# Patient Record
Sex: Female | Born: 1935
Health system: Southern US, Community
[De-identification: ages and names within clinical notes are randomized; demographics above are authoritative.]

## PROBLEM LIST (undated history)

## (undated) DIAGNOSIS — B029 Zoster without complications: Secondary | ICD-10-CM

## (undated) DIAGNOSIS — I350 Nonrheumatic aortic (valve) stenosis: Secondary | ICD-10-CM

## (undated) DIAGNOSIS — N3281 Overactive bladder: Secondary | ICD-10-CM

## (undated) DIAGNOSIS — M8580 Other specified disorders of bone density and structure, unspecified site: Secondary | ICD-10-CM

## (undated) DIAGNOSIS — E785 Hyperlipidemia, unspecified: Secondary | ICD-10-CM

## (undated) DIAGNOSIS — I4891 Unspecified atrial fibrillation: Secondary | ICD-10-CM

## (undated) DIAGNOSIS — K219 Gastro-esophageal reflux disease without esophagitis: Secondary | ICD-10-CM

## (undated) DIAGNOSIS — R7301 Impaired fasting glucose: Secondary | ICD-10-CM

## (undated) DIAGNOSIS — R0989 Other specified symptoms and signs involving the circulatory and respiratory systems: Secondary | ICD-10-CM

## (undated) DIAGNOSIS — R42 Dizziness and giddiness: Secondary | ICD-10-CM

## (undated) DIAGNOSIS — I809 Phlebitis and thrombophlebitis of unspecified site: Secondary | ICD-10-CM

## (undated) DIAGNOSIS — I8390 Asymptomatic varicose veins of unspecified lower extremity: Secondary | ICD-10-CM

## (undated) DIAGNOSIS — I10 Essential (primary) hypertension: Secondary | ICD-10-CM

## (undated) DIAGNOSIS — I493 Ventricular premature depolarization: Secondary | ICD-10-CM

## (undated) HISTORY — DX: Hyperlipidemia, unspecified: E78.5

## (undated) HISTORY — DX: Phlebitis and thrombophlebitis of unspecified site: I80.9

## (undated) HISTORY — PX: HAND SURGERY: SHX662

## (undated) HISTORY — DX: Overactive bladder: N32.81

## (undated) HISTORY — DX: Gastro-esophageal reflux disease without esophagitis: K21.9

## (undated) HISTORY — DX: Unspecified atrial fibrillation: I48.91

## (undated) HISTORY — DX: Zoster without complications: B02.9

## (undated) HISTORY — DX: Asymptomatic varicose veins of unspecified lower extremity: I83.90

## (undated) HISTORY — PX: HEMORRHOID SURGERY: SHX153

## (undated) HISTORY — DX: Other specified symptoms and signs involving the circulatory and respiratory systems: R09.89

## (undated) HISTORY — PX: FOOT SURGERY: SHX648

## (undated) HISTORY — DX: Nonrheumatic aortic (valve) stenosis: I35.0

## (undated) HISTORY — DX: Dizziness and giddiness: R42

## (undated) HISTORY — DX: Essential (primary) hypertension: I10

## (undated) HISTORY — DX: Gilbert syndrome: E80.4

## (undated) HISTORY — DX: Other specified disorders of bone density and structure, unspecified site: M85.80

## (undated) HISTORY — DX: Impaired fasting glucose: R73.01

## (undated) HISTORY — DX: Ventricular premature depolarization: I49.3

---

## 1961-01-09 HISTORY — PX: ABDOMINAL HYSTERECTOMY: SHX81

## 2004-10-19 HISTORY — PX: US ECHOCARDIOGRAPHY: HXRAD669

## 2005-07-20 HISTORY — PX: US ECHOCARDIOGRAPHY: HXRAD669

## 2005-07-25 ENCOUNTER — Inpatient Hospital Stay (HOSPITAL_BASED_OUTPATIENT_CLINIC_OR_DEPARTMENT_OTHER): Admission: RE | Admit: 2005-07-25 | Discharge: 2005-07-25 | Payer: Self-pay | Admitting: Cardiology

## 2005-07-25 HISTORY — PX: CARDIAC CATHETERIZATION: SHX172

## 2005-08-01 ENCOUNTER — Inpatient Hospital Stay (HOSPITAL_COMMUNITY): Admission: RE | Admit: 2005-08-01 | Discharge: 2005-08-11 | Payer: Self-pay | Admitting: Surgery

## 2005-08-01 HISTORY — PX: AORTIC VALVE REPLACEMENT: SHX41

## 2005-08-02 ENCOUNTER — Encounter (INDEPENDENT_AMBULATORY_CARE_PROVIDER_SITE_OTHER): Payer: Self-pay | Admitting: *Deleted

## 2005-11-21 HISTORY — PX: US ECHOCARDIOGRAPHY: HXRAD669

## 2008-01-10 HISTORY — PX: VARICOSE VEIN SURGERY: SHX832

## 2008-04-14 HISTORY — PX: US ECHOCARDIOGRAPHY: HXRAD669

## 2008-09-09 DIAGNOSIS — R0989 Other specified symptoms and signs involving the circulatory and respiratory systems: Secondary | ICD-10-CM

## 2008-09-09 HISTORY — DX: Other specified symptoms and signs involving the circulatory and respiratory systems: R09.89

## 2009-10-18 ENCOUNTER — Ambulatory Visit: Payer: Self-pay | Admitting: Cardiology

## 2010-03-02 ENCOUNTER — Other Ambulatory Visit: Payer: Self-pay | Admitting: Internal Medicine

## 2010-03-02 DIAGNOSIS — Z1231 Encounter for screening mammogram for malignant neoplasm of breast: Secondary | ICD-10-CM

## 2010-05-03 ENCOUNTER — Ambulatory Visit
Admission: RE | Admit: 2010-05-03 | Discharge: 2010-05-03 | Disposition: A | Discharge status: Home / Self Care | Payer: Medicare Other | Source: Ambulatory Visit | Attending: Internal Medicine | Admitting: Internal Medicine

## 2010-05-03 DIAGNOSIS — Z1231 Encounter for screening mammogram for malignant neoplasm of breast: Secondary | ICD-10-CM

## 2010-05-27 NOTE — H&P (Signed)
NAME:  Megan Pitts, Megan Pitts               ACCOUNT NO.:  [id number]   MEDICAL RECORD NO.:  [id number]           PATIENT TYPE:   LOCATION:                                 FACILITY:   PHYSICIAN:  Peter M. Swaziland, M.D.  DATE OF BIRTH:  1927-04-05   DATE OF ADMISSION:  07/25/2005  DATE OF DISCHARGE:                                HISTORY & PHYSICAL   HISTORY OF PRESENT ILLNESS:  Ms. Fariss is a 77 year old white female.  She  was initially evaluated in October 2006 for a new murmur.  She was found at  that time to have moderately severe aortic stenosis.  She was asymptomatic  at that time and we recommended continued follow-up.  However over the past  month, she has experienced symptoms of increasing dyspnea with exertion.  She has had no orthopnea, PND or edema.  Her symptoms have been particularly  noticeable over the past 2 weeks with dyspnea on walking or doing her house  chores. She also has been experiencing some chest tightness in her mid chest  radiating to her left arm and into her neck. With this, she has had a lot of  belching and she feels that her symptoms were primarily related to acid  reflux.  We did do a follow-up echocardiogram which showed progression of  her aortic stenosis which is now severe. Her peak gradient was 85 mmHg, mean  gradient of 45 mmHg and aortic valve area 0.8 cm2.  She has mild concentric  LVH with normal systolic function and mild to moderate aortic insufficiency.  Because of her progressive symptoms and findings on echo, we have  recommended cardiac catheterization with anticipation of needing aortic  valve replacement.   PAST MEDICAL HISTORY:  1.  Aortic stenosis.  2.  Hypertension.  3.  Hypercholesterolemia.  She has had a prior hysterectomy at age 74.  She      has had prior hemorrhoid surgery.  She has no known allergies.   CURRENT MEDICATIONS:  1.  Hyzaar 100/25 mg per day.  2.  Zocor 40 mg per day.  3.  Atenolol 25 mg per day.  4.  Aspirin 81 mg  daily.  5.  Nexium 40 mg per day.   SOCIAL HISTORY:  She is retired.  She previously worked for the [employer].  She is married. She denies tobacco or alcohol use.  She  has two children in good health.   FAMILY HISTORY:  Father died at age 77 with Alzheimer's disease.  Mother  died at age 60 with pneumonia.  One brother died at age 75 with some type of  blood disorder.  Two sisters are in good health.   REVIEW OF SYSTEMS:  No history of TIA or stroke.  No bleeding disorder.  No  claudication.  She denies any edema, orthopnea or PND.  She has had no  recent bowel or bladder complaints.   PHYSICAL EXAMINATION:  GENERAL:  The patient is a pleasant white female in  no distress.  VITAL SIGNS:  Weight is 155, blood pressure is  130/80, pulse 60 and regular.  Respirations are normal.  HEENT:  Normocephalic, atraumatic.  Pupils equal, round, reactive to light  and accommodation.  Extraocular movements are full. Oropharynx is clear.  NECK:  Supple without JVD, adenopathy, or thyromegaly. Carotid upstrokes  were delayed and diminished.  LUNGS:  Clear.  CARDIAC:  Exam reveals a harsh grade 3/6 systolic murmur heard best in the  aortic area radiating to the carotids.  There is no diastolic murmur or S3.  ABDOMEN:  Soft, nontender without masses or hepatosplenomegaly. Femoral and  pedal pulses were 2+ and symmetric.  She has no edema.  NEUROLOGIC:  Exam is nonfocal.   LABORATORY DATA:  ECG shows normal sinus rhythm with minor nonspecific ST  abnormality.  Chest x-ray is normal.   IMPRESSION:  1.  Severe aortic stenosis now symptomatic.  2.  Hypertension.  3.  Hypercholesterolemia.  4.  Possible symptoms of acid reflux disease.   PLAN:  Will undergo diagnostic right and left heart catheterization,  coronary angiography with further therapy pending these results.           ______________________________  Peter M. Swaziland, M.D.     PMJ/MEDQ  D:  07/20/2005  T:   07/20/2005  Job:  [id number]   cc:   Kari Baars, M.D.  Fax: [phone]

## 2010-05-27 NOTE — Op Note (Signed)
Megan Pitts, Megan Pitts                   ACCOUNT NO.:  [id number]   MEDICAL RECORD NO.:  [id number]          PATIENT TYPE:  INP   LOCATION:  2307                         FACILITY:  [facility]   PHYSICIAN:  Evelene Croon, M.D.     DATE OF BIRTH:  [birth year]   DATE OF PROCEDURE:  08/01/2005  DATE OF DISCHARGE:                                 OPERATIVE REPORT   PREOPERATIVE DIAGNOSIS:  Critical aortic stenosis.   POSTOPERATIVE DIAGNOSIS:  Critical aortic stenosis.   OPERATIVE PROCEDURE:  Median sternotomy, extracorporeal circulation, aortic  valve replacement using a 23 mm Toronto stentless porcine aortic valve.   ATTENDING SURGEON:  Evelene Croon, M.D.   ASSISTANT:  Salvatore Decent. Dorris Fetch, M.D.   ANESTHESIA:  General endotracheal.   CLINICAL HISTORY:  The patient is a 77 year old woman with history of aortic  stenosis that has been followed with serial echocardiograms.  She now  presents with progressive symptoms of shortness of breath and chest pain  with minimal exertion and echocardiogram has shown critical aortic stenosis.  She underwent catheterization which showed mild, nonobstructive coronary  disease.  Left ventricular function was well preserved.  Her aortic valve  area has been calculated at 0.4 cm2.  After review of the studies and  examination of the patient, I felt that proceeding with urgent aortic valve  replacement was the best treatment to prevent any complications of critical  aortic stenosis including sudden death.  The patient was admitted to the  hospital from the office and plans made to do surgery today.  I discussed  the operative procedure with the patient and her husband including use of a  tissue valve given her age of 37 years.  I discussed alternatives, benefits,  and risks of surgery including bleeding, blood transfusion, infection,  stroke, myocardial infarction, heart block requiring permanent pacemaker,  organ failure, and death.  She understood and agreed to  proceed.   OPERATIVE PROCEDURE:  The patient was taken to the operating room and placed  on the table in supine position.  After induction of general endotracheal  anesthesia, a Foley catheter was placed in the bladder using sterile  technique.  Then, the chest, abdomen and both lower extremities were prepped  and draped in usual sterile manner.  Transesophageal echocardiogram was  performed by anesthesiology.  This showed critical calcific aortic stenosis.  Left ventricular function appeared well preserved.  There was no significant  mitral regurgitation.   Then, the chest was opened through a median sternotomy incision and the  pericardium opened in the midline.  Examination of the heart showed good  ventricular contractility.  The transesophageal echocardiogram showed  moderate left ventricular hypertrophy.  The ascending aorta was normal size  for her body with her body surface area of 1.8.   The patient was heparinized and when an adequate activated clotting time was  achieved, the distal ascending aorta was cannulated using a 20-French aortic  cannula for arterial in flow.  Venous outflow was achieved using a two-stage  venous cannula through the right atrial appendage.  An antegrade  cardioplegia  and vent cannula was inserted in the aortic root.  A left  ventricular vent was placed through the right superior pulmonary vein.  A  retrograde cardioplegic cannula was inserted through the right atrium and  the coronary sinus.   The patient was placed on cardiopulmonary bypass.  The aorta was  crossclamped and 500 mL of cold blood antegrade cardioplegia was  administered in the aortic root with quick arrest the heart.  Systemic  hypothermia to 20 degrees centigrade and topical hypothermic with iced  saline was used.  A temperature probe was placed in the septum and an  insulating pad in the pericardium.  Additional doses of retrograde  cardioplegia were given at about 20 minute  intervals throughout the  procedure to maintain myocardial temperature around 10 degrees centigrade.   The aorta was then opened just above the sinotubular junction.  Examination  of the native valve showed that there were three leaflets with marked  calcification and immobility.  The coronary ostia were identified and were  not involved in this calcification.  The coronary ostia were in their normal  location and were lying far away from the aortic annulus.  The native valve  was excised.  Care was taken to remove all particulate debris.  The annulus  was decalcified with rongeurs.  The left ventricle was irrigated with iced  saline solution to remove any debris which had not been seen.  Then, the  annulus was sized.  A 21 mm pericardial sizer would not go into the annulus.  Given the patient's very functional active lifestyle, I felt that she would  be better served with a stentless valve than a 19 mm pericardial valve.  Her  annulus and sinotubular junction was then sized with the Toronto stentless  sizers.  A 23 mm valve was chosen.  This had model number SPA-101-23, serial  number [id number].  Then, a series of 4-0 Ethibond on sutures were placed  around the aortic annulus in a single plane.  The sutures were then placed  through the cuff of the stentless valve.  The valve was lowered in place and  sutures tied sequentially.  Then, the distal suture line was begun.  The  commissural posts were placed in their normal anatomic position.  They were  anchored to the aortic wall using 4-0 Prolene horizontal mattress suture  with pledgets outside the aorta.  Then, the 4-0 Prolene sutures were used to  run a continuous distal suture line attaching the stentless valve to the  aortic wall.  Care was taken around the coronary ostia so that no  obstruction would occur.  At the conclusion of this, the distal suture line  appeared watertight.  The valve leaflets were functioning normally.   The subvalvular area looked normal.  The right and left coronary ostia were not  obstructed.   The patient had been rewarmed to 37 degrees centigrade.  The aorta was  closed in two layers using continuous 4-0 Prolene suture.  The left side of  the heart was then de-aired and the head placed in a Trendelenburg position.  The crossclamp was removed with a time of 104 minutes.  There was  spontaneous return of sinus rhythm.  Two temporary right ventricular and  right atrial pacing wires were placed and brought through the skin.  When  the patient had been rewarmed to 37 degrees centigrade, she was weaned from  cardiopulmonary bypass on no inotropic agents.  Total bypass time was 129  minutes.  Cardiac function appeared good with a cardiac output of 4 liters  per minute.  Transesophageal echocardiogram showed normal functioning aortic  valve prosthesis with no aortic insufficiency or perivalvular leak.  There  was no mitral regurgitation.  Left ventricular function appeared well-  preserved.   Protamine was given and the venous and aortic cannulae were removed without  difficulty.  The retrograde cardioplegia and vent cannula were removed  without difficulty.  Hemostasis was achieved.  Two chest tubes were placed  with two in the posterior pericardium and one in the anterior mediastinum.  The sternum was then closed with #6 stainless steel wires.  The fascia was  closed with continuous #1 Vicryl suture.  The subcu tissue was closed with  continuous 2-0 Vicryl and the skin with a 3-0 Vicryl subcuticular closure.  The sponge, needle and instrument  counts were correct according to the scrub nurse.  Dry sterile dressings  were applied over the incisions and around chest tubes which were hooked to  Pleur-Evac suction.  The patient remained hemodynamically stable and was  transferred to the SICU in guarded but stable condition.      Evelene Croon, M.D.  Electronically Signed     BB/MEDQ   D:  08/02/2005  T:  08/02/2005  Job:  [id number]   cc:   Peter M. Swaziland, M.D.

## 2010-05-27 NOTE — Discharge Summary (Signed)
NAME:  Megan Pitts, Megan Pitts                   ACCOUNT NO.:  [id number]   MEDICAL RECORD NO.:  [id number]          PATIENT TYPE:  INP   LOCATION:  2014                         FACILITY:  [facility]   PHYSICIAN:  Jerold Coombe, P.A.DATE OF BIRTH:  20-Jan-1927   DATE OF ADMISSION:  08/01/2005  DATE OF DISCHARGE:                                 DISCHARGE SUMMARY   ADMISSION DIAGNOSES:  Critical aortic stenosis.   DISCHARGE/SECONDARY DIAGNOSES:  1.  Critical aortic stenosis status post aortic valve replacement.  2.  Postoperative atrial fibrillation with rapid ventricular response.  3.  Postoperative elevated liver function tests, believe secondary to      amiodarone which has since been discontinued.  4.  Bilateral upper extremity phlebitis believed secondary to amiodarone,      treated with Keflex.  5.  Postoperative nausea, resolved.  6.  Postoperative hypokalemia, improved.  7.  Hypertension.  8.  Hypercholesteremia.  9.  Hysterectomy.  10. Prior hemorrhoid surgery.  11. History of toe and foot surgery and history of hand surgery.  12. Gastroesophageal reflux disease.   ALLERGIES:  NO KNOWN DRUG ALLERGIES.   PROCEDURES:  1.  August 01, 2005 median sternotomy for aortic valve replacement using a 23      mm Toronto stentless porcine aortic valve by Dr. Melrose Nakayama.  2.  August 02, 2005 intraoperative transesophageal echocardiogram, see      dictated report for details.   BRIEF HISTORY:  Megan Pitts is a 77 year old female with history of aortic  stenosis which has been followed by echocardiograms.  Megan Pitts was seen by Dr.  Melrose Nakayama on August 01, 2005 with progressive symptoms of shortness of  breath and chest pain with normal exertion and echocardiogram confirmed  critical aortic stenosis.  Megan Pitts underwent catheterization on July 25, 2005  which showed mild nonobstructive coronary artery disease with well preserved  left ventricular function.  Her aortic valve area was calculated at 0.4 cm  squared.  Again, after we saw the patient in the office on August 01, 2005 Dr.  Lavinia Sharps felt that he should proceed with urgent aortic valve replacement to  prevent any complications of critical aortic stenosis including sudden  death.  After discussing risks, benefits and alternatives, Megan Pitts agreed to  proceed with admission on August 01, 2005 with plans for surgery on August 02, 2005.  Of note, Dr. Lavinia Sharps did discuss the use of tissue valves given her  age of 49.   HOSPITAL COURSE:  Megan Pitts was admitted to [facility] on August 01, 2005 with critical aortic stenosis.  Megan Pitts was taken to the operating room on  August 02, 2005 for aortic valve replacement.  Postoperatively, Megan Pitts was  transferred to the surgical intensive care unit and remained there through  postoperative day #5.  During her time in the unit, Megan Pitts required atrial  pacing due to sinus bradycardia.  This improved with time.  Megan Pitts was  extubated by the morning of postoperative day #1 neurologically intact.  Megan Pitts  did have mild fluid volume excess with weight of approximately  10 pounds  that did require short term diuretic therapy.  Chest tube was low  postoperative and was discontinued on postoperative day #1.  Megan Pitts did have  some postoperative hyperglycemia with sugars between 140 and 190 and did  require NovoLog insulin sliding scale.  Megan Pitts also had some postoperative  nausea that was persistent over several days and did require treatment with  Reglan, antiemetics and laxatives.  This too ultimately improved.  On  postoperative day #2 Megan Pitts did develop atrial fibrillation with a rapid  ventricular response with heart rate in the 180s.  Megan Pitts was started on beta  blocker therapy as well as IV amiodarone.  Megan Pitts remained on amiodarone for a  few days with initial response but then developed recurrent atrial  fibrillation at least one other time.  It was felt secondary to the  amiodarone Megan Pitts developed an increase in her liver function tests  with AST up  to 206, ALT of 240, and total bilirubin up to 2.1.  Subsequently her  amiodarone was discontinued.  At that time Megan Pitts was back in normal sinus  rhythm but was in a day or so developed recurrent atrial fibrillation and  was treated with Cardizem.  Upon this dictation Megan Pitts is currently back in  normal sinus rhythm.  Of note, Megan Pitts also had leukocytosis with white count up  to 25,000.  Chest x-ray revealed no evidence of infiltrate with mild left  basal atelectasis.  However, it was noted that Megan Pitts had developed bilateral  upper extremity phlebitis which was also felt secondary to IV amiodarone  which had presumably infiltrated.  This was treated with Ancef initially,  then transitioned to oral Keflex.  Upon dictation, her white count is now  decreased over the several days and is currently 15,000.  Her upper  extremities are also showing decreasing erythema.  Megan Pitts was transferred out  of surgical intensive care unit.  The patient was remained stable.  Megan Pitts was  making good progress overall but Megan Pitts should remain hospitalized for a few  additional days to further monitor her heart rate and upper extremity  phlebitis.  At the time her nausea had resolved and Megan Pitts had been tolerating  regular diet.  Her sugars overall were improving primarily now ranging 110  to 160.  Her pain was controlled on oral medications.  Megan Pitts was ambulating  with cardiac rehab with the use of a rolling walker.  Megan Pitts has arrhythmias  for 24 hours.  Megan Pitts has had no further arrhythmias on Cardizem and beta  blocker therapy.  Vitals were stable, primarily with her blood pressure  ranging from 111/66 all the way up to 153/68 with elevations intermittent.  Further adjustments to her medications will be considered if Megan Pitts has  persistent hypertension.  But Megan Pitts has started on her home medications.  Megan Pitts  is to be watched for the next 24 hours and it is felt that Megan Pitts will be ready  for discharge on postoperative day #9.  Her  most recent labs showed decrease in white blood count of 2,011,  hematocrit 33, platelets 415.  Sodium 138, potassium 3.4 which was up a  little, chloride 104, CO2 27, blood glucose 120, BUN 9, creatinine 0.9,  calcium 8.6.  Total bilirubin 2.1, direct bilirubin 2.4, indirect bilirubin  1.7, alkaline phosphatase 108, AST and ALT decreased at 80 and 175  respectively.  Total protein 4.7 and albumin 2.0.  Amylase 28.  On August 05, 2005 showing amylase of 63.  Of note her preoperative liver function tests  showed a bilirubin of 1.2, alkaline phosphatase 72, AST 23.  Prior to  discharge labs were reviewed.   Her incisions were healing well without signs of infection.  Her heart had a  regular rate and rhythm.  Lung sounds were clear.  Extremities showed no  lower extremity edema and upper extremity showed decreasing erythema around  her area of phlebitis.   SECONDARY DIAGNOSES:  Postoperative hyperglycemia, mild.   DISCHARGE MEDICATIONS:  1.  Enteric coated aspirin 325 mg p.o. daily.  2.  Lopressor 25 mg p.o. q. 8 hours.  3.  Crestor 20 mg p.o. daily.  4.  Keflex 500 mg p.o. t.i.d. x1 more week.  5.  Cardizem CD 240 mg p.o. daily.  6.  Hyzaar 100/25 mg p.o. daily.  7.  Nexium 40 mg p.o. daily.  8.  Ultram 50 mg 1-2 p.o. q. 4-6 hours p.r.n. pain.   DISCHARGE INSTRUCTIONS:  Megan Pitts is instructed to avoid driving, heavy lifting  more than 10 pounds.  Megan Pitts is encouraged to continue walking.  Megan Pitts is to  followup as needed.  Megan Pitts may shower and clean her incisions with soap and  water.  Megan Pitts is to return if Megan Pitts develops fever greater than 101 or redness  or drainage around her incision sites or worsening or increasing erythema  from her upper extremity phlebitis site.   DISCHARGE FOLLOWUP:  Megan Pitts is to followup with Dr. Peter Swaziland in 2 weeks  with a chest x-ray and should call to schedule an appointment.  Megan Pitts should  see her primary care physician in 3 weeks and bring her most recent chest x-   ray with her.  The office will contact her regarding specific appointment  date and time.  Megan Pitts may call sooner if needed.      Jerold Coombe, P.A.     AWZ/MEDQ  D:  08/10/2005  T:  08/10/2005  Job:  [id number]   cc:   Peter M. Swaziland, M.D.  Kari Baars, M.D.

## 2010-05-27 NOTE — Cardiovascular Report (Signed)
NAME:  Megan Pitts, Megan Pitts                   ACCOUNT NO.:  [id number]   MEDICAL RECORD NO.:  [id number]          PATIENT TYPE:  OIB   LOCATION:  1963                         FACILITY:  [facility]   PHYSICIAN:  Peter M. Swaziland, M.D.  DATE OF BIRTH:  25-May-1927   DATE OF PROCEDURE:  07/25/2005  DATE OF DISCHARGE:                              CARDIAC CATHETERIZATION   INDICATIONS FOR PROCEDURE:  A 77 year old white female with history of  aortic stenosis presents with increased symptoms of dyspnea on exertion and  atypical chest pain. Prior echocardiogram showed severe aortic stenosis.  The valve area estimated 0.8 cm2.   PROCEDURES:  Right and left heart catheterization, coronary and left  ventricular angiography.   ACCESS:  Is via the right femoral artery and the left femoral vein.  We were  unable to access the right femoral vein.   EQUIPMENT:  A 5-French 4 cm left Judkins catheter, 5-French 4 cm right  Judkins catheter, 5-French pigtail catheter, 5-French arterial sheath, 6-  French venous sheath, and a balloon-tip Swan-Ganz catheter.   CONTRAST:  160 mL of Omnipaque.   HEMODYNAMIC DATA:  Right atrial pressure 6/3 with a mean of 2 mmHg. Right  ventricle pressures 25 with EDP of 5 mmHg.  Pulmonary pressure is 25/5 with  a mean of 13 mmHg.  Pulmonary capillary wedge pressures 9/7 with a mean of 6  mmHg.  Aortic pressure is 153/61 with a mean of 91 mmHg. Left ventricle  pressure was 188 with EDP of 16 mmHg. Aortic valve gradient is 39 mmHg peak  with a mean gradient 31 mmHg.  Calculated valve area is 0.43 cm2.  There is  no significant mitral valve gradient.  Cardiac output by Fick 2.1 liters per  minute with an index 1.21. By thermodilution cardiac outputs 2.3 with an  index of 1.33.   ANGIOGRAPHIC DATA:  Left ventricular angiography performed in RAO view  demonstrates normal left ventricular size and contractility with normal  systolic function.  Ejection fraction estimated 60%.  The aortic  valve is  heavily calcified with reduced opening. There is no significant mitral valve  prolapse.  Aortic root is minimally dilated.   CORONARY ANGIOGRAPHY:  Left coronary artery arises and distributes normally.  Left main coronary is normal.   There is moderate calcification proximal LAD with diffuse irregularities up  to 20-30%.  The mid to distal LAD is without significant disease.   The left circumflex coronary has only mild narrowing in the ostium  approximately 10%.   The right coronary arises and distributes normally.  It has scattered  irregularities throughout the proximal and mid vessel to 20%.   FINAL INTERPRETATION:  1.  Critical aortic stenosis.  2.  Mild nonobstructive atherosclerotic coronary disease.  3.  Normal right heart pressures.   PLAN:  I would recommend aortic valve replacement.           ______________________________  Peter M. Swaziland, M.D.     PMJ/MEDQ  D:  07/25/2005  T:  07/25/2005  Job:  [id number]   cc:   Kari Baars,  M.D.  Fax: [phone]   Cassell Clement, M.D.  Fax: [phone]

## 2010-05-27 NOTE — Op Note (Signed)
Megan Pitts, Megan Pitts                   ACCOUNT NO.:  [id number]   MEDICAL RECORD NO.:  [id number]          PATIENT TYPE:  INP   LOCATION:  2307                         FACILITY:  [facility]   PHYSICIAN:  Kaylyn Layer. Michelle Piper, M.D.   DATE OF BIRTH:  10/13/1927   DATE OF PROCEDURE:  08/02/2005  DATE OF DISCHARGE:                                 OPERATIVE REPORT   PROCEDURE PERFORMED:  Transesophageal echocardiographic probe insertion  monitoring during the aortic valve replacement.   DESCRIPTION OF PROCEDURE:  I was consulted by Dr. Evelene Croon for placement  of transesophageal echo probe for Mr. Sebring who is a patient with critical  aortic stenosis and congestive heart failure, presents for aortic valve  replacement.   After uneventful induction of anesthesia and endotracheal intubation, the  transesophageal echo probe was easily into the stomach.  Initial short-axis  view of the left ventricle showed left ventricular hypertrophy with no wall  motion abnormality.   Turning to the mitral valve, the mitral valve structures looked normal in  all views.  When I placed the color-flow Doppler across the valve, there was  no sign of mitral regurgitation.  The left ventricular outflow tract looked  normal, but trace of aortic insufficiency could be seen.   Turning to the aortic valve, the valve was heavily calcified.  It was  trileaflet with only the left cusp free due to the asymmetric view of the  valve, as well as rotation from LVH.  Telemetry was not accurate.  On  placing color-flow Doppler across the valve, in the lateral view, a trace to  1+ aortic insufficiency could be seen and super valve of the aortic valve  shows turbulence.  Measurement of the valve annulus was 2.37 cm.  Atrial  septum was noted.  Left atrium was normal.  Right side of the heart was  normal.  There was some tricuspid regurgitation commencing with the presence  with pulmonary artery catheter.   The patient was placed on  coronary artery bypass by Dr. Laneta Simmers and underwent  aortic valve replacement with a #23 [employer] valve and prior to  discontinuing coronary artery bypass, evaluation of the heart showed minimal  intracardiac air.   The patient was then discontinued from coronary artery bypass with minimal  inotrope and at this time evaluation showed left ventricular hypertrophy to  be present, abnormal wall motion abnormalities.  The mitral valve was  unchanged, structurally normal, and there was no evidence of mitral  regurgitation.  Left atrium was normal.  Right side of the heart was none.  Intra-atrial septum normal.  The prosthetic aortic valve could be seen in  the aortic valve root and when placing color-flow Doppler across the valve  in the longitudinal view, there was a small amount of supravalvular  turbulence, otherwise the valve seemed to operate normally.   At the end of the procedure, the transesophageal echocardiogram was removed  from the patient and she taken to intensive care in stable condition.           ______________________________  Kaylyn Layer  Michelle Piper, M.D.     KDO/MEDQ  D:  08/03/2005  T:  08/04/2005  Job:  [id number]

## 2010-06-30 ENCOUNTER — Other Ambulatory Visit: Payer: Self-pay | Admitting: Dermatology

## 2010-07-15 ENCOUNTER — Other Ambulatory Visit: Payer: Self-pay | Admitting: *Deleted

## 2010-07-15 DIAGNOSIS — I10 Essential (primary) hypertension: Secondary | ICD-10-CM

## 2010-07-15 MED ORDER — ATENOLOL 25 MG PO TABS: 25.0000 mg | ORAL_TABLET | Freq: Every day | ORAL | Status: DC

## 2010-07-15 NOTE — Telephone Encounter (Signed)
Refilled atenolol

## 2010-07-28 ENCOUNTER — Encounter (INDEPENDENT_AMBULATORY_CARE_PROVIDER_SITE_OTHER): Payer: Medicare Other

## 2010-07-28 DIAGNOSIS — M79609 Pain in unspecified limb: Secondary | ICD-10-CM

## 2010-08-02 NOTE — Procedures (Unsigned)
DUPLEX DEEP VENOUS EXAM - LOWER EXTREMITY  INDICATION:  Right lower extremity pain.  HISTORY:  Edema:  No Trauma/Surgery:  No Pain:  Yes PE:  No Previous DVT:  No Anticoagulants:  No Other:  DUPLEX EXAM:               CFV   SFV   PopV  PTV    GSV               R  L  R  L  R  L  R   L  R   L Thrombosis    o  o  o  o     o         n/v Spontaneous   +  +  +     +     + Phasic        +  +  +     +     + Augmentation  +  +  +     +     + Compressible  +  +  +     +     + Competent     +  +  +     +  Legend:  + - yes  o - no  p - partial  D - decreased   IMPRESSION: 1. No evidence of deep vein thrombosis identified involving the right     lower extremity. 2. The right GSV is not visualized.  Patient states she has had vein     treatments at an outside facility. 3. Preliminary results were called and verbally given to Willis Modena,     FNP at [facility].        _____________________________ V. Charlena Cross, MD  SH/MEDQ  D:  07/28/2010  T:  07/28/2010  Job:  [id number]

## 2010-10-20 ENCOUNTER — Encounter: Payer: Self-pay | Admitting: Cardiology

## 2010-10-26 ENCOUNTER — Ambulatory Visit (INDEPENDENT_AMBULATORY_CARE_PROVIDER_SITE_OTHER): Payer: Medicare Other | Admitting: Cardiology

## 2010-10-26 ENCOUNTER — Encounter: Payer: Self-pay | Admitting: Cardiology

## 2010-10-26 VITALS — BP 118/61 | HR 63 | Ht 63.0 in | Wt 160.8 lb

## 2010-10-26 DIAGNOSIS — I493 Ventricular premature depolarization: Secondary | ICD-10-CM | POA: Insufficient documentation

## 2010-10-26 DIAGNOSIS — I10 Essential (primary) hypertension: Secondary | ICD-10-CM

## 2010-10-26 DIAGNOSIS — I4949 Other premature depolarization: Secondary | ICD-10-CM

## 2010-10-26 DIAGNOSIS — E7800 Pure hypercholesterolemia, unspecified: Secondary | ICD-10-CM

## 2010-10-26 DIAGNOSIS — I350 Nonrheumatic aortic (valve) stenosis: Secondary | ICD-10-CM

## 2010-10-26 DIAGNOSIS — I359 Nonrheumatic aortic valve disorder, unspecified: Secondary | ICD-10-CM

## 2010-10-26 DIAGNOSIS — Z952 Presence of prosthetic heart valve: Secondary | ICD-10-CM | POA: Insufficient documentation

## 2010-10-26 DIAGNOSIS — E785 Hyperlipidemia, unspecified: Secondary | ICD-10-CM | POA: Insufficient documentation

## 2010-10-26 NOTE — Assessment & Plan Note (Signed)
Blood pressure is in excellent control. We will continue her current therapy.

## 2010-10-26 NOTE — Patient Instructions (Signed)
Take an antihistamine for your sinuses. (Claritin, Allegra, benadryl, chloricidin)  Avoid pseudophedrine.  I will see you again in 1 year.

## 2010-10-26 NOTE — Assessment & Plan Note (Signed)
Her bowel function is normal by exam. She is asymptomatic. Her last echocardiogram 2 years ago showed good valve function. We will continue her current therapy and she will need SBE prophylaxis.

## 2010-10-26 NOTE — Progress Notes (Signed)
Megan Pitts Date of Birth: [birth year] Medical Record #[id number]  History of Present Illness: Megan Pitts is seen for yearly followup today. She has a history of severe aortic stenosis and is status post aortic valve replacement with a #23 mm [place] stentless porcine valve in July of 2007. She has done her well over the past year. She denies any significant shortness of breath, chest pain, or palpitations. She remains active. She is working part-time. She is still very much grieving over the loss of her husband this year.  Current Outpatient Prescriptions on File Prior to Visit  Medication Sig Dispense Refill  . amLODipine (NORVASC) 5 MG tablet Take 5 mg by mouth daily. 1/2 daily      . aspirin 81 MG tablet Take 81 mg by mouth daily.        Marland Kitchen atenolol (TENORMIN) 25 MG tablet Take 1 tablet (25 mg total) by mouth daily.  30 tablet  5  . losartan-hydrochlorothiazide (HYZAAR) 100-25 MG per tablet Take 1 tablet by mouth daily.        . rosuvastatin (CRESTOR) 40 MG tablet Take 40 mg by mouth daily.          No Known Allergies  Past Medical History  Diagnosis Date  . Aortic stenosis   . PVC's (premature ventricular contractions)   . Hypertension   . Hyperlipidemia   . Phlebitis     UPPER EXTREMITY    Past Surgical History  Procedure Date  . Cardiac catheterization 07/25/2005    EF 60% THE AORTIC VALVE IS HEAVILY CALCIFIED WITH REDUCED OPENING. NO MVP  . Aortic valve replacement 08/01/2005    WITH A #23MM TORONTO STENTLESS PORCINE AORTIC VALVE  . Hemorrhoid surgery   . US echocardiography 04/14/2008    EF 55-60%. NORMAL  . US echocardiography 11/21/2005    EF 55-60%  . US echocardiography 07/20/2005    EF 55-60%  . US echocardiography 10/19/2004    EF 55-60%  . Abdominal hysterectomy   . Hemorrhoid surgery     History  Smoking status  . Never Smoker   Smokeless tobacco  . Not on file    History  Alcohol Use No    Family History  Problem Relation Age of Onset  . Pneumonia  Mother   . Alzheimer's disease Father   . Leukemia Brother     Review of Systems: As noted in history of present illness.  All other systems were reviewed and are negative.  Physical Exam: BP 118/61  Pulse 63  Ht 5\' 3"  (1.6 m)  Wt 160 lb 12.8 oz (72.938 kg)  BMI 28.48 kg/m2 The patient is alert and oriented x 3.  The mood and affect are normal.  The skin is warm and dry.  Color is normal.  The HEENT exam reveals that the sclera are nonicteric.  The mucous membranes are moist.  The carotids are 2+ without bruits.  There is no thyromegaly.  There is no JVD.  The lungs are clear.  The chest wall is non tender.  The heart exam reveals a regular rate with a normal S1 and S2.  There are no  gallops or rubs.  There is a grade 1/6 systolic ejection murmur in the right upper sternal border. The PMI is not displaced.   Abdominal exam reveals good bowel sounds.  There is no guarding or rebound.  There is no hepatosplenomegaly or tenderness.  There are no masses.  Exam of the legs reveal no clubbing, cyanosis,  or edema.  The legs are without rashes.  The distal pulses are intact.  Cranial nerves II - XII are intact.  Motor and sensory functions are intact.  The gait is normal.  LABORATORY DATA: ECG today demonstrates normal sinus rhythm with a normal ECG.  Assessment / Plan:

## 2011-01-13 ENCOUNTER — Other Ambulatory Visit: Payer: Self-pay | Admitting: Cardiology

## 2011-03-01 DIAGNOSIS — I10 Essential (primary) hypertension: Secondary | ICD-10-CM | POA: Diagnosis not present

## 2011-03-01 DIAGNOSIS — R7301 Impaired fasting glucose: Secondary | ICD-10-CM | POA: Diagnosis not present

## 2011-03-01 DIAGNOSIS — M949 Disorder of cartilage, unspecified: Secondary | ICD-10-CM | POA: Diagnosis not present

## 2011-03-01 DIAGNOSIS — R82998 Other abnormal findings in urine: Secondary | ICD-10-CM | POA: Diagnosis not present

## 2011-03-01 DIAGNOSIS — M899 Disorder of bone, unspecified: Secondary | ICD-10-CM | POA: Diagnosis not present

## 2011-03-01 DIAGNOSIS — E785 Hyperlipidemia, unspecified: Secondary | ICD-10-CM | POA: Diagnosis not present

## 2011-03-08 ENCOUNTER — Encounter: Payer: Self-pay | Admitting: Internal Medicine

## 2011-03-08 DIAGNOSIS — R7301 Impaired fasting glucose: Secondary | ICD-10-CM | POA: Diagnosis not present

## 2011-03-08 DIAGNOSIS — I10 Essential (primary) hypertension: Secondary | ICD-10-CM | POA: Diagnosis not present

## 2011-03-08 DIAGNOSIS — E785 Hyperlipidemia, unspecified: Secondary | ICD-10-CM | POA: Diagnosis not present

## 2011-03-08 DIAGNOSIS — Z0000 Encounter for general adult medical examination without abnormal findings: Secondary | ICD-10-CM | POA: Diagnosis not present

## 2011-03-13 ENCOUNTER — Ambulatory Visit: Payer: Medicare Other | Admitting: Internal Medicine

## 2011-03-14 ENCOUNTER — Other Ambulatory Visit: Payer: Self-pay | Admitting: Cardiology

## 2011-03-31 ENCOUNTER — Ambulatory Visit (INDEPENDENT_AMBULATORY_CARE_PROVIDER_SITE_OTHER): Payer: Medicare Other | Admitting: Internal Medicine

## 2011-03-31 ENCOUNTER — Encounter: Payer: Self-pay | Admitting: Internal Medicine

## 2011-03-31 VITALS — BP 112/52 | HR 64 | Ht 63.0 in | Wt 162.0 lb

## 2011-03-31 DIAGNOSIS — Z1211 Encounter for screening for malignant neoplasm of colon: Secondary | ICD-10-CM | POA: Diagnosis not present

## 2011-03-31 MED ORDER — PEG-KCL-NACL-NASULF-NA ASC-C 100 G PO SOLR: 1.0000 | Freq: Once | ORAL | Status: DC

## 2011-03-31 NOTE — Progress Notes (Signed)
Subjective:    Patient ID: Megan Pitts, female    DOB: 17-Jun-1927, 78 y.o.   MRN: [id number]  HPI The patient is a delightful 78 year old woman here to discuss whether she should have a screening colonoscopy. The last one she had was in [facility], and was okay. We don't have those records. She's never had any colon polyps, she think she had one other colonoscopy.  She has no active GI complaints.  She is very functional, she drives, she worked part time at a [employer], and she has no exertional dyspnea orthopnea or other significant functional limitations as far as I can tell. No Known Allergies Outpatient Prescriptions Prior to Visit  Medication Sig Dispense Refill  . amLODipine (NORVASC) 5 MG tablet Take 5 mg by mouth daily. 1/2 daily      . aspirin 81 MG tablet Take 81 mg by mouth daily.        Marland Kitchen atenolol (TENORMIN) 25 MG tablet TAKE 1 TABLET ONCE DAILY.  30 tablet  12  . benzoyl peroxide-erythromycin (BENZAMYCIN) gel Apply topically at bedtime.      Marland Kitchen losartan-hydrochlorothiazide (HYZAAR) 100-25 MG per tablet Take 1 tablet by mouth daily.        Marland Kitchen omeprazole (PRILOSEC) 20 MG capsule Take 20 mg by mouth as needed.      . rosuvastatin (CRESTOR) 40 MG tablet Take 40 mg by mouth daily.        . Sulfacetamide-Sulfur-Sunscreen (ROSAC) 10-5 % CREA Apply topically. Apply to face twice a day as needed      . zoster vaccine live, PF, (ZOSTAVAX) 40981 UNT/0.65ML injection Inject 0.65 mLs into the skin once.       Past Medical History  Diagnosis Date  . Aortic stenosis     s/p AVR 07/2005(porcine)  . PVC's (premature ventricular contractions)   . Hypertension   . Hyperlipidemia   . Phlebitis     UPPER EXTREMITY  . A-fib   . GERD (gastroesophageal reflux disease)   . Gilbert's syndrome   . Shingles   . IFG (impaired fasting glucose)   . OAB (overactive bladder)   . Osteopenia   . Varicose veins     s/p treatment   . Carotid bruit 09/2008    <50% B  .  Vertigo    Past Surgical History  Procedure Date  . Cardiac catheterization 07/25/2005    EF 60% THE AORTIC VALVE IS HEAVILY CALCIFIED WITH REDUCED OPENING. NO MVP  . Aortic valve replacement 08/01/2005    WITH A #23MM TORONTO STENTLESS PORCINE AORTIC VALVE  . US echocardiography 04/14/2008    EF 55-60%. NORMAL  . US echocardiography 11/21/2005    EF 55-60%  . US echocardiography 07/20/2005    EF 55-60%  . US echocardiography 10/19/2004    EF 55-60%  . Abdominal hysterectomy 1963    partial  . Hemorrhoid surgery 1980's  . Foot surgery   . Hand surgery   . Varicose vein surgery 2010    laser treatment          Review of Systems As above. All other review of systems negative.    Objective:   Physical Exam General:  NAD Eyes:   anicteric Lungs:  clear Heart:  S1S2 no rubs,  or gallops 2/6 SEM best at RUSB Abdomen:  soft and nontender, BS+ Ext:   no edema    Data Reviewed: Labs from primary care and February. Her CBC was normal.  Assessment & Plan:   1. Special screening for malignant neoplasms, colon    She is 78 but very functional. It sounds like it's been 10 years since her last colonoscopy. I explained the risks benefits and indications of screening colonoscopy. Given how functional she is, it is reasonable for her to pursue a colonoscopy. I told her that I leaned that way. She decided to follow that recommendation.

## 2011-03-31 NOTE — Patient Instructions (Signed)
You have been scheduled for a colonoscopy. Please follow written instructions given to you at your visit today.  Please pick up your prep kit at the pharmacy within the next 1-3 days.  

## 2011-04-20 DIAGNOSIS — G5760 Lesion of plantar nerve, unspecified lower limb: Secondary | ICD-10-CM | POA: Diagnosis not present

## 2011-04-20 DIAGNOSIS — M2040 Other hammer toe(s) (acquired), unspecified foot: Secondary | ICD-10-CM | POA: Diagnosis not present

## 2011-05-04 DIAGNOSIS — G5760 Lesion of plantar nerve, unspecified lower limb: Secondary | ICD-10-CM | POA: Diagnosis not present

## 2011-05-17 ENCOUNTER — Other Ambulatory Visit: Payer: Self-pay | Admitting: Orthopedic Surgery

## 2011-05-17 DIAGNOSIS — M79641 Pain in right hand: Secondary | ICD-10-CM

## 2011-05-17 DIAGNOSIS — S55809A Unspecified injury of other blood vessels at forearm level, unspecified arm, initial encounter: Secondary | ICD-10-CM | POA: Diagnosis not present

## 2011-05-17 DIAGNOSIS — M6740 Ganglion, unspecified site: Secondary | ICD-10-CM | POA: Diagnosis not present

## 2011-05-18 DIAGNOSIS — G5760 Lesion of plantar nerve, unspecified lower limb: Secondary | ICD-10-CM | POA: Diagnosis not present

## 2011-05-19 ENCOUNTER — Other Ambulatory Visit: Payer: Medicare Other | Admitting: Internal Medicine

## 2011-05-24 DIAGNOSIS — H40059 Ocular hypertension, unspecified eye: Secondary | ICD-10-CM | POA: Diagnosis not present

## 2011-05-25 ENCOUNTER — Ambulatory Visit
Admission: RE | Admit: 2011-05-25 | Discharge: 2011-05-25 | Disposition: A | Discharge status: Home / Self Care | Payer: Medicare Other | Source: Ambulatory Visit | Attending: Orthopedic Surgery | Admitting: Orthopedic Surgery

## 2011-05-25 DIAGNOSIS — M79641 Pain in right hand: Secondary | ICD-10-CM

## 2011-05-25 DIAGNOSIS — M7989 Other specified soft tissue disorders: Secondary | ICD-10-CM | POA: Diagnosis not present

## 2011-05-25 DIAGNOSIS — M19049 Primary osteoarthritis, unspecified hand: Secondary | ICD-10-CM | POA: Diagnosis not present

## 2011-05-31 DIAGNOSIS — M6740 Ganglion, unspecified site: Secondary | ICD-10-CM | POA: Diagnosis not present

## 2011-05-31 DIAGNOSIS — S55809A Unspecified injury of other blood vessels at forearm level, unspecified arm, initial encounter: Secondary | ICD-10-CM | POA: Diagnosis not present

## 2011-06-01 DIAGNOSIS — G5760 Lesion of plantar nerve, unspecified lower limb: Secondary | ICD-10-CM | POA: Diagnosis not present

## 2011-06-14 DIAGNOSIS — G5760 Lesion of plantar nerve, unspecified lower limb: Secondary | ICD-10-CM | POA: Diagnosis not present

## 2011-06-21 DIAGNOSIS — G5760 Lesion of plantar nerve, unspecified lower limb: Secondary | ICD-10-CM | POA: Diagnosis not present

## 2011-06-21 DIAGNOSIS — E7800 Pure hypercholesterolemia, unspecified: Secondary | ICD-10-CM | POA: Diagnosis not present

## 2011-06-29 DIAGNOSIS — L719 Rosacea, unspecified: Secondary | ICD-10-CM | POA: Diagnosis not present

## 2011-06-29 DIAGNOSIS — D239 Other benign neoplasm of skin, unspecified: Secondary | ICD-10-CM | POA: Diagnosis not present

## 2011-06-29 DIAGNOSIS — L821 Other seborrheic keratosis: Secondary | ICD-10-CM | POA: Diagnosis not present

## 2011-06-29 DIAGNOSIS — Z85828 Personal history of other malignant neoplasm of skin: Secondary | ICD-10-CM | POA: Diagnosis not present

## 2011-07-10 DIAGNOSIS — M6740 Ganglion, unspecified site: Secondary | ICD-10-CM | POA: Diagnosis not present

## 2011-07-10 DIAGNOSIS — S55809A Unspecified injury of other blood vessels at forearm level, unspecified arm, initial encounter: Secondary | ICD-10-CM | POA: Diagnosis not present

## 2011-07-10 DIAGNOSIS — M19049 Primary osteoarthritis, unspecified hand: Secondary | ICD-10-CM | POA: Diagnosis not present

## 2011-07-18 DIAGNOSIS — M6740 Ganglion, unspecified site: Secondary | ICD-10-CM | POA: Diagnosis not present

## 2011-07-18 DIAGNOSIS — S55809A Unspecified injury of other blood vessels at forearm level, unspecified arm, initial encounter: Secondary | ICD-10-CM | POA: Diagnosis not present

## 2011-07-18 DIAGNOSIS — M19049 Primary osteoarthritis, unspecified hand: Secondary | ICD-10-CM | POA: Diagnosis not present

## 2011-07-25 DIAGNOSIS — S55809A Unspecified injury of other blood vessels at forearm level, unspecified arm, initial encounter: Secondary | ICD-10-CM | POA: Diagnosis not present

## 2011-07-25 DIAGNOSIS — M6740 Ganglion, unspecified site: Secondary | ICD-10-CM | POA: Diagnosis not present

## 2011-07-25 DIAGNOSIS — M19049 Primary osteoarthritis, unspecified hand: Secondary | ICD-10-CM | POA: Diagnosis not present

## 2011-10-10 DIAGNOSIS — R229 Localized swelling, mass and lump, unspecified: Secondary | ICD-10-CM | POA: Diagnosis not present

## 2011-10-10 DIAGNOSIS — IMO0002 Reserved for concepts with insufficient information to code with codable children: Secondary | ICD-10-CM | POA: Diagnosis not present

## 2011-10-11 DIAGNOSIS — B351 Tinea unguium: Secondary | ICD-10-CM | POA: Diagnosis not present

## 2011-10-11 DIAGNOSIS — Z23 Encounter for immunization: Secondary | ICD-10-CM | POA: Diagnosis not present

## 2011-10-11 DIAGNOSIS — E785 Hyperlipidemia, unspecified: Secondary | ICD-10-CM | POA: Diagnosis not present

## 2011-10-11 DIAGNOSIS — I10 Essential (primary) hypertension: Secondary | ICD-10-CM | POA: Diagnosis not present

## 2011-10-26 ENCOUNTER — Ambulatory Visit: Payer: Medicare Other | Admitting: Cardiology

## 2011-11-09 DIAGNOSIS — H40059 Ocular hypertension, unspecified eye: Secondary | ICD-10-CM | POA: Diagnosis not present

## 2011-11-14 ENCOUNTER — Encounter: Payer: Self-pay | Admitting: Cardiology

## 2011-11-14 ENCOUNTER — Ambulatory Visit (INDEPENDENT_AMBULATORY_CARE_PROVIDER_SITE_OTHER): Payer: Medicare Other | Admitting: Cardiology

## 2011-11-14 VITALS — BP 142/76 | HR 51 | Ht 62.0 in | Wt 161.4 lb

## 2011-11-14 DIAGNOSIS — I10 Essential (primary) hypertension: Secondary | ICD-10-CM

## 2011-11-14 DIAGNOSIS — I359 Nonrheumatic aortic valve disorder, unspecified: Secondary | ICD-10-CM

## 2011-11-14 DIAGNOSIS — I4949 Other premature depolarization: Secondary | ICD-10-CM | POA: Diagnosis not present

## 2011-11-14 DIAGNOSIS — Z954 Presence of other heart-valve replacement: Secondary | ICD-10-CM

## 2011-11-14 DIAGNOSIS — I493 Ventricular premature depolarization: Secondary | ICD-10-CM

## 2011-11-14 DIAGNOSIS — E785 Hyperlipidemia, unspecified: Secondary | ICD-10-CM

## 2011-11-14 DIAGNOSIS — Z952 Presence of prosthetic heart valve: Secondary | ICD-10-CM

## 2011-11-14 NOTE — Patient Instructions (Signed)
Continue your current therapy  I will see you again in 6 months.   

## 2011-11-15 NOTE — Progress Notes (Signed)
[name] Date of Birth: 1927-02-02 Medical Record #[id number]  History of Present Illness: Megan Pitts is seen for yearly followup today. She has a history of severe aortic stenosis and is status post aortic valve replacement with a #23 mm [place] stentless porcine valve in July of 2007. She reports she has done very well over the past year. She was started on a medication for fungal infection of her nails. Her amlodipine was discontinued but then her blood pressure shot up to 170. She stop taking the antifungal and resumed her amlodipine improvement in her blood pressure control. She denies any chest pain or shortness of breath. She's had no palpitations.  Current Outpatient Prescriptions on File Prior to Visit  Medication Sig Dispense Refill  . amLODipine (NORVASC) 5 MG tablet Take 5 mg by mouth daily. 1/2 daily      . aspirin 81 MG tablet Take 81 mg by mouth daily.        Marland Kitchen atenolol (TENORMIN) 25 MG tablet TAKE 1 TABLET ONCE DAILY.  30 tablet  12  . losartan-hydrochlorothiazide (HYZAAR) 100-25 MG per tablet Take 1 tablet by mouth daily.        Marland Kitchen omeprazole (PRILOSEC) 20 MG capsule Take 20 mg by mouth as needed.      . rosuvastatin (CRESTOR) 40 MG tablet Take 40 mg by mouth daily.          No Known Allergies  Past Medical History  Diagnosis Date  . Aortic stenosis     s/p AVR 07/2005(porcine)  . PVC's (premature ventricular contractions)   . Hypertension   . Hyperlipidemia   . Phlebitis     UPPER EXTREMITY  . A-fib   . GERD (gastroesophageal reflux disease)   . Gilbert's syndrome   . Shingles   . IFG (impaired fasting glucose)   . OAB (overactive bladder)   . Osteopenia   . Varicose veins     s/p treatment   . Carotid bruit 09/2008    <50% B  . Vertigo     Past Surgical History  Procedure Date  . Cardiac catheterization 07/25/2005    EF 60% THE AORTIC VALVE IS HEAVILY CALCIFIED WITH REDUCED OPENING. NO MVP  . Aortic valve replacement 08/01/2005    WITH A #23MM TORONTO  STENTLESS PORCINE AORTIC VALVE  . US echocardiography 04/14/2008    EF 55-60%. NORMAL  . US echocardiography 11/21/2005    EF 55-60%  . US echocardiography 07/20/2005    EF 55-60%  . US echocardiography 10/19/2004    EF 55-60%  . Abdominal hysterectomy 1963    partial  . Hemorrhoid surgery 1980's  . Foot surgery   . Hand surgery   . Varicose vein surgery 2010    laser treatment    History  Smoking status  . Never Smoker   Smokeless tobacco  . Never Used    History  Alcohol Use No    Family History  Problem Relation Age of Onset  . Pneumonia Mother 98  . Alzheimer's disease Father   . Leukemia Brother   . Alcohol abuse Brother     Review of Systems: As noted in history of present illness.  All other systems were reviewed and are negative.  Physical Exam: BP 142/76  Pulse 51  Ht 5\' 2"  (1.575 m)  Wt 73.211 kg (161 lb 6.4 oz)  BMI 29.52 kg/m2 The patient is alert and oriented x 3.   The HEENT exam is normal.  The carotids are  2+ with radiated murmur bilaterally.  There is no thyromegaly.  There is no JVD.  The lungs are clear.  The chest wall is non tender.  The heart exam reveals a regular rate with a normal S1 and S2.  There are no  gallops or rubs.  There is a grade 1/6 systolic ejection murmur in the right upper sternal border. The PMI is not displaced.   Abdominal exam reveals good bowel sounds.    There are no masses.  Exam of the legs reveal no clubbing, cyanosis, or edema.  The legs are without rashes.  The distal pulses are intact.  Cranial nerves II - XII are intact.  Motor and sensory functions are intact.  The gait is normal.  LABORATORY DATA: ECG today demonstrates normal sinus rhythm with a normal ECG. Rate is 51 beats per minute.  Assessment / Plan: 1. Status post aortic valve replacement with a tissue valve for severe aortic stenosis. Clinically doing very well. I will followup again in one year.  2. Hypertension well controlled on current medications.

## 2011-12-11 DIAGNOSIS — I10 Essential (primary) hypertension: Secondary | ICD-10-CM | POA: Diagnosis not present

## 2011-12-11 DIAGNOSIS — R197 Diarrhea, unspecified: Secondary | ICD-10-CM | POA: Diagnosis not present

## 2011-12-11 DIAGNOSIS — R109 Unspecified abdominal pain: Secondary | ICD-10-CM | POA: Diagnosis not present

## 2012-01-29 ENCOUNTER — Other Ambulatory Visit: Payer: Self-pay | Admitting: Cardiology

## 2012-02-12 DIAGNOSIS — J069 Acute upper respiratory infection, unspecified: Secondary | ICD-10-CM | POA: Diagnosis not present

## 2012-02-12 DIAGNOSIS — R05 Cough: Secondary | ICD-10-CM | POA: Diagnosis not present

## 2012-03-07 DIAGNOSIS — M7750 Other enthesopathy of unspecified foot: Secondary | ICD-10-CM | POA: Diagnosis not present

## 2012-03-08 DIAGNOSIS — M899 Disorder of bone, unspecified: Secondary | ICD-10-CM | POA: Diagnosis not present

## 2012-03-08 DIAGNOSIS — I10 Essential (primary) hypertension: Secondary | ICD-10-CM | POA: Diagnosis not present

## 2012-03-08 DIAGNOSIS — R7301 Impaired fasting glucose: Secondary | ICD-10-CM | POA: Diagnosis not present

## 2012-03-08 DIAGNOSIS — E785 Hyperlipidemia, unspecified: Secondary | ICD-10-CM | POA: Diagnosis not present

## 2012-03-14 DIAGNOSIS — M7750 Other enthesopathy of unspecified foot: Secondary | ICD-10-CM | POA: Diagnosis not present

## 2012-03-21 DIAGNOSIS — Z1331 Encounter for screening for depression: Secondary | ICD-10-CM | POA: Diagnosis not present

## 2012-03-21 DIAGNOSIS — R7301 Impaired fasting glucose: Secondary | ICD-10-CM | POA: Diagnosis not present

## 2012-03-21 DIAGNOSIS — E785 Hyperlipidemia, unspecified: Secondary | ICD-10-CM | POA: Diagnosis not present

## 2012-03-21 DIAGNOSIS — R0989 Other specified symptoms and signs involving the circulatory and respiratory systems: Secondary | ICD-10-CM | POA: Diagnosis not present

## 2012-03-21 DIAGNOSIS — I10 Essential (primary) hypertension: Secondary | ICD-10-CM | POA: Diagnosis not present

## 2012-03-21 DIAGNOSIS — Z0000 Encounter for general adult medical examination without abnormal findings: Secondary | ICD-10-CM | POA: Diagnosis not present

## 2012-03-21 DIAGNOSIS — M899 Disorder of bone, unspecified: Secondary | ICD-10-CM | POA: Diagnosis not present

## 2012-03-21 DIAGNOSIS — M949 Disorder of cartilage, unspecified: Secondary | ICD-10-CM | POA: Diagnosis not present

## 2012-04-19 DIAGNOSIS — I10 Essential (primary) hypertension: Secondary | ICD-10-CM | POA: Diagnosis not present

## 2012-04-19 DIAGNOSIS — Z6829 Body mass index (BMI) 29.0-29.9, adult: Secondary | ICD-10-CM | POA: Diagnosis not present

## 2012-05-07 DIAGNOSIS — H40019 Open angle with borderline findings, low risk, unspecified eye: Secondary | ICD-10-CM | POA: Diagnosis not present

## 2012-05-21 DIAGNOSIS — I10 Essential (primary) hypertension: Secondary | ICD-10-CM | POA: Diagnosis not present

## 2012-05-27 DIAGNOSIS — M7750 Other enthesopathy of unspecified foot: Secondary | ICD-10-CM | POA: Diagnosis not present

## 2012-06-21 ENCOUNTER — Ambulatory Visit: Payer: Medicare Other | Admitting: Cardiology

## 2012-06-28 ENCOUNTER — Ambulatory Visit (INDEPENDENT_AMBULATORY_CARE_PROVIDER_SITE_OTHER): Payer: Medicare Other | Admitting: Nurse Practitioner

## 2012-06-28 ENCOUNTER — Encounter: Payer: Self-pay | Admitting: Nurse Practitioner

## 2012-06-28 VITALS — BP 130/68 | HR 58 | Ht 63.0 in | Wt 162.0 lb

## 2012-06-28 DIAGNOSIS — Z954 Presence of other heart-valve replacement: Secondary | ICD-10-CM

## 2012-06-28 DIAGNOSIS — I10 Essential (primary) hypertension: Secondary | ICD-10-CM | POA: Diagnosis not present

## 2012-06-28 DIAGNOSIS — Z952 Presence of prosthetic heart valve: Secondary | ICD-10-CM

## 2012-06-28 NOTE — Patient Instructions (Addendum)
Try to be more active!  Stay on your current medicines  See Dr. [place] in November  Call the [facility] office at [phone] if you have any questions, problems or concerns.

## 2012-06-28 NOTE — Progress Notes (Signed)
Megan Pitts Date of Birth: 14-Jun-1927 Medical Record #[id number]  History of Present Illness: [name] is seen back today for a 6 month check. Has a history of severe AS and had AVR with a #64mm Toronto stentless porcine valve in July of 2007. Other issues include HTN, PVCs, HLD, atrial fib, GERD, Gilbert's syndrome, and carotid disease.   Last seen in November of 2013 and was doing well.  Comes back today. She is here alone. Doing very well. Has her labs with Dr. Clelia Croft. Now on a triple combo BP med. Her readings from home look good. No chest pain. Not short of breath. Not dizzy or lightheaded. Working 2 to 3 days per week at [employer]. Not exercising and continues to struggle with her weight. Tolerating her medicines and overall doing ok.   Current Outpatient Prescriptions  Medication Sig Dispense Refill  . amLODipine (NORVASC) 5 MG tablet Take 5 mg by mouth daily. 1/2 daily      . Amlodipine-Valsartan-HCTZ 10-320-25 MG TABS Take by mouth daily.      Marland Kitchen aspirin 81 MG tablet Take 81 mg by mouth daily.        Marland Kitchen atenolol (TENORMIN) 25 MG tablet TAKE 1 TABLET ONCE DAILY.  30 tablet  5  . omeprazole (PRILOSEC) 20 MG capsule Take 20 mg by mouth as needed.      . rosuvastatin (CRESTOR) 40 MG tablet Take 40 mg by mouth daily.         No current facility-administered medications for this visit.    No Known Allergies  Past Medical History  Diagnosis Date  . Aortic stenosis     s/p AVR 07/2005(porcine)  . PVC's (premature ventricular contractions)   . Hypertension   . Hyperlipidemia   . Phlebitis     UPPER EXTREMITY  . A-fib   . GERD (gastroesophageal reflux disease)   . Gilbert's syndrome   . Shingles   . IFG (impaired fasting glucose)   . OAB (overactive bladder)   . Osteopenia   . Varicose veins     s/p treatment   . Carotid bruit 09/2008    <50% B  . Vertigo     Past Surgical History  Procedure Laterality Date  . Cardiac catheterization  07/25/2005    EF 60% THE AORTIC  VALVE IS HEAVILY CALCIFIED WITH REDUCED OPENING. NO MVP  . Aortic valve replacement  08/01/2005    WITH A #23MM TORONTO STENTLESS PORCINE AORTIC VALVE  . US echocardiography  04/14/2008    EF 55-60%. NORMAL  . US echocardiography  11/21/2005    EF 55-60%  . US echocardiography  07/20/2005    EF 55-60%  . US echocardiography  10/19/2004    EF 55-60%  . Abdominal hysterectomy  1963    partial  . Hemorrhoid surgery  1980's  . Foot surgery    . Hand surgery    . Varicose vein surgery  2010    laser treatment    History  Smoking status  . Never Smoker   Smokeless tobacco  . Never Used    History  Alcohol Use No    Family History  Problem Relation Age of Onset  . Pneumonia Mother 64  . Alzheimer's disease Father   . Leukemia Brother   . Alcohol abuse Brother     Review of Systems: The review of systems is per the HPI.  All other systems were reviewed and are negative.  Physical Exam: BP 130/68  Pulse  58  Ht 5\' 3"  (1.6 m)  Wt 162 lb (73.483 kg)  BMI 28.7 kg/m2 Patient is very pleasant and in no acute distress. Looks much younger than her stated age. Skin is warm and dry. Color is normal.  HEENT is unremarkable. Normocephalic/atraumatic. PERRL. Sclera are nonicteric. Neck is supple. No masses. No JVD. Lungs are clear. Cardiac exam shows a regular rate and rhythm. Soft outflow murmur noted. Abdomen is soft. Extremities are without edema. Gait and ROM are intact. No gross neurologic deficits noted.  LABORATORY DATA: No results found for this basename: WBC,  HGB,  HCT,  PLT,  GLUCOSE,  CHOL,  TRIG,  HDL,  LDLDIRECT,  LDLCALC,  ALT,  AST,  NA,  K,  CL,  CREATININE,  BUN,  CO2,  TSH,  PSA,  INR,  GLUF,  HGBA1C,  MICROALBUR     Assessment / Plan: 1. S/P AVR - doing very well.  2. HTN - blood pressure looks good. No change in her current therapy.   Overall she is doing well. See her back in 6 months.   Patient is agreeable to this plan and will call if any problems develop  in the interim.   Megan Macadamia, RN, ANP-C [facility] [address] [place], [place]  [address]

## 2012-07-25 DIAGNOSIS — M7750 Other enthesopathy of unspecified foot: Secondary | ICD-10-CM | POA: Diagnosis not present

## 2012-08-07 DIAGNOSIS — M7750 Other enthesopathy of unspecified foot: Secondary | ICD-10-CM | POA: Diagnosis not present

## 2012-08-20 ENCOUNTER — Other Ambulatory Visit: Payer: Self-pay | Admitting: Cardiology

## 2012-08-23 DIAGNOSIS — M109 Gout, unspecified: Secondary | ICD-10-CM | POA: Diagnosis not present

## 2012-08-23 DIAGNOSIS — M159 Polyosteoarthritis, unspecified: Secondary | ICD-10-CM | POA: Diagnosis not present

## 2012-08-28 DIAGNOSIS — M159 Polyosteoarthritis, unspecified: Secondary | ICD-10-CM | POA: Diagnosis not present

## 2012-08-29 DIAGNOSIS — M159 Polyosteoarthritis, unspecified: Secondary | ICD-10-CM | POA: Diagnosis not present

## 2012-08-30 DIAGNOSIS — L821 Other seborrheic keratosis: Secondary | ICD-10-CM | POA: Diagnosis not present

## 2012-08-30 DIAGNOSIS — D239 Other benign neoplasm of skin, unspecified: Secondary | ICD-10-CM | POA: Diagnosis not present

## 2012-08-30 DIAGNOSIS — Z85828 Personal history of other malignant neoplasm of skin: Secondary | ICD-10-CM | POA: Diagnosis not present

## 2012-08-30 DIAGNOSIS — L723 Sebaceous cyst: Secondary | ICD-10-CM | POA: Diagnosis not present

## 2012-08-30 DIAGNOSIS — L819 Disorder of pigmentation, unspecified: Secondary | ICD-10-CM | POA: Diagnosis not present

## 2012-09-04 DIAGNOSIS — G589 Mononeuropathy, unspecified: Secondary | ICD-10-CM | POA: Diagnosis not present

## 2012-09-04 DIAGNOSIS — M7750 Other enthesopathy of unspecified foot: Secondary | ICD-10-CM | POA: Diagnosis not present

## 2012-09-11 DIAGNOSIS — M159 Polyosteoarthritis, unspecified: Secondary | ICD-10-CM | POA: Diagnosis not present

## 2012-09-30 ENCOUNTER — Other Ambulatory Visit: Payer: Self-pay | Admitting: Podiatry

## 2012-09-30 DIAGNOSIS — M7750 Other enthesopathy of unspecified foot: Secondary | ICD-10-CM

## 2012-10-03 ENCOUNTER — Ambulatory Visit
Admission: RE | Admit: 2012-10-03 | Discharge: 2012-10-03 | Disposition: A | Discharge status: Home / Self Care | Payer: Medicare Other | Source: Ambulatory Visit | Attending: Podiatry | Admitting: Podiatry

## 2012-10-03 DIAGNOSIS — M7750 Other enthesopathy of unspecified foot: Secondary | ICD-10-CM

## 2012-10-03 DIAGNOSIS — M19079 Primary osteoarthritis, unspecified ankle and foot: Secondary | ICD-10-CM | POA: Diagnosis not present

## 2012-10-07 DIAGNOSIS — M7750 Other enthesopathy of unspecified foot: Secondary | ICD-10-CM | POA: Diagnosis not present

## 2012-10-10 DIAGNOSIS — M25579 Pain in unspecified ankle and joints of unspecified foot: Secondary | ICD-10-CM | POA: Diagnosis not present

## 2012-10-10 DIAGNOSIS — M25673 Stiffness of unspecified ankle, not elsewhere classified: Secondary | ICD-10-CM | POA: Diagnosis not present

## 2012-10-10 DIAGNOSIS — M6281 Muscle weakness (generalized): Secondary | ICD-10-CM | POA: Diagnosis not present

## 2012-10-11 DIAGNOSIS — R05 Cough: Secondary | ICD-10-CM | POA: Diagnosis not present

## 2012-10-11 DIAGNOSIS — I10 Essential (primary) hypertension: Secondary | ICD-10-CM | POA: Diagnosis not present

## 2012-10-11 DIAGNOSIS — R7301 Impaired fasting glucose: Secondary | ICD-10-CM | POA: Diagnosis not present

## 2012-10-11 DIAGNOSIS — M79609 Pain in unspecified limb: Secondary | ICD-10-CM | POA: Diagnosis not present

## 2012-10-11 DIAGNOSIS — J209 Acute bronchitis, unspecified: Secondary | ICD-10-CM | POA: Diagnosis not present

## 2012-10-11 DIAGNOSIS — Z23 Encounter for immunization: Secondary | ICD-10-CM | POA: Diagnosis not present

## 2012-10-15 DIAGNOSIS — M25579 Pain in unspecified ankle and joints of unspecified foot: Secondary | ICD-10-CM | POA: Diagnosis not present

## 2012-10-15 DIAGNOSIS — M25673 Stiffness of unspecified ankle, not elsewhere classified: Secondary | ICD-10-CM | POA: Diagnosis not present

## 2012-10-15 DIAGNOSIS — M6281 Muscle weakness (generalized): Secondary | ICD-10-CM | POA: Diagnosis not present

## 2012-10-17 DIAGNOSIS — M25673 Stiffness of unspecified ankle, not elsewhere classified: Secondary | ICD-10-CM | POA: Diagnosis not present

## 2012-10-17 DIAGNOSIS — M25579 Pain in unspecified ankle and joints of unspecified foot: Secondary | ICD-10-CM | POA: Diagnosis not present

## 2012-10-17 DIAGNOSIS — M6281 Muscle weakness (generalized): Secondary | ICD-10-CM | POA: Diagnosis not present

## 2012-10-18 DIAGNOSIS — M25579 Pain in unspecified ankle and joints of unspecified foot: Secondary | ICD-10-CM | POA: Diagnosis not present

## 2012-10-18 DIAGNOSIS — M25673 Stiffness of unspecified ankle, not elsewhere classified: Secondary | ICD-10-CM | POA: Diagnosis not present

## 2012-10-18 DIAGNOSIS — M6281 Muscle weakness (generalized): Secondary | ICD-10-CM | POA: Diagnosis not present

## 2012-10-22 DIAGNOSIS — M25579 Pain in unspecified ankle and joints of unspecified foot: Secondary | ICD-10-CM | POA: Diagnosis not present

## 2012-10-22 DIAGNOSIS — M6281 Muscle weakness (generalized): Secondary | ICD-10-CM | POA: Diagnosis not present

## 2012-10-22 DIAGNOSIS — M25673 Stiffness of unspecified ankle, not elsewhere classified: Secondary | ICD-10-CM | POA: Diagnosis not present

## 2012-10-24 DIAGNOSIS — M25673 Stiffness of unspecified ankle, not elsewhere classified: Secondary | ICD-10-CM | POA: Diagnosis not present

## 2012-10-24 DIAGNOSIS — M25579 Pain in unspecified ankle and joints of unspecified foot: Secondary | ICD-10-CM | POA: Diagnosis not present

## 2012-10-24 DIAGNOSIS — M6281 Muscle weakness (generalized): Secondary | ICD-10-CM | POA: Diagnosis not present

## 2012-10-25 DIAGNOSIS — M25579 Pain in unspecified ankle and joints of unspecified foot: Secondary | ICD-10-CM | POA: Diagnosis not present

## 2012-10-25 DIAGNOSIS — M6281 Muscle weakness (generalized): Secondary | ICD-10-CM | POA: Diagnosis not present

## 2012-10-25 DIAGNOSIS — M25673 Stiffness of unspecified ankle, not elsewhere classified: Secondary | ICD-10-CM | POA: Diagnosis not present

## 2012-10-28 DIAGNOSIS — M6281 Muscle weakness (generalized): Secondary | ICD-10-CM | POA: Diagnosis not present

## 2012-10-28 DIAGNOSIS — M25673 Stiffness of unspecified ankle, not elsewhere classified: Secondary | ICD-10-CM | POA: Diagnosis not present

## 2012-10-28 DIAGNOSIS — M25579 Pain in unspecified ankle and joints of unspecified foot: Secondary | ICD-10-CM | POA: Diagnosis not present

## 2012-10-31 DIAGNOSIS — M6281 Muscle weakness (generalized): Secondary | ICD-10-CM | POA: Diagnosis not present

## 2012-10-31 DIAGNOSIS — M25673 Stiffness of unspecified ankle, not elsewhere classified: Secondary | ICD-10-CM | POA: Diagnosis not present

## 2012-10-31 DIAGNOSIS — M25579 Pain in unspecified ankle and joints of unspecified foot: Secondary | ICD-10-CM | POA: Diagnosis not present

## 2012-11-01 DIAGNOSIS — M25579 Pain in unspecified ankle and joints of unspecified foot: Secondary | ICD-10-CM | POA: Diagnosis not present

## 2012-11-01 DIAGNOSIS — M6281 Muscle weakness (generalized): Secondary | ICD-10-CM | POA: Diagnosis not present

## 2012-11-01 DIAGNOSIS — M25673 Stiffness of unspecified ankle, not elsewhere classified: Secondary | ICD-10-CM | POA: Diagnosis not present

## 2012-11-01 DIAGNOSIS — H40019 Open angle with borderline findings, low risk, unspecified eye: Secondary | ICD-10-CM | POA: Diagnosis not present

## 2012-11-05 DIAGNOSIS — M25673 Stiffness of unspecified ankle, not elsewhere classified: Secondary | ICD-10-CM | POA: Diagnosis not present

## 2012-11-05 DIAGNOSIS — M6281 Muscle weakness (generalized): Secondary | ICD-10-CM | POA: Diagnosis not present

## 2012-11-05 DIAGNOSIS — M25579 Pain in unspecified ankle and joints of unspecified foot: Secondary | ICD-10-CM | POA: Diagnosis not present

## 2012-11-07 DIAGNOSIS — M25673 Stiffness of unspecified ankle, not elsewhere classified: Secondary | ICD-10-CM | POA: Diagnosis not present

## 2012-11-07 DIAGNOSIS — M6281 Muscle weakness (generalized): Secondary | ICD-10-CM | POA: Diagnosis not present

## 2012-11-07 DIAGNOSIS — M25579 Pain in unspecified ankle and joints of unspecified foot: Secondary | ICD-10-CM | POA: Diagnosis not present

## 2012-11-12 DIAGNOSIS — M6281 Muscle weakness (generalized): Secondary | ICD-10-CM | POA: Diagnosis not present

## 2012-11-12 DIAGNOSIS — M25579 Pain in unspecified ankle and joints of unspecified foot: Secondary | ICD-10-CM | POA: Diagnosis not present

## 2012-11-12 DIAGNOSIS — M25673 Stiffness of unspecified ankle, not elsewhere classified: Secondary | ICD-10-CM | POA: Diagnosis not present

## 2012-11-14 DIAGNOSIS — M25673 Stiffness of unspecified ankle, not elsewhere classified: Secondary | ICD-10-CM | POA: Diagnosis not present

## 2012-11-14 DIAGNOSIS — M6281 Muscle weakness (generalized): Secondary | ICD-10-CM | POA: Diagnosis not present

## 2012-11-14 DIAGNOSIS — M25579 Pain in unspecified ankle and joints of unspecified foot: Secondary | ICD-10-CM | POA: Diagnosis not present

## 2012-11-19 DIAGNOSIS — M25673 Stiffness of unspecified ankle, not elsewhere classified: Secondary | ICD-10-CM | POA: Diagnosis not present

## 2012-11-19 DIAGNOSIS — M6281 Muscle weakness (generalized): Secondary | ICD-10-CM | POA: Diagnosis not present

## 2012-11-19 DIAGNOSIS — M25579 Pain in unspecified ankle and joints of unspecified foot: Secondary | ICD-10-CM | POA: Diagnosis not present

## 2012-11-22 DIAGNOSIS — M25579 Pain in unspecified ankle and joints of unspecified foot: Secondary | ICD-10-CM | POA: Diagnosis not present

## 2012-11-22 DIAGNOSIS — M25673 Stiffness of unspecified ankle, not elsewhere classified: Secondary | ICD-10-CM | POA: Diagnosis not present

## 2012-11-22 DIAGNOSIS — M6281 Muscle weakness (generalized): Secondary | ICD-10-CM | POA: Diagnosis not present

## 2012-11-26 DIAGNOSIS — M25673 Stiffness of unspecified ankle, not elsewhere classified: Secondary | ICD-10-CM | POA: Diagnosis not present

## 2012-11-26 DIAGNOSIS — M6281 Muscle weakness (generalized): Secondary | ICD-10-CM | POA: Diagnosis not present

## 2012-11-26 DIAGNOSIS — M25549 Pain in joints of unspecified hand: Secondary | ICD-10-CM | POA: Diagnosis not present

## 2012-11-28 ENCOUNTER — Ambulatory Visit (INDEPENDENT_AMBULATORY_CARE_PROVIDER_SITE_OTHER): Payer: Medicare Other | Admitting: Cardiology

## 2012-11-28 ENCOUNTER — Encounter: Payer: Self-pay | Admitting: Cardiology

## 2012-11-28 ENCOUNTER — Encounter (INDEPENDENT_AMBULATORY_CARE_PROVIDER_SITE_OTHER): Payer: Self-pay

## 2012-11-28 VITALS — BP 150/74 | HR 58 | Ht 63.0 in | Wt 166.0 lb

## 2012-11-28 DIAGNOSIS — E785 Hyperlipidemia, unspecified: Secondary | ICD-10-CM | POA: Diagnosis not present

## 2012-11-28 DIAGNOSIS — Z952 Presence of prosthetic heart valve: Secondary | ICD-10-CM

## 2012-11-28 DIAGNOSIS — I10 Essential (primary) hypertension: Secondary | ICD-10-CM

## 2012-11-28 DIAGNOSIS — Z954 Presence of other heart-valve replacement: Secondary | ICD-10-CM

## 2012-11-28 NOTE — Progress Notes (Signed)
Megan Pitts Date of Birth: 1927/06/07 Medical Record #[id number]  History of Present Illness: Megan Pitts is seen back today for a follow up visit. She has a history of severe AS and had AVR with a #52mm [place] stentless porcine valve in July of 2007. Other issues include HTN, PVCs, HLD, post op atrial fib, GERD, Gilbert's syndrome, and carotid disease.  On phone today she is doing very well. She denies any chest pain or shortness of breath. She has no palpitations. Her only complaint has been of tendinitis in her foot. She does note that her blood pressure goes up and down. She is concerned about her weight gain.   Current Outpatient Prescriptions  Medication Sig Dispense Refill  . amLODipine (NORVASC) 5 MG tablet Take 5 mg by mouth daily. 1/2 daily      . Amlodipine-Valsartan-HCTZ 10-320-25 MG TABS Take by mouth daily.      Marland Kitchen aspirin 81 MG tablet Take 81 mg by mouth daily.        Marland Kitchen atenolol (TENORMIN) 25 MG tablet TAKE 1 TABLET ONCE DAILY.  30 tablet  4  . NON FORMULARY Vitamin d injection once a week      . omeprazole (PRILOSEC) 20 MG capsule Take 20 mg by mouth as needed.      . rosuvastatin (CRESTOR) 40 MG tablet Take 40 mg by mouth daily.         No current facility-administered medications for this visit.    No Known Allergies  Past Medical History  Diagnosis Date  . Aortic stenosis     s/p AVR 07/2005(porcine)  . PVC's (premature ventricular contractions)   . Hypertension   . Hyperlipidemia   . Phlebitis     UPPER EXTREMITY  . A-fib   . GERD (gastroesophageal reflux disease)   . Gilbert's syndrome   . Shingles   . IFG (impaired fasting glucose)   . OAB (overactive bladder)   . Osteopenia   . Varicose veins     s/p treatment   . Carotid bruit 09/2008    <50% B  . Vertigo     Past Surgical History  Procedure Laterality Date  . Cardiac catheterization  07/25/2005    EF 60% THE AORTIC VALVE IS HEAVILY CALCIFIED WITH REDUCED OPENING. NO MVP  . Aortic valve  replacement  08/01/2005    WITH A #23MM TORONTO STENTLESS PORCINE AORTIC VALVE  . US echocardiography  04/14/2008    EF 55-60%. NORMAL  . US echocardiography  11/21/2005    EF 55-60%  . US echocardiography  07/20/2005    EF 55-60%  . US echocardiography  10/19/2004    EF 55-60%  . Abdominal hysterectomy  1963    partial  . Hemorrhoid surgery  1980's  . Foot surgery    . Hand surgery    . Varicose vein surgery  2010    laser treatment    History  Smoking status  . Never Smoker   Smokeless tobacco  . Never Used    History  Alcohol Use No    Family History  Problem Relation Age of Onset  . Pneumonia Mother 68  . Alzheimer's disease Father   . Leukemia Brother   . Alcohol abuse Brother     Review of Systems: The review of systems is per the HPI.  All other systems were reviewed and are negative.  Physical Exam: BP 150/74  Pulse 58  Ht 5\' 3"  (1.6 m)  Wt 166 lb (75.297  kg)  BMI 29.41 kg/m2 Patient is very pleasant and in no acute distress. Looks much younger than her stated age. Skin is warm and dry. Color is normal.  HEENT is unremarkable. Normocephalic/atraumatic. PERRL. Sclera are nonicteric. Neck is supple. No masses. No JVD. Lungs are clear. Cardiac exam shows a regular rate and rhythm. Soft outflow murmur noted. Abdomen is soft. Extremities are without edema. Gait and ROM are intact. No gross neurologic deficits noted.  LABORATORY DATA: ECG today demonstrates sinus bradycardia with a rate of 58 beats per minute. Occasional PACs. Left atrial enlargement.   Assessment / Plan: 1. S/P AVR with tissue prosthesis in 2007- doing very well. We will update an echocardiogram at this time.  2. HTN - mildly elevated. No change in her current therapy.   3. Hyperlipidemia-on Crestor. I requested her most recent lab results.  Overall she is doing well. See her back in 6 months.

## 2012-11-28 NOTE — Patient Instructions (Signed)
We will schedule you for an echocardiogram  I will see you in 6 months

## 2012-11-29 DIAGNOSIS — M25579 Pain in unspecified ankle and joints of unspecified foot: Secondary | ICD-10-CM | POA: Diagnosis not present

## 2012-11-29 DIAGNOSIS — M6281 Muscle weakness (generalized): Secondary | ICD-10-CM | POA: Diagnosis not present

## 2012-11-29 DIAGNOSIS — M25673 Stiffness of unspecified ankle, not elsewhere classified: Secondary | ICD-10-CM | POA: Diagnosis not present

## 2012-12-13 ENCOUNTER — Encounter: Payer: Self-pay | Admitting: Cardiology

## 2012-12-13 ENCOUNTER — Ambulatory Visit (HOSPITAL_COMMUNITY): Payer: Medicare Other | Attending: Cardiology | Admitting: Radiology

## 2012-12-13 DIAGNOSIS — I359 Nonrheumatic aortic valve disorder, unspecified: Secondary | ICD-10-CM | POA: Insufficient documentation

## 2012-12-13 DIAGNOSIS — R0989 Other specified symptoms and signs involving the circulatory and respiratory systems: Secondary | ICD-10-CM | POA: Diagnosis not present

## 2012-12-13 DIAGNOSIS — I10 Essential (primary) hypertension: Secondary | ICD-10-CM | POA: Diagnosis not present

## 2012-12-13 DIAGNOSIS — E785 Hyperlipidemia, unspecified: Secondary | ICD-10-CM | POA: Diagnosis not present

## 2012-12-13 DIAGNOSIS — I4891 Unspecified atrial fibrillation: Secondary | ICD-10-CM | POA: Insufficient documentation

## 2012-12-13 DIAGNOSIS — I079 Rheumatic tricuspid valve disease, unspecified: Secondary | ICD-10-CM | POA: Insufficient documentation

## 2012-12-13 DIAGNOSIS — I059 Rheumatic mitral valve disease, unspecified: Secondary | ICD-10-CM | POA: Insufficient documentation

## 2012-12-13 DIAGNOSIS — I4949 Other premature depolarization: Secondary | ICD-10-CM | POA: Insufficient documentation

## 2012-12-13 DIAGNOSIS — Z952 Presence of prosthetic heart valve: Secondary | ICD-10-CM

## 2012-12-13 NOTE — Progress Notes (Signed)
Echocardiogram performed.  

## 2013-01-31 ENCOUNTER — Other Ambulatory Visit: Payer: Self-pay | Admitting: Cardiology

## 2013-02-03 DIAGNOSIS — R509 Fever, unspecified: Secondary | ICD-10-CM | POA: Diagnosis not present

## 2013-02-03 DIAGNOSIS — J209 Acute bronchitis, unspecified: Secondary | ICD-10-CM | POA: Diagnosis not present

## 2013-02-03 DIAGNOSIS — R059 Cough, unspecified: Secondary | ICD-10-CM | POA: Diagnosis not present

## 2013-02-03 DIAGNOSIS — R05 Cough: Secondary | ICD-10-CM | POA: Diagnosis not present

## 2013-02-03 DIAGNOSIS — Z6829 Body mass index (BMI) 29.0-29.9, adult: Secondary | ICD-10-CM | POA: Diagnosis not present

## 2013-02-05 DIAGNOSIS — Z6829 Body mass index (BMI) 29.0-29.9, adult: Secondary | ICD-10-CM | POA: Diagnosis not present

## 2013-02-05 DIAGNOSIS — R05 Cough: Secondary | ICD-10-CM | POA: Diagnosis not present

## 2013-02-05 DIAGNOSIS — J209 Acute bronchitis, unspecified: Secondary | ICD-10-CM | POA: Diagnosis not present

## 2013-02-05 DIAGNOSIS — R059 Cough, unspecified: Secondary | ICD-10-CM | POA: Diagnosis not present

## 2013-03-18 DIAGNOSIS — R7301 Impaired fasting glucose: Secondary | ICD-10-CM | POA: Diagnosis not present

## 2013-03-18 DIAGNOSIS — I10 Essential (primary) hypertension: Secondary | ICD-10-CM | POA: Diagnosis not present

## 2013-03-18 DIAGNOSIS — M949 Disorder of cartilage, unspecified: Secondary | ICD-10-CM | POA: Diagnosis not present

## 2013-03-18 DIAGNOSIS — E785 Hyperlipidemia, unspecified: Secondary | ICD-10-CM | POA: Diagnosis not present

## 2013-03-18 DIAGNOSIS — M899 Disorder of bone, unspecified: Secondary | ICD-10-CM | POA: Diagnosis not present

## 2013-03-25 DIAGNOSIS — G609 Hereditary and idiopathic neuropathy, unspecified: Secondary | ICD-10-CM | POA: Diagnosis not present

## 2013-03-25 DIAGNOSIS — G3184 Mild cognitive impairment, so stated: Secondary | ICD-10-CM | POA: Diagnosis not present

## 2013-03-25 DIAGNOSIS — E785 Hyperlipidemia, unspecified: Secondary | ICD-10-CM | POA: Diagnosis not present

## 2013-03-25 DIAGNOSIS — E039 Hypothyroidism, unspecified: Secondary | ICD-10-CM | POA: Diagnosis not present

## 2013-03-25 DIAGNOSIS — M949 Disorder of cartilage, unspecified: Secondary | ICD-10-CM | POA: Diagnosis not present

## 2013-03-25 DIAGNOSIS — I10 Essential (primary) hypertension: Secondary | ICD-10-CM | POA: Diagnosis not present

## 2013-03-25 DIAGNOSIS — M899 Disorder of bone, unspecified: Secondary | ICD-10-CM | POA: Diagnosis not present

## 2013-03-25 DIAGNOSIS — Z0000 Encounter for general adult medical examination without abnormal findings: Secondary | ICD-10-CM | POA: Diagnosis not present

## 2013-03-25 DIAGNOSIS — R7301 Impaired fasting glucose: Secondary | ICD-10-CM | POA: Diagnosis not present

## 2013-03-27 DIAGNOSIS — Z1212 Encounter for screening for malignant neoplasm of rectum: Secondary | ICD-10-CM | POA: Diagnosis not present

## 2013-04-03 DIAGNOSIS — H40019 Open angle with borderline findings, low risk, unspecified eye: Secondary | ICD-10-CM | POA: Diagnosis not present

## 2013-05-29 ENCOUNTER — Encounter: Payer: Self-pay | Admitting: Cardiology

## 2013-05-29 ENCOUNTER — Ambulatory Visit (INDEPENDENT_AMBULATORY_CARE_PROVIDER_SITE_OTHER): Payer: Medicare Other | Admitting: Cardiology

## 2013-05-29 VITALS — BP 134/60 | HR 68 | Ht 63.0 in | Wt 163.1 lb

## 2013-05-29 DIAGNOSIS — E785 Hyperlipidemia, unspecified: Secondary | ICD-10-CM

## 2013-05-29 DIAGNOSIS — Z954 Presence of other heart-valve replacement: Secondary | ICD-10-CM | POA: Diagnosis not present

## 2013-05-29 DIAGNOSIS — I10 Essential (primary) hypertension: Secondary | ICD-10-CM

## 2013-05-29 DIAGNOSIS — Z952 Presence of prosthetic heart valve: Secondary | ICD-10-CM

## 2013-05-29 NOTE — Patient Instructions (Signed)
Continue your current therapy  I will see you in 6 months  I will request your lab work from Dr. Brigitte Pulse

## 2013-05-29 NOTE — Progress Notes (Signed)
Megan Pitts Date of Birth: September 10, 1927 Medical Record #[id number]  History of Present Illness: Megan Pitts is seen back today for a follow up visit. She has a history of severe AS and had AVR with a #79mm [place] stentless porcine valve in July of 2007. Echo in 12/14 showed normal valve function. Other issues include HTN, PVCs, HLD, post op atrial fib, GERD, Gilbert's syndrome, and carotid disease.  On follow up today she is doing very well. She denies any chest pain or shortness of breath. She has no palpitations. She states that she was started on thyroid medication but this messed her up. It caused HAs or tightness in her head and she also started having knee pain. She stopped the medication and is going to discuss this with Dr. Brigitte Pulse.   Current Outpatient Prescriptions  Medication Sig Dispense Refill  . amLODipine (NORVASC) 5 MG tablet Take 5 mg by mouth daily. 1/2 daily      . Amlodipine-Valsartan-HCTZ 10-320-25 MG TABS Take by mouth daily.      Marland Kitchen aspirin 81 MG tablet Take 81 mg by mouth daily.        Marland Kitchen atenolol (TENORMIN) 25 MG tablet TAKE 1 TABLET ONCE DAILY.  30 tablet  3  . NON FORMULARY Vitamin d injection once a week      . omeprazole (PRILOSEC) 20 MG capsule Take 20 mg by mouth as needed.      . rosuvastatin (CRESTOR) 40 MG tablet Take 20 mg by mouth daily.        No current facility-administered medications for this visit.    Allergies  Allergen Reactions  . Synthroid [Levothyroxine Sodium]     Past Medical History  Diagnosis Date  . Aortic stenosis     s/p AVR 07/2005(porcine)  . PVC's (premature ventricular contractions)   . Hypertension   . Hyperlipidemia   . Phlebitis     UPPER EXTREMITY  . A-fib   . GERD (gastroesophageal reflux disease)   . Gilbert's syndrome   . Shingles   . IFG (impaired fasting glucose)   . OAB (overactive bladder)   . Osteopenia   . Varicose veins     s/p treatment   . Carotid bruit 09/2008    <50% B  . Vertigo     Past Surgical  History  Procedure Laterality Date  . Cardiac catheterization  07/25/2005    EF 60% THE AORTIC VALVE IS HEAVILY CALCIFIED WITH REDUCED OPENING. NO MVP  . Aortic valve replacement  08/01/2005    WITH A #23MM [place] STENTLESS PORCINE AORTIC VALVE  . US echocardiography  04/14/2008    EF 55-60%. NORMAL  . US echocardiography  11/21/2005    EF 55-60%  . US echocardiography  07/20/2005    EF 55-60%  . US echocardiography  10/19/2004    EF 55-60%  . Abdominal hysterectomy  1963    partial  . Hemorrhoid surgery  1980's  . Foot surgery    . Hand surgery    . Varicose vein surgery  2010    laser treatment    History  Smoking status  . Never Smoker   Smokeless tobacco  . Never Used    History  Alcohol Use No    Family History  Problem Relation Age of Onset  . Pneumonia Mother 53  . Alzheimer's disease Father   . Leukemia Brother   . Alcohol abuse Brother     Review of Systems: The review of systems is per  the HPI.  All other systems were reviewed and are negative.  Physical Exam: BP 134/60  Pulse 68  Ht 5\' 3"  (1.6 m)  Wt 163 lb 1.9 oz (73.991 kg)  BMI 28.90 kg/m2  SpO2 99% Patient is very pleasant and in no acute distress. Looks  younger than her stated age. Skin is warm and dry. Color is normal.  HEENT is unremarkable. Normocephalic/atraumatic. PERRL. Sclera are nonicteric. Neck is supple. No masses. No JVD. Lungs are clear. Cardiac exam shows a regular rate and rhythm. 2/6 systolic outflow murmur noted. Abdomen is soft. Extremities are without edema. Gait and ROM are intact. No gross neurologic deficits noted.  LABORATORY DATA: Echo: 12/13/12:Study Conclusions  - Left ventricle: The cavity size was normal. Wall thickness was normal. Systolic function was normal. The estimated ejection fraction was in the range of 60% to 65%. Wall motion was normal; there were no regional wall motion abnormalities. Features are consistent with a pseudonormal left ventricular filling  pattern, with concomitant abnormal relaxation and increased filling pressure (grade 2 diastolic dysfunction). Doppler parameters are consistent with elevated ventricular end-diastolic filling pressure. - Aortic valve: A bioprosthesis was present. Trivial regurgitation. No significant perivalvular regurgitation. - Mitral valve: Calcified annulus. Mildly thickened leaflets . Mild regurgitation. - Left atrium: The atrium was mildly dilated. - Atrial septum: No defect or patent foramen ovale was identified.      Assessment / Plan: 1. S/P AVR with tissue prosthesis in 2007- doing very well.   2. HTN - controlled. Continue current therapy  3. Hyperlipidemia-on Crestor. I requested her most recent lab results.  Overall she is doing well. See her back in 6 months.

## 2013-06-03 ENCOUNTER — Other Ambulatory Visit: Payer: Self-pay | Admitting: Cardiology

## 2013-06-17 DIAGNOSIS — H18419 Arcus senilis, unspecified eye: Secondary | ICD-10-CM | POA: Diagnosis not present

## 2013-06-17 DIAGNOSIS — I10 Essential (primary) hypertension: Secondary | ICD-10-CM | POA: Diagnosis not present

## 2013-06-17 DIAGNOSIS — H02839 Dermatochalasis of unspecified eye, unspecified eyelid: Secondary | ICD-10-CM | POA: Diagnosis not present

## 2013-06-17 DIAGNOSIS — H2510 Age-related nuclear cataract, unspecified eye: Secondary | ICD-10-CM | POA: Diagnosis not present

## 2013-06-21 ENCOUNTER — Encounter (HOSPITAL_COMMUNITY): Payer: Self-pay | Admitting: Emergency Medicine

## 2013-06-21 ENCOUNTER — Emergency Department (HOSPITAL_COMMUNITY)
Admission: EM | Admit: 2013-06-21 | Discharge: 2013-06-21 | Disposition: A | Discharge status: Home / Self Care | Payer: Medicare Other | Attending: Emergency Medicine | Admitting: Emergency Medicine

## 2013-06-21 ENCOUNTER — Emergency Department (HOSPITAL_COMMUNITY): Payer: Medicare Other

## 2013-06-21 DIAGNOSIS — I4891 Unspecified atrial fibrillation: Secondary | ICD-10-CM | POA: Insufficient documentation

## 2013-06-21 DIAGNOSIS — R7301 Impaired fasting glucose: Secondary | ICD-10-CM | POA: Insufficient documentation

## 2013-06-21 DIAGNOSIS — I8390 Asymptomatic varicose veins of unspecified lower extremity: Secondary | ICD-10-CM | POA: Diagnosis not present

## 2013-06-21 DIAGNOSIS — M899 Disorder of bone, unspecified: Secondary | ICD-10-CM | POA: Diagnosis not present

## 2013-06-21 DIAGNOSIS — E785 Hyperlipidemia, unspecified: Secondary | ICD-10-CM | POA: Diagnosis not present

## 2013-06-21 DIAGNOSIS — N318 Other neuromuscular dysfunction of bladder: Secondary | ICD-10-CM | POA: Insufficient documentation

## 2013-06-21 DIAGNOSIS — R42 Dizziness and giddiness: Secondary | ICD-10-CM | POA: Diagnosis not present

## 2013-06-21 DIAGNOSIS — I4949 Other premature depolarization: Secondary | ICD-10-CM | POA: Insufficient documentation

## 2013-06-21 DIAGNOSIS — Z888 Allergy status to other drugs, medicaments and biological substances status: Secondary | ICD-10-CM | POA: Insufficient documentation

## 2013-06-21 DIAGNOSIS — Z7982 Long term (current) use of aspirin: Secondary | ICD-10-CM | POA: Diagnosis not present

## 2013-06-21 DIAGNOSIS — R0989 Other specified symptoms and signs involving the circulatory and respiratory systems: Secondary | ICD-10-CM | POA: Diagnosis not present

## 2013-06-21 DIAGNOSIS — I359 Nonrheumatic aortic valve disorder, unspecified: Secondary | ICD-10-CM | POA: Insufficient documentation

## 2013-06-21 DIAGNOSIS — M7981 Nontraumatic hematoma of soft tissue: Secondary | ICD-10-CM | POA: Diagnosis not present

## 2013-06-21 DIAGNOSIS — S8010XA Contusion of unspecified lower leg, initial encounter: Secondary | ICD-10-CM

## 2013-06-21 DIAGNOSIS — Z8672 Personal history of thrombophlebitis: Secondary | ICD-10-CM | POA: Diagnosis not present

## 2013-06-21 DIAGNOSIS — S8000XA Contusion of unspecified knee, initial encounter: Secondary | ICD-10-CM | POA: Diagnosis not present

## 2013-06-21 DIAGNOSIS — M7989 Other specified soft tissue disorders: Secondary | ICD-10-CM | POA: Diagnosis not present

## 2013-06-21 DIAGNOSIS — B029 Zoster without complications: Secondary | ICD-10-CM | POA: Insufficient documentation

## 2013-06-21 DIAGNOSIS — Y929 Unspecified place or not applicable: Secondary | ICD-10-CM | POA: Insufficient documentation

## 2013-06-21 DIAGNOSIS — M949 Disorder of cartilage, unspecified: Secondary | ICD-10-CM

## 2013-06-21 DIAGNOSIS — X58XXXA Exposure to other specified factors, initial encounter: Secondary | ICD-10-CM | POA: Insufficient documentation

## 2013-06-21 DIAGNOSIS — K219 Gastro-esophageal reflux disease without esophagitis: Secondary | ICD-10-CM | POA: Diagnosis not present

## 2013-06-21 DIAGNOSIS — Y939 Activity, unspecified: Secondary | ICD-10-CM | POA: Insufficient documentation

## 2013-06-21 DIAGNOSIS — I10 Essential (primary) hypertension: Secondary | ICD-10-CM | POA: Diagnosis not present

## 2013-06-21 DIAGNOSIS — M25869 Other specified joint disorders, unspecified knee: Secondary | ICD-10-CM | POA: Diagnosis not present

## 2013-06-21 DIAGNOSIS — Z79899 Other long term (current) drug therapy: Secondary | ICD-10-CM | POA: Insufficient documentation

## 2013-06-21 DIAGNOSIS — M79609 Pain in unspecified limb: Secondary | ICD-10-CM | POA: Diagnosis not present

## 2013-06-21 LAB — I-STAT CHEM 8, ED
BUN: 16 mg/dL (ref 6–23)
Calcium, Ion: 1.17 mmol/L (ref 1.13–1.30)
Chloride: 98 mEq/L (ref 96–112)
Creatinine, Ser: 0.9 mg/dL (ref 0.50–1.10)
Glucose, Bld: 102 mg/dL — ABNORMAL HIGH (ref 70–99)
HCT: 45 % (ref 36.0–46.0)
HEMOGLOBIN: 15.3 g/dL — AB (ref 12.0–15.0)
Potassium: 3.3 mEq/L — ABNORMAL LOW (ref 3.7–5.3)
Sodium: 138 mEq/L (ref 137–147)
TCO2: 25 mmol/L (ref 0–100)

## 2013-06-21 LAB — CBC
HCT: 40.2 % (ref 36.0–46.0)
Hemoglobin: 13.9 g/dL (ref 12.0–15.0)
MCH: 30 pg (ref 26.0–34.0)
MCHC: 34.6 g/dL (ref 30.0–36.0)
MCV: 86.6 fL (ref 78.0–100.0)
PLATELETS: 286 10*3/uL (ref 150–400)
RBC: 4.64 MIL/uL (ref 3.87–5.11)
RDW: 13 % (ref 11.5–15.5)
WBC: 8.7 10*3/uL (ref 4.0–10.5)

## 2013-06-21 NOTE — ED Notes (Signed)
Per pt sts that she was in the shower this am and noticed bruising to RLE. Denies pain or any other issues with that leg. sts that she was sent here from her doctor to R/O blood clot.

## 2013-06-21 NOTE — ED Notes (Signed)
Remains out of department at this time

## 2013-06-21 NOTE — ED Provider Notes (Signed)
CSN: [id number]     Arrival date & time 06/21/13  1108 History   First MD Initiated Contact with Patient 06/21/13 1125     Chief Complaint  Patient presents with  . DVT     (Consider location/radiation/quality/duration/timing/severity/associated sxs/prior Treatment) HPI Comments: Patient is an 80 year old female past medical history significant for aortic stenosis, hypertension, hyperlipidemia, A. Fib, Gilbert's syndrome presenting to the emergency department from coronary care physician for a rule out for blood clot. Patient states that she was in her shower this morning when she noticed bruising to the lateral portion of her right knee. Patient denies any falls or injuries. Endorses only mild pain to the area. No history of blood clots. Patient does not take any Coumadin, Xarelto. Denies any hemoptysis, recent long travel, recent surgeries or immobilizations.    Past Medical History  Diagnosis Date  . Aortic stenosis     s/p AVR 07/2005(porcine)  . PVC's (premature ventricular contractions)   . Hypertension   . Hyperlipidemia   . Phlebitis     UPPER EXTREMITY  . A-fib   . GERD (gastroesophageal reflux disease)   . Gilbert's syndrome   . Shingles   . IFG (impaired fasting glucose)   . OAB (overactive bladder)   . Osteopenia   . Varicose veins     s/p treatment   . Carotid bruit 09/2008    <50% B  . Vertigo    Past Surgical History  Procedure Laterality Date  . Cardiac catheterization  07/25/2005    EF 60% THE AORTIC VALVE IS HEAVILY CALCIFIED WITH REDUCED OPENING. NO MVP  . Aortic valve replacement  08/01/2005    WITH A #23MM [place] STENTLESS PORCINE AORTIC VALVE  . US echocardiography  04/14/2008    EF 55-60%. NORMAL  . US echocardiography  11/21/2005    EF 55-60%  . US echocardiography  07/20/2005    EF 55-60%  . US echocardiography  10/19/2004    EF 55-60%  . Abdominal hysterectomy  1963    partial  . Hemorrhoid surgery  1980's  . Foot surgery    . Hand surgery      . Varicose vein surgery  2010    laser treatment   Family History  Problem Relation Age of Onset  . Pneumonia Mother 63  . Alzheimer's disease Father   . Leukemia Brother   . Alcohol abuse Brother    History  Substance Use Topics  . Smoking status: Never Smoker   . Smokeless tobacco: Never Used  . Alcohol Use: No   OB History   Grav Para Term Preterm Abortions TAB SAB Ect Mult Living                 Review of Systems  Constitutional: Negative for fever and chills.  Respiratory: Negative for shortness of breath.   Cardiovascular: Negative for chest pain and leg swelling.  Musculoskeletal: Positive for myalgias.  Skin: Positive for color change (bruising).  All other systems reviewed and are negative.     Allergies  Synthroid  Home Medications   Prior to Admission medications   Medication Sig Start Date End Date Taking? Authorizing Provider  amLODipine (NORVASC) 5 MG tablet Take 5 mg by mouth daily. 1/2 daily    Historical Provider, MD  Amlodipine-Valsartan-HCTZ [id number] MG TABS Take by mouth daily.    Historical Provider, MD  aspirin 81 MG tablet Take 81 mg by mouth daily.      Historical Provider, MD  atenolol (TENORMIN)  25 MG tablet TAKE 1 TABLET ONCE DAILY.    Peter M Martinique, MD  NON FORMULARY Vitamin d injection once a week    Historical Provider, MD  omeprazole (PRILOSEC) 20 MG capsule Take 20 mg by mouth as needed.    Historical Provider, MD  rosuvastatin (CRESTOR) 40 MG tablet Take 20 mg by mouth daily.     Historical Provider, MD   BP 160/39  Pulse 45  Temp(Src) 98.3 F (36.8 C) (Oral)  Resp 17  Ht 5\' 3"  (1.6 m)  Wt 164 lb (74.39 kg)  BMI 29.06 kg/m2  SpO2 98% Physical Exam  Nursing note and vitals reviewed. Constitutional: She is oriented to person, place, and time. She appears well-developed and well-nourished. No distress.  HENT:  Head: Normocephalic and atraumatic.  Right Ear: External ear normal.  Left Ear: External ear normal.  Nose: Nose  normal.  Mouth/Throat: Oropharynx is clear and moist.  Eyes: Conjunctivae are normal.  Neck: Normal range of motion. Neck supple.  Cardiovascular: Normal rate, regular rhythm, normal heart sounds and intact distal pulses.   Pulmonary/Chest: Effort normal and breath sounds normal. No respiratory distress.  Abdominal: Soft. There is no tenderness.  Musculoskeletal: Normal range of motion.       Right knee: She exhibits ecchymosis. She exhibits normal range of motion, no swelling, no effusion, no deformity, no laceration, no erythema and normal alignment. Tenderness found.       Left knee: Normal.       Right ankle: Normal.       Left ankle: Normal.       Right upper leg: She exhibits tenderness (mild) and swelling (mild). She exhibits no bony tenderness, no edema, no deformity and no laceration.       Left upper leg: Normal.       Right lower leg: Normal.       Left lower leg: Normal.       Legs: Negative Homan's sign.   Neurological: She is alert and oriented to person, place, and time.  Skin: Skin is warm and dry. She is not diaphoretic.  Psychiatric: She has a normal mood and affect.    ED Course  Procedures (including critical care time) Medications - No data to display  Labs Review Labs Reviewed  I-STAT CHEM 8, ED - Abnormal; Notable for the following:    Potassium 3.3 (*)    Glucose, Bld 102 (*)    Hemoglobin 15.3 (*)    All other components within normal limits  CBC    Imaging Review Dg Knee Complete 4 Views Right  06/21/2013   CLINICAL DATA:  DVT.  EXAM: RIGHT KNEE - COMPLETE 4+ VIEW  COMPARISON:  None.  FINDINGS: There is no evidence of fracture, dislocation, or joint effusion. There is no evidence of arthropathy or other focal bone abnormality.  IMPRESSION: Negative.   Electronically Signed   By: Jorje Guild M.D.   On: 06/21/2013 13:31     EKG Interpretation None      Filed: 06/21/2013 12:43 PM Note Time: 06/21/2013 12:42 PM Status: Signed   Editor: Iantha Fallen, RVS (Cardiovascular Sonographer)      VASCULAR LAB  PRELIMINARY PRELIMINARY PRELIMINARY PRELIMINARY  Right lower extremity venous Dopplers completed.  Preliminary report: There is no DVT or SVT noted in the bilateral lower extremities.  KANADY, CANDACE, RVT  06/21/2013, 12:43 PM       MDM   Final diagnoses:  Superficial bruising of lower leg  Filed Vitals:   06/21/13 1345  BP: 160/39  Pulse: 45  Temp:   Resp:    Patient does not feel she needs any pain medications at this time.  Afebrile, NAD, non-toxic appearing, AAOx4. Neurovascularly intact. Normal sensation. ROM intact w/o pain. Mild swelling to the right upper thigh. Mild bruise noted to right lateral portion of knee. No erythema or warmth to suggest infection. Negative Homans sign. X-rays unremarkable. Right lower extremity venous Doppler completed without abnormality. No evidence of DVT or SVT in either lower extremity. Advised PCP followup. Return precautions discussed. Patient is agreeable to plan. Patient stable for discharge. Patient d/w with Dr. Reather Converse, agrees with plan.       Harlow Mares, PA-C 06/21/13 1426

## 2013-06-21 NOTE — Discharge Instructions (Signed)
Please follow up with your primary care physician in 1-2 days. If you do not have one please call the [facility] number listed above. Please read all discharge instructions and return precautions.   Contusion A contusion is a deep bruise. Contusions are the result of an injury that caused bleeding under the skin. The contusion may turn blue, purple, or yellow. Minor injuries will give you a painless contusion, but more severe contusions may stay painful and swollen for a few weeks.  CAUSES  A contusion is usually caused by a blow, trauma, or direct force to an area of the body. SYMPTOMS   Swelling and redness of the injured area.  Bruising of the injured area.  Tenderness and soreness of the injured area.  Pain. DIAGNOSIS  The diagnosis can be made by taking a history and physical exam. An X-ray, CT scan, or MRI may be needed to determine if there were any associated injuries, such as fractures. TREATMENT  Specific treatment will depend on what area of the body was injured. In general, the best treatment for a contusion is resting, icing, elevating, and applying cold compresses to the injured area. Over-the-counter medicines may also be recommended for pain control. Ask your caregiver what the best treatment is for your contusion. HOME CARE INSTRUCTIONS   Put ice on the injured area.  Put ice in a plastic bag.  Place a towel between your skin and the bag.  Leave the ice on for 15-20 minutes, 03-04 times a day.  Only take over-the-counter or prescription medicines for pain, discomfort, or fever as directed by your caregiver. Your caregiver may recommend avoiding anti-inflammatory medicines (aspirin, ibuprofen, and naproxen) for 48 hours because these medicines may increase bruising.  Rest the injured area.  If possible, elevate the injured area to reduce swelling. SEEK IMMEDIATE MEDICAL CARE IF:   You have increased bruising or swelling.  You have pain that  is getting worse.  Your swelling or pain is not relieved with medicines. MAKE SURE YOU:   Understand these instructions.  Will watch your condition.  Will get help right away if you are not doing well or get worse. Document Released: 10/05/2004 Document Revised: 03/20/2011 Document Reviewed: 10/31/2010 [facility] Patient Information 2014 [place], [place].

## 2013-06-21 NOTE — ED Provider Notes (Signed)
Medical screening examination/treatment/procedure(s) were conducted as a shared visit with non-physician practitioner(s) or resident and myself. I personally evaluated the patient during the encounter and agree with the findings and plan unless otherwise indicated.  I have personally reviewed any xrays and/ or EKG's with the provider and I agree with interpretation.  Patient sent here from her Dr. Grandville Silos blood clot. Patient has noticed recent swelling and mild tenderness posterior right leg knee with mild bruising and no injury recall. Patient is on aspirin.Patient denies blood clot history, active cancer, recent major trauma or surgery, unilateral leg swelling/ pain, recent long travel, hemoptysis or oral contraceptives. On exam patient well-appearing, mild bradycardia, no respiratory difficulty, mild tenderness posterior right knee with mild ecchymosis lateral aspect, full range of motion knee neurovascularly intact right lower extremity. Plan for x-ray and ultrasound rule out blood clot.  Both unremarkable. Right leg pain/ swelling   Megan Clonts, MD 06/21/13 2044

## 2013-06-21 NOTE — ED Notes (Signed)
Patient transported to X-ray 

## 2013-06-21 NOTE — Progress Notes (Signed)
VASCULAR LAB PRELIMINARY  PRELIMINARY  PRELIMINARY  PRELIMINARY  Right lower extremity venous Dopplers completed.    Preliminary report:  There is no DVT or SVT noted in the bilateral lower extremities.    Jujhar Everett, RVT 06/21/2013, 12:43 PM

## 2013-06-24 DIAGNOSIS — G609 Hereditary and idiopathic neuropathy, unspecified: Secondary | ICD-10-CM | POA: Diagnosis not present

## 2013-06-24 DIAGNOSIS — I10 Essential (primary) hypertension: Secondary | ICD-10-CM | POA: Diagnosis not present

## 2013-06-24 DIAGNOSIS — R7301 Impaired fasting glucose: Secondary | ICD-10-CM | POA: Diagnosis not present

## 2013-06-24 DIAGNOSIS — Z23 Encounter for immunization: Secondary | ICD-10-CM | POA: Diagnosis not present

## 2013-06-24 DIAGNOSIS — M949 Disorder of cartilage, unspecified: Secondary | ICD-10-CM | POA: Diagnosis not present

## 2013-06-24 DIAGNOSIS — R233 Spontaneous ecchymoses: Secondary | ICD-10-CM | POA: Diagnosis not present

## 2013-06-24 DIAGNOSIS — E039 Hypothyroidism, unspecified: Secondary | ICD-10-CM | POA: Diagnosis not present

## 2013-06-24 DIAGNOSIS — M899 Disorder of bone, unspecified: Secondary | ICD-10-CM | POA: Diagnosis not present

## 2013-06-24 DIAGNOSIS — M7040 Prepatellar bursitis, unspecified knee: Secondary | ICD-10-CM | POA: Diagnosis not present

## 2013-06-24 DIAGNOSIS — E785 Hyperlipidemia, unspecified: Secondary | ICD-10-CM | POA: Diagnosis not present

## 2013-07-04 DIAGNOSIS — M25559 Pain in unspecified hip: Secondary | ICD-10-CM | POA: Diagnosis not present

## 2013-07-04 DIAGNOSIS — M4120 Other idiopathic scoliosis, site unspecified: Secondary | ICD-10-CM | POA: Diagnosis not present

## 2013-07-04 DIAGNOSIS — M545 Low back pain, unspecified: Secondary | ICD-10-CM | POA: Diagnosis not present

## 2013-07-04 DIAGNOSIS — Z6829 Body mass index (BMI) 29.0-29.9, adult: Secondary | ICD-10-CM | POA: Diagnosis not present

## 2013-07-04 DIAGNOSIS — M5137 Other intervertebral disc degeneration, lumbosacral region: Secondary | ICD-10-CM | POA: Diagnosis not present

## 2013-07-08 DIAGNOSIS — M899 Disorder of bone, unspecified: Secondary | ICD-10-CM | POA: Diagnosis not present

## 2013-07-08 DIAGNOSIS — M949 Disorder of cartilage, unspecified: Secondary | ICD-10-CM | POA: Diagnosis not present

## 2013-08-11 DIAGNOSIS — H2510 Age-related nuclear cataract, unspecified eye: Secondary | ICD-10-CM | POA: Diagnosis not present

## 2013-08-11 DIAGNOSIS — IMO0002 Reserved for concepts with insufficient information to code with codable children: Secondary | ICD-10-CM | POA: Diagnosis not present

## 2013-08-11 DIAGNOSIS — H269 Unspecified cataract: Secondary | ICD-10-CM | POA: Diagnosis not present

## 2013-09-01 DIAGNOSIS — H269 Unspecified cataract: Secondary | ICD-10-CM | POA: Diagnosis not present

## 2013-09-01 DIAGNOSIS — H2510 Age-related nuclear cataract, unspecified eye: Secondary | ICD-10-CM | POA: Diagnosis not present

## 2013-09-01 DIAGNOSIS — IMO0002 Reserved for concepts with insufficient information to code with codable children: Secondary | ICD-10-CM | POA: Diagnosis not present

## 2013-09-16 DIAGNOSIS — L918 Other hypertrophic disorders of the skin: Secondary | ICD-10-CM | POA: Diagnosis not present

## 2013-09-16 DIAGNOSIS — L819 Disorder of pigmentation, unspecified: Secondary | ICD-10-CM | POA: Diagnosis not present

## 2013-09-16 DIAGNOSIS — D239 Other benign neoplasm of skin, unspecified: Secondary | ICD-10-CM | POA: Diagnosis not present

## 2013-09-16 DIAGNOSIS — L608 Other nail disorders: Secondary | ICD-10-CM | POA: Diagnosis not present

## 2013-09-16 DIAGNOSIS — L570 Actinic keratosis: Secondary | ICD-10-CM | POA: Diagnosis not present

## 2013-09-16 DIAGNOSIS — L821 Other seborrheic keratosis: Secondary | ICD-10-CM | POA: Diagnosis not present

## 2013-09-16 DIAGNOSIS — L908 Other atrophic disorders of skin: Secondary | ICD-10-CM | POA: Diagnosis not present

## 2013-09-18 ENCOUNTER — Ambulatory Visit (INDEPENDENT_AMBULATORY_CARE_PROVIDER_SITE_OTHER): Payer: Medicare Other | Admitting: Podiatry

## 2013-09-18 ENCOUNTER — Ambulatory Visit (INDEPENDENT_AMBULATORY_CARE_PROVIDER_SITE_OTHER): Payer: Medicare Other

## 2013-09-18 ENCOUNTER — Encounter: Payer: Self-pay | Admitting: Podiatry

## 2013-09-18 VITALS — BP 151/65 | HR 67 | Resp 15 | Ht 63.5 in | Wt 165.0 lb

## 2013-09-18 DIAGNOSIS — S99929A Unspecified injury of unspecified foot, initial encounter: Secondary | ICD-10-CM

## 2013-09-18 DIAGNOSIS — S99921A Unspecified injury of right foot, initial encounter: Secondary | ICD-10-CM

## 2013-09-18 DIAGNOSIS — S99919A Unspecified injury of unspecified ankle, initial encounter: Secondary | ICD-10-CM

## 2013-09-18 DIAGNOSIS — S8990XA Unspecified injury of unspecified lower leg, initial encounter: Secondary | ICD-10-CM

## 2013-09-18 DIAGNOSIS — L03039 Cellulitis of unspecified toe: Secondary | ICD-10-CM

## 2013-09-18 NOTE — Progress Notes (Signed)
Subjective:     Patient ID: Megan Pitts, female   DOB: May 15, 1927, 80 y.o.   MRN: [id number]  HPI patient states that she traumatized her right big toe and has developed a significant blister on the big toe that becomes painful and she is very worried could become a blood clot   Review of Systems     Objective:   Physical Exam Neurovascular status intact with muscle strength adequate and range of motion subtalar midtarsal joint mildly diminished. Patient's found to have a traumatized right hallux with a large abscess which is formed at the interphalangeal joint with blood formation of the localized nature. There is some bruising of the hallux second and third toes but it is all localized    Assessment:     Abscess of the right hallux interphalangeal joint with pain    Plan:     H&P and x-ray reviewed. Today I did a proximal nerve block 60 mg Xylocaine Marcaine mixture and I went ahead and using 22-gauge needle I removed the abscess applied sterile dressing with Silvadene instructed on soaks and reappoint her recheck

## 2013-09-18 NOTE — Progress Notes (Signed)
   Subjective:    Patient ID: Megan Pitts, female    DOB: 1927/07/01, 80 y.o.   MRN: [id number]  HPI Comments: Pt states she stumbled yesterday on the 1st step and hit her right forefoot.  Pt presents with swelling and bruising to the right 1, 2nd toes significantly and bruising across the 3 - 4th metatarsals, and a blood blister to the right 1st toe.  Pt states treated with rest and ice only.  Foot Injury       Review of Systems  Eyes:       Cataract surgery - 08/11/2013 and 09/01/2013   All other systems reviewed and are negative.      Objective:   Physical Exam        Assessment & Plan:

## 2013-09-30 DIAGNOSIS — I10 Essential (primary) hypertension: Secondary | ICD-10-CM | POA: Diagnosis not present

## 2013-09-30 DIAGNOSIS — Z6830 Body mass index (BMI) 30.0-30.9, adult: Secondary | ICD-10-CM | POA: Diagnosis not present

## 2013-09-30 DIAGNOSIS — M899 Disorder of bone, unspecified: Secondary | ICD-10-CM | POA: Diagnosis not present

## 2013-09-30 DIAGNOSIS — N318 Other neuromuscular dysfunction of bladder: Secondary | ICD-10-CM | POA: Diagnosis not present

## 2013-09-30 DIAGNOSIS — Z23 Encounter for immunization: Secondary | ICD-10-CM | POA: Diagnosis not present

## 2013-09-30 DIAGNOSIS — Z1331 Encounter for screening for depression: Secondary | ICD-10-CM | POA: Diagnosis not present

## 2013-09-30 DIAGNOSIS — M949 Disorder of cartilage, unspecified: Secondary | ICD-10-CM | POA: Diagnosis not present

## 2013-09-30 DIAGNOSIS — R7301 Impaired fasting glucose: Secondary | ICD-10-CM | POA: Diagnosis not present

## 2013-09-30 DIAGNOSIS — E785 Hyperlipidemia, unspecified: Secondary | ICD-10-CM | POA: Diagnosis not present

## 2013-10-08 DIAGNOSIS — D2330 Other benign neoplasm of skin of unspecified part of face: Secondary | ICD-10-CM | POA: Diagnosis not present

## 2013-10-08 DIAGNOSIS — D485 Neoplasm of uncertain behavior of skin: Secondary | ICD-10-CM | POA: Diagnosis not present

## 2013-10-30 DIAGNOSIS — M8580 Other specified disorders of bone density and structure, unspecified site: Secondary | ICD-10-CM | POA: Diagnosis not present

## 2013-10-30 DIAGNOSIS — Z6830 Body mass index (BMI) 30.0-30.9, adult: Secondary | ICD-10-CM | POA: Diagnosis not present

## 2013-11-21 DIAGNOSIS — M25562 Pain in left knee: Secondary | ICD-10-CM | POA: Diagnosis not present

## 2013-11-25 DIAGNOSIS — M6281 Muscle weakness (generalized): Secondary | ICD-10-CM | POA: Diagnosis not present

## 2013-11-25 DIAGNOSIS — M25562 Pain in left knee: Secondary | ICD-10-CM | POA: Diagnosis not present

## 2013-11-25 DIAGNOSIS — M222X2 Patellofemoral disorders, left knee: Secondary | ICD-10-CM | POA: Diagnosis not present

## 2013-11-26 DIAGNOSIS — M6281 Muscle weakness (generalized): Secondary | ICD-10-CM | POA: Diagnosis not present

## 2013-11-26 DIAGNOSIS — M222X2 Patellofemoral disorders, left knee: Secondary | ICD-10-CM | POA: Diagnosis not present

## 2013-11-26 DIAGNOSIS — M25562 Pain in left knee: Secondary | ICD-10-CM | POA: Diagnosis not present

## 2013-12-01 DIAGNOSIS — M6281 Muscle weakness (generalized): Secondary | ICD-10-CM | POA: Diagnosis not present

## 2013-12-01 DIAGNOSIS — M222X2 Patellofemoral disorders, left knee: Secondary | ICD-10-CM | POA: Diagnosis not present

## 2013-12-01 DIAGNOSIS — M25562 Pain in left knee: Secondary | ICD-10-CM | POA: Diagnosis not present

## 2013-12-03 DIAGNOSIS — M6281 Muscle weakness (generalized): Secondary | ICD-10-CM | POA: Diagnosis not present

## 2013-12-03 DIAGNOSIS — M25562 Pain in left knee: Secondary | ICD-10-CM | POA: Diagnosis not present

## 2013-12-03 DIAGNOSIS — M222X2 Patellofemoral disorders, left knee: Secondary | ICD-10-CM | POA: Diagnosis not present

## 2013-12-08 ENCOUNTER — Other Ambulatory Visit: Payer: Self-pay | Admitting: Cardiology

## 2013-12-09 DIAGNOSIS — M25562 Pain in left knee: Secondary | ICD-10-CM | POA: Diagnosis not present

## 2013-12-09 DIAGNOSIS — M6281 Muscle weakness (generalized): Secondary | ICD-10-CM | POA: Diagnosis not present

## 2013-12-09 DIAGNOSIS — M222X2 Patellofemoral disorders, left knee: Secondary | ICD-10-CM | POA: Diagnosis not present

## 2013-12-11 DIAGNOSIS — M6281 Muscle weakness (generalized): Secondary | ICD-10-CM | POA: Diagnosis not present

## 2013-12-11 DIAGNOSIS — M222X2 Patellofemoral disorders, left knee: Secondary | ICD-10-CM | POA: Diagnosis not present

## 2013-12-11 DIAGNOSIS — M25562 Pain in left knee: Secondary | ICD-10-CM | POA: Diagnosis not present

## 2013-12-19 DIAGNOSIS — M25562 Pain in left knee: Secondary | ICD-10-CM | POA: Diagnosis not present

## 2014-01-07 ENCOUNTER — Ambulatory Visit (INDEPENDENT_AMBULATORY_CARE_PROVIDER_SITE_OTHER): Payer: Medicare Other | Admitting: Cardiology

## 2014-01-07 ENCOUNTER — Encounter: Payer: Self-pay | Admitting: Cardiology

## 2014-01-07 VITALS — BP 138/60 | HR 64 | Ht 63.0 in | Wt 168.6 lb

## 2014-01-07 DIAGNOSIS — E785 Hyperlipidemia, unspecified: Secondary | ICD-10-CM | POA: Diagnosis not present

## 2014-01-07 DIAGNOSIS — Z954 Presence of other heart-valve replacement: Secondary | ICD-10-CM

## 2014-01-07 DIAGNOSIS — Z952 Presence of prosthetic heart valve: Secondary | ICD-10-CM

## 2014-01-07 DIAGNOSIS — I10 Essential (primary) hypertension: Secondary | ICD-10-CM | POA: Diagnosis not present

## 2014-01-07 NOTE — Progress Notes (Signed)
Megan Pitts Date of Birth: 03-10-1927 Medical Record #[id number]  History of Present Illness: Megan Pitts is seen back today for a follow up AVR. She has a history of severe AS and had AVR with a #47mm [place] stentless porcine valve in July of 2007. Echo in 12/14 showed normal valve function. Other issues include HTN, PVCs, HLD, post op atrial fib, GERD, Gilbert's syndrome, and carotid disease.  On follow up today she is doing very well. She denies any chest pain or shortness of breath. She has no palpitations. She still works at [employer].   Current Outpatient Prescriptions  Medication Sig Dispense Refill  . amLODipine (NORVASC) 5 MG tablet Take 5 mg by mouth daily. 1/2 daily    . aspirin 81 MG tablet Take 81 mg by mouth daily.      Marland Kitchen atenolol (TENORMIN) 25 MG tablet TAKE 1 TABLET ONCE DAILY. 30 tablet 1  . MYRBETRIQ 25 MG TB24 tablet Take 25 mg by mouth daily.   5  . NON FORMULARY Vitamin d injection once a week    . rosuvastatin (CRESTOR) 40 MG tablet Take 20 mg by mouth daily.     . valsartan-hydrochlorothiazide (DIOVAN-HCT) 320-25 MG per tablet Take 1 tablet by mouth daily.     No current facility-administered medications for this visit.    Allergies  Allergen Reactions  . Synthroid [Levothyroxine Sodium]     Past Medical History  Diagnosis Date  . Aortic stenosis     s/p AVR 07/2005(porcine)  . PVC's (premature ventricular contractions)   . Hypertension   . Hyperlipidemia   . Phlebitis     UPPER EXTREMITY  . A-fib   . GERD (gastroesophageal reflux disease)   . Gilbert's syndrome   . Shingles   . IFG (impaired fasting glucose)   . OAB (overactive bladder)   . Osteopenia   . Varicose veins     s/p treatment   . Carotid bruit 09/2008    <50% B  . Vertigo     Past Surgical History  Procedure Laterality Date  . Cardiac catheterization  07/25/2005    EF 60% THE AORTIC VALVE IS HEAVILY CALCIFIED WITH REDUCED OPENING. NO MVP  . Aortic valve replacement  08/01/2005     WITH A #23MM [place] STENTLESS PORCINE AORTIC VALVE  . US echocardiography  04/14/2008    EF 55-60%. NORMAL  . US echocardiography  11/21/2005    EF 55-60%  . US echocardiography  07/20/2005    EF 55-60%  . US echocardiography  10/19/2004    EF 55-60%  . Abdominal hysterectomy  1963    partial  . Hemorrhoid surgery  1980's  . Foot surgery    . Hand surgery    . Varicose vein surgery  2010    laser treatment    History  Smoking status  . Never Smoker   Smokeless tobacco  . Never Used    History  Alcohol Use No    Family History  Problem Relation Age of Onset  . Pneumonia Mother 63  . Alzheimer's disease Father   . Leukemia Brother   . Alcohol abuse Brother     Review of Systems: The review of systems is per the HPI.  All other systems were reviewed and are negative.  Physical Exam: BP 138/60 mmHg  Pulse 64  Ht 5\' 3"  (1.6 m)  Wt 168 lb 9.6 oz (76.476 kg)  BMI 29.87 kg/m2 Patient is very pleasant and in no acute distress.  Looks  younger than her stated age. Skin is warm and dry. Color is normal.  HEENT is unremarkable. Normocephalic/atraumatic. PERRL. Sclera are nonicteric. Neck is supple. No masses. No JVD. Lungs are clear. Cardiac exam shows a regular rate and rhythm. 2/6 systolic outflow murmur noted. Abdomen is soft. Extremities are without edema. Gait and ROM are intact. No gross neurologic deficits noted.  LABORATORY DATA: Ecg today. NSR with rate 64 bpm. Poor R wave progression. I have personally reviewed and interpreted this study.  Assessment / Plan: 1. S/P AVR with tissue prosthesis in 2007- doing very well.   2. HTN - controlled. Continue current therapy  3. Hyperlipidemia-on Crestor.   Overall she is doing well. See her back in one year.

## 2014-01-07 NOTE — Patient Instructions (Signed)
Continue your current therapy  I will see you in one year   

## 2014-01-16 DIAGNOSIS — M25562 Pain in left knee: Secondary | ICD-10-CM | POA: Diagnosis not present

## 2014-01-16 DIAGNOSIS — M2392 Unspecified internal derangement of left knee: Secondary | ICD-10-CM | POA: Diagnosis not present

## 2014-01-22 DIAGNOSIS — H40013 Open angle with borderline findings, low risk, bilateral: Secondary | ICD-10-CM | POA: Diagnosis not present

## 2014-01-24 DIAGNOSIS — M25562 Pain in left knee: Secondary | ICD-10-CM | POA: Diagnosis not present

## 2014-01-29 DIAGNOSIS — S83282A Other tear of lateral meniscus, current injury, left knee, initial encounter: Secondary | ICD-10-CM | POA: Diagnosis not present

## 2014-01-29 DIAGNOSIS — M25562 Pain in left knee: Secondary | ICD-10-CM | POA: Diagnosis not present

## 2014-02-10 ENCOUNTER — Other Ambulatory Visit: Payer: Self-pay | Admitting: Cardiology

## 2014-03-03 DIAGNOSIS — M25562 Pain in left knee: Secondary | ICD-10-CM | POA: Diagnosis not present

## 2014-03-03 DIAGNOSIS — M2392 Unspecified internal derangement of left knee: Secondary | ICD-10-CM | POA: Diagnosis not present

## 2014-03-03 DIAGNOSIS — S83282A Other tear of lateral meniscus, current injury, left knee, initial encounter: Secondary | ICD-10-CM | POA: Diagnosis not present

## 2014-03-17 ENCOUNTER — Other Ambulatory Visit: Payer: Self-pay | Admitting: Dermatology

## 2014-03-17 DIAGNOSIS — D2272 Melanocytic nevi of left lower limb, including hip: Secondary | ICD-10-CM | POA: Diagnosis not present

## 2014-03-17 DIAGNOSIS — D2271 Melanocytic nevi of right lower limb, including hip: Secondary | ICD-10-CM | POA: Diagnosis not present

## 2014-03-17 DIAGNOSIS — L905 Scar conditions and fibrosis of skin: Secondary | ICD-10-CM | POA: Diagnosis not present

## 2014-03-17 DIAGNOSIS — L821 Other seborrheic keratosis: Secondary | ICD-10-CM | POA: Diagnosis not present

## 2014-03-17 DIAGNOSIS — D485 Neoplasm of uncertain behavior of skin: Secondary | ICD-10-CM | POA: Diagnosis not present

## 2014-03-31 DIAGNOSIS — M2392 Unspecified internal derangement of left knee: Secondary | ICD-10-CM | POA: Diagnosis not present

## 2014-03-31 DIAGNOSIS — S83282D Other tear of lateral meniscus, current injury, left knee, subsequent encounter: Secondary | ICD-10-CM | POA: Diagnosis not present

## 2014-03-31 DIAGNOSIS — M25562 Pain in left knee: Secondary | ICD-10-CM | POA: Diagnosis not present

## 2014-04-16 ENCOUNTER — Other Ambulatory Visit: Payer: Self-pay | Admitting: Cardiology

## 2014-04-16 DIAGNOSIS — E785 Hyperlipidemia, unspecified: Secondary | ICD-10-CM | POA: Diagnosis not present

## 2014-04-16 DIAGNOSIS — Z0000 Encounter for general adult medical examination without abnormal findings: Secondary | ICD-10-CM | POA: Diagnosis not present

## 2014-04-16 DIAGNOSIS — M859 Disorder of bone density and structure, unspecified: Secondary | ICD-10-CM | POA: Diagnosis not present

## 2014-04-16 DIAGNOSIS — R7301 Impaired fasting glucose: Secondary | ICD-10-CM | POA: Diagnosis not present

## 2014-04-16 DIAGNOSIS — E039 Hypothyroidism, unspecified: Secondary | ICD-10-CM | POA: Diagnosis not present

## 2014-04-16 DIAGNOSIS — I10 Essential (primary) hypertension: Secondary | ICD-10-CM | POA: Diagnosis not present

## 2014-04-23 ENCOUNTER — Other Ambulatory Visit: Payer: Self-pay

## 2014-04-23 ENCOUNTER — Other Ambulatory Visit: Payer: Self-pay | Admitting: Internal Medicine

## 2014-04-23 DIAGNOSIS — R7301 Impaired fasting glucose: Secondary | ICD-10-CM | POA: Diagnosis not present

## 2014-04-23 DIAGNOSIS — Z1231 Encounter for screening mammogram for malignant neoplasm of breast: Secondary | ICD-10-CM

## 2014-04-23 DIAGNOSIS — Z1389 Encounter for screening for other disorder: Secondary | ICD-10-CM | POA: Diagnosis not present

## 2014-04-23 DIAGNOSIS — E785 Hyperlipidemia, unspecified: Secondary | ICD-10-CM | POA: Diagnosis not present

## 2014-04-23 DIAGNOSIS — M859 Disorder of bone density and structure, unspecified: Secondary | ICD-10-CM | POA: Diagnosis not present

## 2014-04-23 DIAGNOSIS — Z6829 Body mass index (BMI) 29.0-29.9, adult: Secondary | ICD-10-CM | POA: Diagnosis not present

## 2014-04-23 DIAGNOSIS — Z0000 Encounter for general adult medical examination without abnormal findings: Secondary | ICD-10-CM | POA: Diagnosis not present

## 2014-04-23 DIAGNOSIS — E039 Hypothyroidism, unspecified: Secondary | ICD-10-CM | POA: Diagnosis not present

## 2014-04-23 DIAGNOSIS — G3184 Mild cognitive impairment, so stated: Secondary | ICD-10-CM | POA: Diagnosis not present

## 2014-04-23 DIAGNOSIS — I10 Essential (primary) hypertension: Secondary | ICD-10-CM | POA: Diagnosis not present

## 2014-04-23 DIAGNOSIS — G629 Polyneuropathy, unspecified: Secondary | ICD-10-CM | POA: Diagnosis not present

## 2014-04-23 DIAGNOSIS — N3281 Overactive bladder: Secondary | ICD-10-CM | POA: Diagnosis not present

## 2014-04-27 ENCOUNTER — Encounter: Payer: Self-pay | Admitting: Cardiology

## 2014-05-05 ENCOUNTER — Ambulatory Visit
Admission: RE | Admit: 2014-05-05 | Discharge: 2014-05-05 | Disposition: A | Discharge status: Home / Self Care | Payer: Medicare Other | Source: Ambulatory Visit

## 2014-05-05 DIAGNOSIS — Z1212 Encounter for screening for malignant neoplasm of rectum: Secondary | ICD-10-CM | POA: Diagnosis not present

## 2014-05-05 DIAGNOSIS — Z1231 Encounter for screening mammogram for malignant neoplasm of breast: Secondary | ICD-10-CM | POA: Diagnosis not present

## 2014-06-02 DIAGNOSIS — M25562 Pain in left knee: Secondary | ICD-10-CM | POA: Diagnosis not present

## 2014-06-02 DIAGNOSIS — S83282D Other tear of lateral meniscus, current injury, left knee, subsequent encounter: Secondary | ICD-10-CM | POA: Diagnosis not present

## 2014-06-23 DIAGNOSIS — S83282D Other tear of lateral meniscus, current injury, left knee, subsequent encounter: Secondary | ICD-10-CM | POA: Diagnosis not present

## 2014-06-23 DIAGNOSIS — M2392 Unspecified internal derangement of left knee: Secondary | ICD-10-CM | POA: Diagnosis not present

## 2014-06-23 DIAGNOSIS — M25562 Pain in left knee: Secondary | ICD-10-CM | POA: Diagnosis not present

## 2014-06-23 DIAGNOSIS — M6281 Muscle weakness (generalized): Secondary | ICD-10-CM | POA: Diagnosis not present

## 2014-06-29 DIAGNOSIS — M2392 Unspecified internal derangement of left knee: Secondary | ICD-10-CM | POA: Diagnosis not present

## 2014-06-29 DIAGNOSIS — M25562 Pain in left knee: Secondary | ICD-10-CM | POA: Diagnosis not present

## 2014-06-29 DIAGNOSIS — S83282D Other tear of lateral meniscus, current injury, left knee, subsequent encounter: Secondary | ICD-10-CM | POA: Diagnosis not present

## 2014-06-29 DIAGNOSIS — M6281 Muscle weakness (generalized): Secondary | ICD-10-CM | POA: Diagnosis not present

## 2014-07-09 DIAGNOSIS — M6281 Muscle weakness (generalized): Secondary | ICD-10-CM | POA: Diagnosis not present

## 2014-07-09 DIAGNOSIS — M2392 Unspecified internal derangement of left knee: Secondary | ICD-10-CM | POA: Diagnosis not present

## 2014-07-09 DIAGNOSIS — S83282D Other tear of lateral meniscus, current injury, left knee, subsequent encounter: Secondary | ICD-10-CM | POA: Diagnosis not present

## 2014-07-09 DIAGNOSIS — M25562 Pain in left knee: Secondary | ICD-10-CM | POA: Diagnosis not present

## 2014-07-14 DIAGNOSIS — M25562 Pain in left knee: Secondary | ICD-10-CM | POA: Diagnosis not present

## 2014-07-14 DIAGNOSIS — S83282D Other tear of lateral meniscus, current injury, left knee, subsequent encounter: Secondary | ICD-10-CM | POA: Diagnosis not present

## 2014-07-16 DIAGNOSIS — H3531 Nonexudative age-related macular degeneration: Secondary | ICD-10-CM | POA: Diagnosis not present

## 2014-07-21 DIAGNOSIS — M6281 Muscle weakness (generalized): Secondary | ICD-10-CM | POA: Diagnosis not present

## 2014-07-21 DIAGNOSIS — M2392 Unspecified internal derangement of left knee: Secondary | ICD-10-CM | POA: Diagnosis not present

## 2014-07-21 DIAGNOSIS — S83282D Other tear of lateral meniscus, current injury, left knee, subsequent encounter: Secondary | ICD-10-CM | POA: Diagnosis not present

## 2014-07-21 DIAGNOSIS — M25562 Pain in left knee: Secondary | ICD-10-CM | POA: Diagnosis not present

## 2014-07-27 DIAGNOSIS — R2689 Other abnormalities of gait and mobility: Secondary | ICD-10-CM | POA: Diagnosis not present

## 2014-07-27 DIAGNOSIS — G629 Polyneuropathy, unspecified: Secondary | ICD-10-CM | POA: Diagnosis not present

## 2014-07-27 DIAGNOSIS — N3941 Urge incontinence: Secondary | ICD-10-CM | POA: Diagnosis not present

## 2014-07-27 DIAGNOSIS — H8110 Benign paroxysmal vertigo, unspecified ear: Secondary | ICD-10-CM | POA: Diagnosis not present

## 2014-07-27 DIAGNOSIS — I10 Essential (primary) hypertension: Secondary | ICD-10-CM | POA: Diagnosis not present

## 2014-07-27 DIAGNOSIS — N3281 Overactive bladder: Secondary | ICD-10-CM | POA: Diagnosis not present

## 2014-07-27 DIAGNOSIS — Z6830 Body mass index (BMI) 30.0-30.9, adult: Secondary | ICD-10-CM | POA: Diagnosis not present

## 2014-07-27 DIAGNOSIS — R5383 Other fatigue: Secondary | ICD-10-CM | POA: Diagnosis not present

## 2014-07-27 DIAGNOSIS — E039 Hypothyroidism, unspecified: Secondary | ICD-10-CM | POA: Diagnosis not present

## 2014-08-11 DIAGNOSIS — S83282D Other tear of lateral meniscus, current injury, left knee, subsequent encounter: Secondary | ICD-10-CM | POA: Diagnosis not present

## 2014-08-11 DIAGNOSIS — M25562 Pain in left knee: Secondary | ICD-10-CM | POA: Diagnosis not present

## 2014-08-11 DIAGNOSIS — M2392 Unspecified internal derangement of left knee: Secondary | ICD-10-CM | POA: Diagnosis not present

## 2014-08-18 DIAGNOSIS — R5383 Other fatigue: Secondary | ICD-10-CM | POA: Diagnosis not present

## 2014-09-08 DIAGNOSIS — S83282D Other tear of lateral meniscus, current injury, left knee, subsequent encounter: Secondary | ICD-10-CM | POA: Diagnosis not present

## 2014-09-08 DIAGNOSIS — M25562 Pain in left knee: Secondary | ICD-10-CM | POA: Diagnosis not present

## 2014-10-10 ENCOUNTER — Other Ambulatory Visit: Payer: Self-pay | Admitting: Cardiology

## 2014-10-22 DIAGNOSIS — E785 Hyperlipidemia, unspecified: Secondary | ICD-10-CM | POA: Diagnosis not present

## 2014-10-22 DIAGNOSIS — G3184 Mild cognitive impairment, so stated: Secondary | ICD-10-CM | POA: Diagnosis not present

## 2014-10-22 DIAGNOSIS — I10 Essential (primary) hypertension: Secondary | ICD-10-CM | POA: Diagnosis not present

## 2014-10-22 DIAGNOSIS — Z23 Encounter for immunization: Secondary | ICD-10-CM | POA: Diagnosis not present

## 2014-10-22 DIAGNOSIS — Z6830 Body mass index (BMI) 30.0-30.9, adult: Secondary | ICD-10-CM | POA: Diagnosis not present

## 2014-10-22 DIAGNOSIS — R7301 Impaired fasting glucose: Secondary | ICD-10-CM | POA: Diagnosis not present

## 2014-10-22 DIAGNOSIS — G629 Polyneuropathy, unspecified: Secondary | ICD-10-CM | POA: Diagnosis not present

## 2014-10-27 DIAGNOSIS — D2262 Melanocytic nevi of left upper limb, including shoulder: Secondary | ICD-10-CM | POA: Diagnosis not present

## 2014-10-27 DIAGNOSIS — D225 Melanocytic nevi of trunk: Secondary | ICD-10-CM | POA: Diagnosis not present

## 2014-10-27 DIAGNOSIS — L821 Other seborrheic keratosis: Secondary | ICD-10-CM | POA: Diagnosis not present

## 2014-10-27 DIAGNOSIS — L570 Actinic keratosis: Secondary | ICD-10-CM | POA: Diagnosis not present

## 2014-10-27 DIAGNOSIS — D2271 Melanocytic nevi of right lower limb, including hip: Secondary | ICD-10-CM | POA: Diagnosis not present

## 2014-10-27 DIAGNOSIS — L218 Other seborrheic dermatitis: Secondary | ICD-10-CM | POA: Diagnosis not present

## 2014-10-27 DIAGNOSIS — D2272 Melanocytic nevi of left lower limb, including hip: Secondary | ICD-10-CM | POA: Diagnosis not present

## 2015-01-15 ENCOUNTER — Ambulatory Visit (INDEPENDENT_AMBULATORY_CARE_PROVIDER_SITE_OTHER): Payer: Medicare Other | Admitting: Cardiology

## 2015-01-15 ENCOUNTER — Encounter: Payer: Self-pay | Admitting: Cardiology

## 2015-01-15 VITALS — BP 130/56 | HR 64 | Ht 63.0 in | Wt 169.0 lb

## 2015-01-15 DIAGNOSIS — Z952 Presence of prosthetic heart valve: Secondary | ICD-10-CM

## 2015-01-15 DIAGNOSIS — Z954 Presence of other heart-valve replacement: Secondary | ICD-10-CM | POA: Diagnosis not present

## 2015-01-15 DIAGNOSIS — E785 Hyperlipidemia, unspecified: Secondary | ICD-10-CM | POA: Diagnosis not present

## 2015-01-15 DIAGNOSIS — I10 Essential (primary) hypertension: Secondary | ICD-10-CM | POA: Diagnosis not present

## 2015-01-15 NOTE — Progress Notes (Signed)
Megan Pitts Date of Birth: 1927/02/24 Medical Record [id number]  History of Present Illness: Megan Pitts is seen back today for a follow up AVR. She has a history of severe AS and had AVR with a #35mm [place] stentless porcine valve in July of 2007. Echo in 12/14 showed normal valve function. Other issues include HTN, PVCs, HLD, post op atrial fib, GERD, Gilbert's syndrome, and carotid disease.  On follow up today she is doing very well. She denies any chest pain or shortness of breath.  She still works at [employer] part time. Now on Aricept for memory issues. Notes rare sensation of heart racing at night only lasting for one minute.   Current Outpatient Prescriptions  Medication Sig Dispense Refill  . amLODipine (NORVASC) 5 MG tablet Take 5 mg by mouth daily. 1/2 daily    . aspirin 81 MG tablet Take 81 mg by mouth daily.      Marland Kitchen atenolol (TENORMIN) 25 MG tablet TAKE 1 TABLET ONCE DAILY. 30 tablet 4  . donepezil (ARICEPT) 10 MG tablet Take 10 mg by mouth at bedtime.   10  . Multiple Vitamin (MULTIVITAMIN) tablet Take 1 tablet by mouth daily.    Marland Kitchen MYRBETRIQ 25 MG TB24 tablet Take 25 mg by mouth daily.   5  . NON FORMULARY Vitamin d injection once a week    . rosuvastatin (CRESTOR) 40 MG tablet Take 20 mg by mouth daily.     . valsartan-hydrochlorothiazide (DIOVAN-HCT) 320-25 MG per tablet Take 1 tablet by mouth daily.     No current facility-administered medications for this visit.    Allergies  Allergen Reactions  . Synthroid [Levothyroxine Sodium]     Pt stated not sure what happens when she takes this    Past Medical History  Diagnosis Date  . Aortic stenosis     s/p AVR 07/2005(porcine)  . PVC's (premature ventricular contractions)   . Hypertension   . Hyperlipidemia   . Phlebitis     UPPER EXTREMITY  . A-fib ([place])   . GERD (gastroesophageal reflux disease)   . Gilbert's syndrome   . Shingles   . IFG (impaired fasting glucose)   . OAB (overactive bladder)   .  Osteopenia   . Varicose veins     s/p treatment   . Carotid bruit 09/2008    <50% B  . Vertigo     Past Surgical History  Procedure Laterality Date  . Cardiac catheterization  07/25/2005    EF 60% THE AORTIC VALVE IS HEAVILY CALCIFIED WITH REDUCED OPENING. NO MVP  . Aortic valve replacement  08/01/2005    WITH A #23MM [place] STENTLESS PORCINE AORTIC VALVE  . US echocardiography  04/14/2008    EF 55-60%. NORMAL  . US echocardiography  11/21/2005    EF 55-60%  . US echocardiography  07/20/2005    EF 55-60%  . US echocardiography  10/19/2004    EF 55-60%  . Abdominal hysterectomy  1963    partial  . Hemorrhoid surgery  1980's  . Foot surgery    . Hand surgery    . Varicose vein surgery  2010    laser treatment    History  Smoking status  . Never Smoker   Smokeless tobacco  . Never Used    History  Alcohol Use No    Family History  Problem Relation Age of Onset  . Pneumonia Mother 8  . Alzheimer's disease Father   . Leukemia Brother   .  Alcohol abuse Brother     Review of Systems: The review of systems is per the HPI.  All other systems were reviewed and are negative.  Physical Exam: BP 130/56 mmHg  Pulse 64  Ht 5\' 3"  (1.6 m)  Wt 76.658 kg (169 lb)  BMI 29.94 kg/m2 Patient is very pleasant and in no acute distress. Looks  younger than her stated age. Skin is warm and dry. Color is normal.  HEENT is unremarkable. Normocephalic/atraumatic. PERRL. Sclera are nonicteric. Neck is supple. No masses. No JVD. Lungs are clear. Cardiac exam shows a regular rate and rhythm. 2/6 systolic outflow murmur noted. Abdomen is soft. Extremities are without edema. Gait and ROM are intact. No gross neurologic deficits noted.  LABORATORY DATA: Ecg today. NSR with rate 64 bpm. LAE. Otherwise normal. I have personally reviewed and interpreted this study.  Assessment / Plan: 1. S/P AVR with tissue prosthesis in 2007- doing very well. Echo 12/14 was satisfactory.  2. HTN -  controlled. Continue current therapy  3. Hyperlipidemia-on Crestor.   Overall she is doing well. See her back in one year.

## 2015-01-15 NOTE — Patient Instructions (Signed)
Continue your current therapy  I will see you in one year   

## 2015-03-16 DIAGNOSIS — R05 Cough: Secondary | ICD-10-CM | POA: Diagnosis not present

## 2015-03-16 DIAGNOSIS — Z6830 Body mass index (BMI) 30.0-30.9, adult: Secondary | ICD-10-CM | POA: Diagnosis not present

## 2015-03-23 DIAGNOSIS — H20011 Primary iridocyclitis, right eye: Secondary | ICD-10-CM | POA: Diagnosis not present

## 2015-04-06 DIAGNOSIS — H40053 Ocular hypertension, bilateral: Secondary | ICD-10-CM | POA: Diagnosis not present

## 2015-04-06 DIAGNOSIS — H40013 Open angle with borderline findings, low risk, bilateral: Secondary | ICD-10-CM | POA: Diagnosis not present

## 2015-04-30 DIAGNOSIS — R7301 Impaired fasting glucose: Secondary | ICD-10-CM | POA: Diagnosis not present

## 2015-04-30 DIAGNOSIS — R8299 Other abnormal findings in urine: Secondary | ICD-10-CM | POA: Diagnosis not present

## 2015-04-30 DIAGNOSIS — E038 Other specified hypothyroidism: Secondary | ICD-10-CM | POA: Diagnosis not present

## 2015-04-30 DIAGNOSIS — M859 Disorder of bone density and structure, unspecified: Secondary | ICD-10-CM | POA: Diagnosis not present

## 2015-04-30 DIAGNOSIS — I10 Essential (primary) hypertension: Secondary | ICD-10-CM | POA: Diagnosis not present

## 2015-04-30 DIAGNOSIS — N390 Urinary tract infection, site not specified: Secondary | ICD-10-CM | POA: Diagnosis not present

## 2015-04-30 DIAGNOSIS — E784 Other hyperlipidemia: Secondary | ICD-10-CM | POA: Diagnosis not present

## 2015-05-05 ENCOUNTER — Emergency Department (HOSPITAL_COMMUNITY): Payer: Medicare Other

## 2015-05-05 ENCOUNTER — Inpatient Hospital Stay (HOSPITAL_COMMUNITY)
Admission: EM | Admit: 2015-05-05 | Discharge: 2015-05-06 | DRG: 603 | Disposition: A | Discharge status: Home / Self Care | Payer: Medicare Other | Attending: Family Medicine | Admitting: Family Medicine

## 2015-05-05 ENCOUNTER — Encounter (HOSPITAL_COMMUNITY)
Admission: EM | Disposition: A | Discharge status: Home / Self Care | Payer: Self-pay | Source: Home / Self Care | Attending: Family Medicine

## 2015-05-05 ENCOUNTER — Encounter (HOSPITAL_COMMUNITY): Payer: Self-pay | Admitting: Family Medicine

## 2015-05-05 DIAGNOSIS — Z953 Presence of xenogenic heart valve: Secondary | ICD-10-CM

## 2015-05-05 DIAGNOSIS — R001 Bradycardia, unspecified: Secondary | ICD-10-CM | POA: Diagnosis present

## 2015-05-05 DIAGNOSIS — I10 Essential (primary) hypertension: Secondary | ICD-10-CM | POA: Diagnosis not present

## 2015-05-05 DIAGNOSIS — L03113 Cellulitis of right upper limb: Principal | ICD-10-CM | POA: Diagnosis present

## 2015-05-05 DIAGNOSIS — Z7982 Long term (current) use of aspirin: Secondary | ICD-10-CM | POA: Diagnosis not present

## 2015-05-05 DIAGNOSIS — E785 Hyperlipidemia, unspecified: Secondary | ICD-10-CM | POA: Diagnosis present

## 2015-05-05 DIAGNOSIS — L0390 Cellulitis, unspecified: Secondary | ICD-10-CM | POA: Diagnosis present

## 2015-05-05 DIAGNOSIS — M7989 Other specified soft tissue disorders: Secondary | ICD-10-CM | POA: Diagnosis not present

## 2015-05-05 DIAGNOSIS — Z888 Allergy status to other drugs, medicaments and biological substances status: Secondary | ICD-10-CM

## 2015-05-05 DIAGNOSIS — Z6829 Body mass index (BMI) 29.0-29.9, adult: Secondary | ICD-10-CM | POA: Diagnosis not present

## 2015-05-05 DIAGNOSIS — M25531 Pain in right wrist: Secondary | ICD-10-CM | POA: Diagnosis not present

## 2015-05-05 LAB — CBC WITH DIFFERENTIAL/PLATELET
BASOS ABS: 0 10*3/uL (ref 0.0–0.1)
Basophils Relative: 0 %
EOS ABS: 0 10*3/uL (ref 0.0–0.7)
Eosinophils Relative: 0 %
HCT: 42 % (ref 36.0–46.0)
Hemoglobin: 14.8 g/dL (ref 12.0–15.0)
LYMPHS ABS: 2.3 10*3/uL (ref 0.7–4.0)
Lymphocytes Relative: 15 %
MCH: 30.2 pg (ref 26.0–34.0)
MCHC: 35.2 g/dL (ref 30.0–36.0)
MCV: 85.7 fL (ref 78.0–100.0)
MONOS PCT: 11 %
Monocytes Absolute: 1.7 10*3/uL — ABNORMAL HIGH (ref 0.1–1.0)
Neutro Abs: 11.2 10*3/uL — ABNORMAL HIGH (ref 1.7–7.7)
Neutrophils Relative %: 74 %
PLATELETS: 315 10*3/uL (ref 150–400)
RBC: 4.9 MIL/uL (ref 3.87–5.11)
RDW: 13.2 % (ref 11.5–15.5)
WBC: 15.2 10*3/uL — ABNORMAL HIGH (ref 4.0–10.5)

## 2015-05-05 LAB — BASIC METABOLIC PANEL
Anion gap: 11 (ref 5–15)
BUN: 29 mg/dL — AB (ref 6–20)
CO2: 24 mmol/L (ref 22–32)
CREATININE: 1.1 mg/dL — AB (ref 0.44–1.00)
Calcium: 10.1 mg/dL (ref 8.9–10.3)
Chloride: 101 mmol/L (ref 101–111)
GFR calc Af Amer: 52 mL/min — ABNORMAL LOW (ref >60)
GFR, EST NON AFRICAN AMERICAN: 44 mL/min — AB (ref >60)
Glucose, Bld: 113 mg/dL — ABNORMAL HIGH (ref 65–99)
Potassium: 3.7 mmol/L (ref 3.5–5.1)
SODIUM: 136 mmol/L (ref 135–145)

## 2015-05-05 LAB — C-REACTIVE PROTEIN: CRP: 3.6 mg/dL — AB (ref <1.0)

## 2015-05-05 LAB — SEDIMENTATION RATE: SED RATE: 38 mm/h — AB (ref 0–22)

## 2015-05-05 SURGERY — IRRIGATION AND DEBRIDEMENT EXTREMITY
Anesthesia: General | Laterality: Right

## 2015-05-05 MED ORDER — SODIUM CHLORIDE 0.9% FLUSH: 3.0000 mL | INTRAVENOUS | Status: DC | PRN

## 2015-05-05 MED ORDER — HEPARIN SODIUM (PORCINE) 5000 UNIT/ML IJ SOLN
5000.0000 [IU] | Freq: Three times a day (TID) | INTRAMUSCULAR | Status: DC
  Administered 2015-05-05 – 2015-05-06 (×2): 5000 [IU] via SUBCUTANEOUS
  Filled 2015-05-05 (×2): qty 1

## 2015-05-05 MED ORDER — MORPHINE SULFATE (PF) 4 MG/ML IV SOLN
4.0000 mg | Freq: Once | INTRAVENOUS | Status: AC
  Administered 2015-05-05: 4 mg via INTRAVENOUS
  Filled 2015-05-05: qty 1

## 2015-05-05 MED ORDER — VANCOMYCIN HCL IN DEXTROSE 1-5 GM/200ML-% IV SOLN
1000.0000 mg | Freq: Once | INTRAVENOUS | Status: AC
  Administered 2015-05-05: 1000 mg via INTRAVENOUS
  Filled 2015-05-05: qty 200

## 2015-05-05 MED ORDER — VANCOMYCIN HCL 10 G IV SOLR
1250.0000 mg | INTRAVENOUS | Status: DC
  Administered 2015-05-06: 1250 mg via INTRAVENOUS
  Filled 2015-05-05: qty 1250

## 2015-05-05 MED ORDER — SODIUM CHLORIDE 0.9% FLUSH: 3.0000 mL | Freq: Two times a day (BID) | INTRAVENOUS | Status: DC

## 2015-05-05 MED ORDER — IBUPROFEN 400 MG PO TABS: 400.0000 mg | ORAL_TABLET | Freq: Once | ORAL | Status: DC | PRN

## 2015-05-05 MED ORDER — ACETAMINOPHEN 325 MG PO TABS
325.0000 mg | ORAL_TABLET | Freq: Once | ORAL | Status: AC
  Administered 2015-05-05: 325 mg via ORAL

## 2015-05-05 MED ORDER — VANCOMYCIN HCL IN DEXTROSE 1-5 GM/200ML-% IV SOLN: 1000.0000 mg | Freq: Once | INTRAVENOUS | Status: DC

## 2015-05-05 MED ORDER — ASPIRIN EC 81 MG PO TBEC
81.0000 mg | DELAYED_RELEASE_TABLET | Freq: Every day | ORAL | Status: DC
  Administered 2015-05-06: 81 mg via ORAL
  Filled 2015-05-05: qty 1

## 2015-05-05 MED ORDER — DONEPEZIL HCL 10 MG PO TABS
10.0000 mg | ORAL_TABLET | Freq: Every day | ORAL | Status: DC
  Administered 2015-05-05: 10 mg via ORAL
  Filled 2015-05-05: qty 1

## 2015-05-05 MED ORDER — ROSUVASTATIN CALCIUM 20 MG PO TABS
20.0000 mg | ORAL_TABLET | Freq: Every day | ORAL | Status: DC
  Administered 2015-05-06: 20 mg via ORAL
  Filled 2015-05-05: qty 1

## 2015-05-05 MED ORDER — ADULT MULTIVITAMIN W/MINERALS CH: 1.0000 | ORAL_TABLET | Freq: Every day | ORAL | Status: DC

## 2015-05-05 MED ORDER — SODIUM CHLORIDE 0.9 % IV SOLN: 250.0000 mL | INTRAVENOUS | Status: DC | PRN

## 2015-05-05 MED ORDER — OXYCODONE HCL 5 MG PO TABS: 5.0000 mg | ORAL_TABLET | ORAL | Status: DC | PRN

## 2015-05-05 MED ORDER — CEFAZOLIN SODIUM 1-5 GM-% IV SOLN
1.0000 g | Freq: Three times a day (TID) | INTRAVENOUS | Status: DC
  Administered 2015-05-05 – 2015-05-06 (×2): 1 g via INTRAVENOUS
  Filled 2015-05-05 (×4): qty 50

## 2015-05-05 MED ORDER — ACETAMINOPHEN 325 MG PO TABS
ORAL_TABLET | ORAL | Status: AC
  Filled 2015-05-05: qty 1

## 2015-05-05 MED ORDER — ACETAMINOPHEN 500 MG PO TABS: 500.0000 mg | ORAL_TABLET | Freq: Four times a day (QID) | ORAL | Status: DC | PRN

## 2015-05-05 MED ORDER — ATENOLOL 50 MG PO TABS
25.0000 mg | ORAL_TABLET | Freq: Every day | ORAL | Status: DC
  Administered 2015-05-06: 25 mg via ORAL
  Filled 2015-05-05: qty 1

## 2015-05-05 NOTE — H&P (Signed)
TRH H&P   Patient Demographics:    Megan Pitts, is a 82 y.o. female  MRN: [id number]   DOB - May 10, 1927  Admit Date - 05/05/2015  Outpatient Primary MD for the patient is Marton Redwood, MD  Referring MD/NP/PA: Tanna Furry  Patient coming from: Home  Chief Complaint  Patient presents with  . Wrist Pain      HPI:    Megan Pitts  is a 82 y.o. female, With history of hypertension who presented to the hospital with complaints of pain at her right hand and wrist. The patient stated that she had lab draw at her PCPs office and subsequently a few days later developed current discomfort. He denies any trauma to her wrist. She states that she has redness and discomfort which is currently getting worse. Nothing she is aware of makes it better. Moving her right wrist makes it worse.  Patient was evaluated by hand surgeon given patient's discomfort and erythema was mainly located in the volar aspect of her right hand. Surgery was found as such medical admission was recommended for antibiotic recommendations.    Review of systems:     +Fever-chills, No Headache, No changes with Vision or hearing, No problems swallowing food or Liquids, No Chest pain, Cough or Shortness of Breath, No Abdominal pain, No Nausea or Vommitting, Bowel movements are regular, No Blood in stool or Urine, No dysuria, +new skin rashes or - bruises, + new joints pains, aches,  No new weakness, tingling, numbness in any extremity, No recent weight gain or loss, No polyuria, polydypsia or polyphagia, No significant Mental Stressors.  A full 10 point Review of Systems was done, except as stated above, all other Review of Systems were negative.   With Past History of the following :    Past Medical History  Diagnosis Date  . Aortic stenosis     s/p AVR 07/2005(porcine)  . PVC's (premature ventricular  contractions)   . Hypertension   . Hyperlipidemia   . Phlebitis     UPPER EXTREMITY  . A-fib ([place])   . GERD (gastroesophageal reflux disease)   . Gilbert's syndrome   . Shingles   . IFG (impaired fasting glucose)   . OAB (overactive bladder)   . Osteopenia   . Varicose veins     s/p treatment   . Carotid bruit 09/2008    <50% B  . Vertigo       Past Surgical History  Procedure Laterality Date  . Cardiac catheterization  07/25/2005    EF 60% THE AORTIC VALVE IS HEAVILY CALCIFIED WITH REDUCED OPENING. NO MVP  . Aortic valve replacement  08/01/2005    WITH A #23MM TORONTO STENTLESS PORCINE AORTIC VALVE  . US echocardiography  04/14/2008    EF 55-60%. NORMAL  . US echocardiography  11/21/2005    EF 55-60%  . US echocardiography  07/20/2005  EF 55-60%  . US echocardiography  10/19/2004    EF 55-60%  . Abdominal hysterectomy  1963    partial  . Hemorrhoid surgery  1980's  . Foot surgery    . Hand surgery    . Varicose vein surgery  2010    laser treatment      Social History:     Social History  Substance Use Topics  . Smoking status: Never Smoker   . Smokeless tobacco: Never Used  . Alcohol Use: No     Lives -  At home   Family History :     Family History  Problem Relation Age of Onset  . Pneumonia Mother 31  . Alzheimer's disease Father   . Leukemia Brother   . Alcohol abuse Brother       Home Medications:   Prior to Admission medications   Medication Sig Start Date End Date Taking? Authorizing Provider  acetaminophen (TYLENOL) 500 MG tablet Take 500 mg by mouth every 6 (six) hours as needed for mild pain.   Yes Historical Provider, MD  amLODipine (NORVASC) 5 MG tablet Take 5 mg by mouth daily. 1/2 daily   Yes Historical Provider, MD  aspirin 81 MG tablet Take 81 mg by mouth daily.     Yes Historical Provider, MD  atenolol (TENORMIN) 25 MG tablet TAKE 1 TABLET ONCE DAILY. 10/12/14  Yes Lanelle Bal, PA-C  donepezil (ARICEPT) 10 MG tablet Take  10 mg by mouth at bedtime.  01/11/15  Yes Historical Provider, MD  Multiple Vitamin (MULTIVITAMIN) tablet Take 1 tablet by mouth daily.   Yes Historical Provider, MD  rosuvastatin (CRESTOR) 40 MG tablet Take 20 mg by mouth daily.    Yes Historical Provider, MD  valsartan-hydrochlorothiazide (DIOVAN-HCT) 320-25 MG per tablet Take 1 tablet by mouth daily.   Yes Historical Provider, MD  Vitamin D, Ergocalciferol, (DRISDOL) 50000 units CAPS capsule Take 50,000 Units by mouth every 7 (seven) days.   Yes Historical Provider, MD     Allergies:     Allergies  Allergen Reactions  . Synthroid [Levothyroxine Sodium]     Pt stated not sure what happens when she takes this     Physical Exam:   Vitals  Blood pressure 147/91, pulse 70, temperature 98.3 F (36.8 C), temperature source Oral, resp. rate 18, SpO2 99 %.   1. General Pt awake and alert, lying in bed in NAD,  2. Normal affect and insight, Not Suicidal or Homicidal, Awake Alert  3. No F.N deficits, ALL C.Nerves Intact, answers questions appropriately, moves extremities equally  4. Ears and Eyes appear Normal, Conjunctivae clear, PERRLA. Moist Oral Mucosa.  5. Supple Neck, No JVD, No cervical lymphadenopathy appriciated, No Carotid Bruits.  6. Symmetrical Chest wall movement, Good air movement bilaterally, CTAB.  7. RRR, No Gallops, Rubs   8. Positive Bowel Sounds, Abdomen Soft, No tenderness, No organomegaly appriciated,No rebound -guarding or rigidity.  9.  No Cyanosis, Normal Skin Turgor, there is erythema right hand and wrist. Worse volar aspect of right hand  10. Good muscle tone,  joints appear normal , no effusions, range of motion limited at right hand secondary to discomfort  11. No Palpable Lymph Nodes in Neck or Axillae    Data Review:    CBC  Recent Labs Lab 05/05/15 1632  WBC 15.2*  HGB 14.8  HCT 42.0  PLT 315  MCV 85.7  MCH 30.2  MCHC 35.2  RDW 13.2  LYMPHSABS 2.3  MONOABS  1.7*  EOSABS 0.0    BASOSABS 0.0   ------------------------------------------------------------------------------------------------------------------  Chemistries  No results for input(s): NA, K, CL, CO2, GLUCOSE, BUN, CREATININE, CALCIUM, MG, AST, ALT, ALKPHOS, BILITOT in the last 168 hours.  Invalid input(s): GFRCGP ------------------------------------------------------------------------------------------------------------------ CrCl cannot be calculated (Unknown ideal weight.). ------------------------------------------------------------------------------------------------------------------ No results for input(s): TSH, T4TOTAL, T3FREE, THYROIDAB in the last 72 hours.  Invalid input(s): FREET3  Coagulation profile No results for input(s): INR, PROTIME in the last 168 hours. ------------------------------------------------------------------------------------------------------------------- No results for input(s): DDIMER in the last 72 hours. -------------------------------------------------------------------------------------------------------------------  Cardiac Enzymes No results for input(s): CKMB, TROPONINI, MYOGLOBIN in the last 168 hours.  Invalid input(s): CK ------------------------------------------------------------------------------------------------------------------ No results found for: BNP   ---------------------------------------------------------------------------------------------------------------  Urinalysis No results found for: COLORURINE, APPEARANCEUR, LABSPEC, PHURINE, GLUCOSEU, HGBUR, BILIRUBINUR, KETONESUR, PROTEINUR, UROBILINOGEN, NITRITE, LEUKOCYTESUR  ----------------------------------------------------------------------------------------------------------------   Imaging Results:    Dg Wrist Complete Left  05/05/2015  CLINICAL DATA:  Pain and swelling in the right wrist for 3 days. EXAM: LEFT WRIST - COMPLETE 3+ VIEW COMPARISON:  Wrist MRI 05/25/2011 FINDINGS:  Previous resection of the trapezium with degenerative changes at the base of the first metacarpal bone. Chondrocalcinosis distal to the ulna. Irregularity and small bone fragments near the triquetrum and pisiform bone. There is sclerosis and small areas of lucency involving the pisiform which could represent small erosions. Wrist is located. There is soft tissue swelling in the wrist, particularly along the palmar aspect. Negative for an acute fracture. IMPRESSION: Chronic changes throughout the carpal bones and prior resection of the trapezium. Suspect underlying inflammatory changes along the ulnar aspect of the wrist joint and involving the triquetrum and pisiform. Soft tissue swelling. No acute fracture. Electronically Signed   By: Markus Daft M.D.   On: 05/05/2015 13:52    My personal review of EKG: Normal sinus rhythm with no ST elevations or depressions on last EKG on file which was in 01/15/2015   Assessment & Plan:    Active Problems:   Cellulitis -Blood cultures ordered. Hand surgeon has evaluated and found surgery. Plan will be to continue IV antibiotics. - Will reassess WBC count next a.m. - Observe while on antibiotics.  HTN - Continue home medication regimen beta blocker - Placed on low-salt diet  DVT Prophylaxis Heparin  AM Labs Ordered, also please review Full Orders  Family Communication: d/c patient   Code Status full  Likely DC to  home  Condition GUARDED    Consults called: ED to hand surgeon   Admission status: obs    Time spent in minutes : > 40 minutes   Velvet Bathe M.D on 05/05/2015 at 6:22 PM  Between 7am to 7pm - Pager - [phone]. After 7pm go to [email or web address] - password [email or web address]  [facility] - Office  [phone]

## 2015-05-05 NOTE — ED Provider Notes (Signed)
Pt seen and evaluated.  History of lab draw from dorsum of right hand 5 days ago.  Approximately 36 hours ago, developed redness and pain.  Uncertain if redness dorsally, or volar first.  Now with errythema and sts to ulnar/dorsum, and ulnar volar right wrist.  Volar erythema striates upward to mid volar forearm. Marked pain with rom of right wrist.  No h/o monoarthropathy/gout.  No injury.  Had new lab done today at PCP with WBC 19K.  H/o Ao valve (porcine), on ASA 81mg  daily.   Agree with admit, abx, hand surgical consult for ?arthrocentesis.  Tanna Furry, MD 05/05/15 [phone]

## 2015-05-05 NOTE — ED Provider Notes (Signed)
CSN: [id number]     Arrival date & time 05/05/15  1204 History   First MD Initiated Contact with Patient 05/05/15 1537     Chief Complaint  Patient presents with  . Wrist Pain     (Consider location/radiation/quality/duration/timing/severity/associated sxs/prior Treatment) HPI 82 y.o. RHD female With a history of atrial fibrillation, status post AVR presents to the ED stating that on 4/21 she had normal blood work drawn at her PCP's office from the dorsum of her right hand. She reportedly did well over the weekend and starting yesterday afternoon she noted the progressive onset of worsening redness over the palmar aspect of her right wrist with accompanying swelling and extensive worsening pain with range of motion. He states that her symptoms progressively worsened overnight keeping her up. She denies any history of trauma injury to the area. She notes a remote history of tendon surgery to her right thumb many years prior. No history of gout or monoarthropathy. No history of prior pain in her wrist. She is not taking anything for pain. She presented initially to her PCPs office today for further evaluation of her wrist. There she was found to have significant cellulitis over the volar aspect of her right wrist with erythema streaking up her right forearm. She denied any associated fever, chills, nausea, vomiting, diarrhea. He has had extensive difficulty with gripping things and using her hand normally. Her evaluation and her PCPs office concern was raised for the possibility of underlying abscess versus septic joint and she was transferred to the ED for further evaluation. Labs were drawn at PCPs office and were significant for leukocytosis of 19,000.   Past Medical History  Diagnosis Date  . Aortic stenosis     s/p AVR 07/2005(porcine)  . PVC's (premature ventricular contractions)   . Hypertension   . Hyperlipidemia   . Phlebitis     UPPER EXTREMITY  . A-fib ([place])   . GERD (gastroesophageal  reflux disease)   . Gilbert's syndrome   . Shingles   . IFG (impaired fasting glucose)   . OAB (overactive bladder)   . Osteopenia   . Varicose veins     s/p treatment   . Carotid bruit 09/2008    <50% B  . Vertigo    Past Surgical History  Procedure Laterality Date  . Cardiac catheterization  07/25/2005    EF 60% THE AORTIC VALVE IS HEAVILY CALCIFIED WITH REDUCED OPENING. NO MVP  . Aortic valve replacement  08/01/2005    WITH A #23MM [place] STENTLESS PORCINE AORTIC VALVE  . US echocardiography  04/14/2008    EF 55-60%. NORMAL  . US echocardiography  11/21/2005    EF 55-60%  . US echocardiography  07/20/2005    EF 55-60%  . US echocardiography  10/19/2004    EF 55-60%  . Abdominal hysterectomy  1963    partial  . Hemorrhoid surgery  1980's  . Foot surgery    . Hand surgery    . Varicose vein surgery  2010    laser treatment   Family History  Problem Relation Age of Onset  . Pneumonia Mother 3  . Alzheimer's disease Father   . Leukemia Brother   . Alcohol abuse Brother    Social History  Substance Use Topics  . Smoking status: Never Smoker   . Smokeless tobacco: Never Used  . Alcohol Use: No   OB History    No data available     Review of Systems  Constitutional: Positive for  activity change. Negative for fever, chills and appetite change.  HENT: Negative for congestion, rhinorrhea and sinus pressure.   Respiratory: Negative for cough and shortness of breath.   Cardiovascular: Negative for chest pain.  Gastrointestinal: Negative for nausea, vomiting, abdominal pain and diarrhea.  Musculoskeletal: Positive for joint swelling (right wrist) and arthralgias (right wrist). Negative for myalgias, back pain, gait problem and neck pain.  Skin: Positive for rash. Negative for wound.  Neurological: Positive for weakness (decreased grip strength in right hand). Negative for dizziness, syncope, numbness and headaches.  All other systems reviewed and are  negative.     Allergies  Synthroid  Home Medications   Prior to Admission medications   Medication Sig Start Date End Date Taking? Authorizing Provider  acetaminophen (TYLENOL) 500 MG tablet Take 500 mg by mouth every 6 (six) hours as needed for mild pain.   Yes Historical Provider, MD  amLODipine (NORVASC) 5 MG tablet Take 5 mg by mouth daily. 1/2 daily   Yes Historical Provider, MD  aspirin 81 MG tablet Take 81 mg by mouth daily.     Yes Historical Provider, MD  atenolol (TENORMIN) 25 MG tablet TAKE 1 TABLET ONCE DAILY. 10/12/14  Yes Lanelle Bal, PA-C  donepezil (ARICEPT) 10 MG tablet Take 10 mg by mouth at bedtime.  01/11/15  Yes Historical Provider, MD  Multiple Vitamin (MULTIVITAMIN) tablet Take 1 tablet by mouth daily.   Yes Historical Provider, MD  rosuvastatin (CRESTOR) 40 MG tablet Take 20 mg by mouth daily.    Yes Historical Provider, MD  valsartan-hydrochlorothiazide (DIOVAN-HCT) 320-25 MG per tablet Take 1 tablet by mouth daily.   Yes Historical Provider, MD  Vitamin D, Ergocalciferol, (DRISDOL) 50000 units CAPS capsule Take 50,000 Units by mouth every 7 (seven) days.   Yes Historical Provider, MD   BP 133/39 mmHg  Pulse 58  Temp(Src) 98.3 F (36.8 C) (Oral)  Resp 16  Ht 5\' 2"  (1.575 m)  Wt 76.6 kg  BMI 30.88 kg/m2  SpO2 97% Physical Exam  Constitutional: She is oriented to person, place, and time. She appears well-developed and well-nourished. No distress.  HENT:  Head: Normocephalic and atraumatic.  Nose: Nose normal.  Mouth/Throat: Oropharynx is clear and moist.  Eyes: Conjunctivae and EOM are normal. Pupils are equal, round, and reactive to light.  Neck: Neck supple.  Cardiovascular: Normal rate, regular rhythm, normal heart sounds and intact distal pulses.   Pulmonary/Chest: Effort normal and breath sounds normal.  Abdominal: Soft. She exhibits no distension. There is no tenderness.  Musculoskeletal: She exhibits edema and tenderness.       Right wrist:  She exhibits decreased range of motion, tenderness and swelling. She exhibits no crepitus, no deformity and no laceration.       Arms: Significant decreased ROM with tenderness to palpation in the flexor and extensor muscles of the right forearm. Decreased grip strength due to significant pain.  Neurological: She is alert and oriented to person, place, and time. No cranial nerve deficit or sensory deficit. Coordination normal. GCS eye subscore is 4. GCS verbal subscore is 5. GCS motor subscore is 6.  Skin: Skin is warm and dry. She is not diaphoretic.  Nursing note and vitals reviewed.   ED Course  Procedures (including critical care time) Labs Review Labs Reviewed  CBC WITH DIFFERENTIAL/PLATELET - Abnormal; Notable for the following:    WBC 15.2 (*)    Neutro Abs 11.2 (*)    Monocytes Absolute 1.7 (*)  All other components within normal limits  BASIC METABOLIC PANEL - Abnormal; Notable for the following:    Glucose, Bld 113 (*)    BUN 29 (*)    Creatinine, Ser 1.10 (*)    GFR calc non Af Amer 44 (*)    GFR calc Af Amer 52 (*)    All other components within normal limits  SEDIMENTATION RATE - Abnormal; Notable for the following:    Sed Rate 38 (*)    All other components within normal limits  C-REACTIVE PROTEIN - Abnormal; Notable for the following:    CRP 3.6 (*)    All other components within normal limits  CULTURE, BLOOD (ROUTINE X 2)  CULTURE, BLOOD (ROUTINE X 2)  CBC  BASIC METABOLIC PANEL    Imaging Review Dg Wrist Complete Left  05/05/2015  CLINICAL DATA:  Pain and swelling in the right wrist for 3 days. EXAM: LEFT WRIST - COMPLETE 3+ VIEW COMPARISON:  Wrist MRI 05/25/2011 FINDINGS: Previous resection of the trapezium with degenerative changes at the base of the first metacarpal bone. Chondrocalcinosis distal to the ulna. Irregularity and small bone fragments near the triquetrum and pisiform bone. There is sclerosis and small areas of lucency involving the pisiform  which could represent small erosions. Wrist is located. There is soft tissue swelling in the wrist, particularly along the palmar aspect. Negative for an acute fracture. IMPRESSION: Chronic changes throughout the carpal bones and prior resection of the trapezium. Suspect underlying inflammatory changes along the ulnar aspect of the wrist joint and involving the triquetrum and pisiform. Soft tissue swelling. No acute fracture. Electronically Signed   By: Markus Daft M.D.   On: 05/05/2015 13:52   I have personally reviewed and evaluated these images and lab results as part of my medical decision-making.   EKG Interpretation None      MDM  82 y.o. female presents to the ED for evaluation of right wrist cellulitis, swelling and significant pain with decreased range of motion from her PCPs office. On exam patient is afebrile with vital signs stable sitting in bed in no acute distress. Physical exam of right upper extremity, as above, concerning for cellulitis versus septic wrist joint versus abscess. X-ray shows evidence of inflammatory changes along the ulnar aspect of the wrist joint with corresponding soft tissue swelling but no acute fracture or evidence of obvious osteomyelitis. Her case was discussed with Dr. Bertis Ruddy PA who stated that he will advise Dr. Burney Gauze who will see the patient at the bedside. After seeing the patient at the bedside and he stated that no surgical care was necessary at this time and felt that her symptoms are likely due to cellulitis. Recommended IV antibiotics and admission for further care and assessment. Antibiotics were started blood cultures were drawn. The patient was then admitted to the hospitalist service. This plan was discussed with the patient at the bedside and she stated both understanding and agreement.   Final diagnoses:  Cellulitis of right upper extremity       Zenovia Jarred, DO 05/06/15 0112  Tanna Furry, MD 05/15/15 [phone]

## 2015-05-05 NOTE — Progress Notes (Signed)
Pharmacy Antibiotic Note  Megan Pitts is a 82 y.o. female admitted on 05/05/2015 with cellulitis.  Pharmacy has been consulted for cefazolin and vancomycin dosing. Pt presents with wrist pain after having blood draw a few days ago.  Pt received vancomycin 1g IV once in the ED.  Plan: Vancomycin 1250mg  IV every 24 hours.  Goal trough 10-15 mcg/mL. Cefazolin 1g IV q8h  Monitor culture data, renal function and clinical course VT at SS prn     Temp (24hrs), Avg:98.4 F (36.9 C), Min:98.3 F (36.8 C), Max:98.5 F (36.9 C)   Recent Labs Lab 05/05/15 1632  WBC 15.2*  CREATININE 1.10*    CrCl cannot be calculated (Unknown ideal weight.).    Allergies  Allergen Reactions  . Synthroid [Levothyroxine Sodium]     Pt stated not sure what happens when she takes this    Antimicrobials this admission: Cefaz 4/26 >>  Vanc 4/26 >>   Dose adjustments this admission: n/a  Microbiology results: 4/26 BCx: sent  UCx:    Sputum:    MRSA PCR:    Andrey Cota. Diona Foley, PharmD, [place] Clinical Pharmacist Pager [phone] 05/05/2015 8:36 PM

## 2015-05-05 NOTE — ED Notes (Signed)
Dr. Burney Gauze, MD, hand surgeon, at bedside.

## 2015-05-05 NOTE — Consult Note (Signed)
Reason for Consult:right wrist volar swelling Referring Physician: Peggye Pitts is an 82 y.o. female.  HPI: with no h/o of trauma or penetrating injury except dorsal blood draw a few days ago with acute right wrist volar pain and swelling  Past Medical History  Diagnosis Date  . Aortic stenosis     s/p AVR 07/2005(porcine)  . PVC's (premature ventricular contractions)   . Hypertension   . Hyperlipidemia   . Phlebitis     UPPER EXTREMITY  . A-fib ([place])   . GERD (gastroesophageal reflux disease)   . Gilbert's syndrome   . Shingles   . IFG (impaired fasting glucose)   . OAB (overactive bladder)   . Osteopenia   . Varicose veins     s/p treatment   . Carotid bruit 09/2008    <50% B  . Vertigo     Past Surgical History  Procedure Laterality Date  . Cardiac catheterization  07/25/2005    EF 60% THE AORTIC VALVE IS HEAVILY CALCIFIED WITH REDUCED OPENING. NO MVP  . Aortic valve replacement  08/01/2005    WITH A #23MM [place] STENTLESS PORCINE AORTIC VALVE  . US echocardiography  04/14/2008    EF 55-60%. NORMAL  . US echocardiography  11/21/2005    EF 55-60%  . US echocardiography  07/20/2005    EF 55-60%  . US echocardiography  10/19/2004    EF 55-60%  . Abdominal hysterectomy  1963    partial  . Hemorrhoid surgery  1980's  . Foot surgery    . Hand surgery    . Varicose vein surgery  2010    laser treatment    Family History  Problem Relation Age of Onset  . Pneumonia Mother 68  . Alzheimer's disease Father   . Leukemia Brother   . Alcohol abuse Brother     Social History:  reports that she has never smoked. She has never used smokeless tobacco. She reports that she does not drink alcohol or use illicit drugs.  Allergies:  Allergies  Allergen Reactions  . Synthroid [Levothyroxine Sodium]     Pt stated not sure what happens when she takes this    Medications: Prior to Admission:  (Not in a hospital admission)  Results for orders placed or performed during  the hospital encounter of 05/05/15 (from the past 48 hour(s))  CBC with Differential     Status: Abnormal (Preliminary result)   Collection Time: 05/05/15  4:32 PM  Result Value Ref Range   WBC 15.2 (H) 4.0 - 10.5 K/uL   RBC 4.90 3.87 - 5.11 MIL/uL   Hemoglobin 14.8 12.0 - 15.0 g/dL   HCT 42.0 36.0 - 46.0 %   MCV 85.7 78.0 - 100.0 fL   MCH 30.2 26.0 - 34.0 pg   MCHC 35.2 30.0 - 36.0 g/dL   RDW 13.2 11.5 - 15.5 %   Platelets 315 150 - 400 K/uL   Neutrophils Relative % PENDING %   Neutro Abs PENDING 1.7 - 7.7 K/uL   Band Neutrophils PENDING %   Lymphocytes Relative PENDING %   Lymphs Abs PENDING 0.7 - 4.0 K/uL   Monocytes Relative PENDING %   Monocytes Absolute PENDING 0.1 - 1.0 K/uL   Eosinophils Relative PENDING %   Eosinophils Absolute PENDING 0.0 - 0.7 K/uL   Basophils Relative PENDING %   Basophils Absolute PENDING 0.0 - 0.1 K/uL   WBC Morphology PENDING    RBC Morphology PENDING    Smear  Review PENDING    nRBC PENDING 0 /100 WBC   Metamyelocytes Relative PENDING %   Myelocytes PENDING %   Promyelocytes Absolute PENDING %   Blasts PENDING %    Dg Wrist Complete Left  05/05/2015  CLINICAL DATA:  Pain and swelling in the right wrist for 3 days. EXAM: LEFT WRIST - COMPLETE 3+ VIEW COMPARISON:  Wrist MRI 05/25/2011 FINDINGS: Previous resection of the trapezium with degenerative changes at the base of the first metacarpal bone. Chondrocalcinosis distal to the ulna. Irregularity and small bone fragments near the triquetrum and pisiform bone. There is sclerosis and small areas of lucency involving the pisiform which could represent small erosions. Wrist is located. There is soft tissue swelling in the wrist, particularly along the palmar aspect. Negative for an acute fracture. IMPRESSION: Chronic changes throughout the carpal bones and prior resection of the trapezium. Suspect underlying inflammatory changes along the ulnar aspect of the wrist joint and involving the triquetrum and  pisiform. Soft tissue swelling. No acute fracture. Electronically Signed   By: Markus Daft M.D.   On: 05/05/2015 13:52    Review of Systems  All other systems reviewed and are negative.  Blood pressure 147/55, pulse 60, temperature 98.3 F (36.8 C), temperature source Oral, resp. rate 18, SpO2 100 %. Physical Exam  Constitutional: She is oriented to person, place, and time. She appears well-developed and well-nourished.  HENT:  Head: Normocephalic and atraumatic.  Neck: Normal range of motion.  Cardiovascular: Normal rate.   Respiratory: Effort normal.  Musculoskeletal:       Right wrist: She exhibits tenderness and swelling.  Right wrist volar swelling and mild erythema just proximal to distal wrist crease  Neurological: She is alert and oriented to person, place, and time.  Skin: Skin is warm. There is erythema.  Psychiatric: She has a normal mood and affect. Her behavior is normal. Judgment and thought content normal.    Assessment/Plan: No sign of c/s or abscess at this time  Appears to be cellulitic vs inflammatory (gout)  Would admit for IV abx and elevation  Splint for comfort  Repeat WBC in am and also obtain uric acid   Nsaids if tolerated  Will follow with you  Jerek Meulemans A 05/05/2015, 5:16 PM

## 2015-05-05 NOTE — ED Notes (Signed)
Dr. Ronnald Ramp, DO at bedside.

## 2015-05-05 NOTE — ED Notes (Signed)
Pt here for left wrist pain and swelling. Streaking radiating into FA. Sent here by her PCP for IV abx.

## 2015-05-06 DIAGNOSIS — I10 Essential (primary) hypertension: Secondary | ICD-10-CM | POA: Diagnosis not present

## 2015-05-06 DIAGNOSIS — E785 Hyperlipidemia, unspecified: Secondary | ICD-10-CM | POA: Diagnosis not present

## 2015-05-06 DIAGNOSIS — Z953 Presence of xenogenic heart valve: Secondary | ICD-10-CM | POA: Diagnosis not present

## 2015-05-06 DIAGNOSIS — L03113 Cellulitis of right upper limb: Secondary | ICD-10-CM | POA: Diagnosis not present

## 2015-05-06 DIAGNOSIS — Z7982 Long term (current) use of aspirin: Secondary | ICD-10-CM | POA: Diagnosis not present

## 2015-05-06 DIAGNOSIS — R001 Bradycardia, unspecified: Secondary | ICD-10-CM | POA: Diagnosis not present

## 2015-05-06 LAB — CBC
HEMATOCRIT: 39.5 % (ref 36.0–46.0)
Hemoglobin: 13.3 g/dL (ref 12.0–15.0)
MCH: 29.4 pg (ref 26.0–34.0)
MCHC: 33.7 g/dL (ref 30.0–36.0)
MCV: 87.2 fL (ref 78.0–100.0)
Platelets: 283 10*3/uL (ref 150–400)
RBC: 4.53 MIL/uL (ref 3.87–5.11)
RDW: 13.4 % (ref 11.5–15.5)
WBC: 9.9 10*3/uL (ref 4.0–10.5)

## 2015-05-06 LAB — BASIC METABOLIC PANEL
Anion gap: 12 (ref 5–15)
BUN: 23 mg/dL — ABNORMAL HIGH (ref 6–20)
CALCIUM: 9.1 mg/dL (ref 8.9–10.3)
CO2: 22 mmol/L (ref 22–32)
CREATININE: 1.07 mg/dL — AB (ref 0.44–1.00)
Chloride: 102 mmol/L (ref 101–111)
GFR calc Af Amer: 53 mL/min — ABNORMAL LOW (ref >60)
GFR calc non Af Amer: 46 mL/min — ABNORMAL LOW (ref >60)
GLUCOSE: 99 mg/dL (ref 65–99)
Potassium: 3.4 mmol/L — ABNORMAL LOW (ref 3.5–5.1)
Sodium: 136 mmol/L (ref 135–145)

## 2015-05-06 MED ORDER — CEPHALEXIN 500 MG PO CAPS: 500.0000 mg | ORAL_CAPSULE | Freq: Two times a day (BID) | ORAL | Status: DC

## 2015-05-06 MED ORDER — DOXYCYCLINE HYCLATE 100 MG PO CAPS: 100.0000 mg | ORAL_CAPSULE | Freq: Two times a day (BID) | ORAL | Status: DC

## 2015-05-06 NOTE — Progress Notes (Signed)
Orthopedic Tech Progress Note Patient Details:  Megan Pitts 1927/07/03 [id number]  Ortho Devices Type of Ortho Device: Velcro wrist splint Ortho Device/Splint Interventions: Application   [name] 05/06/2015, 2:15 PM

## 2015-05-06 NOTE — Discharge Summary (Signed)
Physician Discharge Summary  Megan Pitts [id number] DOB: April 03, 1927 DOA: 05/05/2015  PCP: Marton Redwood, MD  Admit date: 05/05/2015 Discharge date: 05/06/2015  Time spent: > 35 minutes  Recommendations for Outpatient Follow-up:  Monitor potassium levels Ensure patient follows up with orthopedic surgeon  Discharge Diagnoses:  Active Problems:   Cellulitis   Discharge Condition: stable  Diet recommendation: Heart healthy  Filed Weights   05/05/15 2200  Weight: 76.6 kg (168 lb 14 oz)    History of present illness:  82 year old with history of hypertension who presented with right wrist erythema/cellulitis which improved on IV antibiotics.  Hospital Course:  Cellulitis - On the right volar hands and wrists. On IV antibiotics. Plan is for patient to follow-up with orthopedic hand surgeon. Will discharge on oral antibiotics  Essential hypertension -Due to bradycardia we'll hold beta blocker on discharge  Procedures:  None  Consultations:  Orthopaedic surgeon  Discharge Exam: Filed Vitals:   05/06/15 0626 05/06/15 0855  BP: 138/47 126/46  Pulse: 58 60  Temp: 98.2 F (36.8 C)   Resp: 16     General: Pt in nad, alert and awake Cardiovascular: rrr, no rubs Respiratory: no increased wob, no wheezes Musculoskeletal: pain with movement of right wrist (less than yesterday). Decreased errythema over palm of right hand  Discharge Instructions   Discharge Instructions    Call MD for:  redness, tenderness, or signs of infection (pain, swelling, redness, odor or green/yellow discharge around incision site)    Complete by:  As directed      Call MD for:  temperature >100.4    Complete by:  As directed      Diet - low sodium heart healthy    Complete by:  As directed      Discharge instructions    Complete by:  As directed   Follow up with the orthopaedic surgeon within the next 1 week.     Increase activity slowly    Complete by:  As directed            Current Discharge Medication List    START taking these medications   Details  cephALEXin (KEFLEX) 500 MG capsule Take 1 capsule (500 mg total) by mouth 2 (two) times daily. Qty: 12 capsule, Refills: 0    doxycycline (VIBRAMYCIN) 100 MG capsule Take 1 capsule (100 mg total) by mouth 2 (two) times daily. Qty: 12 capsule, Refills: 0      CONTINUE these medications which have NOT CHANGED   Details  acetaminophen (TYLENOL) 500 MG tablet Take 500 mg by mouth every 6 (six) hours as needed for mild pain.    aspirin 81 MG tablet Take 81 mg by mouth daily.      donepezil (ARICEPT) 10 MG tablet Take 10 mg by mouth at bedtime.  Refills: 10    Multiple Vitamin (MULTIVITAMIN) tablet Take 1 tablet by mouth daily.    rosuvastatin (CRESTOR) 40 MG tablet Take 20 mg by mouth daily.     valsartan-hydrochlorothiazide (DIOVAN-HCT) 320-25 MG per tablet Take 1 tablet by mouth daily.    Vitamin D, Ergocalciferol, (DRISDOL) 50000 units CAPS capsule Take 50,000 Units by mouth every 7 (seven) days.      STOP taking these medications     amLODipine (NORVASC) 5 MG tablet      atenolol (TENORMIN) 25 MG tablet        Allergies  Allergen Reactions  . Synthroid [Levothyroxine Sodium]     Pt stated not sure what happens  when she takes this      The results of significant diagnostics from this hospitalization (including imaging, microbiology, ancillary and laboratory) are listed below for reference.    Significant Diagnostic Studies: Dg Wrist Complete Left  2015-05-29  CLINICAL DATA:  Pain and swelling in the right wrist for 3 days. EXAM: LEFT WRIST - COMPLETE 3+ VIEW COMPARISON:  Wrist MRI 05/25/2011 FINDINGS: Previous resection of the trapezium with degenerative changes at the base of the first metacarpal bone. Chondrocalcinosis distal to the ulna. Irregularity and small bone fragments near the triquetrum and pisiform bone. There is sclerosis and small areas of lucency involving the pisiform  which could represent small erosions. Wrist is located. There is soft tissue swelling in the wrist, particularly along the palmar aspect. Negative for an acute fracture. IMPRESSION: Chronic changes throughout the carpal bones and prior resection of the trapezium. Suspect underlying inflammatory changes along the ulnar aspect of the wrist joint and involving the triquetrum and pisiform. Soft tissue swelling. No acute fracture. Electronically Signed   By: Markus Daft M.D.   On: 05/29/2015 13:52    Microbiology: Recent Results (from the past 240 hour(s))  Culture, blood (Routine X 2) w Reflex to ID Panel     Status: None (Preliminary result)   Collection Time: 29-May-2015  6:01 PM  Result Value Ref Range Status   Specimen Description BLOOD RIGHT ARM  Final   Special Requests BOTTLES DRAWN AEROBIC AND ANAEROBIC 5CC  Final   Culture NO GROWTH < 24 HOURS  Final   Report Status PENDING  Incomplete  Culture, blood (Routine X 2) w Reflex to ID Panel     Status: None (Preliminary result)   Collection Time: 2015/05/29  8:35 PM  Result Value Ref Range Status   Specimen Description BLOOD RIGHT WRIST  Final   Special Requests BOTTLES DRAWN AEROBIC AND ANAEROBIC 5CC   Final   Culture NO GROWTH < 24 HOURS  Final   Report Status PENDING  Incomplete     Labs: Basic Metabolic Panel:  Recent Labs Lab May 29, 2015 1632 05/06/15 0630  NA 136 136  K 3.7 3.4*  CL 101 102  CO2 24 22  GLUCOSE 113* 99  BUN 29* 23*  CREATININE 1.10* 1.07*  CALCIUM 10.1 9.1   Liver Function Tests: No results for input(s): AST, ALT, ALKPHOS, BILITOT, PROT, ALBUMIN in the last 168 hours. No results for input(s): LIPASE, AMYLASE in the last 168 hours. No results for input(s): AMMONIA in the last 168 hours. CBC:  Recent Labs Lab May 29, 2015 1632 05/06/15 0630  WBC 15.2* 9.9  NEUTROABS 11.2*  --   HGB 14.8 13.3  HCT 42.0 39.5  MCV 85.7 87.2  PLT 315 283   Cardiac Enzymes: No results for input(s): CKTOTAL, CKMB, CKMBINDEX,  TROPONINI in the last 168 hours. BNP: BNP (last 3 results) No results for input(s): BNP in the last 8760 hours.  ProBNP (last 3 results) No results for input(s): PROBNP in the last 8760 hours.  CBG: No results for input(s): GLUCAP in the last 168 hours.   Signed:  Velvet Bathe MD.  [facility] 05/06/2015, 1:57 PM

## 2015-05-06 NOTE — Progress Notes (Signed)
Patient asking for RX to be sent to [place]

## 2015-05-06 NOTE — Progress Notes (Signed)
Patient much improved clinically with normal WBC   Would d/c on po abx and wrist splint with follow up in my office next week

## 2015-05-06 NOTE — Care Management Important Message (Deleted)
Important Message  Patient Details  Name: Megan Pitts MRN: [id number] Date of Birth: 08-14-1927   Medicare Important Message Given:  Yes    Sharin Mons, RN 05/06/2015, 11:50 AM

## 2015-05-06 NOTE — Care Management Obs Status (Signed)
MEDICARE OBSERVATION STATUS NOTIFICATION   Patient Details  Name: Megan Pitts MRN: [id number] Date of Birth: 1927-04-28   Medicare Observation Status Notification Given:  Yes    Sharin Mons, RN 05/06/2015, 11:51 AM

## 2015-05-06 NOTE — Care Management Obs Status (Signed)
MEDICARE OBSERVATION STATUS NOTIFICATION   Patient Details  Name: Megan Pitts MRN: [id number] Date of Birth: 05-24-1927   Medicare Observation Status Notification Given:  Yes  Done by Whitman Hero RN CM  Carles Collet, RN 05/06/2015, 2:52 PM

## 2015-05-06 NOTE — Progress Notes (Signed)
Megan Pitts to be D/C'd Home per MD order.  Discussed with the patient and all questions fully answered.  VSS  IV catheter discontinued intact. Site without signs and symptoms of complications. Dressing and pressure applied.  An After Visit Summary was printed and given to the patient. Patient received prescription.  D/c education completed with patient/family including follow up instructions, medication list, d/c activities limitations if indicated, with other d/c instructions as indicated by MD - patient able to verbalize understanding, all questions fully answered.   Patient instructed to return to ED, call 911, or call MD for any changes in condition.   Patient to be escorted via [place], and D/C home via private auto.  L'ESPERANCE, [name] C 05/06/2015 2:17 PM

## 2015-05-07 DIAGNOSIS — R7301 Impaired fasting glucose: Secondary | ICD-10-CM | POA: Diagnosis not present

## 2015-05-07 DIAGNOSIS — E784 Other hyperlipidemia: Secondary | ICD-10-CM | POA: Diagnosis not present

## 2015-05-07 DIAGNOSIS — L03113 Cellulitis of right upper limb: Secondary | ICD-10-CM | POA: Diagnosis not present

## 2015-05-07 DIAGNOSIS — Z1389 Encounter for screening for other disorder: Secondary | ICD-10-CM | POA: Diagnosis not present

## 2015-05-07 DIAGNOSIS — Z952 Presence of prosthetic heart valve: Secondary | ICD-10-CM | POA: Diagnosis not present

## 2015-05-07 DIAGNOSIS — Z6829 Body mass index (BMI) 29.0-29.9, adult: Secondary | ICD-10-CM | POA: Diagnosis not present

## 2015-05-07 DIAGNOSIS — E038 Other specified hypothyroidism: Secondary | ICD-10-CM | POA: Diagnosis not present

## 2015-05-07 DIAGNOSIS — G3184 Mild cognitive impairment, so stated: Secondary | ICD-10-CM | POA: Diagnosis not present

## 2015-05-07 DIAGNOSIS — Z0000 Encounter for general adult medical examination without abnormal findings: Secondary | ICD-10-CM | POA: Diagnosis not present

## 2015-05-07 DIAGNOSIS — M859 Disorder of bone density and structure, unspecified: Secondary | ICD-10-CM | POA: Diagnosis not present

## 2015-05-07 DIAGNOSIS — G6289 Other specified polyneuropathies: Secondary | ICD-10-CM | POA: Diagnosis not present

## 2015-05-07 DIAGNOSIS — I10 Essential (primary) hypertension: Secondary | ICD-10-CM | POA: Diagnosis not present

## 2015-05-10 LAB — CULTURE, BLOOD (ROUTINE X 2)
CULTURE: NO GROWTH
Culture: NO GROWTH

## 2015-05-11 DIAGNOSIS — L03113 Cellulitis of right upper limb: Secondary | ICD-10-CM | POA: Diagnosis not present

## 2015-05-14 ENCOUNTER — Other Ambulatory Visit: Payer: Self-pay

## 2015-05-14 DIAGNOSIS — Z1231 Encounter for screening mammogram for malignant neoplasm of breast: Secondary | ICD-10-CM

## 2015-05-20 ENCOUNTER — Ambulatory Visit
Admission: RE | Admit: 2015-05-20 | Discharge: 2015-05-20 | Disposition: A | Discharge status: Home / Self Care | Payer: Medicare Other | Source: Ambulatory Visit

## 2015-05-20 DIAGNOSIS — Z1231 Encounter for screening mammogram for malignant neoplasm of breast: Secondary | ICD-10-CM

## 2015-05-25 DIAGNOSIS — G8929 Other chronic pain: Secondary | ICD-10-CM | POA: Diagnosis not present

## 2015-05-25 DIAGNOSIS — M25531 Pain in right wrist: Secondary | ICD-10-CM | POA: Diagnosis not present

## 2015-05-25 DIAGNOSIS — L03113 Cellulitis of right upper limb: Secondary | ICD-10-CM | POA: Diagnosis not present

## 2015-06-24 DIAGNOSIS — M25531 Pain in right wrist: Secondary | ICD-10-CM | POA: Diagnosis not present

## 2015-07-15 DIAGNOSIS — M859 Disorder of bone density and structure, unspecified: Secondary | ICD-10-CM | POA: Diagnosis not present

## 2015-08-03 DIAGNOSIS — H40013 Open angle with borderline findings, low risk, bilateral: Secondary | ICD-10-CM | POA: Diagnosis not present

## 2015-08-03 DIAGNOSIS — H40053 Ocular hypertension, bilateral: Secondary | ICD-10-CM | POA: Diagnosis not present

## 2015-08-12 DIAGNOSIS — H6123 Impacted cerumen, bilateral: Secondary | ICD-10-CM | POA: Diagnosis not present

## 2015-10-12 DIAGNOSIS — Z23 Encounter for immunization: Secondary | ICD-10-CM | POA: Diagnosis not present

## 2015-12-31 DIAGNOSIS — L821 Other seborrheic keratosis: Secondary | ICD-10-CM | POA: Diagnosis not present

## 2015-12-31 DIAGNOSIS — D225 Melanocytic nevi of trunk: Secondary | ICD-10-CM | POA: Diagnosis not present

## 2015-12-31 DIAGNOSIS — D2271 Melanocytic nevi of right lower limb, including hip: Secondary | ICD-10-CM | POA: Diagnosis not present

## 2015-12-31 DIAGNOSIS — D2272 Melanocytic nevi of left lower limb, including hip: Secondary | ICD-10-CM | POA: Diagnosis not present

## 2015-12-31 DIAGNOSIS — D692 Other nonthrombocytopenic purpura: Secondary | ICD-10-CM | POA: Diagnosis not present

## 2016-01-13 NOTE — Progress Notes (Signed)
Megan Pitts Date of Birth: [birth year] Medical Record [id number]  History of Present Illness: [name] is seen back today for a follow up AVR. She has a history of severe AS and had AVR with a #70mm [place] stentless porcine valve in July of 2007. Echo in 12/14 showed normal valve function. Other issues include HTN, PVCs, HLD, post op atrial fib, GERD, Gilbert's syndrome, and carotid disease. She was admitted to the hospital in April 2017 with cellulitis treated with antibiotics. She was noted to be bradycardic and beta blocker was discontinued.   On follow up today she is doing very well. She denies any chest pain or shortness of breath.  She still works at [employer] part time. Still has some memory loss. Her medication list still has atenolol on it but she doesn't know if she is taking it.     Current Outpatient Prescriptions  Medication Sig Dispense Refill  . acetaminophen (TYLENOL) 500 MG tablet Take 500 mg by mouth every 6 (six) hours as needed for mild pain.    Marland Kitchen aspirin 81 MG tablet Take 81 mg by mouth daily.      Marland Kitchen donepezil (ARICEPT) 10 MG tablet Take 10 mg by mouth at bedtime.   10  . Multiple Vitamin (MULTIVITAMIN) tablet Take 1 tablet by mouth daily.    . rosuvastatin (CRESTOR) 40 MG tablet Take 20 mg by mouth daily.     . valsartan-hydrochlorothiazide (DIOVAN-HCT) 320-25 MG per tablet Take 1 tablet by mouth daily.    . Vitamin D, Ergocalciferol, (DRISDOL) 50000 units CAPS capsule Take 50,000 Units by mouth every 7 (seven) days.     No current facility-administered medications for this visit.     Allergies  Allergen Reactions  . Synthroid [Levothyroxine Sodium]     Pt stated not sure what happens when she takes this    Past Medical History:  Diagnosis Date  . A-fib ([place])   . Aortic stenosis    s/p AVR 07/2005(porcine)  . Carotid bruit 09/2008   <50% B  . GERD (gastroesophageal reflux disease)   . Gilbert's syndrome   . Hyperlipidemia   . Hypertension   . IFG  (impaired fasting glucose)   . OAB (overactive bladder)   . Osteopenia   . Phlebitis    UPPER EXTREMITY  . PVC's (premature ventricular contractions)   . Shingles   . Varicose veins    s/p treatment   . Vertigo     Past Surgical History:  Procedure Laterality Date  . ABDOMINAL HYSTERECTOMY  1963   partial  . AORTIC VALVE REPLACEMENT  08/01/2005   WITH A #23MM [place] STENTLESS PORCINE AORTIC VALVE  . CARDIAC CATHETERIZATION  07/25/2005   EF 60% THE AORTIC VALVE IS HEAVILY CALCIFIED WITH REDUCED OPENING. NO MVP  . FOOT SURGERY    . HAND SURGERY    . HEMORRHOID SURGERY  1980's  . US ECHOCARDIOGRAPHY  04/14/2008   EF 55-60%. NORMAL  . US ECHOCARDIOGRAPHY  11/21/2005   EF 55-60%  . US ECHOCARDIOGRAPHY  07/20/2005   EF 55-60%  . US ECHOCARDIOGRAPHY  10/19/2004   EF 55-60%  . VARICOSE VEIN SURGERY  2010   laser treatment    History  Smoking Status  . Never Smoker  Smokeless Tobacco  . Never Used    History  Alcohol Use No    Family History  Problem Relation Age of Onset  . Pneumonia Mother 48  . Alzheimer's disease Father   . Leukemia Brother   .  Alcohol abuse Brother     Review of Systems: The review of systems is per the HPI.  All other systems were reviewed and are negative.  Physical Exam: BP (!) 130/58 (BP Location: Left Arm, Patient Position: Sitting, Cuff Size: Normal)   Pulse (!) 51   Ht 5\' 2"  (1.575 m)   Wt 154 lb (69.9 kg)   BMI 28.17 kg/m  Patient is very pleasant and in no acute distress. Looks  younger than her stated age. Skin is warm and dry. Color is normal.  HEENT is unremarkable. Normocephalic/atraumatic. PERRL. Sclera are nonicteric. Neck is supple. No masses. No JVD. Lungs are clear. Cardiac exam shows a regular rate and rhythm. 2/6 systolic outflow murmur noted. Abdomen is soft. Extremities are without edema. Gait and ROM are intact. No gross neurologic deficits noted.  LABORATORY DATA: Lab Results  Component Value Date   WBC 9.9  05/06/2015   HGB 13.3 05/06/2015   HCT 39.5 05/06/2015   PLT 283 05/06/2015   GLUCOSE 99 05/06/2015   NA 136 05/06/2015   K 3.4 (L) 05/06/2015   CL 102 05/06/2015   CREATININE 1.07 (H) 05/06/2015   BUN 23 (H) 05/06/2015   CO2 22 05/06/2015    Ecg today. NSR with rate 51 bpm. LAE. Otherwise normal. I have personally reviewed and interpreted this study.  Assessment / Plan: 1. S/P AVR with tissue prosthesis in 2007- will update Echo at this time.  2. HTN - controlled. Continue current therapy. She should not be taking atenolol and I have asked that she check her bottles and take this off her list.   3. Hyperlipidemia-on Crestor. Labs followed by Dr. Brigitte Pulse.  Overall she is doing well. See her back in one year.

## 2016-01-14 DIAGNOSIS — H40053 Ocular hypertension, bilateral: Secondary | ICD-10-CM | POA: Diagnosis not present

## 2016-01-14 DIAGNOSIS — H5340 Unspecified visual field defects: Secondary | ICD-10-CM | POA: Diagnosis not present

## 2016-01-14 DIAGNOSIS — H40013 Open angle with borderline findings, low risk, bilateral: Secondary | ICD-10-CM | POA: Diagnosis not present

## 2016-01-18 ENCOUNTER — Encounter: Payer: Self-pay | Admitting: Cardiology

## 2016-01-18 ENCOUNTER — Ambulatory Visit (INDEPENDENT_AMBULATORY_CARE_PROVIDER_SITE_OTHER): Payer: Medicare Other | Admitting: Cardiology

## 2016-01-18 VITALS — BP 130/58 | HR 51 | Ht 62.0 in | Wt 154.0 lb

## 2016-01-18 DIAGNOSIS — I10 Essential (primary) hypertension: Secondary | ICD-10-CM

## 2016-01-18 DIAGNOSIS — Z952 Presence of prosthetic heart valve: Secondary | ICD-10-CM

## 2016-01-18 DIAGNOSIS — E7800 Pure hypercholesterolemia, unspecified: Secondary | ICD-10-CM | POA: Diagnosis not present

## 2016-01-18 NOTE — Patient Instructions (Signed)
Make sure you are not taking atenolol  Continue your other therapy  We will schedule you for an Echocardiogram  I will see you in one year.

## 2016-02-03 ENCOUNTER — Ambulatory Visit (HOSPITAL_COMMUNITY): Payer: Medicare Other | Attending: Internal Medicine

## 2016-02-03 DIAGNOSIS — I10 Essential (primary) hypertension: Secondary | ICD-10-CM

## 2016-02-03 DIAGNOSIS — Z952 Presence of prosthetic heart valve: Secondary | ICD-10-CM | POA: Insufficient documentation

## 2016-02-03 DIAGNOSIS — I119 Hypertensive heart disease without heart failure: Secondary | ICD-10-CM | POA: Diagnosis not present

## 2016-02-03 DIAGNOSIS — I4891 Unspecified atrial fibrillation: Secondary | ICD-10-CM | POA: Diagnosis not present

## 2016-02-03 DIAGNOSIS — E7800 Pure hypercholesterolemia, unspecified: Secondary | ICD-10-CM | POA: Diagnosis not present

## 2016-02-03 DIAGNOSIS — I493 Ventricular premature depolarization: Secondary | ICD-10-CM | POA: Insufficient documentation

## 2016-04-27 ENCOUNTER — Encounter: Payer: Self-pay | Admitting: Podiatry

## 2016-04-27 ENCOUNTER — Ambulatory Visit (INDEPENDENT_AMBULATORY_CARE_PROVIDER_SITE_OTHER): Payer: Medicare Other | Admitting: Podiatry

## 2016-04-27 VITALS — BP 177/86 | HR 66 | Resp 16

## 2016-04-27 DIAGNOSIS — M779 Enthesopathy, unspecified: Secondary | ICD-10-CM | POA: Diagnosis not present

## 2016-04-27 DIAGNOSIS — L84 Corns and callosities: Secondary | ICD-10-CM | POA: Diagnosis not present

## 2016-04-27 DIAGNOSIS — M79673 Pain in unspecified foot: Secondary | ICD-10-CM

## 2016-04-27 MED ORDER — TRIAMCINOLONE ACETONIDE 10 MG/ML IJ SUSP
10.0000 mg | Freq: Once | INTRAMUSCULAR | Status: AC
  Administered 2016-04-27: 10 mg

## 2016-04-27 NOTE — Progress Notes (Signed)
Subjective:     Patient ID: Megan Pitts, female   DOB: [birth year], 83 y.o.   MRN: [id number]  HPI patient states she has a lot of discomfort underneath her big toe joint of her left foot with fluid buildup and lesion formation and states that she has tried to trim it herself without relief   Review of Systems     Objective:   Physical Exam  Neurovascular status intact muscle strength adequate with patient found to have inflammatory changes of the first metatarsal head left medial side with fluid buildup under the plantar capsule and lesion formation    Assessment:     Inflammatory capsulitis plantar first MPJ left with keratotic lesion formation    Plan:     H&P condition reviewed and careful injection administered 3 mg Dexon some Kenalog and deep debridement of lesion with no iatrogenic bleeding noted

## 2016-05-04 DIAGNOSIS — I10 Essential (primary) hypertension: Secondary | ICD-10-CM | POA: Diagnosis not present

## 2016-05-04 DIAGNOSIS — E784 Other hyperlipidemia: Secondary | ICD-10-CM | POA: Diagnosis not present

## 2016-05-04 DIAGNOSIS — E038 Other specified hypothyroidism: Secondary | ICD-10-CM | POA: Diagnosis not present

## 2016-05-11 DIAGNOSIS — G6289 Other specified polyneuropathies: Secondary | ICD-10-CM | POA: Diagnosis not present

## 2016-05-11 DIAGNOSIS — E038 Other specified hypothyroidism: Secondary | ICD-10-CM | POA: Diagnosis not present

## 2016-05-11 DIAGNOSIS — Z952 Presence of prosthetic heart valve: Secondary | ICD-10-CM | POA: Diagnosis not present

## 2016-05-11 DIAGNOSIS — R7301 Impaired fasting glucose: Secondary | ICD-10-CM | POA: Diagnosis not present

## 2016-05-11 DIAGNOSIS — Z0000 Encounter for general adult medical examination without abnormal findings: Secondary | ICD-10-CM | POA: Diagnosis not present

## 2016-05-11 DIAGNOSIS — E784 Other hyperlipidemia: Secondary | ICD-10-CM | POA: Diagnosis not present

## 2016-05-11 DIAGNOSIS — I10 Essential (primary) hypertension: Secondary | ICD-10-CM | POA: Diagnosis not present

## 2016-05-11 DIAGNOSIS — Z1389 Encounter for screening for other disorder: Secondary | ICD-10-CM | POA: Diagnosis not present

## 2016-05-11 DIAGNOSIS — G3184 Mild cognitive impairment, so stated: Secondary | ICD-10-CM | POA: Diagnosis not present

## 2016-05-11 DIAGNOSIS — Z6826 Body mass index (BMI) 26.0-26.9, adult: Secondary | ICD-10-CM | POA: Diagnosis not present

## 2016-05-11 DIAGNOSIS — N3281 Overactive bladder: Secondary | ICD-10-CM | POA: Diagnosis not present

## 2016-05-11 DIAGNOSIS — N3946 Mixed incontinence: Secondary | ICD-10-CM | POA: Diagnosis not present

## 2016-06-08 DIAGNOSIS — Z6827 Body mass index (BMI) 27.0-27.9, adult: Secondary | ICD-10-CM | POA: Diagnosis not present

## 2016-06-08 DIAGNOSIS — G588 Other specified mononeuropathies: Secondary | ICD-10-CM | POA: Diagnosis not present

## 2016-07-14 DIAGNOSIS — R351 Nocturia: Secondary | ICD-10-CM | POA: Diagnosis not present

## 2016-07-14 DIAGNOSIS — N3941 Urge incontinence: Secondary | ICD-10-CM | POA: Diagnosis not present

## 2016-07-14 DIAGNOSIS — R350 Frequency of micturition: Secondary | ICD-10-CM | POA: Diagnosis not present

## 2016-07-18 DIAGNOSIS — H40053 Ocular hypertension, bilateral: Secondary | ICD-10-CM | POA: Diagnosis not present

## 2016-07-18 DIAGNOSIS — H40013 Open angle with borderline findings, low risk, bilateral: Secondary | ICD-10-CM | POA: Diagnosis not present

## 2016-10-10 DIAGNOSIS — Z23 Encounter for immunization: Secondary | ICD-10-CM | POA: Diagnosis not present

## 2016-10-26 DIAGNOSIS — R351 Nocturia: Secondary | ICD-10-CM | POA: Diagnosis not present

## 2016-10-26 DIAGNOSIS — N3941 Urge incontinence: Secondary | ICD-10-CM | POA: Diagnosis not present

## 2016-12-07 DIAGNOSIS — R350 Frequency of micturition: Secondary | ICD-10-CM | POA: Diagnosis not present

## 2016-12-07 DIAGNOSIS — N3941 Urge incontinence: Secondary | ICD-10-CM | POA: Diagnosis not present

## 2016-12-14 DIAGNOSIS — R350 Frequency of micturition: Secondary | ICD-10-CM | POA: Diagnosis not present

## 2016-12-22 DIAGNOSIS — R350 Frequency of micturition: Secondary | ICD-10-CM | POA: Diagnosis not present

## 2016-12-28 DIAGNOSIS — N3941 Urge incontinence: Secondary | ICD-10-CM | POA: Diagnosis not present

## 2017-01-04 DIAGNOSIS — R350 Frequency of micturition: Secondary | ICD-10-CM | POA: Diagnosis not present

## 2017-01-04 DIAGNOSIS — N3941 Urge incontinence: Secondary | ICD-10-CM | POA: Diagnosis not present

## 2017-01-11 DIAGNOSIS — N3941 Urge incontinence: Secondary | ICD-10-CM | POA: Diagnosis not present

## 2017-01-11 DIAGNOSIS — R350 Frequency of micturition: Secondary | ICD-10-CM | POA: Diagnosis not present

## 2017-01-18 DIAGNOSIS — R350 Frequency of micturition: Secondary | ICD-10-CM | POA: Diagnosis not present

## 2017-01-18 DIAGNOSIS — N3941 Urge incontinence: Secondary | ICD-10-CM | POA: Diagnosis not present

## 2017-01-23 DIAGNOSIS — H02831 Dermatochalasis of right upper eyelid: Secondary | ICD-10-CM | POA: Diagnosis not present

## 2017-01-23 DIAGNOSIS — H11153 Pinguecula, bilateral: Secondary | ICD-10-CM | POA: Diagnosis not present

## 2017-01-23 DIAGNOSIS — H40013 Open angle with borderline findings, low risk, bilateral: Secondary | ICD-10-CM | POA: Diagnosis not present

## 2017-01-23 DIAGNOSIS — H5340 Unspecified visual field defects: Secondary | ICD-10-CM | POA: Diagnosis not present

## 2017-01-25 DIAGNOSIS — R350 Frequency of micturition: Secondary | ICD-10-CM | POA: Diagnosis not present

## 2017-02-01 DIAGNOSIS — N3941 Urge incontinence: Secondary | ICD-10-CM | POA: Diagnosis not present

## 2017-02-01 DIAGNOSIS — R350 Frequency of micturition: Secondary | ICD-10-CM | POA: Diagnosis not present

## 2017-02-08 DIAGNOSIS — R350 Frequency of micturition: Secondary | ICD-10-CM | POA: Diagnosis not present

## 2017-02-08 DIAGNOSIS — N3941 Urge incontinence: Secondary | ICD-10-CM | POA: Diagnosis not present

## 2017-02-15 DIAGNOSIS — N3941 Urge incontinence: Secondary | ICD-10-CM | POA: Diagnosis not present

## 2017-02-15 DIAGNOSIS — R350 Frequency of micturition: Secondary | ICD-10-CM | POA: Diagnosis not present

## 2017-02-22 DIAGNOSIS — N3941 Urge incontinence: Secondary | ICD-10-CM | POA: Diagnosis not present

## 2017-02-27 DIAGNOSIS — D2271 Melanocytic nevi of right lower limb, including hip: Secondary | ICD-10-CM | POA: Diagnosis not present

## 2017-02-27 DIAGNOSIS — D692 Other nonthrombocytopenic purpura: Secondary | ICD-10-CM | POA: Diagnosis not present

## 2017-02-27 DIAGNOSIS — L570 Actinic keratosis: Secondary | ICD-10-CM | POA: Diagnosis not present

## 2017-02-27 DIAGNOSIS — L820 Inflamed seborrheic keratosis: Secondary | ICD-10-CM | POA: Diagnosis not present

## 2017-02-27 DIAGNOSIS — L723 Sebaceous cyst: Secondary | ICD-10-CM | POA: Diagnosis not present

## 2017-02-27 DIAGNOSIS — L821 Other seborrheic keratosis: Secondary | ICD-10-CM | POA: Diagnosis not present

## 2017-02-27 DIAGNOSIS — D2272 Melanocytic nevi of left lower limb, including hip: Secondary | ICD-10-CM | POA: Diagnosis not present

## 2017-02-27 DIAGNOSIS — D225 Melanocytic nevi of trunk: Secondary | ICD-10-CM | POA: Diagnosis not present

## 2017-02-27 DIAGNOSIS — L918 Other hypertrophic disorders of the skin: Secondary | ICD-10-CM | POA: Diagnosis not present

## 2017-03-15 DIAGNOSIS — N3941 Urge incontinence: Secondary | ICD-10-CM | POA: Diagnosis not present

## 2017-03-15 DIAGNOSIS — R350 Frequency of micturition: Secondary | ICD-10-CM | POA: Diagnosis not present

## 2017-03-15 NOTE — Progress Notes (Signed)
Megan Pitts Date of Birth: [birth year] Medical Record #[id number]  History of Present Illness: Ms. Girardot is seen back today for a follow up Aortic valve disease. She has a history of severe AS and had AVR with a #6mm [place] stentless porcine valve in July of 2007. Echo in 1/18 showed good valve function with only trivial AI. Other issues include HTN, PVCs, HLD, post op atrial fib, GERD, Gilbert's syndrome, and carotid disease. She has a history of bradycardia when taking beta blockers.   On follow up today she is doing very well. She has no chest pain, dyspnea, dizziness, palpitations, or edema.   She still works at [employer] part time. Still has some memory loss.    Current Outpatient Medications  Medication Sig Dispense Refill  . acetaminophen (TYLENOL) 500 MG tablet Take 500 mg by mouth every 6 (six) hours as needed for mild pain.    Marland Kitchen amLODipine (NORVASC) 10 MG tablet   4  . aspirin 81 MG tablet Take 81 mg by mouth daily.      Marland Kitchen donepezil (ARICEPT) 10 MG tablet Take 10 mg by mouth at bedtime.   10  . Multiple Vitamin (MULTIVITAMIN) tablet Take 1 tablet by mouth daily.    Marland Kitchen oxybutynin (DITROPAN-XL) 10 MG 24 hr tablet Take 10 mg by mouth at bedtime.   2  . rosuvastatin (CRESTOR) 40 MG tablet Take 20 mg by mouth daily.     . valsartan-hydrochlorothiazide (DIOVAN-HCT) 320-25 MG per tablet Take 1 tablet by mouth daily.    . Vitamin D, Ergocalciferol, (DRISDOL) 50000 units CAPS capsule Take 50,000 Units by mouth every 7 (seven) days.     No current facility-administered medications for this visit.     Allergies  Allergen Reactions  . Synthroid [Levothyroxine Sodium]     Pt stated not sure what happens when she takes this    Past Medical History:  Diagnosis Date  . A-fib ([place])   . Aortic stenosis    s/p AVR 07/2005(porcine)  . Carotid bruit 09/2008   <50% B  . GERD (gastroesophageal reflux disease)   . Gilbert's syndrome   . Hyperlipidemia   . Hypertension   . IFG (impaired  fasting glucose)   . OAB (overactive bladder)   . Osteopenia   . Phlebitis    UPPER EXTREMITY  . PVC's (premature ventricular contractions)   . Shingles   . Varicose veins    s/p treatment   . Vertigo     Past Surgical History:  Procedure Laterality Date  . ABDOMINAL HYSTERECTOMY  1963   partial  . AORTIC VALVE REPLACEMENT  08/01/2005   WITH A #23MM [place] STENTLESS PORCINE AORTIC VALVE  . CARDIAC CATHETERIZATION  07/25/2005   EF 60% THE AORTIC VALVE IS HEAVILY CALCIFIED WITH REDUCED OPENING. NO MVP  . FOOT SURGERY    . HAND SURGERY    . HEMORRHOID SURGERY  1980's  . US ECHOCARDIOGRAPHY  04/14/2008   EF 55-60%. NORMAL  . US ECHOCARDIOGRAPHY  11/21/2005   EF 55-60%  . US ECHOCARDIOGRAPHY  07/20/2005   EF 55-60%  . US ECHOCARDIOGRAPHY  10/19/2004   EF 55-60%  . VARICOSE VEIN SURGERY  2010   laser treatment    Social History   Tobacco Use  Smoking Status Never Smoker  Smokeless Tobacco Never Used    Social History   Substance and Sexual Activity  Alcohol Use No    Family History  Problem Relation Age of Onset  . Pneumonia  Mother 23  . Alzheimer's disease Father   . Leukemia Brother   . Alcohol abuse Brother     Review of Systems: The review of systems is per the HPI.  All other systems were reviewed and are negative.  Physical Exam: BP 122/62   Pulse 66   Ht 5' 2.5" (1.588 m)   Wt 154 lb 9.6 oz (70.1 kg)   BMI 27.83 kg/m  GENERAL:  Well appearing WF appears younger than stated age.  HEENT:  PERRL, EOMI, sclera are clear. Oropharynx is clear. NECK:  No jugular venous distention, carotid upstroke brisk and symmetric, no bruits, no thyromegaly or adenopathy LUNGS:  Clear to auscultation bilaterally CHEST:  Unremarkable HEART:  RRR,  PMI not displaced or sustained,S1 and S2 within normal limits, no S3, no S4: no clicks, no rubs, There is a gr 2/6 systolic murmur RUSB radiating to carotids. There is also blowing gr 2/6 diastolic murmur RUSB radiating to LSB.   ABD:  Soft, nontender. BS +, no masses or bruits. No hepatomegaly, no splenomegaly EXT:  2 + pulses throughout, no edema, no cyanosis no clubbing SKIN:  Warm and dry.  No rashes NEURO:  Alert and oriented x 3. Cranial nerves II through XII intact. PSYCH:  Cognitively intact    LABORATORY DATA: Lab Results  Component Value Date   WBC 9.9 05/06/2015   HGB 13.3 05/06/2015   HCT 39.5 05/06/2015   PLT 283 05/06/2015   GLUCOSE 99 05/06/2015   NA 136 05/06/2015   K 3.4 (L) 05/06/2015   CL 102 05/06/2015   CREATININE 1.07 (H) 05/06/2015   BUN 23 (H) 05/06/2015   CO2 22 05/06/2015   Labs dated 05/11/16: cholesterol 181, triglycerides 84, HDL 68, LDL 96. Dated 05/04/16: normal chemistries, Hgb, and TSH.  Ecg today. NSR with rate 66 bpm. Occ. PAC. Otherwise normal. I have personally reviewed and interpreted this study.   Echo 02/03/16: Study Conclusions  - Left ventricle: The cavity size was normal. Wall thickness was   increased in a pattern of mild LVH. Doppler parameters are   consistent with abnormal left ventricular relaxation (grade 1   diastolic dysfunction). - Aortic valve: AV opens well Peak and mean gradients through the   valve are 23 and 13 mm Hg respectively. There was trivial   regurgitation. - Left atrium: The atrium was mildly dilated.  Assessment / Plan: 1. S/P AVR with tissue prosthesis in 2007- Stable Echo in 2018. On Exam she has a new diastolic murmur c/w aortic insufficiency. Will repeat Echo study to evaluate.   2. HTN - controlled. Continue current therapy. Avoid beta blocker.   3. Hyperlipidemia-on Crestor. Labs followed by Dr. Brigitte Pulse.

## 2017-03-16 ENCOUNTER — Ambulatory Visit (INDEPENDENT_AMBULATORY_CARE_PROVIDER_SITE_OTHER): Payer: Medicare Other | Admitting: Cardiology

## 2017-03-16 ENCOUNTER — Encounter: Payer: Self-pay | Admitting: Cardiology

## 2017-03-16 VITALS — BP 122/62 | HR 66 | Ht 62.5 in | Wt 154.6 lb

## 2017-03-16 DIAGNOSIS — E7800 Pure hypercholesterolemia, unspecified: Secondary | ICD-10-CM | POA: Diagnosis not present

## 2017-03-16 DIAGNOSIS — Z952 Presence of prosthetic heart valve: Secondary | ICD-10-CM | POA: Diagnosis not present

## 2017-03-16 DIAGNOSIS — I10 Essential (primary) hypertension: Secondary | ICD-10-CM

## 2017-03-16 NOTE — Patient Instructions (Signed)
We will schedule you for an Echocardiogram  Continue your current therapy

## 2017-03-27 ENCOUNTER — Other Ambulatory Visit: Payer: Self-pay

## 2017-03-27 ENCOUNTER — Ambulatory Visit (HOSPITAL_COMMUNITY): Payer: Medicare Other | Attending: Cardiology

## 2017-03-27 DIAGNOSIS — E7800 Pure hypercholesterolemia, unspecified: Secondary | ICD-10-CM | POA: Diagnosis not present

## 2017-03-27 DIAGNOSIS — I10 Essential (primary) hypertension: Secondary | ICD-10-CM | POA: Diagnosis not present

## 2017-03-27 DIAGNOSIS — I082 Rheumatic disorders of both aortic and tricuspid valves: Secondary | ICD-10-CM | POA: Insufficient documentation

## 2017-03-27 DIAGNOSIS — I420 Dilated cardiomyopathy: Secondary | ICD-10-CM | POA: Insufficient documentation

## 2017-03-27 DIAGNOSIS — Z952 Presence of prosthetic heart valve: Secondary | ICD-10-CM

## 2017-03-27 DIAGNOSIS — I350 Nonrheumatic aortic (valve) stenosis: Secondary | ICD-10-CM

## 2017-03-27 DIAGNOSIS — I5030 Unspecified diastolic (congestive) heart failure: Secondary | ICD-10-CM | POA: Insufficient documentation

## 2017-04-05 DIAGNOSIS — N3941 Urge incontinence: Secondary | ICD-10-CM | POA: Diagnosis not present

## 2017-05-01 DIAGNOSIS — R350 Frequency of micturition: Secondary | ICD-10-CM | POA: Diagnosis not present

## 2017-05-01 DIAGNOSIS — N3941 Urge incontinence: Secondary | ICD-10-CM | POA: Diagnosis not present

## 2017-05-17 DIAGNOSIS — M859 Disorder of bone density and structure, unspecified: Secondary | ICD-10-CM | POA: Diagnosis not present

## 2017-05-17 DIAGNOSIS — E7849 Other hyperlipidemia: Secondary | ICD-10-CM | POA: Diagnosis not present

## 2017-05-17 DIAGNOSIS — R82998 Other abnormal findings in urine: Secondary | ICD-10-CM | POA: Diagnosis not present

## 2017-05-17 DIAGNOSIS — R7301 Impaired fasting glucose: Secondary | ICD-10-CM | POA: Diagnosis not present

## 2017-05-17 DIAGNOSIS — I10 Essential (primary) hypertension: Secondary | ICD-10-CM | POA: Diagnosis not present

## 2017-05-17 DIAGNOSIS — E038 Other specified hypothyroidism: Secondary | ICD-10-CM | POA: Diagnosis not present

## 2017-05-24 DIAGNOSIS — R350 Frequency of micturition: Secondary | ICD-10-CM | POA: Diagnosis not present

## 2017-05-24 DIAGNOSIS — M859 Disorder of bone density and structure, unspecified: Secondary | ICD-10-CM | POA: Diagnosis not present

## 2017-05-24 DIAGNOSIS — G3184 Mild cognitive impairment, so stated: Secondary | ICD-10-CM | POA: Diagnosis not present

## 2017-05-24 DIAGNOSIS — I10 Essential (primary) hypertension: Secondary | ICD-10-CM | POA: Diagnosis not present

## 2017-05-24 DIAGNOSIS — Z0000 Encounter for general adult medical examination without abnormal findings: Secondary | ICD-10-CM | POA: Diagnosis not present

## 2017-05-24 DIAGNOSIS — Z6827 Body mass index (BMI) 27.0-27.9, adult: Secondary | ICD-10-CM | POA: Diagnosis not present

## 2017-05-24 DIAGNOSIS — E038 Other specified hypothyroidism: Secondary | ICD-10-CM | POA: Diagnosis not present

## 2017-05-24 DIAGNOSIS — N3281 Overactive bladder: Secondary | ICD-10-CM | POA: Diagnosis not present

## 2017-05-24 DIAGNOSIS — Z1389 Encounter for screening for other disorder: Secondary | ICD-10-CM | POA: Diagnosis not present

## 2017-05-24 DIAGNOSIS — N3941 Urge incontinence: Secondary | ICD-10-CM | POA: Diagnosis not present

## 2017-05-24 DIAGNOSIS — E7849 Other hyperlipidemia: Secondary | ICD-10-CM | POA: Diagnosis not present

## 2017-05-24 DIAGNOSIS — Z952 Presence of prosthetic heart valve: Secondary | ICD-10-CM | POA: Diagnosis not present

## 2017-05-24 DIAGNOSIS — N3946 Mixed incontinence: Secondary | ICD-10-CM | POA: Diagnosis not present

## 2017-05-24 DIAGNOSIS — R7301 Impaired fasting glucose: Secondary | ICD-10-CM | POA: Diagnosis not present

## 2017-06-12 DIAGNOSIS — Z1212 Encounter for screening for malignant neoplasm of rectum: Secondary | ICD-10-CM | POA: Diagnosis not present

## 2017-06-14 DIAGNOSIS — N3941 Urge incontinence: Secondary | ICD-10-CM | POA: Diagnosis not present

## 2017-06-14 DIAGNOSIS — R350 Frequency of micturition: Secondary | ICD-10-CM | POA: Diagnosis not present

## 2017-07-11 DIAGNOSIS — N3941 Urge incontinence: Secondary | ICD-10-CM | POA: Diagnosis not present

## 2017-07-11 DIAGNOSIS — R350 Frequency of micturition: Secondary | ICD-10-CM | POA: Diagnosis not present

## 2017-07-23 DIAGNOSIS — L723 Sebaceous cyst: Secondary | ICD-10-CM | POA: Diagnosis not present

## 2017-07-23 DIAGNOSIS — L570 Actinic keratosis: Secondary | ICD-10-CM | POA: Diagnosis not present

## 2017-07-23 DIAGNOSIS — D485 Neoplasm of uncertain behavior of skin: Secondary | ICD-10-CM | POA: Diagnosis not present

## 2017-07-23 DIAGNOSIS — L821 Other seborrheic keratosis: Secondary | ICD-10-CM | POA: Diagnosis not present

## 2017-07-23 DIAGNOSIS — L905 Scar conditions and fibrosis of skin: Secondary | ICD-10-CM | POA: Diagnosis not present

## 2017-07-23 DIAGNOSIS — D692 Other nonthrombocytopenic purpura: Secondary | ICD-10-CM | POA: Diagnosis not present

## 2017-08-02 DIAGNOSIS — R350 Frequency of micturition: Secondary | ICD-10-CM | POA: Diagnosis not present

## 2017-08-02 DIAGNOSIS — N3941 Urge incontinence: Secondary | ICD-10-CM | POA: Diagnosis not present

## 2017-09-24 ENCOUNTER — Emergency Department (HOSPITAL_COMMUNITY): Payer: Medicare Other

## 2017-09-24 ENCOUNTER — Encounter (HOSPITAL_COMMUNITY): Payer: Self-pay

## 2017-09-24 ENCOUNTER — Observation Stay (HOSPITAL_COMMUNITY): Payer: Medicare Other

## 2017-09-24 ENCOUNTER — Inpatient Hospital Stay (HOSPITAL_COMMUNITY)
Admission: EM | Admit: 2017-09-24 | Discharge: 2017-09-29 | DRG: 563 | Disposition: A | Discharge status: Skilled Nursing Facility | Payer: Medicare Other | Attending: Family Medicine | Admitting: Family Medicine

## 2017-09-24 ENCOUNTER — Other Ambulatory Visit: Payer: Self-pay

## 2017-09-24 DIAGNOSIS — N289 Disorder of kidney and ureter, unspecified: Secondary | ICD-10-CM | POA: Diagnosis present

## 2017-09-24 DIAGNOSIS — I4891 Unspecified atrial fibrillation: Secondary | ICD-10-CM | POA: Diagnosis not present

## 2017-09-24 DIAGNOSIS — M25511 Pain in right shoulder: Secondary | ICD-10-CM | POA: Diagnosis not present

## 2017-09-24 DIAGNOSIS — S199XXA Unspecified injury of neck, initial encounter: Secondary | ICD-10-CM | POA: Diagnosis not present

## 2017-09-24 DIAGNOSIS — E785 Hyperlipidemia, unspecified: Secondary | ICD-10-CM | POA: Diagnosis not present

## 2017-09-24 DIAGNOSIS — E860 Dehydration: Secondary | ICD-10-CM | POA: Diagnosis present

## 2017-09-24 DIAGNOSIS — S42251A Displaced fracture of greater tuberosity of right humerus, initial encounter for closed fracture: Principal | ICD-10-CM | POA: Diagnosis present

## 2017-09-24 DIAGNOSIS — Z7982 Long term (current) use of aspirin: Secondary | ICD-10-CM | POA: Diagnosis not present

## 2017-09-24 DIAGNOSIS — Z953 Presence of xenogenic heart valve: Secondary | ICD-10-CM

## 2017-09-24 DIAGNOSIS — S42201A Unspecified fracture of upper end of right humerus, initial encounter for closed fracture: Secondary | ICD-10-CM | POA: Diagnosis not present

## 2017-09-24 DIAGNOSIS — S43084A Other dislocation of right shoulder joint, initial encounter: Secondary | ICD-10-CM | POA: Diagnosis not present

## 2017-09-24 DIAGNOSIS — S42214A Unspecified nondisplaced fracture of surgical neck of right humerus, initial encounter for closed fracture: Secondary | ICD-10-CM | POA: Diagnosis not present

## 2017-09-24 DIAGNOSIS — K219 Gastro-esophageal reflux disease without esophagitis: Secondary | ICD-10-CM | POA: Diagnosis present

## 2017-09-24 DIAGNOSIS — S43004A Unspecified dislocation of right shoulder joint, initial encounter: Secondary | ICD-10-CM | POA: Diagnosis not present

## 2017-09-24 DIAGNOSIS — D72829 Elevated white blood cell count, unspecified: Secondary | ICD-10-CM | POA: Diagnosis present

## 2017-09-24 DIAGNOSIS — Z90710 Acquired absence of both cervix and uterus: Secondary | ICD-10-CM

## 2017-09-24 DIAGNOSIS — I5032 Chronic diastolic (congestive) heart failure: Secondary | ICD-10-CM | POA: Diagnosis not present

## 2017-09-24 DIAGNOSIS — R55 Syncope and collapse: Secondary | ICD-10-CM | POA: Diagnosis not present

## 2017-09-24 DIAGNOSIS — Z8673 Personal history of transient ischemic attack (TIA), and cerebral infarction without residual deficits: Secondary | ICD-10-CM | POA: Diagnosis not present

## 2017-09-24 DIAGNOSIS — I959 Hypotension, unspecified: Secondary | ICD-10-CM | POA: Diagnosis not present

## 2017-09-24 DIAGNOSIS — R202 Paresthesia of skin: Secondary | ICD-10-CM | POA: Diagnosis not present

## 2017-09-24 DIAGNOSIS — N3281 Overactive bladder: Secondary | ICD-10-CM | POA: Diagnosis present

## 2017-09-24 DIAGNOSIS — I10 Essential (primary) hypertension: Secondary | ICD-10-CM | POA: Diagnosis not present

## 2017-09-24 DIAGNOSIS — R5381 Other malaise: Secondary | ICD-10-CM | POA: Diagnosis present

## 2017-09-24 DIAGNOSIS — S40011A Contusion of right shoulder, initial encounter: Secondary | ICD-10-CM | POA: Diagnosis not present

## 2017-09-24 DIAGNOSIS — S43014A Anterior dislocation of right humerus, initial encounter: Secondary | ICD-10-CM | POA: Diagnosis not present

## 2017-09-24 DIAGNOSIS — I110 Hypertensive heart disease with heart failure: Secondary | ICD-10-CM | POA: Diagnosis present

## 2017-09-24 DIAGNOSIS — S4291XA Fracture of right shoulder girdle, part unspecified, initial encounter for closed fracture: Secondary | ICD-10-CM | POA: Diagnosis present

## 2017-09-24 DIAGNOSIS — Z79899 Other long term (current) drug therapy: Secondary | ICD-10-CM

## 2017-09-24 DIAGNOSIS — R52 Pain, unspecified: Secondary | ICD-10-CM | POA: Diagnosis not present

## 2017-09-24 DIAGNOSIS — Z888 Allergy status to other drugs, medicaments and biological substances status: Secondary | ICD-10-CM | POA: Diagnosis not present

## 2017-09-24 DIAGNOSIS — Z8672 Personal history of thrombophlebitis: Secondary | ICD-10-CM

## 2017-09-24 DIAGNOSIS — M79603 Pain in arm, unspecified: Secondary | ICD-10-CM | POA: Diagnosis not present

## 2017-09-24 DIAGNOSIS — I5033 Acute on chronic diastolic (congestive) heart failure: Secondary | ICD-10-CM | POA: Diagnosis present

## 2017-09-24 DIAGNOSIS — S0990XA Unspecified injury of head, initial encounter: Secondary | ICD-10-CM | POA: Diagnosis not present

## 2017-09-24 DIAGNOSIS — Z8619 Personal history of other infectious and parasitic diseases: Secondary | ICD-10-CM

## 2017-09-24 DIAGNOSIS — F0390 Unspecified dementia without behavioral disturbance: Secondary | ICD-10-CM | POA: Diagnosis not present

## 2017-09-24 DIAGNOSIS — Z952 Presence of prosthetic heart valve: Secondary | ICD-10-CM

## 2017-09-24 DIAGNOSIS — W19XXXA Unspecified fall, initial encounter: Secondary | ICD-10-CM | POA: Diagnosis not present

## 2017-09-24 LAB — CBC WITH DIFFERENTIAL/PLATELET
BASOS PCT: 0 %
Basophils Absolute: 0 10*3/uL (ref 0.0–0.1)
EOS PCT: 0 %
Eosinophils Absolute: 0 10*3/uL (ref 0.0–0.7)
HCT: 40.4 % (ref 36.0–46.0)
Hemoglobin: 13.9 g/dL (ref 12.0–15.0)
LYMPHS PCT: 5 %
Lymphs Abs: 1.1 10*3/uL (ref 0.7–4.0)
MCH: 30.3 pg (ref 26.0–34.0)
MCHC: 34.4 g/dL (ref 30.0–36.0)
MCV: 88.2 fL (ref 78.0–100.0)
MONO ABS: 0.7 10*3/uL (ref 0.1–1.0)
MONOS PCT: 3 %
Neutro Abs: 18.8 10*3/uL — ABNORMAL HIGH (ref 1.7–7.7)
Neutrophils Relative %: 92 %
Platelets: 403 10*3/uL — ABNORMAL HIGH (ref 150–400)
RBC: 4.58 MIL/uL (ref 3.87–5.11)
RDW: 12.9 % (ref 11.5–15.5)
WBC: 20.6 10*3/uL — ABNORMAL HIGH (ref 4.0–10.5)

## 2017-09-24 LAB — COMPREHENSIVE METABOLIC PANEL
ALBUMIN: 4.6 g/dL (ref 3.5–5.0)
ALK PHOS: 54 U/L (ref 38–126)
ALT: 18 U/L (ref 0–44)
AST: 27 U/L (ref 15–41)
Anion gap: 12 (ref 5–15)
BILIRUBIN TOTAL: 1.5 mg/dL — AB (ref 0.3–1.2)
BUN: 20 mg/dL (ref 8–23)
CALCIUM: 10 mg/dL (ref 8.9–10.3)
CO2: 25 mmol/L (ref 22–32)
Chloride: 103 mmol/L (ref 98–111)
Creatinine, Ser: 0.89 mg/dL (ref 0.44–1.00)
GFR calc Af Amer: >60 mL/min (ref >60)
GFR calc non Af Amer: 57 mL/min — ABNORMAL LOW (ref >60)
GLUCOSE: 128 mg/dL — AB (ref 70–99)
Potassium: 3.6 mmol/L (ref 3.5–5.1)
Sodium: 140 mmol/L (ref 135–145)
TOTAL PROTEIN: 7.9 g/dL (ref 6.5–8.1)

## 2017-09-24 LAB — PROTIME-INR
INR: 0.98
Prothrombin Time: 12.9 seconds (ref 11.4–15.2)

## 2017-09-24 LAB — I-STAT TROPONIN, ED: Troponin i, poc: 0.01 ng/mL (ref 0.00–0.08)

## 2017-09-24 MED ORDER — MORPHINE SULFATE (PF) 4 MG/ML IV SOLN
4.0000 mg | Freq: Once | INTRAVENOUS | Status: AC
  Administered 2017-09-24: 4 mg via INTRAVENOUS
  Filled 2017-09-24: qty 1

## 2017-09-24 MED ORDER — PROPOFOL 10 MG/ML IV BOLUS
INTRAVENOUS | Status: AC | PRN
  Administered 2017-09-24: 35.05 mg via INTRAVENOUS

## 2017-09-24 MED ORDER — MORPHINE SULFATE (PF) 2 MG/ML IV SOLN
2.0000 mg | Freq: Once | INTRAVENOUS | Status: AC
  Administered 2017-09-24: 2 mg via INTRAVENOUS
  Filled 2017-09-24: qty 1

## 2017-09-24 MED ORDER — ENOXAPARIN SODIUM 30 MG/0.3ML ~~LOC~~ SOLN
30.0000 mg | SUBCUTANEOUS | Status: DC
  Administered 2017-09-24 – 2017-09-28 (×5): 30 mg via SUBCUTANEOUS
  Filled 2017-09-24 (×5): qty 0.3

## 2017-09-24 MED ORDER — HYDROCHLOROTHIAZIDE 25 MG PO TABS
25.0000 mg | ORAL_TABLET | Freq: Every day | ORAL | Status: DC
  Administered 2017-09-25: 25 mg via ORAL
  Filled 2017-09-24 (×2): qty 1

## 2017-09-24 MED ORDER — ONDANSETRON HCL 4 MG/2ML IJ SOLN
INTRAMUSCULAR | Status: AC
  Filled 2017-09-24: qty 2

## 2017-09-24 MED ORDER — PROPOFOL 10 MG/ML IV BOLUS
0.5000 mg/kg | Freq: Once | INTRAVENOUS | Status: DC
  Filled 2017-09-24: qty 20

## 2017-09-24 MED ORDER — DONEPEZIL HCL 5 MG PO TABS
10.0000 mg | ORAL_TABLET | Freq: Every day | ORAL | Status: DC
  Administered 2017-09-25 – 2017-09-28 (×4): 10 mg via ORAL
  Filled 2017-09-24 (×5): qty 2

## 2017-09-24 MED ORDER — PROPOFOL 10 MG/ML IV BOLUS: 0.5000 mg/kg | Freq: Once | INTRAVENOUS | Status: DC

## 2017-09-24 MED ORDER — PROPOFOL 10 MG/ML IV BOLUS
INTRAVENOUS | Status: AC | PRN
  Administered 2017-09-24: 17.525 mg via INTRAVENOUS

## 2017-09-24 MED ORDER — SODIUM CHLORIDE 0.9 % IV SOLN
INTRAVENOUS | Status: AC | PRN
  Administered 2017-09-24: 1000 mL via INTRAVENOUS

## 2017-09-24 MED ORDER — OXYBUTYNIN CHLORIDE ER 10 MG PO TB24
10.0000 mg | ORAL_TABLET | Freq: Every day | ORAL | Status: DC
  Administered 2017-09-25 – 2017-09-28 (×4): 10 mg via ORAL
  Filled 2017-09-24 (×5): qty 1

## 2017-09-24 MED ORDER — IRBESARTAN 300 MG PO TABS
300.0000 mg | ORAL_TABLET | Freq: Every day | ORAL | Status: DC
  Administered 2017-09-25 – 2017-09-29 (×5): 300 mg via ORAL
  Filled 2017-09-24 (×5): qty 1

## 2017-09-24 MED ORDER — MORPHINE SULFATE (PF) 2 MG/ML IV SOLN
2.0000 mg | INTRAVENOUS | Status: DC | PRN
  Administered 2017-09-26 (×2): 2 mg via INTRAVENOUS
  Filled 2017-09-24 (×2): qty 1

## 2017-09-24 MED ORDER — ASPIRIN EC 81 MG PO TBEC
81.0000 mg | DELAYED_RELEASE_TABLET | Freq: Every day | ORAL | Status: DC
  Administered 2017-09-25 – 2017-09-29 (×5): 81 mg via ORAL
  Filled 2017-09-24 (×5): qty 1

## 2017-09-24 MED ORDER — ROSUVASTATIN CALCIUM 20 MG PO TABS
20.0000 mg | ORAL_TABLET | Freq: Every day | ORAL | Status: DC
  Administered 2017-09-25 – 2017-09-29 (×4): 20 mg via ORAL
  Filled 2017-09-24 (×2): qty 1
  Filled 2017-09-24 (×2): qty 2
  Filled 2017-09-24: qty 1
  Filled 2017-09-24 (×2): qty 2
  Filled 2017-09-24 (×2): qty 1
  Filled 2017-09-24: qty 2
  Filled 2017-09-24: qty 1

## 2017-09-24 MED ORDER — PROPOFOL 10 MG/ML IV BOLUS
INTRAVENOUS | Status: AC | PRN
  Administered 2017-09-24 (×2): 17.525 mg via INTRAVENOUS

## 2017-09-24 MED ORDER — AMLODIPINE BESYLATE 10 MG PO TABS
10.0000 mg | ORAL_TABLET | Freq: Every day | ORAL | Status: DC
  Administered 2017-09-25 – 2017-09-29 (×5): 10 mg via ORAL
  Filled 2017-09-24 (×5): qty 1
  Filled 2017-09-24: qty 2

## 2017-09-24 MED ORDER — ONDANSETRON HCL 4 MG/2ML IJ SOLN: 4.0000 mg | Freq: Once | INTRAMUSCULAR | Status: AC

## 2017-09-24 NOTE — ED Notes (Signed)
Attempted to call report to [facility], 5N. Placed on hold. Will attempt to call again

## 2017-09-24 NOTE — ED Provider Notes (Signed)
[place] DEPT Provider Note   CSN: [id number] Arrival date & time: 09/24/17  1100     History   Chief Complaint Chief Complaint  Patient presents with  . Extremity Pain    HPI Megan Pitts is a 84 y.o. female.  84 year old female with prior medical history as document below presents for evaluation of right shoulder pain.  Patient does not report any specific injury.  She complains of right shoulder pain since this morning.  Patient does not provide significant details as to how her pain started or when it started.  Patient apparently was acting her normal self yesterday and was able to ambulate around with family.  She was able to lift objects without difficulty.  Family noted significant discomfort to the right shoulder this morning and they brought her to the ED for evaluation.  The history is provided by the patient and medical records.  Extremity Pain  This is a new problem. The current episode started 6 to 12 hours ago. The problem occurs constantly. The problem has not changed since onset.Pertinent negatives include no chest pain, no abdominal pain, no headaches and no shortness of breath. Nothing aggravates the symptoms. Nothing relieves the symptoms. She has tried nothing for the symptoms.    Past Medical History:  Diagnosis Date  . A-fib ([place])   . Aortic stenosis    s/p AVR 07/2005(porcine)  . Carotid bruit 09/2008   <50% B  . GERD (gastroesophageal reflux disease)   . Gilbert's syndrome   . Hyperlipidemia   . Hypertension   . IFG (impaired fasting glucose)   . OAB (overactive bladder)   . Osteopenia   . Phlebitis    UPPER EXTREMITY  . PVC's (premature ventricular contractions)   . Shingles   . Varicose veins    s/p treatment   . Vertigo     Patient Active Problem List   Diagnosis Date Noted  . Cellulitis 05/05/2015  . S/P AVR 10/26/2010  . Aortic stenosis   . Hypertension   . PVC's (premature ventricular contractions)    . Hyperlipidemia     Past Surgical History:  Procedure Laterality Date  . ABDOMINAL HYSTERECTOMY  1963   partial  . AORTIC VALVE REPLACEMENT  08/01/2005   WITH A #23MM [place] STENTLESS PORCINE AORTIC VALVE  . CARDIAC CATHETERIZATION  07/25/2005   EF 60% THE AORTIC VALVE IS HEAVILY CALCIFIED WITH REDUCED OPENING. NO MVP  . FOOT SURGERY    . HAND SURGERY    . HEMORRHOID SURGERY  1980's  . US ECHOCARDIOGRAPHY  04/14/2008   EF 55-60%. NORMAL  . US ECHOCARDIOGRAPHY  11/21/2005   EF 55-60%  . US ECHOCARDIOGRAPHY  07/20/2005   EF 55-60%  . US ECHOCARDIOGRAPHY  10/19/2004   EF 55-60%  . VARICOSE VEIN SURGERY  2010   laser treatment     OB History   None      Home Medications    Prior to Admission medications   Medication Sig Start Date End Date Taking? Authorizing Provider  acetaminophen (TYLENOL) 500 MG tablet Take 500 mg by mouth every 6 (six) hours as needed for mild pain.   Yes [provider]  amLODipine (NORVASC) 10 MG tablet Take 10 mg by mouth daily.  04/20/16  Yes [provider]  aspirin 81 MG tablet Take 81 mg by mouth daily.     Yes [provider]  donepezil (ARICEPT) 10 MG tablet Take 10 mg by mouth at bedtime.  01/11/15  Yes [provider]  hydrochlorothiazide (HYDRODIURIL) 25 MG tablet Take 25 mg by mouth daily.   Yes [provider]  Multiple Vitamin (MULTIVITAMIN) tablet Take 1 tablet by mouth daily.   Yes [provider]  olmesartan (BENICAR) 40 MG tablet Take 40 mg by mouth daily.   Yes [provider]  oxybutynin (DITROPAN-XL) 10 MG 24 hr tablet Take 10 mg by mouth at bedtime.  02/07/17  Yes [provider]  rosuvastatin (CRESTOR) 40 MG tablet Take 20 mg by mouth daily.    Yes [provider]  Vitamin D, Ergocalciferol, (DRISDOL) 50000 units CAPS capsule Take 50,000 Units by mouth every 7 (seven) days. On Sundays   Yes [provider]    Family History Family History    Problem Relation Age of Onset  . Pneumonia Mother 34  . Alzheimer's disease Father   . Leukemia Brother   . Alcohol abuse Brother     Social History Social History   Tobacco Use  . Smoking status: Never Smoker  . Smokeless tobacco: Never Used  Substance Use Topics  . Alcohol use: No  . Drug use: No     Allergies   Synthroid [levothyroxine sodium]   Review of Systems Review of Systems  Respiratory: Negative for shortness of breath.   Cardiovascular: Negative for chest pain.  Gastrointestinal: Negative for abdominal pain.  Neurological: Negative for headaches.  All other systems reviewed and are negative.    Physical Exam Updated Vital Signs BP (!) 148/51   Pulse 80   Temp 98.7 F (37.1 C) (Oral)   Resp 18   Wt 70.1 kg   SpO2 98%   BMI 27.82 kg/m   Physical Exam  Constitutional: She is oriented to person, place, and time. She appears well-developed and well-nourished. No distress.  HENT:  Head: Normocephalic and atraumatic.  Mouth/Throat: Oropharynx is clear and moist.  Eyes: Pupils are equal, round, and reactive to light. Conjunctivae and EOM are normal.  Neck: Normal range of motion. Neck supple.  Cardiovascular: Normal rate, regular rhythm and normal heart sounds.  Pulmonary/Chest: Effort normal and breath sounds normal. No respiratory distress.  Abdominal: Soft. She exhibits no distension. There is no tenderness.  Musculoskeletal: Normal range of motion. She exhibits no edema or deformity.  Significant anterior ecchymosis to the right shoulder.  Right shoulder appears to have a likely dislocation.  The right upper extremity is NVI.   Neurological: She is alert and oriented to person, place, and time.  Skin: Skin is warm and dry.  Psychiatric: She has a normal mood and affect.  Nursing note and vitals reviewed.    ED Treatments / Results  Labs (all labs ordered are listed, but only abnormal results are displayed) Labs Reviewed  COMPREHENSIVE  METABOLIC PANEL - Abnormal; Notable for the following components:      Result Value   Glucose, Bld 128 (*)    Total Bilirubin 1.5 (*)    GFR calc non Af Amer 57 (*)    All other components within normal limits  CBC WITH DIFFERENTIAL/PLATELET - Abnormal; Notable for the following components:   WBC 20.6 (*)    Platelets 403 (*)    Neutro Abs 18.8 (*)    All other components within normal limits  PROTIME-INR  URINALYSIS, ROUTINE W REFLEX MICROSCOPIC  I-STAT TROPONIN, ED    EKG EKG Interpretation  Date/Time:  Monday September 24 2017 14:44:49 EDT Ventricular Rate:  76 PR Interval:  QRS Duration: 104 QT Interval:  404 QTC Calculation: 455 R Axis:   -9 Text Interpretation:  Sinus rhythm Probable left atrial enlargement Confirmed by Dene Gentry [phone]) on 09/24/2017 3:26:19 PM   Radiology Dg Chest 1 View  Result Date: 09/24/2017 CLINICAL DATA:  Severe right arm pain. EXAM: CHEST  1 VIEW COMPARISON:  08/04/2005 FINDINGS: There is a fracture dislocation at the right shoulder. There is a displaced fragment of the humeral head. Humeral head is anteriorly and inferiorly displaced. Heart size and pulmonary vascularity are normal. Lungs are clear. Aortic atherosclerosis. IMPRESSION: Fracture dislocation at the right glenohumeral joint as described. Aortic Atherosclerosis (ICD10-I70.0). Electronically Signed   By: Lorriane Shire M.D.   On: 09/24/2017 13:44   Dg Shoulder 1 View Right  Result Date: 09/24/2017 CLINICAL DATA:  Fracture dislocation at the right shoulder. Attempted reduction. EXAM: RIGHT SHOULDER - 1 VIEW 4:01 p.m. COMPARISON:  Radiographs dated 09/24/2017 at 1:20 p.m. FINDINGS: There has been no change in the anterior dislocation and fracture as described on the prior study. IMPRESSION: Unsuccessful attempted reduction of proximal right humeral anterior dislocation. Displaced fracture of the posterolateral aspect of the right humeral head. Electronically Signed   By: Lorriane Shire M.D.   On: 09/24/2017 16:25   Dg Shoulder Right  Result Date: 09/24/2017 CLINICAL DATA:  Sudden onset of right arm pain this morning. EXAM: RIGHT SHOULDER - 2+ VIEW COMPARISON:  None. FINDINGS: There is anterior inferior dislocation of the right glenohumeral joint. There is a comminuted fracture of the proximal right humerus with a displaced fracture involving the greater tuberosity. There is a possible nondisplaced fracture traversing the humeral neck as well, less conclusively demonstrated. No definite scapular fracture. Previous median sternotomy noted. IMPRESSION: Anteriorly dislocated right humeral head with displaced avulsion fracture of the greater tuberosity. Extension of the fracture across the right humeral neck is difficult to exclude. Electronically Signed   By: Richardean Sale M.D.   On: 09/24/2017 13:34   Ct Head Wo Contrast  Result Date: 09/24/2017 CLINICAL DATA:  Recent fall EXAM: CT HEAD WITHOUT CONTRAST CT CERVICAL SPINE WITHOUT CONTRAST TECHNIQUE: Multidetector CT imaging of the head and cervical spine was performed following the standard protocol without intravenous contrast. Multiplanar CT image reconstructions of the cervical spine were also generated. COMPARISON:  None. FINDINGS: CT HEAD FINDINGS Brain: Mild atrophic changes are identified. No findings to suggest acute hemorrhage, acute infarction or space-occupying mass lesion are noted. Vascular: No hyperdense vessel or unexpected calcification. Skull: Normal. Negative for fracture or focal lesion. Sinuses/Orbits: No acute finding. Other: None. CT CERVICAL SPINE FINDINGS Alignment: Within normal limits. Skull base and vertebrae: 7 cervical segments are well visualized. Vertebral body height is well maintained. Disc space narrowing is noted at C5-6 and C6-7. Osteophytic changes are noted. Mild facet hypertrophic changes are noted. No acute fracture or acute facet abnormality is noted. Soft tissues and spinal canal: Surrounding  soft tissues demonstrate a 16 mm hypodensity within the left lobe of the thyroid. No soft tissue mass lesion is seen. No other focal abnormality is noted. Upper chest: Within normal limits. Other: No acute abnormality noted. IMPRESSION: CT of the head: Mild atrophic changes without acute abnormality. CT of the cervical spine: Mild degenerative changes without acute abnormality. Electronically Signed   By: Inez Catalina M.D.   On: 09/24/2017 14:18   Ct Cervical Spine Wo Contrast  Result Date: 09/24/2017 CLINICAL DATA:  Recent fall EXAM: CT HEAD WITHOUT CONTRAST CT CERVICAL SPINE WITHOUT CONTRAST TECHNIQUE:  Multidetector CT imaging of the head and cervical spine was performed following the standard protocol without intravenous contrast. Multiplanar CT image reconstructions of the cervical spine were also generated. COMPARISON:  None. FINDINGS: CT HEAD FINDINGS Brain: Mild atrophic changes are identified. No findings to suggest acute hemorrhage, acute infarction or space-occupying mass lesion are noted. Vascular: No hyperdense vessel or unexpected calcification. Skull: Normal. Negative for fracture or focal lesion. Sinuses/Orbits: No acute finding. Other: None. CT CERVICAL SPINE FINDINGS Alignment: Within normal limits. Skull base and vertebrae: 7 cervical segments are well visualized. Vertebral body height is well maintained. Disc space narrowing is noted at C5-6 and C6-7. Osteophytic changes are noted. Mild facet hypertrophic changes are noted. No acute fracture or acute facet abnormality is noted. Soft tissues and spinal canal: Surrounding soft tissues demonstrate a 16 mm hypodensity within the left lobe of the thyroid. No soft tissue mass lesion is seen. No other focal abnormality is noted. Upper chest: Within normal limits. Other: No acute abnormality noted. IMPRESSION: CT of the head: Mild atrophic changes without acute abnormality. CT of the cervical spine: Mild degenerative changes without acute abnormality.  Electronically Signed   By: Inez Catalina M.D.   On: 09/24/2017 14:18   Dg Humerus Right  Result Date: 09/24/2017 CLINICAL DATA:  Right shoulder pain. EXAM: RIGHT HUMERUS - 2+ VIEW COMPARISON:  None. FINDINGS: There is a fracture dislocation at the glenohumeral joint. The humeral head is anteriorly dislocated. There is a displaced fragment of the posterior aspect of the humeral head. The remainder of the humerus is intact. IMPRESSION: Fracture dislocation at the glenohumeral joint as described. Electronically Signed   By: Lorriane Shire M.D.   On: 09/24/2017 13:45    Procedures .Sedation Date/Time: 09/24/2017 6:00 PM Performed by: Valarie Merino, MD Authorized by: Valarie Merino, MD   Consent:    Consent obtained:  Verbal   Consent given by:  Patient   Risks discussed:  Allergic reaction, dysrhythmia, inadequate sedation, nausea, prolonged hypoxia resulting in organ damage, prolonged sedation necessitating reversal, respiratory compromise necessitating ventilatory assistance and intubation and vomiting   Alternatives discussed:  Analgesia without sedation, anxiolysis and regional anesthesia Universal protocol:    Procedure explained and questions answered to patient or proxy's satisfaction: yes     Relevant documents present and verified: yes     Test results available and properly labeled: yes     Imaging studies available: yes     Required blood products, implants, devices, and special equipment available: yes     Site/side marked: yes     Immediately prior to procedure a time out was called: yes     Patient identity confirmation method:  Verbally with patient, anonymous protocol, patient vented/unresponsive, arm band, hospital-assigned identification number and provided demographic data Indications:    Procedure performed:  Dislocation reduction   Procedure necessitating sedation performed by:  Physician performing sedation   Intended level of sedation:  Deep Pre-sedation  assessment:    Time since last food or drink:  12   ASA classification: class 1 - normal, healthy patient     Neck mobility: normal     Mouth opening:  3 or more finger widths   Thyromental distance:  4 finger widths   Mallampati score:  I - soft palate, uvula, fauces, pillars visible   Pre-sedation assessments completed and reviewed: airway patency, cardiovascular function, hydration status, mental status, nausea/vomiting, pain level, respiratory function and temperature   Immediate pre-procedure details:    Reassessment:  Patient reassessed immediately prior to procedure     Reviewed: vital signs, relevant labs/tests and NPO status     Verified: bag valve mask available, emergency equipment available, intubation equipment available, IV patency confirmed, oxygen available and suction available   Procedure details (see MAR for exact dosages):    Preoxygenation:  Nasal cannula   Sedation:  Propofol   Intra-procedure monitoring:  Blood pressure monitoring, cardiac monitor, continuous pulse oximetry, frequent LOC assessments, frequent vital sign checks and continuous capnometry   Intra-procedure events: none     Total Provider sedation time (minutes):  45 Post-procedure details:    Attendance: Constant attendance by certified staff until patient recovered     Recovery: Patient returned to pre-procedure baseline     Post-sedation assessments completed and reviewed: airway patency, cardiovascular function, hydration status, mental status, nausea/vomiting, pain level, respiratory function and temperature     Patient is stable for discharge or admission: yes     Patient tolerance:  Tolerated well, no immediate complications Comments:     Patient's shoulder required multiple reduction attempts - the joint is unstable and easily dislocates.  [facility] Passey Injury Treatment Date/Time: 09/24/2017 6:06 PM Performed by: Valarie Merino, MD Authorized by: Valarie Merino, MD   Consent:    Consent obtained:   Verbal   Consent given by:  Patient   Risks discussed:  Fracture, irreducible dislocation, recurrent dislocation, nerve damage, restricted joint movement, stiffness and vascular damage   Alternatives discussed:  No treatment and alternative treatmentInjury location: shoulder Location details: right shoulder Injury type: dislocation Dislocation type: anterior Hill-Sachs deformity: no Chronicity: new Pre-procedure neurovascular assessment: neurovascularly intact Pre-procedure distal perfusion: normal Pre-procedure neurological function: normal Pre-procedure range of motion: reduced  Anesthesia: Local anesthesia used: no  Patient sedated: Yes. Refer to sedation procedure documentation for details of sedation. Manipulation performed: yes Reduction method: external rotation Reduction successful: yes Immobilization: sling Post-procedure neurovascular assessment: post-procedure neurovascularly intact Post-procedure distal perfusion: normal Post-procedure neurological function: normal Post-procedure range of motion: unchanged Patient tolerance: Patient tolerated the procedure well with no immediate complications    (including critical care time)  Medications Ordered in ED Medications  propofol (DIPRIVAN) 10 mg/mL bolus/IV push 35.1 mg (35.1 mg Intravenous See Procedure Record 09/24/17 1650)  morphine 4 MG/ML injection 4 mg (has no administration in time range)  morphine 2 MG/ML injection 2 mg (2 mg Intravenous Given 09/24/17 1311)  morphine 4 MG/ML injection 4 mg (4 mg Intravenous Given 09/24/17 1429)  0.9 %  sodium chloride infusion (1,000 mLs Intravenous New Bag/Given 09/24/17 1645)  propofol (DIPRIVAN) 10 mg/mL bolus/IV push (17.525 mg Intravenous Given 09/24/17 1645)     Initial Impression / Assessment and Plan / ED Course  I have reviewed the triage vital signs and the nursing notes.  Pertinent labs & imaging results that were available during my care of the patient were  reviewed by me and considered in my medical decision making (see chart for details).     MDM  Screen complete  Patient with unwitnessed and unclear fall resulting in a right shoulder dislocation and fracture.  Patient will require admission for medical work-up for possible syncope.  Dr. Lyla Glassing - Ortho - is aware of the patient's shoulder injury.  Patient's shoulder is unstable and despite multiple reduction attempts it may become dislocated again.  Per Ortho's request, patient is to be transferred to [facility] for evaluation by Ortho tomorrow.   Hospitalist service is aware of case and need for transfer to [facility].  Final Clinical Impressions(s) / ED Diagnoses   Final diagnoses:  Anterior dislocation of right shoulder, initial encounter  Syncope, unspecified syncope type  Closed fracture of proximal end of right humerus, unspecified fracture morphology, initial encounter    ED Discharge Orders    None       Valarie Merino, MD 09/24/17 1807

## 2017-09-24 NOTE — ED Notes (Signed)
ED Provider at bedside. 

## 2017-09-24 NOTE — H&P (Addendum)
History and Physical    Megan Pitts OFB:[id number] DOB: May 01, 1927 DOA: 09/24/2017  PCP: Marton Redwood, MD   Patient coming from: Home    Chief Complaint: Pain on the right shoulder joint  HPI: Megan Pitts is a 84 y.o. female with past medical history significant for hypertension, hyperlipidemia, diastolic CHF, aortic stenosis status post aortic valve replacement who presents from home with complaints of pain on her right shoulder.  Patient was also noted to have bruises on her right shoulder.  Patient denies any history of fall or trauma.  She was doing her usual stuff until yesterday.  She lives by herself.  She ambulates with the help of cane but is usually able to do her normal activities without any difficulty.  This morning,  she started having pain on her right shoulder and arm.  She was brought to the emergency department by her family.  Imagings showed she has fracture dislocation of the right glenohumeral joint.  Interestingly, patient did not report  any fall or trauma. Patient seen and examined the bedside in the emergency department.  She complains of pain on her right glenohumeral joint.  She denies any chest pain, palpitations, fever, chills shortness of breath, abdominal pain, dysuria, nausea, vomiting, diarrhea or headache.  ED Course: We were asked to admit the patient.  Orthopedics consulted by the ED physician.  Numerous attempt to reduce the fracture dislocation  in the emergency department were unsuccessful.  Review of Systems: As per HPI otherwise 10 point review of systems negative.    Past Medical History:  Diagnosis Date  . A-fib ([place])   . Aortic stenosis    s/p AVR 07/2005(porcine)  . Carotid bruit 09/2008   <50% B  . GERD (gastroesophageal reflux disease)   . Gilbert's syndrome   . Hyperlipidemia   . Hypertension   . IFG (impaired fasting glucose)   . OAB (overactive bladder)   . Osteopenia   . Phlebitis    UPPER EXTREMITY  . PVC's (premature ventricular  contractions)   . Shingles   . Varicose veins    s/p treatment   . Vertigo     Past Surgical History:  Procedure Laterality Date  . ABDOMINAL HYSTERECTOMY  1963   partial  . AORTIC VALVE REPLACEMENT  08/01/2005   WITH A #23MM [place] STENTLESS PORCINE AORTIC VALVE  . CARDIAC CATHETERIZATION  07/25/2005   EF 60% THE AORTIC VALVE IS HEAVILY CALCIFIED WITH REDUCED OPENING. NO MVP  . FOOT SURGERY    . HAND SURGERY    . HEMORRHOID SURGERY  1980's  . US ECHOCARDIOGRAPHY  04/14/2008   EF 55-60%. NORMAL  . US ECHOCARDIOGRAPHY  11/21/2005   EF 55-60%  . US ECHOCARDIOGRAPHY  07/20/2005   EF 55-60%  . US ECHOCARDIOGRAPHY  10/19/2004   EF 55-60%  . VARICOSE VEIN SURGERY  2010   laser treatment     reports that she has never smoked. She has never used smokeless tobacco. She reports that she does not drink alcohol or use drugs.  Allergies  Allergen Reactions  . Synthroid [Levothyroxine Sodium]     Pt stated not sure what happens when she takes this    Family History  Problem Relation Age of Onset  . Pneumonia Mother 42  . Alzheimer's disease Father   . Leukemia Brother   . Alcohol abuse Brother      Prior to Admission medications   Medication Sig Start Date End Date Taking? Authorizing Provider  acetaminophen (  TYLENOL) 500 MG tablet Take 500 mg by mouth every 6 (six) hours as needed for mild pain.   Yes [provider]  amLODipine (NORVASC) 10 MG tablet Take 10 mg by mouth daily.  04/20/16  Yes [provider]  aspirin 81 MG tablet Take 81 mg by mouth daily.     Yes [provider]  donepezil (ARICEPT) 10 MG tablet Take 10 mg by mouth at bedtime.  01/11/15  Yes [provider]  hydrochlorothiazide (HYDRODIURIL) 25 MG tablet Take 25 mg by mouth daily.   Yes [provider]  Multiple Vitamin (MULTIVITAMIN) tablet Take 1 tablet by mouth daily.   Yes [provider]  olmesartan (BENICAR) 40 MG tablet Take 40 mg by mouth daily.   Yes  [provider]  oxybutynin (DITROPAN-XL) 10 MG 24 hr tablet Take 10 mg by mouth at bedtime.  02/07/17  Yes [provider]  rosuvastatin (CRESTOR) 40 MG tablet Take 20 mg by mouth daily.    Yes [provider]  Vitamin D, Ergocalciferol, (DRISDOL) 50000 units CAPS capsule Take 50,000 Units by mouth every 7 (seven) days. On Sundays   Yes [provider]    Physical Exam: Vitals:   09/24/17 1644 09/24/17 1645 09/24/17 1646 09/24/17 1648  BP: (!) 180/60 (!) 161/72 (!) 161/72 (!) 148/51  Pulse: 74 77 77 80  Resp: (!) 22 (!) 25 16 18   Temp:      TempSrc:      SpO2: 99% 97% 97% 98%  Weight:        Constitutional: In mild distress secondary to pain on the fracture area Vitals:   09/24/17 1644 09/24/17 1645 09/24/17 1646 09/24/17 1648  BP: (!) 180/60 (!) 161/72 (!) 161/72 (!) 148/51  Pulse: 74 77 77 80  Resp: (!) 22 (!) 25 16 18   Temp:      TempSrc:      SpO2: 99% 97% 97% 98%  Weight:       Eyes: PERRL, lids and conjunctivae normal ENMT: Mucous membranes are moist. Posterior pharynx clear of any exudate or lesions.Normal dentition.  Neck: normal, supple, no masses, no thyromegaly Respiratory: clear to auscultation bilaterally, no wheezing, no crackles. Normal respiratory effort. No accessory muscle use.  Cardiovascular: Regular rate and rhythm, Murmur heard ,No  rubs / gallops. No extremity edema. 2+ pedal pulses. No carotid bruits.  Abdomen: no tenderness, no masses palpated. No hepatosplenomegaly. Bowel sounds positive.  Musculoskeletal: no clubbing / cyanosis. . Good ROM, no contractures. Normal muscle tone. Bruises on the right arm, tenderness and mild swelling on the right glenohumeral joint. Skin: Bruise on the right arm, lesions, ulcers. No induration Neurologic: CN 2-12 grossly intact. Sensation intact, DTR normal. Strength 5/5 in all 4.  Psychiatric: Normal judgment and insight. Alert and oriented x 3. Normal mood.   Foley  Catheter:None  Labs on Admission: I have personally reviewed following labs and imaging studies  CBC: Recent Labs  Lab 09/24/17 1257  WBC 20.6*  NEUTROABS 18.8*  HGB 13.9  HCT 40.4  MCV 88.2  PLT 960*   Basic Metabolic Panel: Recent Labs  Lab 09/24/17 1257  NA 140  K 3.6  CL 103  CO2 25  GLUCOSE 128*  BUN 20  CREATININE 0.89  CALCIUM 10.0   GFR: CrCl cannot be calculated (Unknown ideal weight.). Liver Function Tests: Recent Labs  Lab 09/24/17 1257  AST 27  ALT 18  ALKPHOS 54  BILITOT 1.5*  PROT 7.9  ALBUMIN  4.6   No results for input(s): LIPASE, AMYLASE in the last 168 hours. No results for input(s): AMMONIA in the last 168 hours. Coagulation Profile: Recent Labs  Lab 09/24/17 1257  INR 0.98   Cardiac Enzymes: No results for input(s): CKTOTAL, CKMB, CKMBINDEX, TROPONINI in the last 168 hours. BNP (last 3 results) No results for input(s): PROBNP in the last 8760 hours. HbA1C: No results for input(s): HGBA1C in the last 72 hours. CBG: No results for input(s): GLUCAP in the last 168 hours. Lipid Profile: No results for input(s): CHOL, HDL, LDLCALC, TRIG, CHOLHDL, LDLDIRECT in the last 72 hours. Thyroid Function Tests: No results for input(s): TSH, T4TOTAL, FREET4, T3FREE, THYROIDAB in the last 72 hours. Anemia Panel: No results for input(s): VITAMINB12, FOLATE, FERRITIN, TIBC, IRON, RETICCTPCT in the last 72 hours. Urine analysis: No results found for: COLORURINE, APPEARANCEUR, LABSPEC, PHURINE, GLUCOSEU, HGBUR, BILIRUBINUR, KETONESUR, PROTEINUR, UROBILINOGEN, NITRITE, LEUKOCYTESUR  Radiological Exams on Admission: Dg Chest 1 View  Result Date: 09/24/2017 CLINICAL DATA:  Severe right arm pain. EXAM: CHEST  1 VIEW COMPARISON:  08/04/2005 FINDINGS: There is a fracture dislocation at the right shoulder. There is a displaced fragment of the humeral head. Humeral head is anteriorly and inferiorly displaced. Heart size and pulmonary vascularity are normal.  Lungs are clear. Aortic atherosclerosis. IMPRESSION: Fracture dislocation at the right glenohumeral joint as described. Aortic Atherosclerosis (ICD10-I70.0). Electronically Signed   By: Lorriane Shire M.D.   On: 09/24/2017 13:44   Dg Shoulder 1 View Right  Result Date: 09/24/2017 CLINICAL DATA:  Fracture dislocation at the right shoulder. Attempted reduction. EXAM: RIGHT SHOULDER - 1 VIEW 4:01 p.m. COMPARISON:  Radiographs dated 09/24/2017 at 1:20 p.m. FINDINGS: There has been no change in the anterior dislocation and fracture as described on the prior study. IMPRESSION: Unsuccessful attempted reduction of proximal right humeral anterior dislocation. Displaced fracture of the posterolateral aspect of the right humeral head. Electronically Signed   By: Lorriane Shire M.D.   On: 09/24/2017 16:25   Dg Shoulder Right  Result Date: 09/24/2017 CLINICAL DATA:  Sudden onset of right arm pain this morning. EXAM: RIGHT SHOULDER - 2+ VIEW COMPARISON:  None. FINDINGS: There is anterior inferior dislocation of the right glenohumeral joint. There is a comminuted fracture of the proximal right humerus with a displaced fracture involving the greater tuberosity. There is a possible nondisplaced fracture traversing the humeral neck as well, less conclusively demonstrated. No definite scapular fracture. Previous median sternotomy noted. IMPRESSION: Anteriorly dislocated right humeral head with displaced avulsion fracture of the greater tuberosity. Extension of the fracture across the right humeral neck is difficult to exclude. Electronically Signed   By: Richardean Sale M.D.   On: 09/24/2017 13:34   Ct Head Wo Contrast  Result Date: 09/24/2017 CLINICAL DATA:  Recent fall EXAM: CT HEAD WITHOUT CONTRAST CT CERVICAL SPINE WITHOUT CONTRAST TECHNIQUE: Multidetector CT imaging of the head and cervical spine was performed following the standard protocol without intravenous contrast. Multiplanar CT image reconstructions of the  cervical spine were also generated. COMPARISON:  None. FINDINGS: CT HEAD FINDINGS Brain: Mild atrophic changes are identified. No findings to suggest acute hemorrhage, acute infarction or space-occupying mass lesion are noted. Vascular: No hyperdense vessel or unexpected calcification. Skull: Normal. Negative for fracture or focal lesion. Sinuses/Orbits: No acute finding. Other: None. CT CERVICAL SPINE FINDINGS Alignment: Within normal limits. Skull base and vertebrae: 7 cervical segments are well visualized. Vertebral body height is well maintained. Disc space narrowing is noted at C5-6 and  C6-7. Osteophytic changes are noted. Mild facet hypertrophic changes are noted. No acute fracture or acute facet abnormality is noted. Soft tissues and spinal canal: Surrounding soft tissues demonstrate a 16 mm hypodensity within the left lobe of the thyroid. No soft tissue mass lesion is seen. No other focal abnormality is noted. Upper chest: Within normal limits. Other: No acute abnormality noted. IMPRESSION: CT of the head: Mild atrophic changes without acute abnormality. CT of the cervical spine: Mild degenerative changes without acute abnormality. Electronically Signed   By: Inez Catalina M.D.   On: 09/24/2017 14:18   Ct Cervical Spine Wo Contrast  Result Date: 09/24/2017 CLINICAL DATA:  Recent fall EXAM: CT HEAD WITHOUT CONTRAST CT CERVICAL SPINE WITHOUT CONTRAST TECHNIQUE: Multidetector CT imaging of the head and cervical spine was performed following the standard protocol without intravenous contrast. Multiplanar CT image reconstructions of the cervical spine were also generated. COMPARISON:  None. FINDINGS: CT HEAD FINDINGS Brain: Mild atrophic changes are identified. No findings to suggest acute hemorrhage, acute infarction or space-occupying mass lesion are noted. Vascular: No hyperdense vessel or unexpected calcification. Skull: Normal. Negative for fracture or focal lesion. Sinuses/Orbits: No acute finding.  Other: None. CT CERVICAL SPINE FINDINGS Alignment: Within normal limits. Skull base and vertebrae: 7 cervical segments are well visualized. Vertebral body height is well maintained. Disc space narrowing is noted at C5-6 and C6-7. Osteophytic changes are noted. Mild facet hypertrophic changes are noted. No acute fracture or acute facet abnormality is noted. Soft tissues and spinal canal: Surrounding soft tissues demonstrate a 16 mm hypodensity within the left lobe of the thyroid. No soft tissue mass lesion is seen. No other focal abnormality is noted. Upper chest: Within normal limits. Other: No acute abnormality noted. IMPRESSION: CT of the head: Mild atrophic changes without acute abnormality. CT of the cervical spine: Mild degenerative changes without acute abnormality. Electronically Signed   By: Inez Catalina M.D.   On: 09/24/2017 14:18   Dg Humerus Right  Result Date: 09/24/2017 CLINICAL DATA:  Right shoulder pain. EXAM: RIGHT HUMERUS - 2+ VIEW COMPARISON:  None. FINDINGS: There is a fracture dislocation at the glenohumeral joint. The humeral head is anteriorly dislocated. There is a displaced fragment of the posterior aspect of the humeral head. The remainder of the humerus is intact. IMPRESSION: Fracture dislocation at the glenohumeral joint as described. Electronically Signed   By: Lorriane Shire M.D.   On: 09/24/2017 13:45     Assessment/Plan Principal Problem:   Fracture dislocation of right shoulder joint Active Problems:   Hypertension   Hyperlipidemia   S/P AVR   Shoulder fracture, right   Chronic diastolic CHF (congestive heart failure) (HCC)   Leucocytosis  Fracture dislocation of the right shoulder joint: X-ray showed fracture dislocation of the glenohumeral joint.  Orthopedics has been consulted.  Continue pain medications.    PT/OT will be requested for consult. Numerous attempts to reduce the fracture dislocation were unsuccessful.  Orthopedics requested patient to be  transferred to [facility]. Patient denies any history of syncope, fall or trauma.  Will check orthostatics after her pain is well controlled.  Most likely this fracture is associated with osteoporosis in this elderly lady.  Hypertension: Noted to be hypertensive in the emergency department.  She has not taken her home medications today.  We will resume her home medications.  We will continue to monitor her blood pressure.  Hyperlipidemia: Continue Zocor  History of aortic stenosis: Status post aortic valve replacement with porcine valve about  10 years ago.  On aspirin.  She has systolic murmur.  Chronic diastolic CHF: Not on diuretics.  Currently her CHF is well compensated.  Echocardiogram on 3/19 showed ejection fraction of 65 to 70%, no wall motion abnormality, grade 2 diastolic dysfunction.  Leukocytosis: Most likely reactive.  Will check CBC tomorrow morning.Will not start on antibiotics.Will order a UA.  Severity of Illness: The appropriate patient status for this patient is OBSERVATION.    DVT prophylaxis: Lovenox Code Status: Full Family Communication: Family members present at the bedside Consults called: Orthopedics     Shelly Coss MD [facility] Pager [phone]  If 7PM-7AM, please contact night-coverage [email or web address] Password [facility]  09/24/2017, 5:04 PM

## 2017-09-24 NOTE — ED Notes (Signed)
Multiple family members have approached this RN stating that the patient is in severe pain and no MD has been to see the patient. Advised family as well as patient that there is a wait time associated with the emergency department and a provider will be in as soon as possible.

## 2017-09-24 NOTE — ED Notes (Addendum)
Attempted to call report to [facility] on 5N. Nurse unavailable to take report at this time.

## 2017-09-24 NOTE — ED Notes (Signed)
[facility] called to get report from RN, Rn at bedside at that time. Stated the same to [facility] and they stated they would wait until arriving to facility to obtain report.

## 2017-09-24 NOTE — Consult Note (Signed)
ORTHOPAEDIC CONSULTATION  REQUESTING PHYSICIAN: Shelly Coss, MD  PCP:  Marton Redwood, MD  Chief Complaint: Right shoulder pain.  HPI: Megan Pitts is a 84 y.o. female who complains of right shoulder pain that she noticed this morning.  She does not remember any falls or injuries.  Apparently, her shoulder was normal yesterday.  She does not have any other pain or acute issues. The hospitalist has been consulted for admission.  Past Medical History:  Diagnosis Date  . A-fib ([place])   . Aortic stenosis    s/p AVR 07/2005(porcine)  . Carotid bruit 09/2008   <50% B  . GERD (gastroesophageal reflux disease)   . Gilbert's syndrome   . Hyperlipidemia   . Hypertension   . IFG (impaired fasting glucose)   . OAB (overactive bladder)   . Osteopenia   . Phlebitis    UPPER EXTREMITY  . PVC's (premature ventricular contractions)   . Shingles   . Varicose veins    s/p treatment   . Vertigo    Past Surgical History:  Procedure Laterality Date  . ABDOMINAL HYSTERECTOMY  1963   partial  . AORTIC VALVE REPLACEMENT  08/01/2005   WITH A #23MM [place] STENTLESS PORCINE AORTIC VALVE  . CARDIAC CATHETERIZATION  07/25/2005   EF 60% THE AORTIC VALVE IS HEAVILY CALCIFIED WITH REDUCED OPENING. NO MVP  . FOOT SURGERY    . HAND SURGERY    . HEMORRHOID SURGERY  1980's  . US ECHOCARDIOGRAPHY  04/14/2008   EF 55-60%. NORMAL  . US ECHOCARDIOGRAPHY  11/21/2005   EF 55-60%  . US ECHOCARDIOGRAPHY  07/20/2005   EF 55-60%  . US ECHOCARDIOGRAPHY  10/19/2004   EF 55-60%  . VARICOSE VEIN SURGERY  2010   laser treatment   Social History   Socioeconomic History  . Marital status: Widowed    Spouse name: Not on file  . Number of children: 2  . Years of education: Not on file  . Highest education level: Not on file  Occupational History  . Occupation: retired  Scientific laboratory technician  . Financial resource strain: Not on file  . Food insecurity:    Worry: Not on file    Inability: Not on file  .  Transportation needs:    Medical: Not on file    Non-medical: Not on file  Tobacco Use  . Smoking status: Never Smoker  . Smokeless tobacco: Never Used  Substance and Sexual Activity  . Alcohol use: No  . Drug use: No  . Sexual activity: Not on file  Lifestyle  . Physical activity:    Days per week: Not on file    Minutes per session: Not on file  . Stress: Not on file  Relationships  . Social connections:    Talks on phone: Not on file    Gets together: Not on file    Attends religious service: Not on file    Active member of club or organization: Not on file    Attends meetings of clubs or organizations: Not on file    Relationship status: Not on file  Other Topics Concern  . Not on file  Social History Narrative  . Not on file   Family History  Problem Relation Age of Onset  . Pneumonia Mother 31  . Alzheimer's disease Father   . Leukemia Brother   . Alcohol abuse Brother    Allergies  Allergen Reactions  . Synthroid [Levothyroxine Sodium]     Pt stated  not sure what happens when she takes this   Prior to Admission medications   Medication Sig Start Date End Date Taking? Authorizing Provider  acetaminophen (TYLENOL) 500 MG tablet Take 500 mg by mouth every 6 (six) hours as needed for mild pain.   Yes [provider]  amLODipine (NORVASC) 10 MG tablet Take 10 mg by mouth daily.  04/20/16  Yes [provider]  aspirin 81 MG tablet Take 81 mg by mouth daily.     Yes [provider]  donepezil (ARICEPT) 10 MG tablet Take 10 mg by mouth at bedtime.  01/11/15  Yes [provider]  hydrochlorothiazide (HYDRODIURIL) 25 MG tablet Take 25 mg by mouth daily.   Yes [provider]  Multiple Vitamin (MULTIVITAMIN) tablet Take 1 tablet by mouth daily.   Yes [provider]  olmesartan (BENICAR) 40 MG tablet Take 40 mg by mouth daily.   Yes [provider]  oxybutynin (DITROPAN-XL) 10 MG 24 hr tablet Take 10 mg by mouth  at bedtime.  02/07/17  Yes [provider]  rosuvastatin (CRESTOR) 40 MG tablet Take 20 mg by mouth daily.    Yes [provider]  Vitamin D, Ergocalciferol, (DRISDOL) 50000 units CAPS capsule Take 50,000 Units by mouth every 7 (seven) days. On Sundays   Yes [provider]   Dg Chest 1 View  Result Date: 09/24/2017 CLINICAL DATA:  Severe right arm pain. EXAM: CHEST  1 VIEW COMPARISON:  08/04/2005 FINDINGS: There is a fracture dislocation at the right shoulder. There is a displaced fragment of the humeral head. Humeral head is anteriorly and inferiorly displaced. Heart size and pulmonary vascularity are normal. Lungs are clear. Aortic atherosclerosis. IMPRESSION: Fracture dislocation at the right glenohumeral joint as described. Aortic Atherosclerosis (ICD10-I70.0). Electronically Signed   By: Lorriane Shire M.D.   On: 09/24/2017 13:44   Dg Shoulder 1 View Right  Result Date: 09/24/2017 CLINICAL DATA:  Second reduction attempt of the right shoulder. EXAM: RIGHT SHOULDER - 1 VIEW COMPARISON:  Right shoulder radiograph 09/24/2017 at 4:01. FINDINGS: The right humerus remains dislocated anteriorly and inferiorly. A fracture fragment from the right humerus projects lateral to the glenoid. Lung volumes are low. IMPRESSION: 1. Persistent anterior inferior right shoulder dislocation. 2. Stable fracture. Electronically Signed   By: San Morelle M.D.   On: 09/24/2017 17:12   Dg Shoulder 1 View Right  Result Date: 09/24/2017 CLINICAL DATA:  Fracture dislocation at the right shoulder. Attempted reduction. EXAM: RIGHT SHOULDER - 1 VIEW 4:01 p.m. COMPARISON:  Radiographs dated 09/24/2017 at 1:20 p.m. FINDINGS: There has been no change in the anterior dislocation and fracture as described on the prior study. IMPRESSION: Unsuccessful attempted reduction of proximal right humeral anterior dislocation. Displaced fracture of the posterolateral aspect of the right humeral head.  Electronically Signed   By: Lorriane Shire M.D.   On: 09/24/2017 16:25   Dg Shoulder Right  Result Date: 09/24/2017 CLINICAL DATA:  Sudden onset of right arm pain this morning. EXAM: RIGHT SHOULDER - 2+ VIEW COMPARISON:  None. FINDINGS: There is anterior inferior dislocation of the right glenohumeral joint. There is a comminuted fracture of the proximal right humerus with a displaced fracture involving the greater tuberosity. There is a possible nondisplaced fracture traversing the humeral neck as well, less conclusively demonstrated. No definite scapular fracture. Previous median sternotomy noted. IMPRESSION: Anteriorly dislocated right humeral head with displaced avulsion fracture of the greater tuberosity. Extension of the fracture across the right humeral  neck is difficult to exclude. Electronically Signed   By: Richardean Sale M.D.   On: 09/24/2017 13:34   Ct Head Wo Contrast  Result Date: 09/24/2017 CLINICAL DATA:  Recent fall EXAM: CT HEAD WITHOUT CONTRAST CT CERVICAL SPINE WITHOUT CONTRAST TECHNIQUE: Multidetector CT imaging of the head and cervical spine was performed following the standard protocol without intravenous contrast. Multiplanar CT image reconstructions of the cervical spine were also generated. COMPARISON:  None. FINDINGS: CT HEAD FINDINGS Brain: Mild atrophic changes are identified. No findings to suggest acute hemorrhage, acute infarction or space-occupying mass lesion are noted. Vascular: No hyperdense vessel or unexpected calcification. Skull: Normal. Negative for fracture or focal lesion. Sinuses/Orbits: No acute finding. Other: None. CT CERVICAL SPINE FINDINGS Alignment: Within normal limits. Skull base and vertebrae: 7 cervical segments are well visualized. Vertebral body height is well maintained. Disc space narrowing is noted at C5-6 and C6-7. Osteophytic changes are noted. Mild facet hypertrophic changes are noted. No acute fracture or acute facet abnormality is noted. Soft  tissues and spinal canal: Surrounding soft tissues demonstrate a 16 mm hypodensity within the left lobe of the thyroid. No soft tissue mass lesion is seen. No other focal abnormality is noted. Upper chest: Within normal limits. Other: No acute abnormality noted. IMPRESSION: CT of the head: Mild atrophic changes without acute abnormality. CT of the cervical spine: Mild degenerative changes without acute abnormality. Electronically Signed   By: Inez Catalina M.D.   On: 09/24/2017 14:18   Ct Cervical Spine Wo Contrast  Result Date: 09/24/2017 CLINICAL DATA:  Recent fall EXAM: CT HEAD WITHOUT CONTRAST CT CERVICAL SPINE WITHOUT CONTRAST TECHNIQUE: Multidetector CT imaging of the head and cervical spine was performed following the standard protocol without intravenous contrast. Multiplanar CT image reconstructions of the cervical spine were also generated. COMPARISON:  None. FINDINGS: CT HEAD FINDINGS Brain: Mild atrophic changes are identified. No findings to suggest acute hemorrhage, acute infarction or space-occupying mass lesion are noted. Vascular: No hyperdense vessel or unexpected calcification. Skull: Normal. Negative for fracture or focal lesion. Sinuses/Orbits: No acute finding. Other: None. CT CERVICAL SPINE FINDINGS Alignment: Within normal limits. Skull base and vertebrae: 7 cervical segments are well visualized. Vertebral body height is well maintained. Disc space narrowing is noted at C5-6 and C6-7. Osteophytic changes are noted. Mild facet hypertrophic changes are noted. No acute fracture or acute facet abnormality is noted. Soft tissues and spinal canal: Surrounding soft tissues demonstrate a 16 mm hypodensity within the left lobe of the thyroid. No soft tissue mass lesion is seen. No other focal abnormality is noted. Upper chest: Within normal limits. Other: No acute abnormality noted. IMPRESSION: CT of the head: Mild atrophic changes without acute abnormality. CT of the cervical spine: Mild  degenerative changes without acute abnormality. Electronically Signed   By: Inez Catalina M.D.   On: 09/24/2017 14:18   Dg Shoulder Right Portable  Result Date: 09/24/2017 CLINICAL DATA:  Post reduction EXAM: PORTABLE RIGHT SHOULDER COMPARISON:  09/24/2017 FINDINGS: Glenohumeral alignment within normal limits on single frontal view. Slight decreased displacement comminuted greater tuberosity fracture fragment. The [facility] joint is intact. IMPRESSION: Slight decreased displacement of comminuted greater tuberosity fracture fragment. Glenohumeral alignment within normal limits on single frontal view. Electronically Signed   By: Donavan Foil M.D.   On: 09/24/2017 18:05   Dg Shoulder Right Portable  Result Date: 09/24/2017 CLINICAL DATA:  Post reduction attempt EXAM: PORTABLE RIGHT SHOULDER COMPARISON:  09/24/2017 FINDINGS: Acute mildly comminuted and displaced fracture involving the  greater tuberosity of the humerus. Improved alignment of right humeral head on frontal view but with persistent anterior subluxation on the Y-view. IMPRESSION: 1. Residual anterior displacement of the humeral head on the Y-view 2. Acute displaced and slightly comminuted fracture involving the greater tuberosity Electronically Signed   By: Donavan Foil M.D.   On: 09/24/2017 18:03   Dg Humerus Right  Result Date: 09/24/2017 CLINICAL DATA:  Right shoulder pain. EXAM: RIGHT HUMERUS - 2+ VIEW COMPARISON:  None. FINDINGS: There is a fracture dislocation at the glenohumeral joint. The humeral head is anteriorly dislocated. There is a displaced fragment of the posterior aspect of the humeral head. The remainder of the humerus is intact. IMPRESSION: Fracture dislocation at the glenohumeral joint as described. Electronically Signed   By: Lorriane Shire M.D.   On: 09/24/2017 13:45    Positive ROS: All other systems have been reviewed and were otherwise negative with the exception of those mentioned in the HPI and as above.  Physical  Exam: General: Alert, no acute distress Cardiovascular: No pedal edema Respiratory: No cyanosis, no use of accessory musculature GI: No organomegaly, abdomen is soft and non-tender Skin: No lesions in the area of chief complaint Neurologic: Sensation intact distally Psychiatric: Patient is competent for consent with normal mood and affect Lymphatic: No axillary or cervical lymphadenopathy  MUSCULOSKELETAL: Examination of the right upper extremity reveals significant ecchymosis and swelling to the right shoulder.  She has an obvious deformity.  The skin is intact.  She has intact motor function AIN, PIN, and ulnar motor nerves.  He has intact sensation in the median, ulnar, radial, musculocutaneous, and axillary distributions.  Palpable radial pulses present.  Assessment: Right proximal humerus fracture/dislocation  Plan: I discussed the findings with the patient.  I recommend closed reduction in the emergency department.  After verbal consent was obtained, the emergency department staff administered conscious sedation.  The fracture easily reduced with an audible and palpable clunk.  I placed a shoulder immobilizer on the patient.  As she began waking up, she moved her right upper extremity and the shoulder dislocated with an audible clunk.  The patient stated that her shoulder popped out again.  We did obtain x-rays that demonstrated recurrence of dislocation.  Conscious sedation was administered again by the emergency department staff, and I reduced the shoulder again.  An immobilizer was placed.  The patient was then aroused from conscious sedation.  Her neurovascular exam was unchanged.  Given her instability and proximal humerus fracture, I have discussed the patient with Dr. Victorino December, 1 of our shoulder specialist.  He requests a CT scan of the right shoulder and transfer to [facility] for further operative management. Sling, NWB RUE for now.    Bertram Savin, MD Cell [phone]     09/24/2017 7:19 PM

## 2017-09-24 NOTE — Progress Notes (Signed)
Called to receive report -- Santiago Glad,  stated that patient is currently under conscious sedation and they are looking to transfer the patient to [facility] as they were unable to successfully do the reduction of the fracture.  She would have the nurse call me back as soon as the plan of care has been established.

## 2017-09-24 NOTE — ED Notes (Signed)
Pt Transported to [facility] by [facility] .

## 2017-09-24 NOTE — Progress Notes (Signed)
Attempted to call to receive report -- no answer in the ED.  Will return call in couple minutes.

## 2017-09-24 NOTE — ED Notes (Signed)
Patient transported to CT 

## 2017-09-24 NOTE — ED Triage Notes (Signed)
Transported by [facility] from home--sudden onset of right arm pain that she noticed this morning at 2 a.m. Also reports bilateral lower extremity numbness. Ambulatory with EMS at the scene.

## 2017-09-25 ENCOUNTER — Observation Stay (HOSPITAL_COMMUNITY): Payer: Medicare Other

## 2017-09-25 ENCOUNTER — Encounter (HOSPITAL_COMMUNITY): Payer: Self-pay | Admitting: Radiology

## 2017-09-25 DIAGNOSIS — S4291XD Fracture of right shoulder girdle, part unspecified, subsequent encounter for fracture with routine healing: Secondary | ICD-10-CM

## 2017-09-25 DIAGNOSIS — I5032 Chronic diastolic (congestive) heart failure: Secondary | ICD-10-CM

## 2017-09-25 DIAGNOSIS — Z952 Presence of prosthetic heart valve: Secondary | ICD-10-CM

## 2017-09-25 DIAGNOSIS — D72829 Elevated white blood cell count, unspecified: Secondary | ICD-10-CM

## 2017-09-25 DIAGNOSIS — I10 Essential (primary) hypertension: Secondary | ICD-10-CM | POA: Diagnosis not present

## 2017-09-25 DIAGNOSIS — S43014A Anterior dislocation of right humerus, initial encounter: Secondary | ICD-10-CM

## 2017-09-25 DIAGNOSIS — S43084A Other dislocation of right shoulder joint, initial encounter: Secondary | ICD-10-CM | POA: Diagnosis not present

## 2017-09-25 DIAGNOSIS — E7800 Pure hypercholesterolemia, unspecified: Secondary | ICD-10-CM | POA: Diagnosis not present

## 2017-09-25 DIAGNOSIS — S42201A Unspecified fracture of upper end of right humerus, initial encounter for closed fracture: Secondary | ICD-10-CM

## 2017-09-25 DIAGNOSIS — R410 Disorientation, unspecified: Secondary | ICD-10-CM | POA: Diagnosis not present

## 2017-09-25 LAB — BASIC METABOLIC PANEL
ANION GAP: 14 (ref 5–15)
BUN: 24 mg/dL — AB (ref 8–23)
CO2: 20 mmol/L — ABNORMAL LOW (ref 22–32)
Calcium: 9.6 mg/dL (ref 8.9–10.3)
Chloride: 105 mmol/L (ref 98–111)
Creatinine, Ser: 1.09 mg/dL — ABNORMAL HIGH (ref 0.44–1.00)
GFR calc non Af Amer: 44 mL/min — ABNORMAL LOW (ref >60)
GFR, EST AFRICAN AMERICAN: 51 mL/min — AB (ref >60)
Glucose, Bld: 127 mg/dL — ABNORMAL HIGH (ref 70–99)
POTASSIUM: 4.5 mmol/L (ref 3.5–5.1)
SODIUM: 139 mmol/L (ref 135–145)

## 2017-09-25 LAB — URINALYSIS, ROUTINE W REFLEX MICROSCOPIC
BILIRUBIN URINE: NEGATIVE
Glucose, UA: NEGATIVE mg/dL
Hgb urine dipstick: NEGATIVE
KETONES UR: NEGATIVE mg/dL
LEUKOCYTES UA: NEGATIVE
NITRITE: NEGATIVE
PROTEIN: 30 mg/dL — AB
Specific Gravity, Urine: 1.019 (ref 1.005–1.030)
pH: 5 (ref 5.0–8.0)

## 2017-09-25 LAB — CBC
HCT: 38.7 % (ref 36.0–46.0)
HEMOGLOBIN: 12.4 g/dL (ref 12.0–15.0)
MCH: 29.7 pg (ref 26.0–34.0)
MCHC: 32 g/dL (ref 30.0–36.0)
MCV: 92.6 fL (ref 78.0–100.0)
PLATELETS: 286 10*3/uL (ref 150–400)
RBC: 4.18 MIL/uL (ref 3.87–5.11)
RDW: 12.9 % (ref 11.5–15.5)
WBC: 17.8 10*3/uL — AB (ref 4.0–10.5)

## 2017-09-25 LAB — PROCALCITONIN

## 2017-09-25 MED ORDER — SODIUM CHLORIDE 0.9 % IV SOLN
INTRAVENOUS | Status: DC
  Administered 2017-09-25: 19:00:00 via INTRAVENOUS

## 2017-09-25 NOTE — Care Management Note (Addendum)
Case Management Note  Patient Details  Name: Megan Pitts MRN: [id number] Date of Birth: 09/04/1927  Subjective/Objective:            Patient from home alone, sustained shoulder dislocation.  Patient is in observation, would pay out of pocket for SNF and does not have the medical intensity to change to inpatient.        Action/Plan:  Spoke w patient at bedside. She states that she does live alone, her daughter lives 5 minutes from her, works during the day and has a knee injury. She would be able to check on her but not really be able to support her in her ADLs. Her other daughter lives in [place] and may be able to come up, but not sure of the duration.  Placed [facility] to [facility] for consideration to the [facility]. Awaiting to hear if patient accepted.  Patient [facility] will be by in the morning to review program with her.    Expected Discharge Date:  (unknown)               Expected Discharge Plan:  [place]  In-House Referral:     Discharge planning Services  CM Consult  Post Acute Care Choice:    Choice offered to:     DME Arranged:    DME Agency:     HH Arranged:    HH Agency:     Status of Service:  In process, will continue to follow  If discussed at Long Length of Stay Meetings, dates discussed:    Additional Comments:  Carles Collet, RN 09/25/2017, 1:52 PM

## 2017-09-25 NOTE — Evaluation (Signed)
Physical Therapy Evaluation Patient Details Name: Megan Pitts MRN: [id number] DOB: 04/17/1927 Today's Date: 09/25/2017   History of Present Illness  84 yo female presenting with right shoulder pain. Pt does not recall fall or how she attained injury. Pt sustained right proximal humerus fx and shoulder dislocation. NWB and immobilized. PMH including A-fib, aortic valve replacement, GERD, HTN, shingles, and vertigo.   Clinical Impression  Pt admitted with above diagnosis. Pt currently with functional limitations due to the deficits listed below (see PT Problem List). Pt with balance deficits with and without assistive device,multiple LOB to R with min A to correct during ambulation. Concerning that pt does not have memory of event that caused R shoulder fx and dislocation. She reports that she woke up in the night and was unable to ambulate which was not normal for her. She somehow got to her recliner and sometime during that time period noted that her shoulder hurt. Question if some kid of cognitive event occurred especially given current balance deficits. On eval, STM deficits noted but otherwise appropriate. Pt not safe to be home alone, SNF would be best case scenario for her from safety standpoint. Recommend HHPT if SNF is not an option for her.  Pt will benefit from skilled PT to increase their independence and safety with mobility to allow discharge to the venue listed below.       Follow Up Recommendations SNF    Equipment Recommendations  None recommended by PT    Recommendations for Other Services       Precautions / Restrictions Precautions Precautions: Shoulder;Fall Type of Shoulder Precautions: s/p right proximal humerus fx. NWBing and immobilized. Verbal order from Dr. Stann Mainland for out of sling during bathing and dressing. Ok for hand, wrist, and elbow exercises.  Shoulder Interventions: Shoulder sling/immobilizer;Off for dressing/bathing/exercises Precaution Booklet Issued:  No Required Braces or Orthoses: Sling Restrictions Weight Bearing Restrictions: Yes RUE Weight Bearing: Non weight bearing Other Position/Activity Restrictions: Per Dr. Victorino December. WFL for hand, wrist, and elbow. No shoulder ROM. [place] for out of sling during bathing/dressing      Mobility  Bed Mobility               General bed mobility comments: pt in chair  Transfers Overall transfer level: Needs assistance Equipment used: 1 person hand held assist Transfers: Sit to/from Stand Sit to Stand: Min assist         General transfer comment: pt unsteady with LOB each time she stood up, min A to prevent falling.   Ambulation/Gait Ambulation/Gait assistance: Min assist Gait Distance (Feet): 250 Feet Assistive device: 1 person hand held assist;None;Straight cane Gait Pattern/deviations: Step-through pattern;Staggering right Gait velocity: decreased Gait velocity interpretation: <1.8 ft/sec, indicate of risk for recurrent falls General Gait Details: pt began ambulation without AD and had multiple LOB to R with min A to correct. Tried SPC but this did not improve pt's balance. HHA given for last 150' of ambulation and this was safest method though pt still had several LOB to R  Stairs            Wheelchair Mobility    Modified Rankin (Stroke Patients Only)       Balance Overall balance assessment: Needs assistance Sitting-balance support: No upper extremity supported;Feet supported Sitting balance-Leahy Scale: Good     Standing balance support: Single extremity supported;During functional activity Standing balance-Leahy Scale: Poor Standing balance comment: unsafe in standing without support  Pertinent Vitals/Pain Pain Assessment: Faces Faces Pain Scale: Hurts little more Pain Location: Right shoulder Pain Descriptors / Indicators: Discomfort;Grimacing;Guarding Pain Intervention(s): Limited activity within patient's  tolerance;Monitored during session    [place] expects to be discharged to:: Private residence Living Arrangements: Alone Available Help at Discharge: Family;Available PRN/intermittently Type of Home: House Home Access: Level entry     Home Layout: One level Home Equipment: Walker - 2 wheels;Cane - single point;Shower seat Additional Comments: daughter just had TKA    Prior Function Level of Independence: Independent         Comments: ADLs, IADLs, and driving. Pt was driving her daughter to OP PT after her daughter's knee surgery. Pt reports she was working at [employer] until a month ago.     Hand Dominance   Dominant Hand: Right    Extremity/Trunk Assessment   Upper Extremity Assessment Upper Extremity Assessment: Defer to OT evaluation    Lower Extremity Assessment Lower Extremity Assessment: Generalized weakness    Cervical / Trunk Assessment Cervical / Trunk Assessment: Kyphotic  Communication   Communication: No difficulties  Cognition Arousal/Alertness: Awake/alert Behavior During Therapy: WFL for tasks assessed/performed Overall Cognitive Status: No family/caregiver present to determine baseline cognitive functioning                                 General Comments: pt cannot recall hurting her shoulder, only knows that sometime in the night it began to hurt. On speaking with her more, STM deficits noted.       General Comments      Exercises     Assessment/Plan    PT Assessment Patient needs continued PT services  PT Problem List Decreased strength;Decreased activity tolerance;Decreased balance;Decreased mobility;Decreased range of motion;Decreased cognition;Decreased safety awareness;Decreased knowledge of use of DME;Pain;Decreased coordination       PT Treatment Interventions DME instruction;Gait training;Functional mobility training;Therapeutic activities;Therapeutic exercise;Balance training;Patient/family  education;Neuromuscular re-education;Cognitive remediation    PT Goals (Current goals can be found in the Care Plan section)  Acute Rehab PT Goals Patient Stated Goal: go to home or rehab PT Goal Formulation: With patient Time For Goal Achievement: 10/09/17 Potential to Achieve Goals: Fair    Frequency Min 3X/week   Barriers to discharge Decreased caregiver support daughter just had TKA and is not driving yet    Co-evaluation               AM-PAC PT "6 Clicks" Daily Activity  Outcome Measure Difficulty turning over in bed (including adjusting bedclothes, sheets and blankets)?: Unable Difficulty moving from lying on back to sitting on the side of the bed? : Unable Difficulty sitting down on and standing up from a chair with arms (e.g., wheelchair, bedside commode, etc,.)?: Unable Help needed moving to and from a bed to chair (including a wheelchair)?: A Little Help needed walking in hospital room?: A Little Help needed climbing 3-5 steps with a railing? : A Little 6 Click Score: 12    End of Session   Activity Tolerance: Patient tolerated treatment well Patient left: in chair;with call bell/phone within reach;with chair alarm set Nurse Communication: Mobility status PT Visit Diagnosis: Unsteadiness on feet (R26.81);Pain;Muscle weakness (generalized) (M62.81) Pain - Right/Left: Right Pain - part of body: Shoulder    Time: 9163-8466 PT Time Calculation (min) (ACUTE ONLY): 29 min   Charges:   PT Evaluation $PT Eval Moderate Complexity: 1 Mod PT Treatments $Gait Training: 8-22 mins  Leighton Roach, [place]  Pager [phone] Office [place] 09/25/2017, 4:38 PM

## 2017-09-25 NOTE — Care Management Obs Status (Deleted)
[place] NOTIFICATION   Patient Details  Name: Megan Pitts MRN: [id number] Date of Birth: [birth year]   Medicare Observation Status Notification Given:  Yes    Carles Collet, RN 09/25/2017, 2:34 PM

## 2017-09-25 NOTE — Evaluation (Signed)
Occupational Therapy Evaluation Patient Details Name: Megan Pitts MRN: [id number] DOB: 1927-09-14 Today's Date: 09/25/2017    History of Present Illness 84 yo female presenting with right shoulder pain. Pt does not recall fall or how she attained injury. Pt sustained right proximal humerus fx and shoulder dislocation. NWB and immobilized. PMH including A-fib, aortic valve replacement, GERD, HTN, shingles, and vertigo.    Clinical Impression   PTA, pt living alone and was independent with ADLs, IADLs, driving, and recently stop working at [employer]. Pt currently requiring Max A for UB ADLs, Min A for LB ADLs, Min A for toileting, and Min A for functional mobility with single hand held A. Providing education and handout on shoulder precautions and compensatory techniques for ADLs. Pt participating in hand, wrist, and elbow exercises. Pt presenting with decreased functional use of RUE (domiannt hand), poor balance, and impulsive behavior. Pt reporting that her daughter recently had TKA and unable to assist as needed at home. Pt would benefit from further acute OT to facilitate safe dc. Recommend dc to SNF for ST-Rehab for further OT to optimize safety, independence with ADLs, and return to PLOF.      Follow Up Recommendations  SNF;Supervision/Assistance - 24 hour ; Would be a good candidate for [facility] if eligible.    Equipment Recommendations  Other (comment)(Defer to next venue)    Recommendations for Other Services PT consult     Precautions / Restrictions Precautions Precautions: Shoulder;Fall Type of Shoulder Precautions: s/p right proximal humerus fx. NWBing and immobilized. Verbal order from Dr. Stann Mainland for out of sling during bathing and dressing. Ok for hand, wrist, and elbow exercises.  Shoulder Interventions: Shoulder sling/immobilizer;Off for dressing/bathing/exercises Precaution Booklet Issued: Yes (comment) Precaution Comments: Provided handout including shoulder  information and hand, wrist, and elbow exercises. Reveiwed in full. Required Braces or Orthoses: Sling Restrictions Weight Bearing Restrictions: Yes RUE Weight Bearing: Non weight bearing Other Position/Activity Restrictions: Per Dr. Victorino December. WFL for hand, wrist, and elbow. No shoulder ROM. Ok for out of sling during bathing/dressing      Mobility Bed Mobility Overal bed mobility: Needs Assistance Bed Mobility: Supine to Sit     Supine to sit: Min assist;HOB elevated     General bed mobility comments: Min A to stabilize trunk  Transfers Overall transfer level: Needs assistance Equipment used: 1 person hand held assist Transfers: Sit to/from Stand Sit to Stand: Min assist         General transfer comment: Pt powering up into standing well. However, presenting with decreased balance and required Min A for standing balance.     Balance Overall balance assessment: Needs assistance Sitting-balance support: No upper extremity supported;Feet supported Sitting balance-Leahy Scale: Good Sitting balance - Comments: Able to don socks at EOB demonstrating dynamic sitting balance   Standing balance support: Single extremity supported;During functional activity Standing balance-Leahy Scale: Poor Standing balance comment: Able to maintain static standing balance. Requiring physical A for walking. Also highly impulsive                           ADL either performed or assessed with clinical judgement   ADL Overall ADL's : Needs assistance/impaired Eating/Feeding: Minimal assistance;Sitting Eating/Feeding Details (indicate cue type and reason): Min A for bilateral tasks Grooming: Minimal assistance;Standing;Wash/dry hands Grooming Details (indicate cue type and reason): Min A for standing balance at sink Upper Body Bathing: Maximal assistance;Sitting   Lower Body Bathing: Minimal assistance;Sit to/from stand  Upper Body Dressing : Maximal assistance;Sitting    Lower Body Dressing: Minimal assistance;Sit to/from stand Lower Body Dressing Details (indicate cue type and reason): Min A for donning socks with one-handed technique. Min A  Toilet Transfer: Minimal assistance;Regular Toilet;Grab bars;Ambulation Toilet Transfer Details (indicate cue type and reason): Min A for balance and safety Toileting- Clothing Manipulation and Hygiene: Minimal assistance;Sit to/from stand Toileting - Clothing Manipulation Details (indicate cue type and reason): Min A for managing underwear during toileting     Functional mobility during ADLs: Minimal assistance General ADL Comments: Pt presenting with decreased balance and poor functional use of RUE (dominant hand) with immobilization. Pt also impulsive and requires increased cues for safety.      Vision         Perception     Praxis      Pertinent Vitals/Pain Pain Assessment: Faces Faces Pain Scale: Hurts little more Pain Location: Right shoulder Pain Descriptors / Indicators: Discomfort;Grimacing;Guarding Pain Intervention(s): Monitored during session;Limited activity within patient's tolerance;Repositioned     Hand Dominance Right   Extremity/Trunk Assessment Upper Extremity Assessment Upper Extremity Assessment: RUE deficits/detail RUE Deficits / Details: s/p right proximal humerus fx. WFL for hand, wrist, and elbow ROM. No shoulder ROM.  RUE Coordination: decreased gross motor   Lower Extremity Assessment Lower Extremity Assessment: Generalized weakness   Cervical / Trunk Assessment Cervical / Trunk Assessment: Normal   Communication Communication Communication: No difficulties   Cognition Arousal/Alertness: Awake/alert Behavior During Therapy: WFL for tasks assessed/performed Overall Cognitive Status: Within Functional Limits for tasks assessed                                 General Comments: Pt slightly impulsive and requires cues to slow down. Feel this is close to her  baseline   General Comments  Discussed dc options. Pt stating she is agreeable to SNF for ST rehab but would like some more information    Exercises Exercises: Shoulder Shoulder Exercises Elbow Flexion: AROM;Right;10 reps;Seated Elbow Extension: AROM;Right;10 reps;Seated Wrist Flexion: AROM;Right;10 reps;Seated Wrist Extension: AROM;Right;10 reps;Seated Digit Composite Flexion: AROM;Right;10 reps;Seated Composite Extension: AROM;Right;10 reps;Seated Neck Flexion: AROM;5 reps;Seated Neck Extension: 5 reps;Seated;AROM Neck Lateral Flexion - Right: AROM;5 reps;Seated Neck Lateral Flexion - Left: AROM;5 reps;Seated   Shoulder Instructions Shoulder Instructions Donning/doffing shirt without moving shoulder: Maximal assistance Method for sponge bathing under operated UE: Maximal assistance Donning/doffing sling/immobilizer: Maximal assistance Correct positioning of sling/immobilizer: Maximal assistance ROM for elbow, wrist and digits of operated UE: Supervision/safety Sling wearing schedule (on at all times/off for ADL's): Supervision/safety Proper positioning of operated UE when showering: Supervision/safety Positioning of UE while sleeping: Minimal assistance    Home Living Family/patient expects to be discharged to:: Private residence Living Arrangements: Alone Available Help at Discharge: Family;Available PRN/intermittently Type of Home: House Home Access: Level entry     Home Layout: One level     Bathroom Shower/Tub: Teacher, early years/pre: Standard     Home Equipment: Environmental consultant - 2 wheels;Cane - single point;Shower seat          Prior Functioning/Environment Level of Independence: Independent        Comments: ADLs, IADLs, and driving. Pt was driving her daughter to OP PT after her daughter's knee surgery. Pt reports she was working at [employer] until a month ago.        OT Problem List: Decreased strength;Decreased range of motion;Decreased  activity tolerance;Impaired balance (sitting and/or  standing);Decreased knowledge of use of DME or AE;Decreased safety awareness;Decreased knowledge of precautions;Pain;Impaired UE functional use      OT Treatment/Interventions: Self-care/ADL training;Therapeutic exercise;Energy conservation;DME and/or AE instruction;Therapeutic activities;Patient/family education    OT Goals(Current goals can be found in the care plan section) Acute Rehab OT Goals Patient Stated Goal: Go home  OT Goal Formulation: With patient Time For Goal Achievement: 10/09/17 Potential to Achieve Goals: Good  OT Frequency: Min 2X/week   Barriers to D/C: Decreased caregiver support  Daughter who lives nearby jsut had TKA and pt reports she is unable to assist.       Co-evaluation              AM-PAC PT "6 Clicks" Daily Activity     Outcome Measure Help from another person eating meals?: A Little Help from another person taking care of personal grooming?: A Little Help from another person toileting, which includes using toliet, bedpan, or urinal?: A Little Help from another person bathing (including washing, rinsing, drying)?: A Lot Help from another person to put on and taking off regular upper body clothing?: A Lot Help from another person to put on and taking off regular lower body clothing?: A Little 6 Click Score: 16   End of Session Equipment Utilized During Treatment: Other (comment)(Sling) Nurse Communication: Mobility status;Precautions;Weight bearing status  Activity Tolerance: Patient tolerated treatment well;Patient limited by pain Patient left: in chair;with call bell/phone within reach;with chair alarm set  OT Visit Diagnosis: Unsteadiness on feet (R26.81);Other abnormalities of gait and mobility (R26.89);Muscle weakness (generalized) (M62.81);Pain Pain - Right/Left: Right Pain - part of body: Shoulder                Time: 1220-1250 OT Time Calculation (min): 30 min Charges:  OT General  Charges $OT Visit: 1 Visit OT Evaluation $OT Eval Moderate Complexity: 1 Mod OT Treatments $Self Care/Home Management : 8-22 mins  Tamantha Saline MSOT, OTR/L Acute Rehab Pager: [phone] Office: [place] 09/25/2017, 3:12 PM

## 2017-09-25 NOTE — Progress Notes (Signed)
I was asked to evaluate the patient and consider the clinical course by my partner Dr. Lyla Glassing.  They had a somewhat difficult time reducing her traumatic right shoulder dislocation.  However, post reduction maneuver CT scan does demonstrate a concentrically reduced right glenohumeral joint with minimal displacement and in fact caudal displacement of the greater tuberosity fragment.  I have reviewed his images at length and also examined the patient has a normal neurologic status at this time.  Recommendation be to move forward with conservative management of his traumatic shoulder dislocation.  She will be nonweightbearing and should wear her sling at all times other than when doing ADLs or elbow, hand, wrist exercises.  She may have active and passive range of motion at the elbow, hand, and wrist as tolerated.  I will see her back in my office in 2 weeks with new x-rays of the right shoulder.   Otherwise, at this time no operative interventions planned.  She may have a diet and may continue on with discharge planning.

## 2017-09-25 NOTE — Progress Notes (Signed)
PROGRESS NOTE    Megan Pitts  BTD:[id number] DOB: 15-Feb-1927 DOA: 09/24/2017 PCP: Marton Redwood, MD    Brief Narrative: Megan Pitts is a 84 y.o. female with past medical history significant for hypertension, hyperlipidemia, diastolic CHF, aortic stenosis status post aortic valve replacement who presents from home with complaints of pain on her right shoulder. She reports she couldn't remember how she ended up with a dislocation. All she remembers is waking around 2 am, going to the restroom and sit in a recliner and unable to move her legs . She is not a great historian and detailed history is not available as she lives by herself. She appears to have a h/o dementia and initial work up to check stroke is negative with a negative CT head. Family were concerned about the events and her unable to recall events and unable to move.  We will get a  MRI brain without contrast to rule out stroke.      Assessment & Plan:   Principal Problem:   Fracture dislocation of right shoulder joint Active Problems:   Hypertension   Hyperlipidemia   S/P AVR   Chronic diastolic CHF (congestive heart failure) (HCC)   Leucocytosis   Dislocation of the right shoulder : Reduced by orthopedics . Pain control and NWB as recommended by ortho.  PT eval recommending SNF.    CONFUSION about the events happened overnight: Initial CT head negative for stroke.  Will follow up with a MRI brain without contrast.    Hypertension; well controlled.    Chronic diastolic heart failure: She appears dehydrated, gentle hydration for 24 hours and repeat bmp in am.    Leukocytosis: Unclear etiology.  CXR , UA is negative for infection. She denies any nausea, vomiting or abdominal pain or diarrhea.  ? Reactive, get pro calcitonin level.    Mild renal insufficiency Suspect from dehydration .  Gently hydrate for 24 hours and recheck renal parameters in am.    H/o AVR.  Resume aspirin.    Dementia:  Resume  aricept.    Hyperlipidemia: resume Crestor.   DVT prophylaxis:  Lovenox.  Code Status:  Full code.  Family Communication: discussed with grand daughter over the phone.  Disposition Plan:pending MRI brain.    Consultants:   Orthopedics. Dr Stann Mainland.    Procedures: reduction of the right shoulder dislocation.    Antimicrobials: none.    Subjective: Pt reports painful ROM of the right arm. No chest pain or sob.   Objective: Vitals:   09/24/17 2015 09/24/17 2115 09/24/17 2216 09/25/17 0207  BP: (!) 159/56 (!) 172/57 (!) 150/40 (!) 154/46  Pulse: 78 79 65 67  Resp: 17   17  Temp:  98.5 F (36.9 C)  98.4 F (36.9 C)  TempSrc:  Oral  Oral  SpO2: 96% 95%  95%  Weight:        Intake/Output Summary (Last 24 hours) at 09/25/2017 1657 Last data filed at 09/25/2017 0500 Gross per 24 hour  Intake 1000 ml  Output 300 ml  Net 700 ml   Filed Weights   09/24/17 1638  Weight: 70.1 kg    Examination:  General exam: Appears calm and comfortable  Respiratory system: Clear to auscultation. Respiratory effort normal. Cardiovascular system: S1 & S2 heard, RRR. No JVD,  No pedal edema. Gastrointestinal system: Abdomen is nondistended, soft and nontender. Normal bowel sounds heard. Central nervous system: Alert and oriented. Painful ROM of the right shoulder. Grossly non focal. Extremities:  Right arm and shoulder in a sling.  Skin: No rashes, lesions or ulcers Psychiatry:Mood & affect appropriate.     Data Reviewed: I have personally reviewed following labs and imaging studies  CBC: Recent Labs  Lab 09/24/17 1257 09/25/17 1514  WBC 20.6* 17.8*  NEUTROABS 18.8*  --   HGB 13.9 12.4  HCT 40.4 38.7  MCV 88.2 92.6  PLT 403* 176   Basic Metabolic Panel: Recent Labs  Lab 09/24/17 1257 09/25/17 1514  NA 140 139  K 3.6 4.5  CL 103 105  CO2 25 20*  GLUCOSE 128* 127*  BUN 20 24*  CREATININE 0.89 1.09*  CALCIUM 10.0 9.6   GFR: CrCl cannot be calculated (Unknown  ideal weight.). Liver Function Tests: Recent Labs  Lab 09/24/17 1257  AST 27  ALT 18  ALKPHOS 54  BILITOT 1.5*  PROT 7.9  ALBUMIN 4.6   No results for input(s): LIPASE, AMYLASE in the last 168 hours. No results for input(s): AMMONIA in the last 168 hours. Coagulation Profile: Recent Labs  Lab 09/24/17 1257  INR 0.98   Cardiac Enzymes: No results for input(s): CKTOTAL, CKMB, CKMBINDEX, TROPONINI in the last 168 hours. BNP (last 3 results) No results for input(s): PROBNP in the last 8760 hours. HbA1C: No results for input(s): HGBA1C in the last 72 hours. CBG: No results for input(s): GLUCAP in the last 168 hours. Lipid Profile: No results for input(s): CHOL, HDL, LDLCALC, TRIG, CHOLHDL, LDLDIRECT in the last 72 hours. Thyroid Function Tests: No results for input(s): TSH, T4TOTAL, FREET4, T3FREE, THYROIDAB in the last 72 hours. Anemia Panel: No results for input(s): VITAMINB12, FOLATE, FERRITIN, TIBC, IRON, RETICCTPCT in the last 72 hours. Sepsis Labs: No results for input(s): PROCALCITON, LATICACIDVEN in the last 168 hours.  No results found for this or any previous visit (from the past 240 hour(s)).       Radiology Studies: Dg Chest 1 View  Result Date: 09/24/2017 CLINICAL DATA:  Severe right arm pain. EXAM: CHEST  1 VIEW COMPARISON:  08/04/2005 FINDINGS: There is a fracture dislocation at the right shoulder. There is a displaced fragment of the humeral head. Humeral head is anteriorly and inferiorly displaced. Heart size and pulmonary vascularity are normal. Lungs are clear. Aortic atherosclerosis. IMPRESSION: Fracture dislocation at the right glenohumeral joint as described. Aortic Atherosclerosis (ICD10-I70.0). Electronically Signed   By: Lorriane Shire M.D.   On: 09/24/2017 13:44   Dg Shoulder 1 View Right  Result Date: 09/24/2017 CLINICAL DATA:  Second reduction attempt of the right shoulder. EXAM: RIGHT SHOULDER - 1 VIEW COMPARISON:  Right shoulder radiograph  09/24/2017 at 4:01. FINDINGS: The right humerus remains dislocated anteriorly and inferiorly. A fracture fragment from the right humerus projects lateral to the glenoid. Lung volumes are low. IMPRESSION: 1. Persistent anterior inferior right shoulder dislocation. 2. Stable fracture. Electronically Signed   By: San Morelle M.D.   On: 09/24/2017 17:12   Dg Shoulder 1 View Right  Result Date: 09/24/2017 CLINICAL DATA:  Fracture dislocation at the right shoulder. Attempted reduction. EXAM: RIGHT SHOULDER - 1 VIEW 4:01 p.m. COMPARISON:  Radiographs dated 09/24/2017 at 1:20 p.m. FINDINGS: There has been no change in the anterior dislocation and fracture as described on the prior study. IMPRESSION: Unsuccessful attempted reduction of proximal right humeral anterior dislocation. Displaced fracture of the posterolateral aspect of the right humeral head. Electronically Signed   By: Lorriane Shire M.D.   On: 09/24/2017 16:25   Dg Shoulder Right  Result Date: 09/24/2017  CLINICAL DATA:  Sudden onset of right arm pain this morning. EXAM: RIGHT SHOULDER - 2+ VIEW COMPARISON:  None. FINDINGS: There is anterior inferior dislocation of the right glenohumeral joint. There is a comminuted fracture of the proximal right humerus with a displaced fracture involving the greater tuberosity. There is a possible nondisplaced fracture traversing the humeral neck as well, less conclusively demonstrated. No definite scapular fracture. Previous median sternotomy noted. IMPRESSION: Anteriorly dislocated right humeral head with displaced avulsion fracture of the greater tuberosity. Extension of the fracture across the right humeral neck is difficult to exclude. Electronically Signed   By: Richardean Sale M.D.   On: 09/24/2017 13:34   Ct Head Wo Contrast  Result Date: 09/24/2017 CLINICAL DATA:  Recent fall EXAM: CT HEAD WITHOUT CONTRAST CT CERVICAL SPINE WITHOUT CONTRAST TECHNIQUE: Multidetector CT imaging of the head and  cervical spine was performed following the standard protocol without intravenous contrast. Multiplanar CT image reconstructions of the cervical spine were also generated. COMPARISON:  None. FINDINGS: CT HEAD FINDINGS Brain: Mild atrophic changes are identified. No findings to suggest acute hemorrhage, acute infarction or space-occupying mass lesion are noted. Vascular: No hyperdense vessel or unexpected calcification. Skull: Normal. Negative for fracture or focal lesion. Sinuses/Orbits: No acute finding. Other: None. CT CERVICAL SPINE FINDINGS Alignment: Within normal limits. Skull base and vertebrae: 7 cervical segments are well visualized. Vertebral body height is well maintained. Disc space narrowing is noted at C5-6 and C6-7. Osteophytic changes are noted. Mild facet hypertrophic changes are noted. No acute fracture or acute facet abnormality is noted. Soft tissues and spinal canal: Surrounding soft tissues demonstrate a 16 mm hypodensity within the left lobe of the thyroid. No soft tissue mass lesion is seen. No other focal abnormality is noted. Upper chest: Within normal limits. Other: No acute abnormality noted. IMPRESSION: CT of the head: Mild atrophic changes without acute abnormality. CT of the cervical spine: Mild degenerative changes without acute abnormality. Electronically Signed   By: Inez Catalina M.D.   On: 09/24/2017 14:18   Ct Cervical Spine Wo Contrast  Result Date: 09/24/2017 CLINICAL DATA:  Recent fall EXAM: CT HEAD WITHOUT CONTRAST CT CERVICAL SPINE WITHOUT CONTRAST TECHNIQUE: Multidetector CT imaging of the head and cervical spine was performed following the standard protocol without intravenous contrast. Multiplanar CT image reconstructions of the cervical spine were also generated. COMPARISON:  None. FINDINGS: CT HEAD FINDINGS Brain: Mild atrophic changes are identified. No findings to suggest acute hemorrhage, acute infarction or space-occupying mass lesion are noted. Vascular: No  hyperdense vessel or unexpected calcification. Skull: Normal. Negative for fracture or focal lesion. Sinuses/Orbits: No acute finding. Other: None. CT CERVICAL SPINE FINDINGS Alignment: Within normal limits. Skull base and vertebrae: 7 cervical segments are well visualized. Vertebral body height is well maintained. Disc space narrowing is noted at C5-6 and C6-7. Osteophytic changes are noted. Mild facet hypertrophic changes are noted. No acute fracture or acute facet abnormality is noted. Soft tissues and spinal canal: Surrounding soft tissues demonstrate a 16 mm hypodensity within the left lobe of the thyroid. No soft tissue mass lesion is seen. No other focal abnormality is noted. Upper chest: Within normal limits. Other: No acute abnormality noted. IMPRESSION: CT of the head: Mild atrophic changes without acute abnormality. CT of the cervical spine: Mild degenerative changes without acute abnormality. Electronically Signed   By: Inez Catalina M.D.   On: 09/24/2017 14:18   Ct Shoulder Right Wo Contrast  Result Date: 09/24/2017 CLINICAL DATA:  Right shoulder  fracture post reduction. EXAM: CT OF THE UPPER RIGHT EXTREMITY WITHOUT CONTRAST TECHNIQUE: Multidetector CT imaging of the upper right extremity was performed according to the standard protocol. COMPARISON:  Pre and post reduction radiographs of the right shoulder from earlier on the same day. FINDINGS: Bones/Joint/Cartilage Acute comminuted fracture of the humeral head involving the greater tuberosity with slight caudal displacement of the fracture fragment by 10 mm. Tiny intra-articular fracture fragments noted posteriorly measuring 2 and 3 mm in length, series 5/37 and 39. Mild irregularity of the anterior inferior glenoid rim is in keeping with and osseous Bankart lesion, series 5/48. Degenerative disc disease of the included lower cervical spine from C5 through C7 with uncovertebral joint osteoarthritis and uncinate spurring bilaterally. Intact  sternoclavicular and acromioclavicular joints. Intact included right upper ribs. Intact scapula and clavicle. Ligaments Suboptimally assessed by CT. Muscles and Tendons No intramuscular hemorrhage or atrophy. Rotator cuff tendons are suboptimally visualized on CT. Soft tissues Soft tissue swelling and edema along the proximal arm without focal fluid collection or hematoma. IMPRESSION: 1. Satisfactory reduction of previously dislocated humeral head with bony Bankart lesion along the anterior inferior glenoid rim. Comminuted greater tuberosity fracture is less displaced than on prior radiographs though still approximately 1 cm caudad from its normal position. 2. No additional fractures. Soft tissue swelling about the right shoulder. Electronically Signed   By: Ashley Royalty M.D.   On: 09/24/2017 20:04   Dg Shoulder Right Portable  Result Date: 09/24/2017 CLINICAL DATA:  Post reduction EXAM: PORTABLE RIGHT SHOULDER COMPARISON:  09/24/2017 FINDINGS: Glenohumeral alignment within normal limits on single frontal view. Slight decreased displacement comminuted greater tuberosity fracture fragment. The [facility] joint is intact. IMPRESSION: Slight decreased displacement of comminuted greater tuberosity fracture fragment. Glenohumeral alignment within normal limits on single frontal view. Electronically Signed   By: Donavan Foil M.D.   On: 09/24/2017 18:05   Dg Shoulder Right Portable  Result Date: 09/24/2017 CLINICAL DATA:  Post reduction attempt EXAM: PORTABLE RIGHT SHOULDER COMPARISON:  09/24/2017 FINDINGS: Acute mildly comminuted and displaced fracture involving the greater tuberosity of the humerus. Improved alignment of right humeral head on frontal view but with persistent anterior subluxation on the Y-view. IMPRESSION: 1. Residual anterior displacement of the humeral head on the Y-view 2. Acute displaced and slightly comminuted fracture involving the greater tuberosity Electronically Signed   By: Donavan Foil M.D.    On: 09/24/2017 18:03   Dg Humerus Right  Result Date: 09/24/2017 CLINICAL DATA:  Right shoulder pain. EXAM: RIGHT HUMERUS - 2+ VIEW COMPARISON:  None. FINDINGS: There is a fracture dislocation at the glenohumeral joint. The humeral head is anteriorly dislocated. There is a displaced fragment of the posterior aspect of the humeral head. The remainder of the humerus is intact. IMPRESSION: Fracture dislocation at the glenohumeral joint as described. Electronically Signed   By: Lorriane Shire M.D.   On: 09/24/2017 13:45        Scheduled Meds: . amLODipine  10 mg Oral Daily  . aspirin EC  81 mg Oral Daily  . donepezil  10 mg Oral QHS  . enoxaparin (LOVENOX) injection  30 mg Subcutaneous Q24H  . irbesartan  300 mg Oral Daily  . oxybutynin  10 mg Oral QHS  . propofol  0.5 mg/kg Intravenous Once  . propofol  0.5 mg/kg Intravenous Once  . rosuvastatin  20 mg Oral Daily   Continuous Infusions: . sodium chloride       LOS: 0 days    Time spent:  35 minutes.     Hosie Poisson, MD [facility] Pager [phone] If 7PM-7AM, please contact night-coverage [email or web address] Password [facility] 09/25/2017, 4:57 PM

## 2017-09-25 NOTE — Care Management Obs Status (Signed)
[place] NOTIFICATION   Patient Details  Name: Megan Pitts MRN: [id number] Date of Birth: [birth year]   Medicare Observation Status Notification Given:       Carles Collet, RN 09/25/2017, 2:34 PM

## 2017-09-26 ENCOUNTER — Inpatient Hospital Stay (HOSPITAL_COMMUNITY): Payer: Medicare Other

## 2017-09-26 DIAGNOSIS — R488 Other symbolic dysfunctions: Secondary | ICD-10-CM | POA: Diagnosis not present

## 2017-09-26 DIAGNOSIS — M25511 Pain in right shoulder: Secondary | ICD-10-CM | POA: Diagnosis not present

## 2017-09-26 DIAGNOSIS — E785 Hyperlipidemia, unspecified: Secondary | ICD-10-CM | POA: Diagnosis present

## 2017-09-26 DIAGNOSIS — Z90710 Acquired absence of both cervix and uterus: Secondary | ICD-10-CM | POA: Diagnosis not present

## 2017-09-26 DIAGNOSIS — K219 Gastro-esophageal reflux disease without esophagitis: Secondary | ICD-10-CM | POA: Diagnosis present

## 2017-09-26 DIAGNOSIS — Z953 Presence of xenogenic heart valve: Secondary | ICD-10-CM | POA: Diagnosis not present

## 2017-09-26 DIAGNOSIS — F0390 Unspecified dementia without behavioral disturbance: Secondary | ICD-10-CM | POA: Diagnosis present

## 2017-09-26 DIAGNOSIS — R278 Other lack of coordination: Secondary | ICD-10-CM | POA: Diagnosis not present

## 2017-09-26 DIAGNOSIS — R5381 Other malaise: Secondary | ICD-10-CM | POA: Diagnosis present

## 2017-09-26 DIAGNOSIS — S40011A Contusion of right shoulder, initial encounter: Secondary | ICD-10-CM | POA: Diagnosis present

## 2017-09-26 DIAGNOSIS — N289 Disorder of kidney and ureter, unspecified: Secondary | ICD-10-CM | POA: Diagnosis present

## 2017-09-26 DIAGNOSIS — R2681 Unsteadiness on feet: Secondary | ICD-10-CM | POA: Diagnosis not present

## 2017-09-26 DIAGNOSIS — R55 Syncope and collapse: Secondary | ICD-10-CM

## 2017-09-26 DIAGNOSIS — S43014A Anterior dislocation of right humerus, initial encounter: Secondary | ICD-10-CM | POA: Diagnosis present

## 2017-09-26 DIAGNOSIS — Z8619 Personal history of other infectious and parasitic diseases: Secondary | ICD-10-CM | POA: Diagnosis not present

## 2017-09-26 DIAGNOSIS — Z8672 Personal history of thrombophlebitis: Secondary | ICD-10-CM | POA: Diagnosis not present

## 2017-09-26 DIAGNOSIS — I110 Hypertensive heart disease with heart failure: Secondary | ICD-10-CM | POA: Diagnosis present

## 2017-09-26 DIAGNOSIS — N3281 Overactive bladder: Secondary | ICD-10-CM | POA: Diagnosis present

## 2017-09-26 DIAGNOSIS — W19XXXA Unspecified fall, initial encounter: Secondary | ICD-10-CM | POA: Diagnosis present

## 2017-09-26 DIAGNOSIS — R2689 Other abnormalities of gait and mobility: Secondary | ICD-10-CM | POA: Diagnosis not present

## 2017-09-26 DIAGNOSIS — S42251A Displaced fracture of greater tuberosity of right humerus, initial encounter for closed fracture: Secondary | ICD-10-CM | POA: Diagnosis present

## 2017-09-26 DIAGNOSIS — I5032 Chronic diastolic (congestive) heart failure: Secondary | ICD-10-CM | POA: Diagnosis present

## 2017-09-26 DIAGNOSIS — E7800 Pure hypercholesterolemia, unspecified: Secondary | ICD-10-CM | POA: Diagnosis not present

## 2017-09-26 DIAGNOSIS — Z888 Allergy status to other drugs, medicaments and biological substances status: Secondary | ICD-10-CM | POA: Diagnosis not present

## 2017-09-26 DIAGNOSIS — S42214A Unspecified nondisplaced fracture of surgical neck of right humerus, initial encounter for closed fracture: Secondary | ICD-10-CM | POA: Diagnosis present

## 2017-09-26 DIAGNOSIS — E860 Dehydration: Secondary | ICD-10-CM | POA: Diagnosis present

## 2017-09-26 DIAGNOSIS — M6281 Muscle weakness (generalized): Secondary | ICD-10-CM | POA: Diagnosis not present

## 2017-09-26 DIAGNOSIS — I4891 Unspecified atrial fibrillation: Secondary | ICD-10-CM | POA: Diagnosis present

## 2017-09-26 DIAGNOSIS — Z8673 Personal history of transient ischemic attack (TIA), and cerebral infarction without residual deficits: Secondary | ICD-10-CM | POA: Diagnosis not present

## 2017-09-26 DIAGNOSIS — S4291XD Fracture of right shoulder girdle, part unspecified, subsequent encounter for fracture with routine healing: Secondary | ICD-10-CM | POA: Diagnosis not present

## 2017-09-26 DIAGNOSIS — D72829 Elevated white blood cell count, unspecified: Secondary | ICD-10-CM | POA: Diagnosis present

## 2017-09-26 DIAGNOSIS — Z7982 Long term (current) use of aspirin: Secondary | ICD-10-CM | POA: Diagnosis not present

## 2017-09-26 DIAGNOSIS — Z952 Presence of prosthetic heart valve: Secondary | ICD-10-CM | POA: Diagnosis not present

## 2017-09-26 LAB — PROCALCITONIN: Procalcitonin: <0.1 ng/mL

## 2017-09-26 NOTE — Care Management Note (Addendum)
Case Management Note  Patient Details  Name: [name] MRN: [id number] Date of Birth: [birth year]  Subjective/Objective:   Fracture dislocation of right shoulder joint, HTN, s/p AVR, CHF                 Action/Plan: NCM spoke to pt an daughter, Megan Pitts # [phone] at bedside. Pt states she lives at home alone and has not been able to walk. States she was able to get up and move around with her RW. Dtr states she recently had knee surgery and is limited in the amount of assistance at home. Pt and dtr is requesting SNF. NCM contacted attending. Dtr requested to speak to attending. Pt does qualify for [place] program with aide that can be available 24 hours for the first several days but pt will still need family support in the home. Will continue to follow for dc needs. Pt states she cannot pay for rehab out of pocket.   Pt does not qualify for SNF due to lacking 3 night qualifying IP stay as per Medicare guidelines.    Expected Discharge Date:               Expected Discharge Plan:  [place]  In-House Referral:  Clinical Social Work  Discharge planning Services  CM Consult  Post Acute Care Choice:  Home Health Choice offered to:  Adult Children  DME Arranged:  N/A DME Agency:  NA  HH Arranged:  RN, PT, Social Work, Nurse's Aide [place] Agency:  [facility]  Status of Service:  Completed, signed off  If discussed at [employer], dates discussed:    Additional Comments:  Erenest Rasher, RN 09/26/2017, 6:12 PM

## 2017-09-26 NOTE — Progress Notes (Signed)
CSW received consult for SNF placement. Pt does not meet medicare guidelines for SNF placement due to observation status. CSW signing off and notes that RNCM has set pt up with home first program.   [place], [place] [phone]

## 2017-09-26 NOTE — Progress Notes (Addendum)
PROGRESS NOTE    Patient: Megan Pitts                            PCP: Marton Redwood, MD                    DOB: 1927/04/20            DOA: 09/24/2017 EXB:[id number]             DOS: 09/26/2017, 1:11 PM   LOS: 0 days   Date of Service: The patient was seen and examined on 09/26/2017  Subjective:   Patient was seen and examined this morning, stable, we can alert.  No signs of confusion. No issues overnight.  Right shoulder in the sling, reporting unable to have a good use of her right arm.  Reports that she lives alone, scared on comfortable going home as she has limited use of her right arm.  She is right-handed.   Brief Narrative:   SHAUNAE SIELOFF a 84 y.o.femalewithpastmedical history significantforhypertension, hyperlipidemia, diastolic CHF, aortic stenosis status post aortic valve replacement who presents from home with complaints of pain on her right shoulder. She reports she couldn't remember how she ended up with a dislocation. All she remembers is waking around 2 am, going to the restroom and sit in a recliner and unable to move her legs . She is not a great historian and detailed history is not available as she lives by herself. She appears to have a h/o dementia and initial work up to check stroke is negative with a negative CT head. Family were concerned about the events and her unable to recall events and unable to move.  MRI of the brain was obtained, revealing no acute CVA, or cerebellar infarct was identified.   Principal Problem:   Fracture dislocation of right shoulder joint Active Problems:   Hypertension   Hyperlipidemia   S/P AVR   Chronic diastolic CHF (congestive heart failure) (HCC)   Leucocytosis    Assessment & Plan:    Fracture dislocation of right shoulder joint Active Problems:   Hypertension   Hyperlipidemia   S/P AVR   Chronic diastolic CHF (congestive heart failure) (HCC)   Leucocytosis   Dislocation of the right shoulder : -Possibly  secondary to fall,  syncope Status post close reduction by orthopedic team,  -Currently in a sling immobilizer, complaining of pain limited range of motion -She is right-handed -Continue pain management.  PT eval recommending SNF.    Confusion/possible syncope/fall  -Due to patient's poor memory unable to obtain detailed -Resolved, CT of the head was negative, MRI of the brain was reviewed in detail, reporting old cerebellar infarct negative for any acute infarct -Patient's mentation was at baseline this morning, was able to communicate and follow commands well. -We will continue with neurochecks, -We will obtain a bilateral carotid studies -2D echocardiogram -Continue monitor the patient on a monitored bed for possible dysrhythmia (heart rates varies 56-79) -    Hypertension; -stable at this time,  -We will check for orthostatic blood pressures   Chronic diastolic heart failure: -Highly dehydrated, status post 24-hour gentle IV fluid hydration -Daily weight -Denies any shortness of breath or chest pain   Leukocytosis: -Likely reactive, no signs of infection -Chest x-ray UA negative -No signs of abdominal pain diarrhea or constipation -No antibiotics at this time   Mild renal insufficiency Suspect from dehydration .  Gently hydrate for  24 hours and recheck renal parameters in am.    H/o AVR.  Resume aspirin.    Dementia:  Resume aricept.    Hyperlipidemia: resume Crestor.   DVT prophylaxis:  Lovenox.  Code Status:  Full code.  Family Communication:  Patient family not bedside, planning to call granddaughter Disposition Plan:pending MRI brain.   Due to patient's multiple comorbidities: Continue to be declining, status post shoulder dislocation possibly due to fall, and syncope, comorbidities of dementia, hypertension, hyperlipidemia, systolic congestive father, aortic stenosis... The patient severe debility, status post PT evaluation I am  recommending for patient to be switched to inpatient, to determine the possible cause of her syncope, reduce her risk of fall precautions. Recommending her for skilled nursing facility at discharge.    Procedures:     This post close reduction of the right shoulder  Antimicrobials:  Anti-infectives (From admission, onward)   None       Medication:  . amLODipine  10 mg Oral Daily  . aspirin EC  81 mg Oral Daily  . donepezil  10 mg Oral QHS  . enoxaparin (LOVENOX) injection  30 mg Subcutaneous Q24H  . irbesartan  300 mg Oral Daily  . oxybutynin  10 mg Oral QHS  . propofol  0.5 mg/kg Intravenous Once  . propofol  0.5 mg/kg Intravenous Once  . rosuvastatin  20 mg Oral Daily    morphine injection     Objective:   Vitals:   09/25/17 2020 09/25/17 2022 09/26/17 0452 09/26/17 1027  BP: (!) 131/50 (!) 136/56 (!) 128/46 (!) 124/46  Pulse: 77 85 (!) 56   Resp: 19 18 19    Temp:  98.6 F (37 C) 98.2 F (36.8 C)   TempSrc:  Oral Oral   SpO2: 98% 98% 93%   Weight:        Intake/Output Summary (Last 24 hours) at 09/26/2017 1311 Last data filed at 09/26/2017 0830 Gross per 24 hour  Intake 360 ml  Output -  Net 360 ml   Filed Weights   09/24/17 1638  Weight: 70.1 kg     Examination:    General exam: Appears calm and comfortable  BP (!) 124/46   Pulse (!) 56   Temp 98.2 F (36.8 C) (Oral)   Resp 19   Wt 70.1 kg   SpO2 93%   BMI 27.82 kg/m    Physical Exam  Constitution:  Alert, cooperative, no distress,  Psychiatric: Normal and stable mood and affect, cognition intact,   HEENT: Normocephalic, PERRL, otherwise with in Normal limits  Chest:Chest symmetric Cardio vascular:  S1/S2, RRR, No murmure, No Rubs or Gallops  pulmonary: Clear to auscultation bilaterally, respirations unlabored, negative wheezes / crackles Abdomen: Soft, non-tender, non-distended, bowel sounds,no masses, no organomegaly Muscular skeletal:  Enlargement is noted, Limited exam - in  bed, able to move all 3 extremities, shoulder, arm in a sling limited range of motion due to pain,Neuro: CNII-XII intact. , normal motor and sensation, reflexes intact  Extremities: No pitting edema lower extremities, +2 pulses, shoulder immobilized, limited range of motion due to pain Skin: Dry, warm to touch, negative for any Rashes, No open wounds Wounds: per nursing documentation  LABs:  CBC Latest Ref Rng & Units 09/25/2017 09/24/2017 05/06/2015  WBC 4.0 - 10.5 K/uL 17.8(H) 20.6(H) 9.9  Hemoglobin 12.0 - 15.0 g/dL 12.4 13.9 13.3  Hematocrit 36.0 - 46.0 % 38.7 40.4 39.5  Platelets 150 - 400 K/uL 286 403(H) 283   CMP Latest Ref Rng &  Units 09/25/2017 09/24/2017 05/06/2015  Glucose 70 - 99 mg/dL 127(H) 128(H) 99  BUN 8 - 23 mg/dL 24(H) 20 23(H)  Creatinine 0.44 - 1.00 mg/dL 1.09(H) 0.89 1.07(H)  Sodium 135 - 145 mmol/L 139 140 136  Potassium 3.5 - 5.1 mmol/L 4.5 3.6 3.4(L)  Chloride 98 - 111 mmol/L 105 103 102  CO2 22 - 32 mmol/L 20(L) 25 22  Calcium 8.9 - 10.3 mg/dL 9.6 10.0 9.1  Total Protein 6.5 - 8.1 g/dL - 7.9 -  Total Bilirubin 0.3 - 1.2 mg/dL - 1.5(H) -  Alkaline Phos 38 - 126 U/L - 54 -  AST 15 - 41 U/L - 27 -  ALT 0 - 44 U/L - 18 -

## 2017-09-26 NOTE — Progress Notes (Signed)
OT Cancellation Note  Patient Details Name: SOO STEELMAN MRN: [id number] DOB: [birth year]   Cancelled Treatment:    Reason Eval/Treat Not Completed: Patient at procedure or test/ unavailable(Vascular Lab)  Merri Ray Telsa Dillavou 09/26/2017, 4:43 PM  Hulda Humphrey OTR/L Acute Rehabilitation Services Pager: [phone] Office: [phone]

## 2017-09-26 NOTE — Progress Notes (Signed)
Patient is transported to Vascular Ultrasound via bed.  The patient is easily forgetful and becomes confused to situation.  The patient's daughter has been at the bedside most of the afternoon.

## 2017-09-26 NOTE — Plan of Care (Signed)
  Problem: Education: Goal: Knowledge of General Education information will improve Description Including pain rating scale, medication(s)/side effects and non-pharmacologic comfort measures Outcome: Progressing   Problem: Clinical Measurements: Goal: Ability to maintain clinical measurements within normal limits will improve Outcome: Progressing Goal: Will remain free from infection Outcome: Progressing Goal: Cardiovascular complication will be avoided Outcome: Not Applicable

## 2017-09-26 NOTE — Progress Notes (Signed)
Carotid duplex prelim Right Carotid:Velocities in the right ICA are consistent with a 1-39% stenosis. Elevated systolic velocity in the 21-03% range. Diastolic velocity may be underestimated due to aortic valve issues.  Left Carotid:Velocities in the left ICA are consistent with a 1-39% stenosis.  Landry Mellow, RDMS, RVT

## 2017-09-26 NOTE — Progress Notes (Signed)
Physical Therapy Treatment Patient Details Name: Megan Pitts MRN: [id number] DOB: 1927-10-24 Today's Date: 09/26/2017    History of Present Illness Pt is an 84 y/o female presenting with right shoulder pain. Pt does not recall fall or how she attained injury. Pt sustained right proximal humerus fx and shoulder dislocation. NWB and immobilized. PMH including A-fib, aortic valve replacement, GERD, HTN, shingles, and vertigo.     PT Comments    Pt making steady progress with functional mobility; however, she continues to demonstrate modest instability with ambulation requiring min A for safety. Pt would continue to benefit from skilled physical therapy services at this time while admitted and after d/c to address the below listed limitations in order to improve overall safety and independence with functional mobility.    Follow Up Recommendations  SNF     Equipment Recommendations  None recommended by PT    Recommendations for Other Services       Precautions / Restrictions Precautions Precautions: Shoulder;Fall Type of Shoulder Precautions: s/p right proximal humerus fx. NWBing and immobilized. Verbal order from Dr. Stann Mainland for out of sling during bathing and dressing. Ok for hand, wrist, and elbow exercises.  Shoulder Interventions: Shoulder sling/immobilizer;Off for dressing/bathing/exercises Precaution Booklet Issued: No Required Braces or Orthoses: Sling Restrictions Weight Bearing Restrictions: Yes RUE Weight Bearing: Non weight bearing Other Position/Activity Restrictions: Per Dr. Victorino December. WFL for hand, wrist, and elbow. No shoulder ROM. Ok for out of sling during bathing/dressing    Mobility  Bed Mobility               General bed mobility comments: pt OOB in recliner chair upon arrival  Transfers Overall transfer level: Needs assistance Equipment used: 1 person hand held assist Transfers: Sit to/from Stand Sit to Stand: Min assist         General  transfer comment: min A for stability with transition  Ambulation/Gait Ambulation/Gait assistance: Min assist;Min guard Gait Distance (Feet): 200 Feet(200' x1 and 100' x1 with sitting rest break in between) Assistive device: 1 person hand held assist Gait Pattern/deviations: Step-through pattern;Decreased step length - right;Decreased step length - left;Decreased stride length;Narrow base of support;Drifts right/left Gait velocity: decreased Gait velocity interpretation: 1.31 - 2.62 ft/sec, indicative of limited community ambulator General Gait Details: pt with modest instability throughout with several LOB laterally requiring min A to maintain upright position; occasional brief periods with close min guard. pt with poor safety awareness throughout   Stairs Stairs: Yes Stairs assistance: Min assist Stair Management: One rail Left;Alternating pattern;Step to pattern;Forwards Number of Stairs: 5(x2 trials) General stair comments: cueing for safety , use of L hand rail and min A for stability   Wheelchair Mobility    Modified Rankin (Stroke Patients Only)       Balance Overall balance assessment: Needs assistance Sitting-balance support: No upper extremity supported;Feet supported Sitting balance-Leahy Scale: Good     Standing balance support: Single extremity supported;During functional activity Standing balance-Leahy Scale: Poor                              Cognition Arousal/Alertness: Awake/alert Behavior During Therapy: WFL for tasks assessed/performed Overall Cognitive Status: No family/caregiver present to determine baseline cognitive functioning Area of Impairment: Memory;Safety/judgement;Problem solving                     Memory: Decreased short-term memory   Safety/Judgement: Decreased awareness of deficits;Decreased awareness of safety  Problem Solving: Difficulty sequencing;Requires verbal cues        Exercises      General  Comments        Pertinent Vitals/Pain Pain Assessment: Faces Faces Pain Scale: Hurts little more Pain Location: R hand (IV site) Pain Descriptors / Indicators: Guarding Pain Intervention(s): Monitored during session;Repositioned    Home Living                      Prior Function            PT Goals (current goals can now be found in the care plan section) Acute Rehab PT Goals PT Goal Formulation: With patient Time For Goal Achievement: 10/09/17 Potential to Achieve Goals: Fair Progress towards PT goals: Progressing toward goals    Frequency    Min 3X/week      PT Plan Current plan remains appropriate    Co-evaluation              AM-PAC PT "6 Clicks" Daily Activity  Outcome Measure  Difficulty turning over in bed (including adjusting bedclothes, sheets and blankets)?: Unable Difficulty moving from lying on back to sitting on the side of the bed? : Unable Difficulty sitting down on and standing up from a chair with arms (e.g., wheelchair, bedside commode, etc,.)?: Unable Help needed moving to and from a bed to chair (including a wheelchair)?: A Little Help needed walking in hospital room?: A Little Help needed climbing 3-5 steps with a railing? : A Little 6 Click Score: 12    End of Session Equipment Utilized During Treatment: Gait belt;Other (comment)(R UE sling) Activity Tolerance: Patient tolerated treatment well Patient left: in chair;with call bell/phone within reach;with chair alarm set Nurse Communication: Mobility status PT Visit Diagnosis: Unsteadiness on feet (R26.81);Pain Pain - Right/Left: Right Pain - part of body: Hand     Time: 9323-5573 PT Time Calculation (min) (ACUTE ONLY): 10 min  Charges:  $Gait Training: 8-22 mins                     Sherie Don, [place], DPT  Acute Rehabilitation Services Pager [phone] Office [place] 09/26/2017, 10:56 AM

## 2017-09-27 DIAGNOSIS — R55 Syncope and collapse: Secondary | ICD-10-CM | POA: Insufficient documentation

## 2017-09-27 LAB — CBC WITH DIFFERENTIAL/PLATELET
ABS IMMATURE GRANULOCYTES: 0.1 10*3/uL (ref 0.0–0.1)
Basophils Absolute: 0.1 10*3/uL (ref 0.0–0.1)
Basophils Relative: 0 %
Eosinophils Absolute: 0.1 10*3/uL (ref 0.0–0.7)
Eosinophils Relative: 0 %
HEMATOCRIT: 34.5 % — AB (ref 36.0–46.0)
HEMOGLOBIN: 11.7 g/dL — AB (ref 12.0–15.0)
Immature Granulocytes: 0 %
LYMPHS ABS: 1.4 10*3/uL (ref 0.7–4.0)
LYMPHS PCT: 11 %
MCH: 30.1 pg (ref 26.0–34.0)
MCHC: 33.9 g/dL (ref 30.0–36.0)
MCV: 88.7 fL (ref 78.0–100.0)
MONOS PCT: 10 %
Monocytes Absolute: 1.4 10*3/uL — ABNORMAL HIGH (ref 0.1–1.0)
Neutro Abs: 10.6 10*3/uL — ABNORMAL HIGH (ref 1.7–7.7)
Neutrophils Relative %: 79 %
Platelets: 242 10*3/uL (ref 150–400)
RBC: 3.89 MIL/uL (ref 3.87–5.11)
RDW: 12.7 % (ref 11.5–15.5)
WBC: 13.6 10*3/uL — ABNORMAL HIGH (ref 4.0–10.5)

## 2017-09-27 LAB — PROCALCITONIN: Procalcitonin: 0.13 ng/mL

## 2017-09-27 MED ORDER — ACETAMINOPHEN 325 MG PO TABS: 650.0000 mg | ORAL_TABLET | Freq: Four times a day (QID) | ORAL | Status: DC | PRN

## 2017-09-27 MED ORDER — IBUPROFEN 200 MG PO TABS
400.0000 mg | ORAL_TABLET | Freq: Four times a day (QID) | ORAL | Status: DC | PRN
  Administered 2017-09-27 – 2017-09-28 (×4): 400 mg via ORAL
  Filled 2017-09-27 (×4): qty 2

## 2017-09-27 MED ORDER — HYDROMORPHONE HCL 1 MG/ML IJ SOLN: 0.5000 mg | INTRAMUSCULAR | Status: DC | PRN

## 2017-09-27 MED ORDER — HYDROCODONE-ACETAMINOPHEN 5-325 MG PO TABS: 1.0000 | ORAL_TABLET | Freq: Four times a day (QID) | ORAL | Status: DC | PRN

## 2017-09-27 NOTE — Progress Notes (Signed)
Physical Therapy Treatment Patient Details Name: Megan Pitts MRN: [id number] DOB: [birth year] Today's Date: 09/27/2017    History of Present Illness Pt is an 84 y/o female presenting with right shoulder pain. Pt does not recall fall or how she attained injury. Pt sustained right proximal humerus fx and shoulder dislocation. NWB and immobilized. PMH including A-fib, aortic valve replacement, GERD, HTN, shingles, and vertigo.     PT Comments    Pt is progressing; continue to recommend  SNF to maximize pt safety and independence; pt continues to be a significantt fall risk  Follow Up Recommendations  SNF     Equipment Recommendations  None recommended by PT    Recommendations for Other Services       Precautions / Restrictions Precautions Precautions: Shoulder;Fall Type of Shoulder Precautions: s/p right proximal humerus fx. NWBing and immobilized. Verbal order from Dr. Stann Mainland for out of sling during bathing and dressing. Ok for hand, wrist, and elbow exercises.  Shoulder Interventions: Shoulder sling/immobilizer;Off for dressing/bathing/exercises Precaution Booklet Issued: No Required Braces or Orthoses: Sling Restrictions Weight Bearing Restrictions: Yes RUE Weight Bearing: Non weight bearing    Mobility  Bed Mobility Overal bed mobility: Needs Assistance Bed Mobility: Sit to Supine       Sit to supine: Min assist   General bed mobility comments: for safety and bringing LEs onto bed  Transfers Overall transfer level: Needs assistance Equipment used: None Transfers: Sit to/from Stand Sit to Stand: Min assist         General transfer comment: min A for stability with transition  Ambulation/Gait Ambulation/Gait assistance: Min assist;Min guard Gait Distance (Feet): 300 Feet Assistive device: None;IV Pole;1 person hand held assist;Straight cane Gait Pattern/deviations: Step-through pattern;Decreased step length - right;Decreased step length - left;Decreased  stride length;Narrow base of support;Drifts right/left Gait velocity: decreased   General Gait Details: pt with modest instability throughout with LOB 4 times requiring min assist to recover, extremely delayed reactions; only occasional brief periods with close min guard. pt with poor safety awareness throughout;  amb with HHA, cane, IV pole and no AD---improved posture with cues for upward gaze and no AD; pt is extremely unsafe   Chief Strategy Officer    Modified Rankin (Stroke Patients Only)       Balance Overall balance assessment: Needs assistance Sitting-balance support: No upper extremity supported;Feet supported Sitting balance-Leahy Scale: Good     Standing balance support: Single extremity supported;No upper extremity supported;During functional activity Standing balance-Leahy Scale: Fair Standing balance comment: pt is able to statically stand with PT for brief period without UE support, unable to wt shift              High level balance activites: Side stepping;Backward walking;Direction changes;Turns;Sudden stops High Level Balance Comments: min to min/guard  needed for all above; pt with more frequent LOB with casual walking and conversation vs with dynamic gait/balance activities--?possibly related to attention-?            Cognition Arousal/Alertness: Awake/alert Behavior During Therapy: WFL for tasks assessed/performed Overall Cognitive Status: No family/caregiver present to determine baseline cognitive functioning Area of Impairment: Memory;Safety/judgement;Problem solving                   Current Attention Level: Sustained Memory: Decreased short-term memory;Decreased recall of precautions   Safety/Judgement: Decreased awareness of deficits;Decreased awareness of safety   Problem Solving: Difficulty sequencing;Requires verbal cues General Comments: Pt slightly  impulsive and requires cues to slow down. Decreased safety  awareness       Exercises General Exercises - Lower Extremity Hip Flexion/Marching: AROM;Both;10 reps;Standing Heel Raises: AROM;Both;10 reps;Standing    General Comments        Pertinent Vitals/Pain Pain Assessment: Faces Faces Pain Scale: Hurts a little bit Pain Location: RUE Pain Descriptors / Indicators: Guarding Pain Intervention(s): Limited activity within patient's tolerance;Monitored during session;Repositioned;Premedicated before session    Home Living                      Prior Function            PT Goals (current goals can now be found in the care plan section) Acute Rehab PT Goals Patient Stated Goal: go to home or rehab PT Goal Formulation: With patient Time For Goal Achievement: 10/09/17 Potential to Achieve Goals: Fair Progress towards PT goals: Progressing toward goals    Frequency    Min 3X/week      PT Plan Current plan remains appropriate    Co-evaluation              AM-PAC PT "6 Clicks" Daily Activity  Outcome Measure  Difficulty turning over in bed (including adjusting bedclothes, sheets and blankets)?: Unable Difficulty moving from lying on back to sitting on the side of the bed? : Unable Difficulty sitting down on and standing up from a chair with arms (e.g., wheelchair, bedside commode, etc,.)?: Unable Help needed moving to and from a bed to chair (including a wheelchair)?: A Little Help needed walking in hospital room?: A Little Help needed climbing 3-5 steps with a railing? : A Lot 6 Click Score: 11    End of Session Equipment Utilized During Treatment: Gait belt(R UE sling) Activity Tolerance: Patient tolerated treatment well Patient left: in bed;with call bell/phone within reach;with bed alarm set   PT Visit Diagnosis: Unsteadiness on feet (R26.81);Pain Pain - Right/Left: Right Pain - part of body: Hand;Shoulder     Time: 9038-3338 PT Time Calculation (min) (ACUTE ONLY): 20 min  Charges:  $Gait  Training: 8-22 mins                     Kenyon Ana, PT Pager: [phone] 09/27/2017   [facility] Dept [phone]    [facility] 09/27/2017, 4:38 PM

## 2017-09-27 NOTE — Progress Notes (Signed)
PROGRESS NOTE    Patient: Megan Pitts                            PCP: Marton Redwood, MD                    DOB: 1927/02/23            DOA: 09/24/2017 WLS:[id number]             DOS: 09/27/2017, 10:43 AM   LOS: 1 day   Date of Service: The patient was seen and examined on 09/27/2017  Subjective:   Patient was seen and examined this morning, stable laying in bed, still complaining of shoulder pain and limited movement of her right arm with swelling of the arm and hand.  In a sling. Limited range of motion in use of the right arm due to pain and discomfort.  Nursing staff and PT/OT patient also has difficulty ambulating yesterday.  Patient still has no recollection of the event at home where she dislocated her shoulder and fell.  Otherwise stable no issues overnight.   Brief Narrative:   Megan Pitts a 84 y.o.femalewithpastmedical history significantforhypertension, hyperlipidemia, diastolic CHF, aortic stenosis status post aortic valve replacement who presents from home with complaints of pain on her right shoulder. She reports she couldn't remember how she ended up with a dislocation. All she remembers is waking around 2 am, going to the restroom and sit in a recliner and unable to move her legs . She is not a great historian and detailed history is not available as she lives by herself. She appears to have a h/o dementia and initial work up to check stroke is negative with a negative CT head. Family were concerned about the events and her unable to recall events and unable to move.  MRI of the brain was obtained, revealing no acute CVA, or cerebellar infarct was identified.   Principal Problem:   Fracture dislocation of right shoulder joint Active Problems:   Hypertension   Hyperlipidemia   S/P AVR   Shoulder fracture, right   Chronic diastolic CHF (congestive heart failure) (HCC)   Leucocytosis   Syncope and collapse    Assessment & Plan:    Fracture dislocation of  right shoulder joint Active Problems:   Hypertension   Hyperlipidemia   S/P AVR   Chronic diastolic CHF (congestive heart failure) (HCC)   Leucocytosis   Dislocation of the right shoulder : -Possibly secondary to fall,  syncope Status post close reduction by orthopedic team,  -Currently in a sling immobilizer, complaining of pain limited range of motion -She is right-handed -Continue pain management.  PT eval recommending SNF.    Confusion/ syncope/fall  -Patient's mentation is back to baseline -Neurochecks reporting no focal neurological findings or change in mental status overnight or this morning. -Unsteady gait, resulting to her falls, PT/OT still evaluating and working with the patient -Resolved, CT of the head was negative, MRI of the brain was reviewed in detail, reporting old cerebellar infarct negative for any acute infarct -Patient's mentation was at baseline this morning, was able to communicate and follow commands well. -We will continue with neurochecks, - Bilateral carotid studies -revealed no acute stenosis, 1-39% stenosis bilateral -2D echocardiogram -from 03/27/2017 was reviewed reporting lV EJF 65-70%, grade 2 dCHF -Continue monitor the patient on a monitored bed for possible erratic dysrhythmia (heart rates varies 56-79)  Pain management -PRN IV  Dilaudid, p.o. analgesics  Dehydration -Gentle IV fluid hydration -Monitoring daily weight, BUN/creatinine,   Hypertension; -stable at this time,  -We will check for orthostatic blood pressures   Chronic diastolic heart failure: -Highly dehydrated, status post 24-hour gentle IV fluid hydration -Daily weight -Denies any shortness of breath or chest pain   Leukocytosis: -Improving, no signs of acute infection, -Possible reactive -Chest x-ray UA negative -No signs of abdominal pain diarrhea or constipation -No antibiotics at this time   Mild renal insufficiency Suspect from dehydration .  Gently  hydrate for 24 hours and recheck renal parameters in am.    H/o AVR.  Resume aspirin.    Dementia:  Resume aricept.    Hyperlipidemia: resume Crestor.   DVT prophylaxis:  Lovenox.  Code Status:  Full code.  Family Communication:  Patient family not bedside, planning to call granddaughter Disposition Plan:pending MRI brain.   Due to patient's multiple comorbidities: Continue to be declining, status post shoulder dislocation possibly due to fall, and syncope, comorbidities of dementia, hypertension, hyperlipidemia, systolic congestive father, aortic stenosis... The patient severe debility, status post PT evaluation I am recommending for patient to be switched to inpatient, to determine the possible cause of her syncope, reduce her risk of falls. Recommending her for skilled nursing facility at discharge.    Procedures:     This post close reduction of the right shoulder  Antimicrobials:  Anti-infectives (From admission, onward)   None       Medication:  . amLODipine  10 mg Oral Daily  . aspirin EC  81 mg Oral Daily  . donepezil  10 mg Oral QHS  . enoxaparin (LOVENOX) injection  30 mg Subcutaneous Q24H  . irbesartan  300 mg Oral Daily  . oxybutynin  10 mg Oral QHS  . propofol  0.5 mg/kg Intravenous Once  . propofol  0.5 mg/kg Intravenous Once  . rosuvastatin  20 mg Oral Daily    HYDROmorphone (DILAUDID) injection, morphine injection     Objective:   Vitals:   09/26/17 1800 09/26/17 1955 09/26/17 2057 09/27/17 0344  BP:   (!) 158/50 (!) 154/39  Pulse:   77 67  Resp:    18  Temp:   98.3 F (36.8 C) 98.4 F (36.9 C)  TempSrc:   Oral Oral  SpO2:   98% 96%  Weight: 71.9 kg 71.9 kg      Intake/Output Summary (Last 24 hours) at 09/27/2017 1043 Last data filed at 09/27/2017 0500 Gross per 24 hour  Intake -  Output 500 ml  Net -500 ml   Filed Weights   09/24/17 1638 09/26/17 1800 09/26/17 1955  Weight: 70.1 kg 71.9 kg 71.9 kg     Examination:     General exam: Appears calm and comfortable  BP (!) 154/39 (BP Location: Left Arm)   Pulse 67   Temp 98.4 F (36.9 C) (Oral)   Resp 18   Wt 71.9 kg   SpO2 96%   BMI 28.53 kg/m    Physical Exam  Constitution:  Alert, cooperative, no distress,  Psychiatric: Normal and stable mood and affect, cognition intact,   HEENT: Normocephalic, PERRL, otherwise with in Normal limits  Chest:Chest symmetric Cardio vascular:  S1/S2, RRR, No murmure, No Rubs or Gallops  pulmonary: Clear to auscultation bilaterally, respirations unlabored, negative wheezes / crackles Abdomen: Soft, non-tender, non-distended, bowel sounds,no masses, no organomegaly Muscular skeletal:  Enlargement is noted, Limited exam - in bed, able to move all 3 extremities, shoulder, arm in  a sling limited range of motion due to pain,Neuro: CNII-XII intact. , normal motor and sensation, reflexes intact  Extremities: No pitting edema lower extremities, +2 pulses, shoulder immobilized, limited range of motion due to pain Skin: Dry, warm to touch, negative for any Rashes, No open wounds Wounds: per nursing documentation  LABs:  CBC Latest Ref Rng & Units 09/27/2017 09/25/2017 09/24/2017  WBC 4.0 - 10.5 K/uL 13.6(H) 17.8(H) 20.6(H)  Hemoglobin 12.0 - 15.0 g/dL 11.7(L) 12.4 13.9  Hematocrit 36.0 - 46.0 % 34.5(L) 38.7 40.4  Platelets 150 - 400 K/uL 242 286 403(H)   CMP Latest Ref Rng & Units 09/25/2017 09/24/2017 05/06/2015  Glucose 70 - 99 mg/dL 127(H) 128(H) 99  BUN 8 - 23 mg/dL 24(H) 20 23(H)  Creatinine 0.44 - 1.00 mg/dL 1.09(H) 0.89 1.07(H)  Sodium 135 - 145 mmol/L 139 140 136  Potassium 3.5 - 5.1 mmol/L 4.5 3.6 3.4(L)  Chloride 98 - 111 mmol/L 105 103 102  CO2 22 - 32 mmol/L 20(L) 25 22  Calcium 8.9 - 10.3 mg/dL 9.6 10.0 9.1  Total Protein 6.5 - 8.1 g/dL - 7.9 -  Total Bilirubin 0.3 - 1.2 mg/dL - 1.5(H) -  Alkaline Phos 38 - 126 U/L - 54 -  AST 15 - 41 U/L - 27 -  ALT 0 - 44 U/L - 18 -

## 2017-09-27 NOTE — Progress Notes (Addendum)
CSW referral for SNF placement. Pt was obs but she is currently IP. Notified [facility] First rep, Tommi Rumps. Jonnie Finner RN CCM Case Mgmt phone [phone]

## 2017-09-27 NOTE — NC FL2 (Signed)
[place] LEVEL OF CARE SCREENING TOOL     IDENTIFICATION  Patient Name: Megan Pitts Birthdate: 04/02/1927 Sex: female Admission Date (Current Location): 09/24/2017  [facility] and [place] Number:  Herbalist and Address:  [facility] [place], [place] [address] [place], [place] [address]      Provider Number: [id number]  Attending Physician Name and Address:  Deatra James, MD  Relative Name and Phone Number:   [name], [phone]    Current Level of Care: Hospital Recommended Level of Care: [place] Prior Approval Number:    Date Approved/Denied:   PASRR Number: [id number]  Discharge Plan: SNF    Current Diagnoses: Patient Active Problem List   Diagnosis Date Noted  . Syncope, vasovagal 09/27/2017  . Syncope and collapse 09/26/2017  . Fracture dislocation of right shoulder joint 09/24/2017  . Shoulder fracture, right 09/24/2017  . Chronic diastolic CHF (congestive heart failure) ([place]) 09/24/2017  . Leucocytosis 09/24/2017  . Cellulitis 05/05/2015  . S/P AVR 10/26/2010  . Aortic stenosis   . Hypertension   . PVC's (premature ventricular contractions)   . Hyperlipidemia     Orientation RESPIRATION BLADDER Height & Weight     Self, Time, Situation, Place  Normal Continent, External catheter Weight: 158 lb 8.2 oz (71.9 kg) Height:  5\' 3"  (160 cm)  BEHAVIORAL SYMPTOMS/MOOD NEUROLOGICAL BOWEL NUTRITION STATUS      Continent Diet  AMBULATORY STATUS COMMUNICATION OF NEEDS Skin   Limited Assist Verbally Other (Comment), Normal(fracture in sling)                       Personal Care Assistance Level of Assistance  Bathing, Feeding, Dressing Bathing Assistance: Limited assistance Feeding assistance: Independent Dressing Assistance: Limited assistance     Functional Limitations Info  Sight, Speech, Hearing Sight Info: Adequate Hearing Info: Adequate Speech Info: Adequate    SPECIAL CARE  FACTORS FREQUENCY  PT (By licensed PT), OT (By licensed OT)     PT Frequency: 5x week OT Frequency: 5x week            Contractures Contractures Info: Not present    Additional Factors Info  Code Status, Allergies, Psychotropic Code Status Info: Full Code Allergies Info: SYNTHROID LEVOTHYROXINE SODIUM  Psychotropic Info: donepezil (ARICEPT) tablet 10 mg daily at bedtime         Current Medications (09/27/2017):  This is the current hospital active medication list Current Facility-Administered Medications  Medication Dose Route Frequency Provider Last Rate Last Dose  . 0.9 %  sodium chloride infusion   Intravenous Continuous Shahmehdi, Seyed A, MD 50 mL/hr at 09/27/17 1132    . acetaminophen (TYLENOL) tablet 650 mg  650 mg Oral Q6H PRN Shahmehdi, Seyed A, MD      . amLODipine (NORVASC) tablet 10 mg  10 mg Oral Daily Shelly Coss, MD   10 mg at 09/27/17 1047  . aspirin EC tablet 81 mg  81 mg Oral Daily Shelly Coss, MD   81 mg at 09/27/17 1030  . donepezil (ARICEPT) tablet 10 mg  10 mg Oral QHS Shelly Coss, MD   10 mg at 09/26/17 2234  . enoxaparin (LOVENOX) injection 30 mg  30 mg Subcutaneous Q24H Shelly Coss, MD   30 mg at 09/26/17 2235  . HYDROcodone-acetaminophen (NORCO/VICODIN) 5-325 MG per tablet 1 tablet  1 tablet Oral Q6H PRN Shahmehdi, Seyed A, MD      . HYDROmorphone (DILAUDID) injection  0.5 mg  0.5 mg Intravenous Q4H PRN Shahmehdi, Seyed A, MD      . ibuprofen (ADVIL,MOTRIN) tablet 400 mg  400 mg Oral Q6H PRN Shahmehdi, Seyed A, MD   400 mg at 09/27/17 1408  . irbesartan (AVAPRO) tablet 300 mg  300 mg Oral Daily Shelly Coss, MD   300 mg at 09/27/17 1030  . morphine 2 MG/ML injection 2 mg  2 mg Intravenous Q4H PRN Shelly Coss, MD   2 mg at 09/26/17 2305  . oxybutynin (DITROPAN-XL) 24 hr tablet 10 mg  10 mg Oral QHS Shelly Coss, MD   10 mg at 09/26/17 2234  . propofol (DIPRIVAN) 10 mg/mL bolus/IV push 35.1 mg  0.5 mg/kg Intravenous Once Adhikari,  Amrit, MD      . propofol (DIPRIVAN) 10 mg/mL bolus/IV push 35.1 mg  0.5 mg/kg Intravenous Once Valarie Merino, MD      . rosuvastatin (CRESTOR) tablet 20 mg  20 mg Oral Daily Shelly Coss, MD   20 mg at 09/27/17 1030     Discharge Medications: Please see discharge summary for a list of discharge medications.  Relevant Imaging Results:  Relevant Lab Results:   Additional Information SS# [id number]  Alexander Mt, LCSWA

## 2017-09-27 NOTE — Progress Notes (Signed)
Occupational Therapy Treatment Patient Details Name: Megan Pitts MRN: [id number] DOB: [birth year] Today's Date: 09/27/2017    History of present illness Pt is an 84 y/o female presenting with right shoulder pain. Pt does not recall fall or how she attained injury. Pt sustained right proximal humerus fx and shoulder dislocation. NWB and immobilized. PMH including A-fib, aortic valve replacement, GERD, HTN, shingles, and vertigo.    OT comments  Pt progressing towards acute OT goals. Focus of session was toilet transfer and education on sling management/positioning. Attempted hand and wrist ROM exercises but pain limiting this session. Pt noted to be slightly impulsive especially when ambulating in the room, decreased safety awareness (leaving IV pole behind, etc). D/c plan remains appropriate.    Follow Up Recommendations  SNF;Supervision/Assistance - 24 hour    Equipment Recommendations  Other (comment)(Defer to next venue)    Recommendations for Other Services      Precautions / Restrictions Precautions Precautions: Shoulder;Fall Type of Shoulder Precautions: s/p right proximal humerus fx. NWBing and immobilized. Verbal order from Dr. Stann Mainland for out of sling during bathing and dressing. Ok for hand, wrist, and elbow exercises.  Shoulder Interventions: Shoulder sling/immobilizer;Off for dressing/bathing/exercises Required Braces or Orthoses: Sling Restrictions Weight Bearing Restrictions: Yes RUE Weight Bearing: Non weight bearing Other Position/Activity Restrictions: Per Dr. Victorino December. WFL for hand, wrist, and elbow. No shoulder ROM. Ok for out of sling during bathing/dressing       Mobility Bed Mobility               General bed mobility comments: pt OOB in recliner chair upon arrival  Transfers Overall transfer level: Needs assistance Equipment used: 1 person hand held assist Transfers: Sit to/from Stand Sit to Stand: Min assist         General transfer  comment: min A for stability with transition    Balance Overall balance assessment: Needs assistance Sitting-balance support: No upper extremity supported;Feet supported Sitting balance-Leahy Scale: Good Sitting balance - Comments:    Standing balance support: Single extremity supported;During functional activity Standing balance-Leahy Scale: Poor Standing balance comment: unsafe in standing without support                           ADL either performed or assessed with clinical judgement   ADL Overall ADL's : Needs assistance/impaired     Grooming: Minimal assistance;Standing;Wash/dry hands Grooming Details (indicate cue type and reason): Min A for standing balance at sink                 Toilet Transfer: Minimal assistance;Ambulation(3n1 over toilet) Toilet Transfer Details (indicate cue type and reason): Min A for balance and safety Toileting- Clothing Manipulation and Hygiene: Minimal assistance;Sit to/from stand Toileting - Clothing Manipulation Details (indicate cue type and reason): Min A for managing underwear during toileting     Functional mobility during ADLs: Minimal assistance General ADL Comments: Pt completed toilet transfer, pericare, grooming task. Attempted e/w/h ROM exercises but pain limiting     Vision       Perception     Praxis      Cognition Arousal/Alertness: Awake/alert Behavior During Therapy: WFL for tasks assessed/performed Overall Cognitive Status: No family/caregiver present to determine baseline cognitive functioning Area of Impairment: Memory;Safety/judgement;Problem solving                     Memory: Decreased short-term memory;Decreased recall of precautions   Safety/Judgement: Decreased awareness of deficits;Decreased  awareness of safety   Problem Solving: Difficulty sequencing;Requires verbal cues General Comments: Pt slightly impulsive and requires cues to slow down. Decreased safety awareness          Exercises     Shoulder Instructions       General Comments      Pertinent Vitals/ Pain       Pain Assessment: Faces Faces Pain Scale: Hurts whole lot Pain Location: RUE Pain Descriptors / Indicators: Guarding Pain Intervention(s): Limited activity within patient's tolerance;Monitored during session;Repositioned  Home Living                                          Prior Functioning/Environment              Frequency  Min 2X/week        Progress Toward Goals  OT Goals(current goals can now be found in the care plan section)  Progress towards OT goals: Progressing toward goals  Acute Rehab OT Goals Patient Stated Goal: go to home or rehab OT Goal Formulation: With patient Time For Goal Achievement: 10/09/17 Potential to Achieve Goals: Good ADL Goals Pt Will Perform Upper Body Bathing: with min assist;with caregiver independent in assisting;sitting Pt Will Perform Upper Body Dressing: with min assist;sitting Pt Will Transfer to Toilet: with modified independence;ambulating;regular height toilet Pt Will Perform Toileting - Clothing Manipulation and hygiene: with modified independence;sit to/from stand Pt/caregiver will Perform Home Exercise Program: Increased ROM;Increased strength;Right Upper extremity;With written HEP provided;With Supervision  Plan Discharge plan remains appropriate    Co-evaluation                 AM-PAC PT "6 Clicks" Daily Activity     Outcome Measure   Help from another person eating meals?: A Little Help from another person taking care of personal grooming?: A Little Help from another person toileting, which includes using toliet, bedpan, or urinal?: A Little Help from another person bathing (including washing, rinsing, drying)?: A Lot Help from another person to put on and taking off regular upper body clothing?: A Lot Help from another person to put on and taking off regular lower body clothing?: A Little 6  Click Score: 16    End of Session Equipment Utilized During Treatment: Other (comment)(sling)  OT Visit Diagnosis: Unsteadiness on feet (R26.81);Other abnormalities of gait and mobility (R26.89);Muscle weakness (generalized) (M62.81);Pain Pain - Right/Left: Right Pain - part of body: Shoulder   Activity Tolerance Patient limited by pain   Patient Left in chair;with call bell/phone within reach;with chair alarm set   Nurse Communication          Time: 4801-6553 OT Time Calculation (min): 16 min  Charges: OT General Charges $OT Visit: 1 Visit OT Treatments $Self Care/Home Management : 8-22 mins  Tyrone Schimke, OT Acute Rehabilitation Services Pager: [phone] Office: [phone]    Hortencia Pilar 09/27/2017, 2:00 PM

## 2017-09-27 NOTE — Plan of Care (Signed)

## 2017-09-28 LAB — CBC
HEMATOCRIT: 31.8 % — AB (ref 36.0–46.0)
Hemoglobin: 10.4 g/dL — ABNORMAL LOW (ref 12.0–15.0)
MCH: 29.9 pg (ref 26.0–34.0)
MCHC: 32.7 g/dL (ref 30.0–36.0)
MCV: 91.4 fL (ref 78.0–100.0)
Platelets: 278 10*3/uL (ref 150–400)
RBC: 3.48 MIL/uL — ABNORMAL LOW (ref 3.87–5.11)
RDW: 12.8 % (ref 11.5–15.5)
WBC: 10.5 10*3/uL (ref 4.0–10.5)

## 2017-09-28 NOTE — Care Management Important Message (Signed)
Important Message  Patient Details  Name: Megan Pitts MRN: [id number] Date of Birth: 04-11-1927   Medicare Important Message Given:  Yes    Trayvion Embleton 09/28/2017, 2:16 PM

## 2017-09-28 NOTE — Clinical Social Work Note (Signed)
Clinical Social Work Assessment  Patient Details  Name: Megan Pitts MRN: [id number] Date of Birth: February 06, 1927  Date of referral:  09/28/17               Reason for consult:  Facility Placement                Permission sought to share information with:  Facility Sport and exercise psychologist, Family Supports Permission granted to share information::  Yes, Verbal Permission Granted  Name::     [place]::  SNFs  Relationship::  daughter  Contact Information:  [phone]  Housing/Transportation Living arrangements for the past 2 months:  [place] of Information:  Patient, Adult Children Patient Interpreter Needed:  None Criminal Activity/Legal Involvement Pertinent to Current Situation/Hospitalization:  No - Comment as needed Significant Relationships:  None Lives with:  Self Do you feel safe going back to the place where you live?  No Need for family participation in patient care:  Yes (Comment)  Care giving concerns:  CSW received consult for discharge needs. CSW spoke with patient regarding PT recommendation of SNF placement at time of discharge. Patients she lives alone. Patient's daughter recently had knee surgery and is unable to assist her mother.   Social Worker assessment / plan:  CSW spoke with patient concerning possibility of rehab at [facility] before returning home.   Employment status:  Retired Forensic scientist:  Medicare PT Recommendations:  [place] / Referral to community resources:  [place]  Patient/Family's Response to care:  Patient and family  recognizes need for rehab before returning home and is agreeable to a SNF in [place]. Patient reported preference for [facility].    Patient/Family's Understanding of and Emotional Response to Diagnosis, Current Treatment, and Prognosis:  Patient/family is realistic regarding therapy needs and expressed being hopeful for SNF placement.  Patient expressed understanding of CSW role and discharge process as well as medical condition. No questions/concerns about plan or treatment.   Emotional Assessment Appearance:  Appears stated age Attitude/Demeanor/Rapport:  Engaged Affect (typically observed):  Accepting, Appropriate, Calm, Hopeful, Pleasant Orientation:  Oriented to Self, Oriented to Place, Oriented to  Time, Oriented to Situation Alcohol / Substance use:  Not Applicable Psych involvement (Current and /or in the community):  No (Comment)  Discharge Needs  Concerns to be addressed:  Care Coordination Readmission within the last 30 days:  No Current discharge risk:  Lives alone Barriers to Discharge:  Continued Medical Work up   [employer], [place] 09/28/2017, 1:57 PM

## 2017-09-28 NOTE — Consult Note (Signed)
            [facility] CM Primary Care Navigator  09/28/2017  Megan Pitts 1927/12/19 [id number]   Seenpatient at the bedside toidentify possible discharge needs. Patient had presented from home with complaints of pain on her right shoulder but reports that she couldn't remember how she ended up with a shoulder dislocation. (Fracture dislocation of right shoulder joint status post close reduction)  Patient endorses Dr. Marton Redwood with [facility] as her primary care provider.   Patientstates using [employer] on [facility] toobtain medications withoutdifficulty.  Patient reports managing her own medications athome with use of "pill box" system filled weekly.  Patient reports being able to drive prior to admission/ surgery but her daughter [name]- lives closeby)can providetransportationto her doctors' appointments after discharge.   Patientstatesliving alone at home. She mentioned that other daughter Jackelyn Poling- from [place]) will come and stay with her as she recovers.  Anticipated plan for discharge isskilled nursing facility (SNF)for rehabilitationper therapy recommendation.  Patientvoiced understandingto callprimarycareprovider'soffice whenshereturns backhome,for a post discharge follow-up visitwithin1- 2 weeksor sooner if needs arise.Patient letter (with PCP's contact number) was provided asareminder.   Explained topatientregarding [facility] CM services available for health management andresourcesat homeandvoicedinterest about it. Encouraged patient todiscuss with primary care provider onher next visit about further needsin managingherhealth issues once she getsback home. Patient expressedunderstandingto seekreferral from primary care provider to [facility] care management ifdeemed necessary and appropriatefor anyservicesin the near future-once shereturns home.   [facility] care management information was  provided for futureneeds that she may have.  Primary care provider's office is listed as providing transition of care (TOC) follow-up.    For additional questions please contact:  Edwena Felty A. Delon Revelo, BSN, RN-BC [facility] PRIMARY CARE Navigator Cell: [phone]

## 2017-09-28 NOTE — Progress Notes (Signed)
Occupational Therapy Treatment Patient Details Name: Megan Pitts MRN: [id number] DOB: 03-27-1927 Today's Date: 09/28/2017    History of present illness Pt is an 84 y/o female presenting with right shoulder pain. Pt does not recall fall or how she attained injury. Pt sustained right proximal humerus fx and shoulder dislocation. NWB and immobilized. PMH including A-fib, aortic valve replacement, GERD, HTN, shingles, and vertigo.    OT comments  Session focused on R UE exercises to elbow, wrist and hand.  Required maximal assist for sling management, continued educated on exercises and precautions as patient unable to recall.  Completed exercises with supervision. Patient declined further participation in ADLs.  Pain better managed today, as premedicated before session.  Patient reports completing hand ROM throughout day, requested RN to assist with elbow exercises 1x today. Will continue to follow.    Follow Up Recommendations  SNF;Supervision/Assistance - 24 hour    Equipment Recommendations  Other (comment)(defer to next venue)    Recommendations for Other Services PT consult    Precautions / Restrictions Precautions Precautions: Shoulder;Fall Type of Shoulder Precautions: s/p right proximal humerus fx. NWBing and immobilized. Verbal order from Dr. Stann Mainland for out of sling during bathing and dressing. Ok for hand, wrist, and elbow exercises.  Shoulder Interventions: Shoulder sling/immobilizer;Off for dressing/bathing/exercises Precaution Booklet Issued: No Required Braces or Orthoses: Sling Restrictions Weight Bearing Restrictions: Yes RUE Weight Bearing: Non weight bearing       Mobility Bed Mobility                  Transfers                      Balance                                           ADL either performed or assessed with clinical judgement   ADL                                         General ADL Comments:  session focused on exercises to R UE      Vision       Perception     Praxis      Cognition Arousal/Alertness: Awake/alert Behavior During Therapy: WFL for tasks assessed/performed Overall Cognitive Status: No family/caregiver present to determine baseline cognitive functioning Area of Impairment: Memory;Safety/judgement;Problem solving;Attention                   Current Attention Level: Sustained Memory: Decreased short-term memory;Decreased recall of precautions   Safety/Judgement: Decreased awareness of deficits;Decreased awareness of safety   Problem Solving: Difficulty sequencing;Requires verbal cues          Exercises Exercises: Shoulder;Other exercises Shoulder Exercises Elbow Flexion: AROM;Supine;10 reps;Right Elbow Extension: AROM;Right;10 reps;Supine Wrist Flexion: AROM;Right;10 reps;Supine Wrist Extension: AROM;Right;10 reps;Supine Digit Composite Flexion: AROM;Right;10 reps;Supine Composite Extension: AROM;Right;10 reps;Supine Other Exercises Other Exercises: AROM R supination/pronation, supine   Shoulder Instructions Shoulder Instructions Donning/doffing sling/immobilizer: Maximal assistance Correct positioning of sling/immobilizer: Maximal assistance ROM for elbow, wrist and digits of operated UE: Supervision/safety Sling wearing schedule (on at all times/off for ADL's): Supervision/safety Positioning of UE while sleeping: Supervision/safety     General Comments      Pertinent Vitals/ Pain  Pain Assessment: Faces Faces Pain Scale: Hurts a little bit Pain Location: RUE Pain Descriptors / Indicators: Guarding;Shooting Pain Intervention(s): Limited activity within patient's tolerance;Repositioned;Premedicated before session  Home Living                                          Prior Functioning/Environment              Frequency  Min 2X/week        Progress Toward Goals  OT Goals(current goals can now  be found in the care plan section)  Progress towards OT goals: Progressing toward goals  Acute Rehab OT Goals Patient Stated Goal: go to home or rehab OT Goal Formulation: With patient Time For Goal Achievement: 10/09/17 Potential to Achieve Goals: Good  Plan Discharge plan remains appropriate    Co-evaluation                 AM-PAC PT "6 Clicks" Daily Activity     Outcome Measure   Help from another person eating meals?: A Little Help from another person taking care of personal grooming?: A Little Help from another person toileting, which includes using toliet, bedpan, or urinal?: A Little Help from another person bathing (including washing, rinsing, drying)?: A Lot Help from another person to put on and taking off regular upper body clothing?: A Lot Help from another person to put on and taking off regular lower body clothing?: A Little 6 Click Score: 16    End of Session Equipment Utilized During Treatment: Other (comment)(sling)  OT Visit Diagnosis: Unsteadiness on feet (R26.81);Other abnormalities of gait and mobility (R26.89);Muscle weakness (generalized) (M62.81);Pain Pain - Right/Left: Right Pain - part of body: Shoulder   Activity Tolerance Patient tolerated treatment well   Patient Left in bed;with call bell/phone within reach;with bed alarm set   Nurse Communication Mobility status;Precautions        Time: 9147-8295 OT Time Calculation (min): 14 min  Charges: OT General Charges $OT Visit: 1 Visit OT Treatments $Therapeutic Exercise: 8-22 mins  Delight Stare, [place] Pager [phone] Office [phone]    Delight Stare 09/28/2017, 12:17 PM

## 2017-09-28 NOTE — Progress Notes (Signed)
PROGRESS NOTE    Patient: Megan Pitts                            PCP: Marton Redwood, MD                    DOB: [birth year]            DOA: 09/24/2017 IOE:[id number]             DOS: 09/28/2017, 12:03 PM   LOS: 2 days   Date of Service: The patient was seen and examined on 09/28/2017  Subjective:   Patient was seen and examined this morning, stable no acute distress.  Reporting no issues overnight. No improvement in pain, patient is requesting some ice pack to be applied. Range of motion of the right arm still very much limited  No record of dysrhythmia overnight He denies of having any sensation of palpitation, shortness of breath or chest pain.   Brief Narrative:   Megan Pitts a 84 y.o.femalewithpastmedical history significantforhypertension, hyperlipidemia, diastolic CHF, aortic stenosis status post aortic valve replacement who presents from home with complaints of pain on her right shoulder. She reports she couldn't remember how she ended up with a dislocation. All she remembers is waking around 2 am, going to the restroom and sit in a recliner and unable to move her legs . She is not a great historian and detailed history is not available as she lives by herself. She appears to have a h/o dementia and initial work up to check stroke is negative with a negative CT head. Family were concerned about the events and her unable to recall events and unable to move.  MRI of the brain was obtained, revealing no acute CVA, or cerebellar infarct was identified.   Principal Problem:   Fracture dislocation of right shoulder joint Active Problems:   Hypertension   Hyperlipidemia   S/P AVR   Shoulder fracture, right   Chronic diastolic CHF (congestive heart failure) (HCC)   Leucocytosis   Syncope and collapse    Assessment & Plan:    Fracture dislocation of right shoulder joint Active Problems:   Hypertension   Hyperlipidemia   S/P AVR   Chronic diastolic CHF (congestive  heart failure) (HCC)   Leucocytosis   Dislocation of the right shoulder :  -Complaining of pain discomfort and limited range of motion,, weary of high-dose narcotics due to patient's age, confusion -Possibly secondary to fall,  syncope Status post close reduction by orthopedic team,  -Currently in a sling immobilizer, complaining of pain limited range of motion -She is right-handed -Continue pain management.  PT eval recommending SNF.    Confusion/ syncope/fall  -Patient back to baseline -no further events of confusions overnight -Neurochecks reporting no focal neurological findings or change in mental status overnight or this morning. -Unsteady gait, resulting to her falls, PT/OT still evaluating and working with the patient -Resolved, CT of the head was negative, MRI of the brain was reviewed in detail, reporting old cerebellar infarct negative for any acute infarct -Patient's mentation was at baseline this morning, was able to communicate and follow commands well. -We will continue with neurochecks, - Bilateral carotid studies -revealed no acute stenosis, 1-39% stenosis bilateral -2D echocardiogram -from 03/27/2017 was reviewed reporting lV EJF 65-70%, grade 2 dCHF -Continue monitor the patient on a monitored bed for possible erratic dysrhythmia (heart rates varies 56-79)  Pain management -PRN IV Dilaudid,  p.o. analgesics  Dehydration -Gentle IV fluid hydration -Monitoring daily weight, BUN/creatinine,   Hypertension; -stable at this time,  -We will check for orthostatic blood pressures   Chronic diastolic heart failure: -Highly dehydrated, status post 24-hour gentle IV fluid hydration -Daily weight -Denies any shortness of breath or chest pain   Leukocytosis: -Improving, no signs of acute infection, -Possible reactive -Chest x-ray UA negative -No signs of abdominal pain diarrhea or constipation -No antibiotics at this time   Mild renal  insufficiency Suspect from dehydration .  Gently hydrate for 24 hours and recheck renal parameters in am.    H/o AVR.  Resume aspirin.    Dementia:  Resume aricept.    Hyperlipidemia: resume Crestor.   DVT prophylaxis:  Lovenox.  Code Status:  Full code.  Family Communication:  Daughter  Disposition Plan: Likely SNF in a.m.  Due to patient's multiple comorbidities: Continue to be declining, status post shoulder dislocation possibly due to fall, and syncope, comorbidities of dementia, hypertension, hyperlipidemia, systolic congestive father, aortic stenosis... The patient severe debility, status post PT evaluation I am recommending skilled nursing facility for the patient she would benefit from strengthening, PT/OT, fall precautions.   Procedures:     This post close reduction of the right shoulder  Antimicrobials:  Anti-infectives (From admission, onward)   None       Medication:  . amLODipine  10 mg Oral Daily  . aspirin EC  81 mg Oral Daily  . donepezil  10 mg Oral QHS  . enoxaparin (LOVENOX) injection  30 mg Subcutaneous Q24H  . irbesartan  300 mg Oral Daily  . oxybutynin  10 mg Oral QHS  . propofol  0.5 mg/kg Intravenous Once  . propofol  0.5 mg/kg Intravenous Once  . rosuvastatin  20 mg Oral Daily    acetaminophen, HYDROcodone-acetaminophen, HYDROmorphone (DILAUDID) injection, ibuprofen, morphine injection     Objective:   Vitals:   09/27/17 1100 09/27/17 1357 09/27/17 2045 09/28/17 0500  BP:  (!) 123/44 (!) 140/51 (!) 137/59  Pulse:  72 65 68  Resp:  16 14 15   Temp:  98.5 F (36.9 C) 98.3 F (36.8 C) 98.6 F (37 C)  TempSrc:  Oral Oral Oral  SpO2:  97% 98% 95%  Weight: 71.9 kg     Height: 5\' 3"  (1.6 m)       Intake/Output Summary (Last 24 hours) at 09/28/2017 1203 Last data filed at 09/28/2017 0700 Gross per 24 hour  Intake 1680 ml  Output -  Net 1680 ml   Filed Weights   09/26/17 1800 09/26/17 1955 09/27/17 1100  Weight: 71.9  kg 71.9 kg 71.9 kg     Examination:   BP (!) 137/59 (BP Location: Left Arm)   Pulse 68   Temp 98.6 F (37 C) (Oral)   Resp 15   Ht 5\' 3"  (1.6 m)   Wt 71.9 kg   SpO2 95%   BMI 28.08 kg/m    Physical Exam  Constitution:  Alert, cooperative, no distress,  Psychiatric: Normal and stable mood and affect, cognition intact,   HEENT: Normocephalic, PERRL, otherwise with in Normal limits  Chest:Chest symmetric Cardio vascular:  S1/S2, RRR, No murmure, No Rubs or Gallops  pulmonary: Clear to auscultation bilaterally, respirations unlabored, negative wheezes / crackles Abdomen: Soft, non-tender, non-distended, bowel sounds,no masses, no organomegaly Muscular skeletal:  Right shoulder pain, in the sling, limited range of motion secondary to pain Limited exam - in bed, able to move all 4 extremities,  Normal strength,  Neuro: CNII-XII intact. , normal motor and sensation, reflexes intact  Extremities: No pitting edema lower extremities, +2 pulses, immobilized right shoulder on a sling Skin: Dry, warm to touch, negative for any Rashes, No open wounds Wounds: per nursing documentation       LABs:  CBC Latest Ref Rng & Units 09/28/2017 09/27/2017 09/25/2017  WBC 4.0 - 10.5 K/uL 10.5 13.6(H) 17.8(H)  Hemoglobin 12.0 - 15.0 g/dL 10.4(L) 11.7(L) 12.4  Hematocrit 36.0 - 46.0 % 31.8(L) 34.5(L) 38.7  Platelets 150 - 400 K/uL 278 242 286   CMP Latest Ref Rng & Units 09/25/2017 09/24/2017 05/06/2015  Glucose 70 - 99 mg/dL 127(H) 128(H) 99  BUN 8 - 23 mg/dL 24(H) 20 23(H)  Creatinine 0.44 - 1.00 mg/dL 1.09(H) 0.89 1.07(H)  Sodium 135 - 145 mmol/L 139 140 136  Potassium 3.5 - 5.1 mmol/L 4.5 3.6 3.4(L)  Chloride 98 - 111 mmol/L 105 103 102  CO2 22 - 32 mmol/L 20(L) 25 22  Calcium 8.9 - 10.3 mg/dL 9.6 10.0 9.1  Total Protein 6.5 - 8.1 g/dL - 7.9 -  Total Bilirubin 0.3 - 1.2 mg/dL - 1.5(H) -  Alkaline Phos 38 - 126 U/L - 54 -  AST 15 - 41 U/L - 27 -  ALT 0 - 44 U/L - 18 -

## 2017-09-29 DIAGNOSIS — R2689 Other abnormalities of gait and mobility: Secondary | ICD-10-CM | POA: Diagnosis not present

## 2017-09-29 DIAGNOSIS — S43004D Unspecified dislocation of right shoulder joint, subsequent encounter: Secondary | ICD-10-CM | POA: Diagnosis not present

## 2017-09-29 DIAGNOSIS — R278 Other lack of coordination: Secondary | ICD-10-CM | POA: Diagnosis not present

## 2017-09-29 DIAGNOSIS — R488 Other symbolic dysfunctions: Secondary | ICD-10-CM | POA: Diagnosis not present

## 2017-09-29 DIAGNOSIS — M6281 Muscle weakness (generalized): Secondary | ICD-10-CM | POA: Diagnosis not present

## 2017-09-29 DIAGNOSIS — I10 Essential (primary) hypertension: Secondary | ICD-10-CM | POA: Diagnosis not present

## 2017-09-29 DIAGNOSIS — Z952 Presence of prosthetic heart valve: Secondary | ICD-10-CM | POA: Diagnosis not present

## 2017-09-29 DIAGNOSIS — D72829 Elevated white blood cell count, unspecified: Secondary | ICD-10-CM | POA: Diagnosis not present

## 2017-09-29 DIAGNOSIS — F0390 Unspecified dementia without behavioral disturbance: Secondary | ICD-10-CM | POA: Diagnosis not present

## 2017-09-29 DIAGNOSIS — I5032 Chronic diastolic (congestive) heart failure: Secondary | ICD-10-CM | POA: Diagnosis not present

## 2017-09-29 DIAGNOSIS — D649 Anemia, unspecified: Secondary | ICD-10-CM | POA: Diagnosis not present

## 2017-09-29 DIAGNOSIS — R55 Syncope and collapse: Secondary | ICD-10-CM

## 2017-09-29 DIAGNOSIS — R2681 Unsteadiness on feet: Secondary | ICD-10-CM | POA: Diagnosis not present

## 2017-09-29 DIAGNOSIS — H6120 Impacted cerumen, unspecified ear: Secondary | ICD-10-CM | POA: Diagnosis not present

## 2017-09-29 DIAGNOSIS — S42251A Displaced fracture of greater tuberosity of right humerus, initial encounter for closed fracture: Secondary | ICD-10-CM | POA: Diagnosis not present

## 2017-09-29 DIAGNOSIS — E7800 Pure hypercholesterolemia, unspecified: Secondary | ICD-10-CM | POA: Diagnosis not present

## 2017-09-29 DIAGNOSIS — Z23 Encounter for immunization: Secondary | ICD-10-CM | POA: Diagnosis not present

## 2017-09-29 DIAGNOSIS — S4291XD Fracture of right shoulder girdle, part unspecified, subsequent encounter for fracture with routine healing: Secondary | ICD-10-CM | POA: Diagnosis not present

## 2017-09-29 DIAGNOSIS — M79673 Pain in unspecified foot: Secondary | ICD-10-CM | POA: Diagnosis not present

## 2017-09-29 LAB — CBC
HCT: 30 % — ABNORMAL LOW (ref 36.0–46.0)
HEMOGLOBIN: 10 g/dL — AB (ref 12.0–15.0)
MCH: 30.3 pg (ref 26.0–34.0)
MCHC: 33.3 g/dL (ref 30.0–36.0)
MCV: 90.9 fL (ref 78.0–100.0)
Platelets: 295 10*3/uL (ref 150–400)
RBC: 3.3 MIL/uL — ABNORMAL LOW (ref 3.87–5.11)
RDW: 12.7 % (ref 11.5–15.5)
WBC: 12 10*3/uL — ABNORMAL HIGH (ref 4.0–10.5)

## 2017-09-29 MED ORDER — IBUPROFEN 400 MG PO TABS: 400.0000 mg | ORAL_TABLET | Freq: Four times a day (QID) | ORAL | 0 refills | Status: DC | PRN

## 2017-09-29 NOTE — Clinical Social Work Note (Addendum)
Clinical Social Worker facilitated patient discharge including contacting patient family and facility to confirm patient discharge plans.  Clinical information faxed to facility and family agreeable with plan. Pt's daughter will come transport pt to [facility] (room 101P).  RN to call [phone] for report prior to discharge.  Clinical Social Worker will sign off for now as social work intervention is no longer needed. Please consult [place] again if new need arises.  [place], [place]

## 2017-09-29 NOTE — Discharge Summary (Signed)
Physician Discharge Summary [facility]   Patient: Megan Pitts                   Admit date: 09/24/2017   DOB: 26-Dec-1927             Discharge date:09/29/2017/9:47 AM LGX:[id number]                           PCP: Marton Redwood, MD  Recommendations for Outpatient Follow-up:   . Follow up: Follow with orthopedic team as scheduled  Discharge Condition: Stable   Code Status:   Code Status: Full Code  Diet recommendation: Cardiac diet   Discharge Diagnoses:    Principal Problem:   Fracture dislocation of right shoulder joint Active Problems:   Hypertension   Hyperlipidemia   S/P AVR   Shoulder fracture, right   Chronic diastolic CHF (congestive heart failure) (HCC)   Leucocytosis   Syncope and collapse   History of Present Illness/ Hospital Course Megan Pitts Summary:    Megan Pitts a 84 y.o.femalewithpastmedical history significantforhypertension, hyperlipidemia, diastolic CHF, aortic stenosis status post aortic valve replacement who presents from home with complaints of pain on her right shoulder. She reports she couldn't remember how she ended up with a dislocation. All she remembers is waking around 2 am, going to the restroom and sit in a recliner and unable to move her legs . She is not a great historian and detailed history is not available as she lives by herself. She appears to have a h/o dementia and initial work up to check stroke is negative with a negative CT head. Family were concerned about the events and her unable to recall events and unable to move.  MRI of the brain was obtained, revealing no acute CVA, or cerebellar infarct was identified.  Patient was seen by orthopedic team, her shoulder was reduced, placed in a sling, continues have difficulty with range of motion and use due to pain.  Due to comorbidities, severe debility, possible fall, syncope PT OT was consulted, patient was evaluated, recommended skilled nursing facility. Patient and  family has accepted, patient will be discharged to SNF for aggressive rehab -------------------------------------------------------------------------------------------------------------------------------------------------------   For detailed hospital course please see below:    Dislocation of the right shoulder :  -Complaining of pain discomfort and limited range of motion,, weary of high-dose narcotics due to patient's age, confusion -Possibly secondary to fall,  syncope Status post close reduction by orthopedic team,  -Currently in a sling immobilizer, complaining of pain limited range of motion -She is right-handed -Continue pain management.  -Benefit from skilled nursing facility, aggressive PT OT, fall precautions    Confusion/ syncope/fall  -back to baseline, no focal neurological findings -Neurochecks reporting no focal neurological findings or change in mental status overnight or this morning. -Unsteady gait, resulting to her falls, PT/OT still evaluating and working with the patient -Resolved, CT of the head was negative, MRI of the brain was reviewed in detail, reporting old cerebellar infarct negative for any acute infarct - Bilateral carotid studies -revealed no acute stenosis, 1-39% stenosis bilateral -2D echocardiogram -from 03/27/2017 was reviewed reporting lV EJF 65-70%, grade 2 dCHF -Continue monitor the patient on a monitored bed for possible erratic dysrhythmia (heart rates varies 56-79)  Pain management -PRN IV Dilaudid, may use as needed p.o. analgesics  Dehydration -resolved, responded well to IV fluid hydration  Hypertension; -table, resume home meds  Chronic diastolic heart failure:-That is  post gentle IV fluid hydration, please monitor daily weight -continue diuretics  Leukocytosis:-Improving, no signs of acute infection,-Possible reactive -Chest x-ray UA negative-No signs of abdominal pain diarrhea or constipation -No antibiotic treatment was  initiated   Mild renal insufficiency-resolved, suspect was from dehydration . Status post gentle IV fluid hydration.   H/o AVR. -Resume aspirin.   Dementia: - Resume aricept.   Hyperlipidemia: resume Crestor.   Code Status:Full code. Family Communication: Daughter  Disposition Plan: Likely SNF    Consultations: Ortho Procedures: Spontaneous reduction of shoulder   Discharge Instructions:   Discharge Instructions    Activity as tolerated - No restrictions   Complete by:  As directed    Diet - low sodium heart healthy   Complete by:  As directed    Discharge instructions   Complete by:  As directed    Fall precaution, aggressive PT/OT, follow-up with orthopedic surgeon team   Increase activity slowly   Complete by:  As directed        Medication List    STOP taking these medications   amLODipine 10 MG tablet Commonly known as:  NORVASC   hydrochlorothiazide 25 MG tablet Commonly known as:  HYDRODIURIL     TAKE these medications   acetaminophen 500 MG tablet Commonly known as:  TYLENOL Take 500 mg by mouth every 6 (six) hours as needed for mild pain.   aspirin 81 MG tablet Take 81 mg by mouth daily.   donepezil 10 MG tablet Commonly known as:  ARICEPT Take 10 mg by mouth at bedtime.   ibuprofen 400 MG tablet Commonly known as:  ADVIL,MOTRIN Take 1 tablet (400 mg total) by mouth every 6 (six) hours as needed for mild pain or moderate pain.   multivitamin tablet Take 1 tablet by mouth daily.   olmesartan 40 MG tablet Commonly known as:  BENICAR Take 40 mg by mouth daily.   oxybutynin 10 MG 24 hr tablet Commonly known as:  DITROPAN-XL Take 10 mg by mouth at bedtime.   rosuvastatin 40 MG tablet Commonly known as:  CRESTOR Take 20 mg by mouth daily.   Vitamin D (Ergocalciferol) 50000 units Caps capsule Commonly known as:  DRISDOL Take 50,000 Units by mouth every 7 (seven) days. On Sundays      Follow-up Information    [facility] Follow up.   Specialty:  Home Health Services Why:  [facility] Contact information: [address] [place] [address] [phone]          Allergies  Allergen Reactions  . Synthroid [Levothyroxine Sodium]     Pt stated not sure what happens when she takes this     Procedures /Studies:   Dg Chest 1 View  Result Date: 09/24/2017 CLINICAL DATA:  Severe right arm pain. EXAM: CHEST  1 VIEW COMPARISON:  08/04/2005 FINDINGS: There is a fracture dislocation at the right shoulder. There is a displaced fragment of the humeral head. Humeral head is anteriorly and inferiorly displaced. Heart size and pulmonary vascularity are normal. Lungs are clear. Aortic atherosclerosis. IMPRESSION: Fracture dislocation at the right glenohumeral joint as described. Aortic Atherosclerosis (ICD10-I70.0). Electronically Signed   By: Lorriane Shire M.D.   On: 09/24/2017 13:44   Dg Shoulder 1 View Right  Result Date: 09/24/2017 CLINICAL DATA:  Second reduction attempt of the right shoulder. EXAM: RIGHT SHOULDER - 1 VIEW COMPARISON:  Right shoulder radiograph 09/24/2017 at 4:01. FINDINGS: The right humerus remains  dislocated anteriorly and inferiorly. A fracture fragment from the right humerus projects lateral to the glenoid. Lung volumes are low. IMPRESSION: 1. Persistent anterior inferior right shoulder dislocation. 2. Stable fracture. Electronically Signed   By: San Morelle M.D.   On: 09/24/2017 17:12   Dg Shoulder 1 View Right  Result Date: 09/24/2017 CLINICAL DATA:  Fracture dislocation at the right shoulder. Attempted reduction. EXAM: RIGHT SHOULDER - 1 VIEW 4:01 p.m. COMPARISON:  Radiographs dated 09/24/2017 at 1:20 p.m. FINDINGS: There has been no change in the anterior dislocation and fracture as described on the prior study. IMPRESSION: Unsuccessful attempted reduction of proximal right humeral anterior dislocation. Displaced fracture of the  posterolateral aspect of the right humeral head. Electronically Signed   By: Lorriane Shire M.D.   On: 09/24/2017 16:25   Dg Shoulder Right  Result Date: 09/24/2017 CLINICAL DATA:  Sudden onset of right arm pain this morning. EXAM: RIGHT SHOULDER - 2+ VIEW COMPARISON:  None. FINDINGS: There is anterior inferior dislocation of the right glenohumeral joint. There is a comminuted fracture of the proximal right humerus with a displaced fracture involving the greater tuberosity. There is a possible nondisplaced fracture traversing the humeral neck as well, less conclusively demonstrated. No definite scapular fracture. Previous median sternotomy noted. IMPRESSION: Anteriorly dislocated right humeral head with displaced avulsion fracture of the greater tuberosity. Extension of the fracture across the right humeral neck is difficult to exclude. Electronically Signed   By: Richardean Sale M.D.   On: 09/24/2017 13:34   Ct Head Wo Contrast  Result Date: 09/24/2017 CLINICAL DATA:  Recent fall EXAM: CT HEAD WITHOUT CONTRAST CT CERVICAL SPINE WITHOUT CONTRAST TECHNIQUE: Multidetector CT imaging of the head and cervical spine was performed following the standard protocol without intravenous contrast. Multiplanar CT image reconstructions of the cervical spine were also generated. COMPARISON:  None. FINDINGS: CT HEAD FINDINGS Brain: Mild atrophic changes are identified. No findings to suggest acute hemorrhage, acute infarction or space-occupying mass lesion are noted. Vascular: No hyperdense vessel or unexpected calcification. Skull: Normal. Negative for fracture or focal lesion. Sinuses/Orbits: No acute finding. Other: None. CT CERVICAL SPINE FINDINGS Alignment: Within normal limits. Skull base and vertebrae: 7 cervical segments are well visualized. Vertebral body height is well maintained. Disc space narrowing is noted at C5-6 and C6-7. Osteophytic changes are noted. Mild facet hypertrophic changes are noted. No acute  fracture or acute facet abnormality is noted. Soft tissues and spinal canal: Surrounding soft tissues demonstrate a 16 mm hypodensity within the left lobe of the thyroid. No soft tissue mass lesion is seen. No other focal abnormality is noted. Upper chest: Within normal limits. Other: No acute abnormality noted. IMPRESSION: CT of the head: Mild atrophic changes without acute abnormality. CT of the cervical spine: Mild degenerative changes without acute abnormality. Electronically Signed   By: Inez Catalina M.D.   On: 09/24/2017 14:18   Ct Cervical Spine Wo Contrast  Result Date: 09/24/2017 CLINICAL DATA:  Recent fall EXAM: CT HEAD WITHOUT CONTRAST CT CERVICAL SPINE WITHOUT CONTRAST TECHNIQUE: Multidetector CT imaging of the head and cervical spine was performed following the standard protocol without intravenous contrast. Multiplanar CT image reconstructions of the cervical spine were also generated. COMPARISON:  None. FINDINGS: CT HEAD FINDINGS Brain: Mild atrophic changes are identified. No findings to suggest acute hemorrhage, acute infarction or space-occupying mass lesion are noted. Vascular: No hyperdense vessel or unexpected calcification. Skull: Normal. Negative for fracture or focal lesion. Sinuses/Orbits: No acute finding. Other: None. CT CERVICAL  SPINE FINDINGS Alignment: Within normal limits. Skull base and vertebrae: 7 cervical segments are well visualized. Vertebral body height is well maintained. Disc space narrowing is noted at C5-6 and C6-7. Osteophytic changes are noted. Mild facet hypertrophic changes are noted. No acute fracture or acute facet abnormality is noted. Soft tissues and spinal canal: Surrounding soft tissues demonstrate a 16 mm hypodensity within the left lobe of the thyroid. No soft tissue mass lesion is seen. No other focal abnormality is noted. Upper chest: Within normal limits. Other: No acute abnormality noted. IMPRESSION: CT of the head: Mild atrophic changes without acute  abnormality. CT of the cervical spine: Mild degenerative changes without acute abnormality. Electronically Signed   By: Inez Catalina M.D.   On: 09/24/2017 14:18   Mr Brain Wo Contrast  Result Date: 09/25/2017 CLINICAL DATA:  Acute onset confusion. EXAM: MRI HEAD WITHOUT CONTRAST TECHNIQUE: Multiplanar, multiecho pulse sequences of the brain and surrounding structures were obtained without intravenous contrast. COMPARISON:  Head CT 09/24/2017 FINDINGS: BRAIN: There is no acute infarct, acute hemorrhage or mass effect. Faint linear focus of hyperintensity within the left cerebellar hemisphere on the axial diffusion-weighted images is not corroborated on the coronal sequence and favored to be artifactual. The midline structures are normal. There are old bilateral cerebellar infarcts. The white matter signal is normal for the patient's age. There is mild generalized atrophy without lobar predilection. Susceptibility-sensitive sequences show no chronic microhemorrhage or superficial siderosis. VASCULAR: Major intracranial arterial and venous sinus flow voids are preserved. SKULL AND UPPER CERVICAL SPINE: The visualized skull base, calvarium, upper cervical spine and extracranial soft tissues are normal. SINUSES/ORBITS: No fluid levels or advanced mucosal thickening. No mastoid or middle ear effusion. The orbits are normal. IMPRESSION: 1. No acute intracranial abnormality. 2. Old bilateral cerebellar infarcts. Mild generalized atrophy without lobar predilection. Electronically Signed   By: Ulyses Jarred M.D.   On: 09/25/2017 20:05   Ct Shoulder Right Wo Contrast  Result Date: 09/24/2017 CLINICAL DATA:  Right shoulder fracture post reduction. EXAM: CT OF THE UPPER RIGHT EXTREMITY WITHOUT CONTRAST TECHNIQUE: Multidetector CT imaging of the upper right extremity was performed according to the standard protocol. COMPARISON:  Pre and post reduction radiographs of the right shoulder from earlier on the same day.  FINDINGS: Bones/Joint/Cartilage Acute comminuted fracture of the humeral head involving the greater tuberosity with slight caudal displacement of the fracture fragment by 10 mm. Tiny intra-articular fracture fragments noted posteriorly measuring 2 and 3 mm in length, series 5/37 and 39. Mild irregularity of the anterior inferior glenoid rim is in keeping with and osseous Bankart lesion, series 5/48. Degenerative disc disease of the included lower cervical spine from C5 through C7 with uncovertebral joint osteoarthritis and uncinate spurring bilaterally. Intact sternoclavicular and acromioclavicular joints. Intact included right upper ribs. Intact scapula and clavicle. Ligaments Suboptimally assessed by CT. Muscles and Tendons No intramuscular hemorrhage or atrophy. Rotator cuff tendons are suboptimally visualized on CT. Soft tissues Soft tissue swelling and edema along the proximal arm without focal fluid collection or hematoma. IMPRESSION: 1. Satisfactory reduction of previously dislocated humeral head with bony Bankart lesion along the anterior inferior glenoid rim. Comminuted greater tuberosity fracture is less displaced than on prior radiographs though still approximately 1 cm caudad from its normal position. 2. No additional fractures. Soft tissue swelling about the right shoulder. Electronically Signed   By: Ashley Royalty M.D.   On: 09/24/2017 20:04   Dg Shoulder Right Portable  Result Date: 09/24/2017 CLINICAL DATA:  Post reduction EXAM: PORTABLE RIGHT SHOULDER COMPARISON:  09/24/2017 FINDINGS: Glenohumeral alignment within normal limits on single frontal view. Slight decreased displacement comminuted greater tuberosity fracture fragment. The [facility] joint is intact. IMPRESSION: Slight decreased displacement of comminuted greater tuberosity fracture fragment. Glenohumeral alignment within normal limits on single frontal view. Electronically Signed   By: Donavan Foil M.D.   On: 09/24/2017 18:05   Dg Shoulder  Right Portable  Result Date: 09/24/2017 CLINICAL DATA:  Post reduction attempt EXAM: PORTABLE RIGHT SHOULDER COMPARISON:  09/24/2017 FINDINGS: Acute mildly comminuted and displaced fracture involving the greater tuberosity of the humerus. Improved alignment of right humeral head on frontal view but with persistent anterior subluxation on the Y-view. IMPRESSION: 1. Residual anterior displacement of the humeral head on the Y-view 2. Acute displaced and slightly comminuted fracture involving the greater tuberosity Electronically Signed   By: Donavan Foil M.D.   On: 09/24/2017 18:03   Dg Humerus Right  Result Date: 09/24/2017 CLINICAL DATA:  Right shoulder pain. EXAM: RIGHT HUMERUS - 2+ VIEW COMPARISON:  None. FINDINGS: There is a fracture dislocation at the glenohumeral joint. The humeral head is anteriorly dislocated. There is a displaced fragment of the posterior aspect of the humeral head. The remainder of the humerus is intact. IMPRESSION: Fracture dislocation at the glenohumeral joint as described. Electronically Signed   By: Lorriane Shire M.D.   On: 09/24/2017 13:45     Subjective:   Patient was seen and examined 09/29/2017, 9:47 AM Patient stable today. No acute distress.  No issues overnight Stable for discharge.  Discharge Exam:    Vitals:   09/28/17 1435 09/28/17 1924 09/29/17 0103 09/29/17 0505  BP: (!) 123/54 (!) 136/47 (!) 148/51 (!) 137/46  Pulse: 71 73 85 64  Resp: 18   16  Temp:  98.3 F (36.8 C) 99.1 F (37.3 C) 98.8 F (37.1 C)  TempSrc:  Oral Oral Oral  SpO2: 99% 96% 97% 95%  Weight:      Height:        General: Pt lying comfortably in bed & appears in no obvious distress. Cardiovascular: S1 & S2 heard, RRR, S1/S2 +. No murmurs, rubs, gallops or clicks. No JVD or pedal edema. Respiratory: Clear to auscultation without wheezing, rhonchi or crackles. No increased work of breathing. Abdominal:  Non-distended, non-tender & soft. No organomegaly or masses appreciated.  Normal bowel sounds heard. CNS: Alert and oriented. No focal deficits. Extremities: no edema, no cyanosis    The results of significant diagnostics from this hospitalization (including imaging, microbiology, ancillary and laboratory) are listed below for reference.      Microbiology:   No results found for this or any previous visit (from the past 240 hour(s)).   Labs:   CBC: Recent Labs  Lab 09/24/17 1257 09/25/17 1514 09/27/17 0832 09/28/17 0358 09/29/17 0419  WBC 20.6* 17.8* 13.6* 10.5 12.0*  NEUTROABS 18.8*  --  10.6*  --   --   HGB 13.9 12.4 11.7* 10.4* 10.0*  HCT 40.4 38.7 34.5* 31.8* 30.0*  MCV 88.2 92.6 88.7 91.4 90.9  PLT 403* 286 242 278 637   Basic Metabolic Panel: Recent Labs  Lab 09/24/17 1257 09/25/17 1514  NA 140 139  K 3.6 4.5  CL 103 105  CO2 25 20*  GLUCOSE 128* 127*  BUN 20 24*  CREATININE 0.89 1.09*  CALCIUM 10.0 9.6   Liver Function Tests: Recent Labs  Lab 09/24/17 1257  AST 27  ALT 18  ALKPHOS 54  BILITOT 1.5*  PROT 7.9  ALBUMIN 4.6   BNUrinalysis    Component Value Date/Time   COLORURINE YELLOW 09/25/2017 0133   APPEARANCEUR CLEAR 09/25/2017 0133   LABSPEC 1.019 09/25/2017 0133   PHURINE 5.0 09/25/2017 0133   GLUCOSEU NEGATIVE 09/25/2017 0133   HGBUR NEGATIVE 09/25/2017 0133   BILIRUBINUR NEGATIVE 09/25/2017 0133   KETONESUR NEGATIVE 09/25/2017 0133   PROTEINUR 30 (A) 09/25/2017 0133   NITRITE NEGATIVE 09/25/2017 0133   LEUKOCYTESUR NEGATIVE 09/25/2017 0133    Time coordinating discharge: Over 40 minutes  SIGNED: Deatra James, MD, FACP, FHM. [facility],  Pager [phone]  If 7PM-7AM, please contact night-coverage [email or web address] 09/29/2017, 9:47 AM

## 2017-09-29 NOTE — Clinical Social Work Placement (Signed)
   CLINICAL SOCIAL WORK PLACEMENT  NOTE  Date:  09/29/2017  Patient Details  Name: Megan Pitts MRN: [id number] Date of Birth: 11/15/1927  Clinical Social Work is seeking post-discharge placement for this patient at the [place] level of care (*CSW will initial, date and re-position this form in  chart as items are completed):      Patient/family provided with [facility] Work Department's list of facilities offering this level of care within the geographic area requested by the patient (or if unable, by the patient's family).  Yes   Patient/family informed of their freedom to choose among providers that offer the needed level of care, that participate in Medicare, Medicaid or managed care program needed by the patient, have an available bed and are willing to accept the patient.      Patient/family informed of [facility]'s ownership interest in [facility] and [facility], as well as of the fact that they are under no obligation to receive care at these facilities.  PASRR submitted to EDS on       PASRR number received on 09/27/17     Existing PASRR number confirmed on       FL2 transmitted to all facilities in geographic area requested by pt/family on 09/27/17     FL2 transmitted to all facilities within larger geographic area on       Patient informed that his/her managed care company has contracts with or will negotiate with certain facilities, including the following:        Yes   Patient/family informed of bed offers received.  Patient chooses bed at [facility]     Physician recommends and patient chooses bed at      Patient to be transferred to [facility] on 09/29/17.  Patient to be transferred to facility by Daughter     Patient family notified on 09/29/17 of transfer.  Name of family member notified:  Patty     PHYSICIAN Please prepare priority discharge summary, including medications, Please prepare prescriptions      Additional Comment:    _______________________________________________ Eileen Stanford, LCSW 09/29/2017, 10:02 AM

## 2017-09-29 NOTE — Progress Notes (Signed)
Hand off report given to [place], staff at [place].

## 2017-10-01 DIAGNOSIS — F0390 Unspecified dementia without behavioral disturbance: Secondary | ICD-10-CM | POA: Diagnosis not present

## 2017-10-01 DIAGNOSIS — D649 Anemia, unspecified: Secondary | ICD-10-CM | POA: Diagnosis not present

## 2017-10-01 DIAGNOSIS — D72829 Elevated white blood cell count, unspecified: Secondary | ICD-10-CM | POA: Diagnosis not present

## 2017-10-01 DIAGNOSIS — S43004D Unspecified dislocation of right shoulder joint, subsequent encounter: Secondary | ICD-10-CM | POA: Diagnosis not present

## 2017-10-04 ENCOUNTER — Other Ambulatory Visit: Payer: Self-pay | Admitting: Orthopedic Surgery

## 2017-10-04 DIAGNOSIS — M25511 Pain in right shoulder: Secondary | ICD-10-CM

## 2017-10-12 ENCOUNTER — Ambulatory Visit
Admission: RE | Admit: 2017-10-12 | Discharge: 2017-10-12 | Disposition: A | Discharge status: Home / Self Care | Payer: Medicare Other | Source: Ambulatory Visit | Attending: Orthopedic Surgery | Admitting: Orthopedic Surgery

## 2017-10-12 DIAGNOSIS — M25511 Pain in right shoulder: Secondary | ICD-10-CM

## 2017-10-12 DIAGNOSIS — S42251A Displaced fracture of greater tuberosity of right humerus, initial encounter for closed fracture: Secondary | ICD-10-CM | POA: Diagnosis not present

## 2017-10-15 DIAGNOSIS — I10 Essential (primary) hypertension: Secondary | ICD-10-CM | POA: Diagnosis not present

## 2017-10-15 DIAGNOSIS — M79673 Pain in unspecified foot: Secondary | ICD-10-CM | POA: Diagnosis not present

## 2017-10-18 DIAGNOSIS — F0390 Unspecified dementia without behavioral disturbance: Secondary | ICD-10-CM | POA: Diagnosis not present

## 2017-10-18 DIAGNOSIS — I5032 Chronic diastolic (congestive) heart failure: Secondary | ICD-10-CM | POA: Diagnosis not present

## 2017-10-18 DIAGNOSIS — I10 Essential (primary) hypertension: Secondary | ICD-10-CM | POA: Diagnosis not present

## 2017-10-23 DIAGNOSIS — S43004D Unspecified dislocation of right shoulder joint, subsequent encounter: Secondary | ICD-10-CM | POA: Diagnosis not present

## 2017-10-23 DIAGNOSIS — I5032 Chronic diastolic (congestive) heart failure: Secondary | ICD-10-CM | POA: Diagnosis not present

## 2017-10-23 DIAGNOSIS — F0390 Unspecified dementia without behavioral disturbance: Secondary | ICD-10-CM | POA: Diagnosis not present

## 2017-10-23 DIAGNOSIS — I10 Essential (primary) hypertension: Secondary | ICD-10-CM | POA: Diagnosis not present

## 2017-10-26 DIAGNOSIS — I10 Essential (primary) hypertension: Secondary | ICD-10-CM | POA: Diagnosis not present

## 2017-10-26 DIAGNOSIS — H6120 Impacted cerumen, unspecified ear: Secondary | ICD-10-CM | POA: Diagnosis not present

## 2017-11-01 DIAGNOSIS — I10 Essential (primary) hypertension: Secondary | ICD-10-CM | POA: Diagnosis not present

## 2017-11-01 DIAGNOSIS — H6120 Impacted cerumen, unspecified ear: Secondary | ICD-10-CM | POA: Diagnosis not present

## 2017-11-06 DIAGNOSIS — I10 Essential (primary) hypertension: Secondary | ICD-10-CM | POA: Diagnosis not present

## 2017-11-06 DIAGNOSIS — F0390 Unspecified dementia without behavioral disturbance: Secondary | ICD-10-CM | POA: Diagnosis not present

## 2017-11-06 DIAGNOSIS — I5032 Chronic diastolic (congestive) heart failure: Secondary | ICD-10-CM | POA: Diagnosis not present

## 2017-11-06 DIAGNOSIS — D649 Anemia, unspecified: Secondary | ICD-10-CM | POA: Diagnosis not present

## 2017-11-09 DIAGNOSIS — R278 Other lack of coordination: Secondary | ICD-10-CM | POA: Diagnosis not present

## 2017-11-09 DIAGNOSIS — R55 Syncope and collapse: Secondary | ICD-10-CM | POA: Diagnosis not present

## 2017-11-09 DIAGNOSIS — R2689 Other abnormalities of gait and mobility: Secondary | ICD-10-CM | POA: Diagnosis not present

## 2017-11-09 DIAGNOSIS — I10 Essential (primary) hypertension: Secondary | ICD-10-CM | POA: Diagnosis not present

## 2017-11-09 DIAGNOSIS — S4291XD Fracture of right shoulder girdle, part unspecified, subsequent encounter for fracture with routine healing: Secondary | ICD-10-CM | POA: Diagnosis not present

## 2017-11-09 DIAGNOSIS — F0390 Unspecified dementia without behavioral disturbance: Secondary | ICD-10-CM | POA: Diagnosis not present

## 2017-11-09 DIAGNOSIS — D649 Anemia, unspecified: Secondary | ICD-10-CM | POA: Diagnosis not present

## 2017-11-09 DIAGNOSIS — S42251A Displaced fracture of greater tuberosity of right humerus, initial encounter for closed fracture: Secondary | ICD-10-CM | POA: Diagnosis not present

## 2017-11-09 DIAGNOSIS — R488 Other symbolic dysfunctions: Secondary | ICD-10-CM | POA: Diagnosis not present

## 2017-11-09 DIAGNOSIS — M6281 Muscle weakness (generalized): Secondary | ICD-10-CM | POA: Diagnosis not present

## 2017-11-09 DIAGNOSIS — R2681 Unsteadiness on feet: Secondary | ICD-10-CM | POA: Diagnosis not present

## 2017-11-09 DIAGNOSIS — I5032 Chronic diastolic (congestive) heart failure: Secondary | ICD-10-CM | POA: Diagnosis not present

## 2017-11-12 DIAGNOSIS — I10 Essential (primary) hypertension: Secondary | ICD-10-CM | POA: Diagnosis not present

## 2017-11-13 DIAGNOSIS — S42251A Displaced fracture of greater tuberosity of right humerus, initial encounter for closed fracture: Secondary | ICD-10-CM | POA: Diagnosis not present

## 2017-11-16 DIAGNOSIS — I5032 Chronic diastolic (congestive) heart failure: Secondary | ICD-10-CM | POA: Diagnosis not present

## 2017-11-16 DIAGNOSIS — I10 Essential (primary) hypertension: Secondary | ICD-10-CM | POA: Diagnosis not present

## 2017-11-16 DIAGNOSIS — F0390 Unspecified dementia without behavioral disturbance: Secondary | ICD-10-CM | POA: Diagnosis not present

## 2017-11-23 DIAGNOSIS — I5032 Chronic diastolic (congestive) heart failure: Secondary | ICD-10-CM | POA: Diagnosis not present

## 2017-11-23 DIAGNOSIS — D649 Anemia, unspecified: Secondary | ICD-10-CM | POA: Diagnosis not present

## 2017-11-23 DIAGNOSIS — I10 Essential (primary) hypertension: Secondary | ICD-10-CM | POA: Diagnosis not present

## 2017-11-23 DIAGNOSIS — F0390 Unspecified dementia without behavioral disturbance: Secondary | ICD-10-CM | POA: Diagnosis not present

## 2017-11-25 DIAGNOSIS — R7301 Impaired fasting glucose: Secondary | ICD-10-CM | POA: Diagnosis not present

## 2017-11-25 DIAGNOSIS — I110 Hypertensive heart disease with heart failure: Secondary | ICD-10-CM | POA: Diagnosis not present

## 2017-11-25 DIAGNOSIS — F0280 Dementia in other diseases classified elsewhere without behavioral disturbance: Secondary | ICD-10-CM | POA: Diagnosis not present

## 2017-11-25 DIAGNOSIS — I5032 Chronic diastolic (congestive) heart failure: Secondary | ICD-10-CM | POA: Diagnosis not present

## 2017-11-25 DIAGNOSIS — E038 Other specified hypothyroidism: Secondary | ICD-10-CM | POA: Diagnosis not present

## 2017-11-25 DIAGNOSIS — N3946 Mixed incontinence: Secondary | ICD-10-CM | POA: Diagnosis not present

## 2017-11-25 DIAGNOSIS — Z7982 Long term (current) use of aspirin: Secondary | ICD-10-CM | POA: Diagnosis not present

## 2017-11-25 DIAGNOSIS — G6289 Other specified polyneuropathies: Secondary | ICD-10-CM | POA: Diagnosis not present

## 2017-11-25 DIAGNOSIS — E785 Hyperlipidemia, unspecified: Secondary | ICD-10-CM | POA: Diagnosis not present

## 2017-11-25 DIAGNOSIS — M859 Disorder of bone density and structure, unspecified: Secondary | ICD-10-CM | POA: Diagnosis not present

## 2017-11-25 DIAGNOSIS — G629 Polyneuropathy, unspecified: Secondary | ICD-10-CM | POA: Diagnosis not present

## 2017-11-25 DIAGNOSIS — W19XXXD Unspecified fall, subsequent encounter: Secondary | ICD-10-CM | POA: Diagnosis not present

## 2017-11-25 DIAGNOSIS — M80021D Age-related osteoporosis with current pathological fracture, right humerus, subsequent encounter for fracture with routine healing: Secondary | ICD-10-CM | POA: Diagnosis not present

## 2017-11-25 DIAGNOSIS — Z9181 History of falling: Secondary | ICD-10-CM | POA: Diagnosis not present

## 2017-11-25 DIAGNOSIS — Z952 Presence of prosthetic heart valve: Secondary | ICD-10-CM | POA: Diagnosis not present

## 2017-11-25 DIAGNOSIS — N3281 Overactive bladder: Secondary | ICD-10-CM | POA: Diagnosis not present

## 2017-11-25 DIAGNOSIS — S43014D Anterior dislocation of right humerus, subsequent encounter: Secondary | ICD-10-CM | POA: Diagnosis not present

## 2017-11-25 DIAGNOSIS — I700 Atherosclerosis of aorta: Secondary | ICD-10-CM | POA: Diagnosis not present

## 2017-11-25 DIAGNOSIS — M8580 Other specified disorders of bone density and structure, unspecified site: Secondary | ICD-10-CM | POA: Diagnosis not present

## 2017-11-25 DIAGNOSIS — K219 Gastro-esophageal reflux disease without esophagitis: Secondary | ICD-10-CM | POA: Diagnosis not present

## 2017-11-25 DIAGNOSIS — D649 Anemia, unspecified: Secondary | ICD-10-CM | POA: Diagnosis not present

## 2017-11-25 DIAGNOSIS — E039 Hypothyroidism, unspecified: Secondary | ICD-10-CM | POA: Diagnosis not present

## 2017-11-26 DIAGNOSIS — N3946 Mixed incontinence: Secondary | ICD-10-CM | POA: Diagnosis not present

## 2017-11-26 DIAGNOSIS — M80021D Age-related osteoporosis with current pathological fracture, right humerus, subsequent encounter for fracture with routine healing: Secondary | ICD-10-CM | POA: Diagnosis not present

## 2017-11-26 DIAGNOSIS — G629 Polyneuropathy, unspecified: Secondary | ICD-10-CM | POA: Diagnosis not present

## 2017-11-26 DIAGNOSIS — I110 Hypertensive heart disease with heart failure: Secondary | ICD-10-CM | POA: Diagnosis not present

## 2017-11-26 DIAGNOSIS — Z6827 Body mass index (BMI) 27.0-27.9, adult: Secondary | ICD-10-CM | POA: Diagnosis not present

## 2017-11-26 DIAGNOSIS — I10 Essential (primary) hypertension: Secondary | ICD-10-CM | POA: Diagnosis not present

## 2017-11-26 DIAGNOSIS — E039 Hypothyroidism, unspecified: Secondary | ICD-10-CM | POA: Diagnosis not present

## 2017-11-26 DIAGNOSIS — M80021S Age-related osteoporosis with current pathological fracture, right humerus, sequela: Secondary | ICD-10-CM | POA: Diagnosis not present

## 2017-11-26 DIAGNOSIS — I5032 Chronic diastolic (congestive) heart failure: Secondary | ICD-10-CM | POA: Diagnosis not present

## 2017-11-26 DIAGNOSIS — S43014D Anterior dislocation of right humerus, subsequent encounter: Secondary | ICD-10-CM | POA: Diagnosis not present

## 2017-11-26 DIAGNOSIS — W19XXXA Unspecified fall, initial encounter: Secondary | ICD-10-CM | POA: Diagnosis not present

## 2017-11-28 DIAGNOSIS — I5032 Chronic diastolic (congestive) heart failure: Secondary | ICD-10-CM | POA: Diagnosis not present

## 2017-11-28 DIAGNOSIS — S43014D Anterior dislocation of right humerus, subsequent encounter: Secondary | ICD-10-CM | POA: Diagnosis not present

## 2017-11-28 DIAGNOSIS — E039 Hypothyroidism, unspecified: Secondary | ICD-10-CM | POA: Diagnosis not present

## 2017-11-28 DIAGNOSIS — G629 Polyneuropathy, unspecified: Secondary | ICD-10-CM | POA: Diagnosis not present

## 2017-11-28 DIAGNOSIS — M80021D Age-related osteoporosis with current pathological fracture, right humerus, subsequent encounter for fracture with routine healing: Secondary | ICD-10-CM | POA: Diagnosis not present

## 2017-11-28 DIAGNOSIS — I110 Hypertensive heart disease with heart failure: Secondary | ICD-10-CM | POA: Diagnosis not present

## 2017-11-29 DIAGNOSIS — I110 Hypertensive heart disease with heart failure: Secondary | ICD-10-CM | POA: Diagnosis not present

## 2017-11-29 DIAGNOSIS — I5032 Chronic diastolic (congestive) heart failure: Secondary | ICD-10-CM | POA: Diagnosis not present

## 2017-11-29 DIAGNOSIS — G629 Polyneuropathy, unspecified: Secondary | ICD-10-CM | POA: Diagnosis not present

## 2017-11-29 DIAGNOSIS — E039 Hypothyroidism, unspecified: Secondary | ICD-10-CM | POA: Diagnosis not present

## 2017-11-29 DIAGNOSIS — S43014D Anterior dislocation of right humerus, subsequent encounter: Secondary | ICD-10-CM | POA: Diagnosis not present

## 2017-11-29 DIAGNOSIS — M80021D Age-related osteoporosis with current pathological fracture, right humerus, subsequent encounter for fracture with routine healing: Secondary | ICD-10-CM | POA: Diagnosis not present

## 2017-12-03 DIAGNOSIS — E039 Hypothyroidism, unspecified: Secondary | ICD-10-CM | POA: Diagnosis not present

## 2017-12-03 DIAGNOSIS — G629 Polyneuropathy, unspecified: Secondary | ICD-10-CM | POA: Diagnosis not present

## 2017-12-03 DIAGNOSIS — I5032 Chronic diastolic (congestive) heart failure: Secondary | ICD-10-CM | POA: Diagnosis not present

## 2017-12-03 DIAGNOSIS — I110 Hypertensive heart disease with heart failure: Secondary | ICD-10-CM | POA: Diagnosis not present

## 2017-12-03 DIAGNOSIS — S43014D Anterior dislocation of right humerus, subsequent encounter: Secondary | ICD-10-CM | POA: Diagnosis not present

## 2017-12-03 DIAGNOSIS — M80021D Age-related osteoporosis with current pathological fracture, right humerus, subsequent encounter for fracture with routine healing: Secondary | ICD-10-CM | POA: Diagnosis not present

## 2017-12-04 DIAGNOSIS — I110 Hypertensive heart disease with heart failure: Secondary | ICD-10-CM | POA: Diagnosis not present

## 2017-12-04 DIAGNOSIS — G629 Polyneuropathy, unspecified: Secondary | ICD-10-CM | POA: Diagnosis not present

## 2017-12-04 DIAGNOSIS — M80021D Age-related osteoporosis with current pathological fracture, right humerus, subsequent encounter for fracture with routine healing: Secondary | ICD-10-CM | POA: Diagnosis not present

## 2017-12-04 DIAGNOSIS — I5032 Chronic diastolic (congestive) heart failure: Secondary | ICD-10-CM | POA: Diagnosis not present

## 2017-12-04 DIAGNOSIS — S43014D Anterior dislocation of right humerus, subsequent encounter: Secondary | ICD-10-CM | POA: Diagnosis not present

## 2017-12-04 DIAGNOSIS — E039 Hypothyroidism, unspecified: Secondary | ICD-10-CM | POA: Diagnosis not present

## 2017-12-10 DIAGNOSIS — I110 Hypertensive heart disease with heart failure: Secondary | ICD-10-CM | POA: Diagnosis not present

## 2017-12-10 DIAGNOSIS — I5032 Chronic diastolic (congestive) heart failure: Secondary | ICD-10-CM | POA: Diagnosis not present

## 2017-12-10 DIAGNOSIS — S43014D Anterior dislocation of right humerus, subsequent encounter: Secondary | ICD-10-CM | POA: Diagnosis not present

## 2017-12-10 DIAGNOSIS — M80021D Age-related osteoporosis with current pathological fracture, right humerus, subsequent encounter for fracture with routine healing: Secondary | ICD-10-CM | POA: Diagnosis not present

## 2017-12-10 DIAGNOSIS — E039 Hypothyroidism, unspecified: Secondary | ICD-10-CM | POA: Diagnosis not present

## 2017-12-10 DIAGNOSIS — G629 Polyneuropathy, unspecified: Secondary | ICD-10-CM | POA: Diagnosis not present

## 2017-12-11 DIAGNOSIS — I5032 Chronic diastolic (congestive) heart failure: Secondary | ICD-10-CM | POA: Diagnosis not present

## 2017-12-11 DIAGNOSIS — M80021D Age-related osteoporosis with current pathological fracture, right humerus, subsequent encounter for fracture with routine healing: Secondary | ICD-10-CM | POA: Diagnosis not present

## 2017-12-11 DIAGNOSIS — E039 Hypothyroidism, unspecified: Secondary | ICD-10-CM | POA: Diagnosis not present

## 2017-12-11 DIAGNOSIS — G629 Polyneuropathy, unspecified: Secondary | ICD-10-CM | POA: Diagnosis not present

## 2017-12-11 DIAGNOSIS — S43014D Anterior dislocation of right humerus, subsequent encounter: Secondary | ICD-10-CM | POA: Diagnosis not present

## 2017-12-11 DIAGNOSIS — I110 Hypertensive heart disease with heart failure: Secondary | ICD-10-CM | POA: Diagnosis not present

## 2017-12-12 DIAGNOSIS — I5032 Chronic diastolic (congestive) heart failure: Secondary | ICD-10-CM | POA: Diagnosis not present

## 2017-12-12 DIAGNOSIS — S43014D Anterior dislocation of right humerus, subsequent encounter: Secondary | ICD-10-CM | POA: Diagnosis not present

## 2017-12-12 DIAGNOSIS — G629 Polyneuropathy, unspecified: Secondary | ICD-10-CM | POA: Diagnosis not present

## 2017-12-12 DIAGNOSIS — I110 Hypertensive heart disease with heart failure: Secondary | ICD-10-CM | POA: Diagnosis not present

## 2017-12-12 DIAGNOSIS — E039 Hypothyroidism, unspecified: Secondary | ICD-10-CM | POA: Diagnosis not present

## 2017-12-12 DIAGNOSIS — M80021D Age-related osteoporosis with current pathological fracture, right humerus, subsequent encounter for fracture with routine healing: Secondary | ICD-10-CM | POA: Diagnosis not present

## 2017-12-13 DIAGNOSIS — I5032 Chronic diastolic (congestive) heart failure: Secondary | ICD-10-CM | POA: Diagnosis not present

## 2017-12-13 DIAGNOSIS — M80021D Age-related osteoporosis with current pathological fracture, right humerus, subsequent encounter for fracture with routine healing: Secondary | ICD-10-CM | POA: Diagnosis not present

## 2017-12-13 DIAGNOSIS — S43014D Anterior dislocation of right humerus, subsequent encounter: Secondary | ICD-10-CM | POA: Diagnosis not present

## 2017-12-13 DIAGNOSIS — E039 Hypothyroidism, unspecified: Secondary | ICD-10-CM | POA: Diagnosis not present

## 2017-12-13 DIAGNOSIS — I110 Hypertensive heart disease with heart failure: Secondary | ICD-10-CM | POA: Diagnosis not present

## 2017-12-13 DIAGNOSIS — G629 Polyneuropathy, unspecified: Secondary | ICD-10-CM | POA: Diagnosis not present

## 2017-12-19 DIAGNOSIS — E039 Hypothyroidism, unspecified: Secondary | ICD-10-CM | POA: Diagnosis not present

## 2017-12-19 DIAGNOSIS — G629 Polyneuropathy, unspecified: Secondary | ICD-10-CM | POA: Diagnosis not present

## 2017-12-19 DIAGNOSIS — S43014D Anterior dislocation of right humerus, subsequent encounter: Secondary | ICD-10-CM | POA: Diagnosis not present

## 2017-12-19 DIAGNOSIS — M80021D Age-related osteoporosis with current pathological fracture, right humerus, subsequent encounter for fracture with routine healing: Secondary | ICD-10-CM | POA: Diagnosis not present

## 2017-12-19 DIAGNOSIS — I5032 Chronic diastolic (congestive) heart failure: Secondary | ICD-10-CM | POA: Diagnosis not present

## 2017-12-19 DIAGNOSIS — I110 Hypertensive heart disease with heart failure: Secondary | ICD-10-CM | POA: Diagnosis not present

## 2017-12-20 DIAGNOSIS — I5032 Chronic diastolic (congestive) heart failure: Secondary | ICD-10-CM | POA: Diagnosis not present

## 2017-12-20 DIAGNOSIS — E039 Hypothyroidism, unspecified: Secondary | ICD-10-CM | POA: Diagnosis not present

## 2017-12-20 DIAGNOSIS — M80021D Age-related osteoporosis with current pathological fracture, right humerus, subsequent encounter for fracture with routine healing: Secondary | ICD-10-CM | POA: Diagnosis not present

## 2017-12-20 DIAGNOSIS — S43014D Anterior dislocation of right humerus, subsequent encounter: Secondary | ICD-10-CM | POA: Diagnosis not present

## 2017-12-20 DIAGNOSIS — G629 Polyneuropathy, unspecified: Secondary | ICD-10-CM | POA: Diagnosis not present

## 2017-12-20 DIAGNOSIS — I110 Hypertensive heart disease with heart failure: Secondary | ICD-10-CM | POA: Diagnosis not present

## 2017-12-25 DIAGNOSIS — E039 Hypothyroidism, unspecified: Secondary | ICD-10-CM | POA: Diagnosis not present

## 2017-12-25 DIAGNOSIS — M80021D Age-related osteoporosis with current pathological fracture, right humerus, subsequent encounter for fracture with routine healing: Secondary | ICD-10-CM | POA: Diagnosis not present

## 2017-12-25 DIAGNOSIS — I110 Hypertensive heart disease with heart failure: Secondary | ICD-10-CM | POA: Diagnosis not present

## 2017-12-25 DIAGNOSIS — G629 Polyneuropathy, unspecified: Secondary | ICD-10-CM | POA: Diagnosis not present

## 2017-12-25 DIAGNOSIS — I5032 Chronic diastolic (congestive) heart failure: Secondary | ICD-10-CM | POA: Diagnosis not present

## 2017-12-25 DIAGNOSIS — S43014D Anterior dislocation of right humerus, subsequent encounter: Secondary | ICD-10-CM | POA: Diagnosis not present

## 2017-12-25 DIAGNOSIS — S42251A Displaced fracture of greater tuberosity of right humerus, initial encounter for closed fracture: Secondary | ICD-10-CM | POA: Diagnosis not present

## 2017-12-31 DIAGNOSIS — G629 Polyneuropathy, unspecified: Secondary | ICD-10-CM | POA: Diagnosis not present

## 2017-12-31 DIAGNOSIS — I110 Hypertensive heart disease with heart failure: Secondary | ICD-10-CM | POA: Diagnosis not present

## 2017-12-31 DIAGNOSIS — E039 Hypothyroidism, unspecified: Secondary | ICD-10-CM | POA: Diagnosis not present

## 2017-12-31 DIAGNOSIS — I5032 Chronic diastolic (congestive) heart failure: Secondary | ICD-10-CM | POA: Diagnosis not present

## 2017-12-31 DIAGNOSIS — S43014D Anterior dislocation of right humerus, subsequent encounter: Secondary | ICD-10-CM | POA: Diagnosis not present

## 2017-12-31 DIAGNOSIS — M80021D Age-related osteoporosis with current pathological fracture, right humerus, subsequent encounter for fracture with routine healing: Secondary | ICD-10-CM | POA: Diagnosis not present

## 2018-01-03 DIAGNOSIS — I110 Hypertensive heart disease with heart failure: Secondary | ICD-10-CM | POA: Diagnosis not present

## 2018-01-03 DIAGNOSIS — E039 Hypothyroidism, unspecified: Secondary | ICD-10-CM | POA: Diagnosis not present

## 2018-01-03 DIAGNOSIS — S43014D Anterior dislocation of right humerus, subsequent encounter: Secondary | ICD-10-CM | POA: Diagnosis not present

## 2018-01-03 DIAGNOSIS — I5032 Chronic diastolic (congestive) heart failure: Secondary | ICD-10-CM | POA: Diagnosis not present

## 2018-01-03 DIAGNOSIS — G629 Polyneuropathy, unspecified: Secondary | ICD-10-CM | POA: Diagnosis not present

## 2018-01-03 DIAGNOSIS — M80021D Age-related osteoporosis with current pathological fracture, right humerus, subsequent encounter for fracture with routine healing: Secondary | ICD-10-CM | POA: Diagnosis not present

## 2018-01-07 DIAGNOSIS — E039 Hypothyroidism, unspecified: Secondary | ICD-10-CM | POA: Diagnosis not present

## 2018-01-07 DIAGNOSIS — I5032 Chronic diastolic (congestive) heart failure: Secondary | ICD-10-CM | POA: Diagnosis not present

## 2018-01-07 DIAGNOSIS — G629 Polyneuropathy, unspecified: Secondary | ICD-10-CM | POA: Diagnosis not present

## 2018-01-07 DIAGNOSIS — S43014D Anterior dislocation of right humerus, subsequent encounter: Secondary | ICD-10-CM | POA: Diagnosis not present

## 2018-01-07 DIAGNOSIS — M80021D Age-related osteoporosis with current pathological fracture, right humerus, subsequent encounter for fracture with routine healing: Secondary | ICD-10-CM | POA: Diagnosis not present

## 2018-01-07 DIAGNOSIS — I110 Hypertensive heart disease with heart failure: Secondary | ICD-10-CM | POA: Diagnosis not present

## 2018-01-10 DIAGNOSIS — S43014D Anterior dislocation of right humerus, subsequent encounter: Secondary | ICD-10-CM | POA: Diagnosis not present

## 2018-01-10 DIAGNOSIS — I5032 Chronic diastolic (congestive) heart failure: Secondary | ICD-10-CM | POA: Diagnosis not present

## 2018-01-10 DIAGNOSIS — G629 Polyneuropathy, unspecified: Secondary | ICD-10-CM | POA: Diagnosis not present

## 2018-01-10 DIAGNOSIS — M80021D Age-related osteoporosis with current pathological fracture, right humerus, subsequent encounter for fracture with routine healing: Secondary | ICD-10-CM | POA: Diagnosis not present

## 2018-01-10 DIAGNOSIS — I110 Hypertensive heart disease with heart failure: Secondary | ICD-10-CM | POA: Diagnosis not present

## 2018-01-10 DIAGNOSIS — E039 Hypothyroidism, unspecified: Secondary | ICD-10-CM | POA: Diagnosis not present

## 2018-01-11 DIAGNOSIS — I110 Hypertensive heart disease with heart failure: Secondary | ICD-10-CM | POA: Diagnosis not present

## 2018-01-11 DIAGNOSIS — M80021D Age-related osteoporosis with current pathological fracture, right humerus, subsequent encounter for fracture with routine healing: Secondary | ICD-10-CM | POA: Diagnosis not present

## 2018-01-11 DIAGNOSIS — Z6828 Body mass index (BMI) 28.0-28.9, adult: Secondary | ICD-10-CM | POA: Diagnosis not present

## 2018-01-11 DIAGNOSIS — E039 Hypothyroidism, unspecified: Secondary | ICD-10-CM | POA: Diagnosis not present

## 2018-01-11 DIAGNOSIS — G629 Polyneuropathy, unspecified: Secondary | ICD-10-CM | POA: Diagnosis not present

## 2018-01-11 DIAGNOSIS — M810 Age-related osteoporosis without current pathological fracture: Secondary | ICD-10-CM | POA: Diagnosis not present

## 2018-01-11 DIAGNOSIS — I5032 Chronic diastolic (congestive) heart failure: Secondary | ICD-10-CM | POA: Diagnosis not present

## 2018-01-11 DIAGNOSIS — I10 Essential (primary) hypertension: Secondary | ICD-10-CM | POA: Diagnosis not present

## 2018-01-11 DIAGNOSIS — S43014D Anterior dislocation of right humerus, subsequent encounter: Secondary | ICD-10-CM | POA: Diagnosis not present

## 2018-01-14 DIAGNOSIS — S43014D Anterior dislocation of right humerus, subsequent encounter: Secondary | ICD-10-CM | POA: Diagnosis not present

## 2018-01-14 DIAGNOSIS — M80021D Age-related osteoporosis with current pathological fracture, right humerus, subsequent encounter for fracture with routine healing: Secondary | ICD-10-CM | POA: Diagnosis not present

## 2018-01-14 DIAGNOSIS — E039 Hypothyroidism, unspecified: Secondary | ICD-10-CM | POA: Diagnosis not present

## 2018-01-14 DIAGNOSIS — I110 Hypertensive heart disease with heart failure: Secondary | ICD-10-CM | POA: Diagnosis not present

## 2018-01-14 DIAGNOSIS — I5032 Chronic diastolic (congestive) heart failure: Secondary | ICD-10-CM | POA: Diagnosis not present

## 2018-01-14 DIAGNOSIS — G629 Polyneuropathy, unspecified: Secondary | ICD-10-CM | POA: Diagnosis not present

## 2018-01-17 DIAGNOSIS — S43014D Anterior dislocation of right humerus, subsequent encounter: Secondary | ICD-10-CM | POA: Diagnosis not present

## 2018-01-17 DIAGNOSIS — I110 Hypertensive heart disease with heart failure: Secondary | ICD-10-CM | POA: Diagnosis not present

## 2018-01-17 DIAGNOSIS — E039 Hypothyroidism, unspecified: Secondary | ICD-10-CM | POA: Diagnosis not present

## 2018-01-17 DIAGNOSIS — I5032 Chronic diastolic (congestive) heart failure: Secondary | ICD-10-CM | POA: Diagnosis not present

## 2018-01-17 DIAGNOSIS — G629 Polyneuropathy, unspecified: Secondary | ICD-10-CM | POA: Diagnosis not present

## 2018-01-17 DIAGNOSIS — M80021D Age-related osteoporosis with current pathological fracture, right humerus, subsequent encounter for fracture with routine healing: Secondary | ICD-10-CM | POA: Diagnosis not present

## 2018-01-22 DIAGNOSIS — E039 Hypothyroidism, unspecified: Secondary | ICD-10-CM | POA: Diagnosis not present

## 2018-01-22 DIAGNOSIS — I110 Hypertensive heart disease with heart failure: Secondary | ICD-10-CM | POA: Diagnosis not present

## 2018-01-22 DIAGNOSIS — M80021D Age-related osteoporosis with current pathological fracture, right humerus, subsequent encounter for fracture with routine healing: Secondary | ICD-10-CM | POA: Diagnosis not present

## 2018-01-22 DIAGNOSIS — I5032 Chronic diastolic (congestive) heart failure: Secondary | ICD-10-CM | POA: Diagnosis not present

## 2018-01-22 DIAGNOSIS — S43014D Anterior dislocation of right humerus, subsequent encounter: Secondary | ICD-10-CM | POA: Diagnosis not present

## 2018-01-22 DIAGNOSIS — G629 Polyneuropathy, unspecified: Secondary | ICD-10-CM | POA: Diagnosis not present

## 2018-01-24 DIAGNOSIS — E039 Hypothyroidism, unspecified: Secondary | ICD-10-CM | POA: Diagnosis not present

## 2018-01-24 DIAGNOSIS — I700 Atherosclerosis of aorta: Secondary | ICD-10-CM | POA: Diagnosis not present

## 2018-01-24 DIAGNOSIS — Z952 Presence of prosthetic heart valve: Secondary | ICD-10-CM | POA: Diagnosis not present

## 2018-01-24 DIAGNOSIS — G6289 Other specified polyneuropathies: Secondary | ICD-10-CM | POA: Diagnosis not present

## 2018-01-24 DIAGNOSIS — G629 Polyneuropathy, unspecified: Secondary | ICD-10-CM | POA: Diagnosis not present

## 2018-01-24 DIAGNOSIS — N3281 Overactive bladder: Secondary | ICD-10-CM | POA: Diagnosis not present

## 2018-01-24 DIAGNOSIS — S43014D Anterior dislocation of right humerus, subsequent encounter: Secondary | ICD-10-CM | POA: Diagnosis not present

## 2018-01-24 DIAGNOSIS — D649 Anemia, unspecified: Secondary | ICD-10-CM | POA: Diagnosis not present

## 2018-01-24 DIAGNOSIS — Z7982 Long term (current) use of aspirin: Secondary | ICD-10-CM | POA: Diagnosis not present

## 2018-01-24 DIAGNOSIS — F0280 Dementia in other diseases classified elsewhere without behavioral disturbance: Secondary | ICD-10-CM | POA: Diagnosis not present

## 2018-01-24 DIAGNOSIS — E785 Hyperlipidemia, unspecified: Secondary | ICD-10-CM | POA: Diagnosis not present

## 2018-01-24 DIAGNOSIS — M859 Disorder of bone density and structure, unspecified: Secondary | ICD-10-CM | POA: Diagnosis not present

## 2018-01-24 DIAGNOSIS — I110 Hypertensive heart disease with heart failure: Secondary | ICD-10-CM | POA: Diagnosis not present

## 2018-01-24 DIAGNOSIS — M8580 Other specified disorders of bone density and structure, unspecified site: Secondary | ICD-10-CM | POA: Diagnosis not present

## 2018-01-24 DIAGNOSIS — I5032 Chronic diastolic (congestive) heart failure: Secondary | ICD-10-CM | POA: Diagnosis not present

## 2018-01-24 DIAGNOSIS — M80021D Age-related osteoporosis with current pathological fracture, right humerus, subsequent encounter for fracture with routine healing: Secondary | ICD-10-CM | POA: Diagnosis not present

## 2018-01-24 DIAGNOSIS — K219 Gastro-esophageal reflux disease without esophagitis: Secondary | ICD-10-CM | POA: Diagnosis not present

## 2018-01-24 DIAGNOSIS — R7301 Impaired fasting glucose: Secondary | ICD-10-CM | POA: Diagnosis not present

## 2018-01-24 DIAGNOSIS — Z9181 History of falling: Secondary | ICD-10-CM | POA: Diagnosis not present

## 2018-01-24 DIAGNOSIS — W19XXXD Unspecified fall, subsequent encounter: Secondary | ICD-10-CM | POA: Diagnosis not present

## 2018-01-24 DIAGNOSIS — Z79891 Long term (current) use of opiate analgesic: Secondary | ICD-10-CM | POA: Diagnosis not present

## 2018-01-24 DIAGNOSIS — E038 Other specified hypothyroidism: Secondary | ICD-10-CM | POA: Diagnosis not present

## 2018-01-24 DIAGNOSIS — N3946 Mixed incontinence: Secondary | ICD-10-CM | POA: Diagnosis not present

## 2018-01-29 DIAGNOSIS — E039 Hypothyroidism, unspecified: Secondary | ICD-10-CM | POA: Diagnosis not present

## 2018-01-29 DIAGNOSIS — G629 Polyneuropathy, unspecified: Secondary | ICD-10-CM | POA: Diagnosis not present

## 2018-01-29 DIAGNOSIS — M80021D Age-related osteoporosis with current pathological fracture, right humerus, subsequent encounter for fracture with routine healing: Secondary | ICD-10-CM | POA: Diagnosis not present

## 2018-01-29 DIAGNOSIS — I5032 Chronic diastolic (congestive) heart failure: Secondary | ICD-10-CM | POA: Diagnosis not present

## 2018-01-29 DIAGNOSIS — S43014D Anterior dislocation of right humerus, subsequent encounter: Secondary | ICD-10-CM | POA: Diagnosis not present

## 2018-01-29 DIAGNOSIS — I110 Hypertensive heart disease with heart failure: Secondary | ICD-10-CM | POA: Diagnosis not present

## 2018-02-04 DIAGNOSIS — M80021D Age-related osteoporosis with current pathological fracture, right humerus, subsequent encounter for fracture with routine healing: Secondary | ICD-10-CM | POA: Diagnosis not present

## 2018-02-04 DIAGNOSIS — S43014D Anterior dislocation of right humerus, subsequent encounter: Secondary | ICD-10-CM | POA: Diagnosis not present

## 2018-02-04 DIAGNOSIS — I110 Hypertensive heart disease with heart failure: Secondary | ICD-10-CM | POA: Diagnosis not present

## 2018-02-04 DIAGNOSIS — E039 Hypothyroidism, unspecified: Secondary | ICD-10-CM | POA: Diagnosis not present

## 2018-02-04 DIAGNOSIS — G629 Polyneuropathy, unspecified: Secondary | ICD-10-CM | POA: Diagnosis not present

## 2018-02-04 DIAGNOSIS — I5032 Chronic diastolic (congestive) heart failure: Secondary | ICD-10-CM | POA: Diagnosis not present

## 2018-02-07 DIAGNOSIS — S43014D Anterior dislocation of right humerus, subsequent encounter: Secondary | ICD-10-CM | POA: Diagnosis not present

## 2018-02-07 DIAGNOSIS — E039 Hypothyroidism, unspecified: Secondary | ICD-10-CM | POA: Diagnosis not present

## 2018-02-07 DIAGNOSIS — I5032 Chronic diastolic (congestive) heart failure: Secondary | ICD-10-CM | POA: Diagnosis not present

## 2018-02-07 DIAGNOSIS — G629 Polyneuropathy, unspecified: Secondary | ICD-10-CM | POA: Diagnosis not present

## 2018-02-07 DIAGNOSIS — I110 Hypertensive heart disease with heart failure: Secondary | ICD-10-CM | POA: Diagnosis not present

## 2018-02-07 DIAGNOSIS — M80021D Age-related osteoporosis with current pathological fracture, right humerus, subsequent encounter for fracture with routine healing: Secondary | ICD-10-CM | POA: Diagnosis not present

## 2018-02-11 DIAGNOSIS — M80021D Age-related osteoporosis with current pathological fracture, right humerus, subsequent encounter for fracture with routine healing: Secondary | ICD-10-CM | POA: Diagnosis not present

## 2018-02-11 DIAGNOSIS — I5032 Chronic diastolic (congestive) heart failure: Secondary | ICD-10-CM | POA: Diagnosis not present

## 2018-02-11 DIAGNOSIS — S43014D Anterior dislocation of right humerus, subsequent encounter: Secondary | ICD-10-CM | POA: Diagnosis not present

## 2018-02-11 DIAGNOSIS — I110 Hypertensive heart disease with heart failure: Secondary | ICD-10-CM | POA: Diagnosis not present

## 2018-02-11 DIAGNOSIS — E039 Hypothyroidism, unspecified: Secondary | ICD-10-CM | POA: Diagnosis not present

## 2018-02-11 DIAGNOSIS — G629 Polyneuropathy, unspecified: Secondary | ICD-10-CM | POA: Diagnosis not present

## 2018-02-18 DIAGNOSIS — M80021D Age-related osteoporosis with current pathological fracture, right humerus, subsequent encounter for fracture with routine healing: Secondary | ICD-10-CM | POA: Diagnosis not present

## 2018-02-18 DIAGNOSIS — S43014D Anterior dislocation of right humerus, subsequent encounter: Secondary | ICD-10-CM | POA: Diagnosis not present

## 2018-02-18 DIAGNOSIS — I5032 Chronic diastolic (congestive) heart failure: Secondary | ICD-10-CM | POA: Diagnosis not present

## 2018-02-18 DIAGNOSIS — I110 Hypertensive heart disease with heart failure: Secondary | ICD-10-CM | POA: Diagnosis not present

## 2018-02-18 DIAGNOSIS — G629 Polyneuropathy, unspecified: Secondary | ICD-10-CM | POA: Diagnosis not present

## 2018-02-18 DIAGNOSIS — E039 Hypothyroidism, unspecified: Secondary | ICD-10-CM | POA: Diagnosis not present

## 2018-03-20 DIAGNOSIS — R252 Cramp and spasm: Secondary | ICD-10-CM | POA: Diagnosis not present

## 2018-03-20 DIAGNOSIS — Z6828 Body mass index (BMI) 28.0-28.9, adult: Secondary | ICD-10-CM | POA: Diagnosis not present

## 2018-03-20 DIAGNOSIS — G629 Polyneuropathy, unspecified: Secondary | ICD-10-CM | POA: Diagnosis not present

## 2018-03-22 ENCOUNTER — Encounter: Payer: Self-pay | Admitting: Cardiology

## 2018-03-25 ENCOUNTER — Ambulatory Visit: Payer: Medicare Other | Admitting: Cardiology

## 2018-03-29 ENCOUNTER — Other Ambulatory Visit (HOSPITAL_COMMUNITY): Payer: Medicare Other

## 2018-04-02 ENCOUNTER — Ambulatory Visit: Payer: Medicare Other | Admitting: Cardiology

## 2018-04-11 ENCOUNTER — Telehealth: Payer: Self-pay | Admitting: Cardiovascular Disease

## 2018-04-11 NOTE — Telephone Encounter (Signed)
Patient called and discussed cancelling elective echocardiogram due to concerns for Covid19 and need for social distancing . Patient stable with no urgent symptoms.  TTE scheduled for 4/15 can be rescheduled for 3-6 months   Try to do in June same day as office visit with [place]

## 2018-04-24 ENCOUNTER — Other Ambulatory Visit (HOSPITAL_COMMUNITY): Payer: Medicare Other

## 2018-05-23 DIAGNOSIS — I10 Essential (primary) hypertension: Secondary | ICD-10-CM | POA: Diagnosis not present

## 2018-05-23 DIAGNOSIS — E038 Other specified hypothyroidism: Secondary | ICD-10-CM | POA: Diagnosis not present

## 2018-05-23 DIAGNOSIS — R7301 Impaired fasting glucose: Secondary | ICD-10-CM | POA: Diagnosis not present

## 2018-05-23 DIAGNOSIS — R82998 Other abnormal findings in urine: Secondary | ICD-10-CM | POA: Diagnosis not present

## 2018-05-23 DIAGNOSIS — E7849 Other hyperlipidemia: Secondary | ICD-10-CM | POA: Diagnosis not present

## 2018-05-23 DIAGNOSIS — E538 Deficiency of other specified B group vitamins: Secondary | ICD-10-CM | POA: Diagnosis not present

## 2018-05-30 DIAGNOSIS — N183 Chronic kidney disease, stage 3 (moderate): Secondary | ICD-10-CM | POA: Diagnosis not present

## 2018-05-30 DIAGNOSIS — Z7189 Other specified counseling: Secondary | ICD-10-CM | POA: Diagnosis not present

## 2018-05-30 DIAGNOSIS — I129 Hypertensive chronic kidney disease with stage 1 through stage 4 chronic kidney disease, or unspecified chronic kidney disease: Secondary | ICD-10-CM | POA: Diagnosis not present

## 2018-05-30 DIAGNOSIS — N3946 Mixed incontinence: Secondary | ICD-10-CM | POA: Diagnosis not present

## 2018-05-30 DIAGNOSIS — E039 Hypothyroidism, unspecified: Secondary | ICD-10-CM | POA: Diagnosis not present

## 2018-05-30 DIAGNOSIS — R7301 Impaired fasting glucose: Secondary | ICD-10-CM | POA: Diagnosis not present

## 2018-05-30 DIAGNOSIS — E785 Hyperlipidemia, unspecified: Secondary | ICD-10-CM | POA: Diagnosis not present

## 2018-05-30 DIAGNOSIS — Z0000 Encounter for general adult medical examination without abnormal findings: Secondary | ICD-10-CM | POA: Diagnosis not present

## 2018-05-30 DIAGNOSIS — G3184 Mild cognitive impairment, so stated: Secondary | ICD-10-CM | POA: Diagnosis not present

## 2018-05-30 DIAGNOSIS — Z952 Presence of prosthetic heart valve: Secondary | ICD-10-CM | POA: Diagnosis not present

## 2018-05-30 DIAGNOSIS — M810 Age-related osteoporosis without current pathological fracture: Secondary | ICD-10-CM | POA: Diagnosis not present

## 2018-06-11 ENCOUNTER — Telehealth: Payer: Self-pay | Admitting: Cardiology

## 2018-06-11 NOTE — Telephone Encounter (Signed)
New  Message   Patient is returning your call. Please call.

## 2018-06-17 ENCOUNTER — Telehealth: Payer: Medicare Other | Admitting: Cardiology

## 2018-06-19 ENCOUNTER — Telehealth (HOSPITAL_COMMUNITY): Payer: Self-pay | Admitting: Radiology

## 2018-06-19 NOTE — Telephone Encounter (Signed)

## 2018-06-20 ENCOUNTER — Other Ambulatory Visit: Payer: Self-pay

## 2018-06-20 ENCOUNTER — Encounter (INDEPENDENT_AMBULATORY_CARE_PROVIDER_SITE_OTHER): Payer: Self-pay

## 2018-06-20 ENCOUNTER — Ambulatory Visit (HOSPITAL_COMMUNITY): Payer: Medicare Other | Attending: Cardiovascular Disease

## 2018-06-20 DIAGNOSIS — Z952 Presence of prosthetic heart valve: Secondary | ICD-10-CM | POA: Diagnosis not present

## 2018-06-20 DIAGNOSIS — I350 Nonrheumatic aortic (valve) stenosis: Secondary | ICD-10-CM | POA: Diagnosis not present

## 2018-06-26 ENCOUNTER — Telehealth: Payer: Self-pay | Admitting: Adult Health

## 2018-06-26 NOTE — Telephone Encounter (Signed)
[place] pending, no smartphone, consent, pre reg complete 06/26/18 AF

## 2018-06-30 NOTE — Progress Notes (Signed)
Virtual Visit via Telephone Note   This visit type was conducted due to national recommendations for restrictions regarding the COVID-19 Pandemic (e.g. social distancing) in an effort to limit this patient's exposure and mitigate transmission in our community.  Due to her co-morbid illnesses, this patient is at least at moderate risk for complications without adequate follow up.  This format is felt to be most appropriate for this patient at this time.  The patient did not have access to video technology/had technical difficulties with video requiring transitioning to audio format only (telephone).  All issues noted in this document were discussed and addressed.  No physical exam could be performed with this format.  Please refer to the patient's chart for her  consent to telehealth for [facility].   Date:  07/01/2018   ID:  Megan Pitts, DOB 03-21-1927, MRN [id number]  Patient Location: Home Provider Location: Office  PCP:  Marton Redwood, MD  Cardiologist:  Peter Martinique, MD  Electrophysiologist:  None   Evaluation Performed:  Follow-Up Visit  Chief Complaint: Follow up  History of Present Illness:    Megan Pitts is a 85 y.o. female with known history of aortic valve disease, severe stenosis, status post AVR with a 23 mm [place] stentless porcine valve placed in July 2017.  Echo in January 2018 revealed good valve function with only trivial AI.    The patient has other history of hypertension, PVCs, hyperlipidemia, postoperative atrial fib, GERD, Gilbert's syndrome, and carotid disease.  She was also noted to have bradycardia when taking beta-blockers.  She was last seen by Dr. Martinique on 03/16/2017 and was doing well.  She  Is complaining of weakness in her legs which occurred last night. She states its the first time it happened and has now passed.She states that she feels better today. Other than that she has no complaints. Her PCP is providing all of her medications and doing all of  her labs. She is not longer taking Crestor.   The patient have symptoms concerning for COVID-19 infection (fever, chills, cough, or new shortness of breath).   Past Medical History:  Diagnosis Date  . A-fib ([place])   . Aortic stenosis    s/p AVR 07/2005(porcine)  . Carotid bruit 09/2008   <50% B  . GERD (gastroesophageal reflux disease)   . Gilbert's syndrome   . Hyperlipidemia   . Hypertension   . IFG (impaired fasting glucose)   . OAB (overactive bladder)   . Osteopenia   . Phlebitis    UPPER EXTREMITY  . PVC's (premature ventricular contractions)   . Shingles   . Varicose veins    s/p treatment   . Vertigo    Past Surgical History:  Procedure Laterality Date  . ABDOMINAL HYSTERECTOMY  1963   partial  . AORTIC VALVE REPLACEMENT  08/01/2005   WITH A #23MM TORONTO STENTLESS PORCINE AORTIC VALVE  . CARDIAC CATHETERIZATION  07/25/2005   EF 60% THE AORTIC VALVE IS HEAVILY CALCIFIED WITH REDUCED OPENING. NO MVP  . FOOT SURGERY    . HAND SURGERY    . HEMORRHOID SURGERY  1980's  . US ECHOCARDIOGRAPHY  04/14/2008   EF 55-60%. NORMAL  . US ECHOCARDIOGRAPHY  11/21/2005   EF 55-60%  . US ECHOCARDIOGRAPHY  07/20/2005   EF 55-60%  . US ECHOCARDIOGRAPHY  10/19/2004   EF 55-60%  . VARICOSE VEIN SURGERY  2010   laser treatment     Current Meds  Medication Sig  .  acetaminophen (TYLENOL) 500 MG tablet Take 500 mg by mouth every 6 (six) hours as needed for mild pain.  Marland Kitchen aspirin 81 MG tablet Take 81 mg by mouth daily.    Marland Kitchen donepezil (ARICEPT) 10 MG tablet Take 10 mg by mouth at bedtime.   Marland Kitchen ibuprofen (ADVIL,MOTRIN) 400 MG tablet Take 1 tablet (400 mg total) by mouth every 6 (six) hours as needed for mild pain or moderate pain.  . Multiple Vitamin (MULTIVITAMIN) tablet Take 1 tablet by mouth daily.  Marland Kitchen olmesartan (BENICAR) 40 MG tablet Take 40 mg by mouth daily.  Marland Kitchen oxybutynin (DITROPAN-XL) 10 MG 24 hr tablet Take 10 mg by mouth at bedtime.   . Vitamin D, Ergocalciferol, (DRISDOL) 50000  units CAPS capsule Take 50,000 Units by mouth every 7 (seven) days. On Sundays  . [DISCONTINUED] rosuvastatin (CRESTOR) 40 MG tablet Take 20 mg by mouth daily.      Allergies:   Levothyroxine and Synthroid [levothyroxine sodium]   Social History   Tobacco Use  . Smoking status: Never Smoker  . Smokeless tobacco: Never Used  Substance Use Topics  . Alcohol use: No  . Drug use: No     Family Hx: The patient's family history includes Alcohol abuse in her brother; Alzheimer's disease in her father; Leukemia in her brother; Pneumonia (age of onset: 83) in her mother.  ROS:   Please see the history of present illness.    All other systems reviewed and are negative.   Prior CV studies:   The following studies were reviewed today: Echo 06/20/2018 1. The left ventricle has hyperdynamic systolic function, with an ejection fraction of >65%. The cavity size was normal. Left ventricular diastolic Doppler parameters are consistent with impaired relaxation.  2. The right ventricle has normal systolic function. The cavity was normal. There is no increase in right ventricular wall thickness.  3. Aortic valve regurgitation is trivial by color flow Doppler. No stenosis of the aortic valve.  4. The aortic root and ascending aorta are normal in size and structure.  5. The interatrial septum was not assessed.  Labs/Other Tests and Data Reviewed:    EKG:    Recent Labs: 09/24/2017: ALT 18 09/25/2017: BUN 24; Creatinine, Ser 1.09; Potassium 4.5; Sodium 139 09/29/2017: Hemoglobin 10.0; Platelets 295   Recent Lipid Panel No results found for: CHOL, TRIG, HDL, CHOLHDL, LDLCALC, LDLDIRECT  Wt Readings from Last 3 Encounters:  07/01/18 153 lb (69.4 kg)  09/27/17 158 lb 8.2 oz (71.9 kg)  03/16/17 154 lb 9.6 oz (70.1 kg)     Objective:    Vital Signs:  BP (!) 160/71   Pulse 79   Ht 5' 3.5" (1.613 m)   Wt 153 lb (69.4 kg)   BMI 26.68 kg/m    VITAL SIGNS:  reviewed GEN:  no acute distress  NEURO:  alert and oriented x 3, no obvious focal deficit PSYCH:  normal affect  ASSESSMENT & PLAN:    1. AoV stenosis: S/P AoV repair. She is doing well and is not having any DOE or chest pain. Will repeat echo in one year unless she is symptomatic.   2. Hypertension: BP is elevated slightly today. She stats it is usually much lower than this at home. She is on olmesartan for BP control. If remains elevated would recommend increasing the dose to 80 mg.   3. Hx of Hypercholesterolemia:  She is not longer on statins. She has labs planned by PCP.   COVID-19 Education: The signs  and symptoms of COVID-19 were discussed with the patient and how to seek care for testing (follow up with PCP or arrange E-visit).  The importance of social distancing was discussed today.  Time:   Today, I have spent 10 minutes with the patient with telehealth technology discussing the above problems.     Medication Adjustments/Labs and Tests Ordered: Current medicines are reviewed at length with the patient today.  Concerns regarding medicines are outlined above.   Tests Ordered: No orders of the defined types were placed in this encounter.   Medication Changes: No orders of the defined types were placed in this encounter.   Disposition:  Follow up one year.  Signed, Phill Myron. West Pugh, ANP, AACC  07/01/2018 4:52 PM    [facility]

## 2018-07-01 ENCOUNTER — Telehealth (INDEPENDENT_AMBULATORY_CARE_PROVIDER_SITE_OTHER): Payer: Medicare Other | Admitting: Adult Health

## 2018-07-01 ENCOUNTER — Encounter: Payer: Self-pay | Admitting: Adult Health

## 2018-07-01 VITALS — BP 160/71 | HR 79 | Ht 63.5 in | Wt 153.0 lb

## 2018-07-01 DIAGNOSIS — I10 Essential (primary) hypertension: Secondary | ICD-10-CM | POA: Diagnosis not present

## 2018-07-01 DIAGNOSIS — E7800 Pure hypercholesterolemia, unspecified: Secondary | ICD-10-CM | POA: Diagnosis not present

## 2018-07-01 DIAGNOSIS — Z952 Presence of prosthetic heart valve: Secondary | ICD-10-CM

## 2018-07-01 NOTE — Patient Instructions (Signed)
Follow-Up: You will need a follow up appointment in 12 months.  Please call our office 2 months in advance, April 2021 to schedule this, June 2021 appointment.  You may see Peter Martinique, MD Jory Sims, DNP, AACC  or one of the following Advanced Practice Providers on your designated Care Team:  Almyra Deforest, PA-C  Fabian Sharp, [place]       Medication Instructions:  NO CHANGES- Your physician recommends that you continue on your current medications as directed. Please refer to the Current Medication list given to you today. If you need a refill on your cardiac medications before your next appointment, please call your pharmacy. Labwork: When you have labs (blood work) and your tests are completely normal, you will receive your results ONLY by [place] (if you have MyChart) -OR- A paper copy in the mail.  At [facility], you and your health needs are our priority.  As part of our continuing mission to provide you with exceptional heart care, we have created designated Provider Care Teams.  These Care Teams include your primary Cardiologist (physician) and Advanced Practice Providers (APPs -  Physician Assistants and Nurse Practitioners) who all work together to provide you with the care you need, when you need it.  Thank you for choosing [facility] at [facility]!!

## 2018-07-04 DIAGNOSIS — E871 Hypo-osmolality and hyponatremia: Secondary | ICD-10-CM | POA: Diagnosis not present

## 2018-07-04 DIAGNOSIS — R6889 Other general symptoms and signs: Secondary | ICD-10-CM | POA: Diagnosis not present

## 2018-07-04 DIAGNOSIS — Z20818 Contact with and (suspected) exposure to other bacterial communicable diseases: Secondary | ICD-10-CM | POA: Diagnosis not present

## 2018-09-27 DIAGNOSIS — Z23 Encounter for immunization: Secondary | ICD-10-CM | POA: Diagnosis not present

## 2018-12-03 DIAGNOSIS — Z974 Presence of external hearing-aid: Secondary | ICD-10-CM | POA: Diagnosis not present

## 2018-12-03 DIAGNOSIS — I10 Essential (primary) hypertension: Secondary | ICD-10-CM | POA: Diagnosis not present

## 2018-12-03 DIAGNOSIS — H6123 Impacted cerumen, bilateral: Secondary | ICD-10-CM | POA: Diagnosis not present

## 2018-12-03 DIAGNOSIS — H9193 Unspecified hearing loss, bilateral: Secondary | ICD-10-CM | POA: Diagnosis not present

## 2018-12-03 DIAGNOSIS — M810 Age-related osteoporosis without current pathological fracture: Secondary | ICD-10-CM | POA: Diagnosis not present

## 2018-12-03 DIAGNOSIS — N1831 Chronic kidney disease, stage 3a: Secondary | ICD-10-CM | POA: Diagnosis not present

## 2018-12-03 DIAGNOSIS — H9190 Unspecified hearing loss, unspecified ear: Secondary | ICD-10-CM | POA: Diagnosis not present

## 2018-12-03 DIAGNOSIS — E871 Hypo-osmolality and hyponatremia: Secondary | ICD-10-CM | POA: Diagnosis not present

## 2019-02-08 ENCOUNTER — Ambulatory Visit: Payer: Medicare Other

## 2019-02-13 ENCOUNTER — Ambulatory Visit: Payer: Medicare Other

## 2019-02-14 ENCOUNTER — Ambulatory Visit: Payer: Medicare Other | Attending: Internal Medicine

## 2019-02-14 DIAGNOSIS — Z23 Encounter for immunization: Secondary | ICD-10-CM

## 2019-02-14 NOTE — Progress Notes (Signed)
   Covid-19 Vaccination Clinic  Name:  SHANEICE STEPANSKI    MRN: [id number] DOB: 1927-09-02  02/14/2019  Ms. Sloboda was observed post Covid-19 immunization for 15 minutes without incidence. She was provided with Vaccine Information Sheet and instruction to access the V-Safe system.   Ms. Iwai was instructed to call 911 with any severe reactions post vaccine: Marland Kitchen Difficulty breathing  . Swelling of your face and throat  . A fast heartbeat  . A bad rash all over your body  . Dizziness and weakness    Immunizations Administered    Name Date Dose VIS Date Route   Pfizer COVID-19 Vaccine 02/14/2019  6:03 PM 0.3 mL 12/20/2018 Intramuscular   Manufacturer: [place]   Lot: [id number]   [place]: [id number]

## 2019-02-18 DIAGNOSIS — L720 Epidermal cyst: Secondary | ICD-10-CM | POA: Diagnosis not present

## 2019-02-18 DIAGNOSIS — L820 Inflamed seborrheic keratosis: Secondary | ICD-10-CM | POA: Diagnosis not present

## 2019-02-18 DIAGNOSIS — L821 Other seborrheic keratosis: Secondary | ICD-10-CM | POA: Diagnosis not present

## 2019-03-11 ENCOUNTER — Ambulatory Visit: Payer: Medicare Other | Attending: Internal Medicine

## 2019-03-11 ENCOUNTER — Ambulatory Visit: Payer: Medicare Other

## 2019-03-11 DIAGNOSIS — Z23 Encounter for immunization: Secondary | ICD-10-CM | POA: Insufficient documentation

## 2019-03-11 NOTE — Progress Notes (Signed)
   Covid-19 Vaccination Clinic  Name:  Megan Pitts    MRN: [id number] DOB: 03-24-1927  03/11/2019  [name] was observed post Covid-19 immunization for 15 minutes without incident. She was provided with Vaccine Information Sheet and instruction to access the V-Safe system.   [name] was instructed to call 911 with any severe reactions post vaccine: Marland Kitchen Difficulty breathing  . Swelling of face and throat  . A fast heartbeat  . A bad rash all over body  . Dizziness and weakness   Immunizations Administered    Name Date Dose VIS Date Route   Pfizer COVID-19 Vaccine 03/11/2019  2:53 PM 0.3 mL 12/20/2018 Intramuscular   Manufacturer: [place]   Lot: [id number]   [place]: [id number]

## 2019-04-04 IMAGING — CT CT SHOULDER*R* W/O CM
1 series · 12 of 14 positions shown, 15 images · non-contrast
Comparison: Right shoulder CT and radiographs 09/24/2017.

CLINICAL DATA: Right shoulder pain. Patient fell, dislocating the
right shoulder 2 weeks ago.

EXAM:
CT OF THE UPPER RIGHT EXTREMITY WITHOUT CONTRAST
TECHNIQUE: Multidetector CT imaging of the right shoulder was performed
according to the standard protocol.

[Series 4: soft tissue · axial · 0.43mm/px · z∈[+589,+765]mm · 12 of 105 slices shown, 15 images]
[im 9/105  soft-tissue]
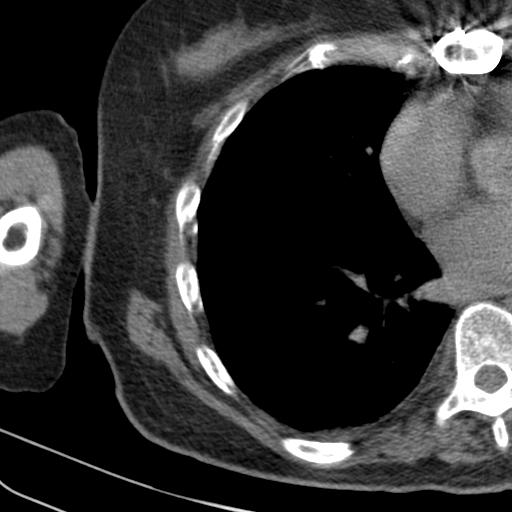
[im 9/105  bone]
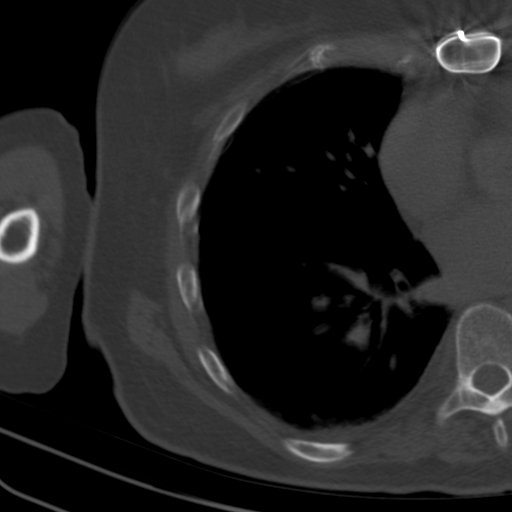
[im 17/105  bone]
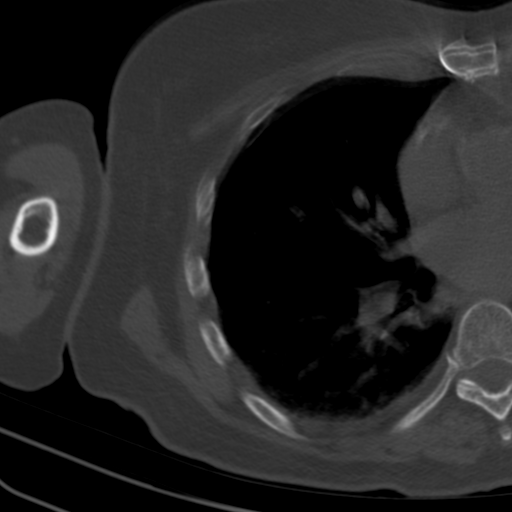
[im 25/105  bone]
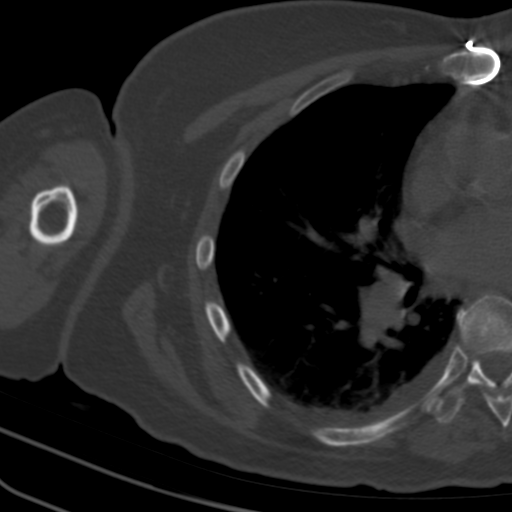
[im 33/105  bone]
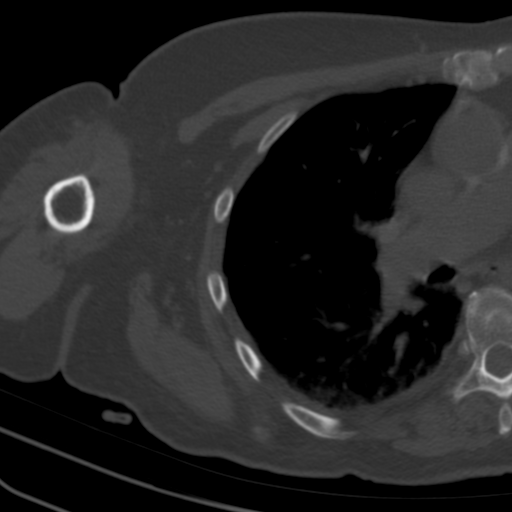
[im 41/105  soft-tissue]
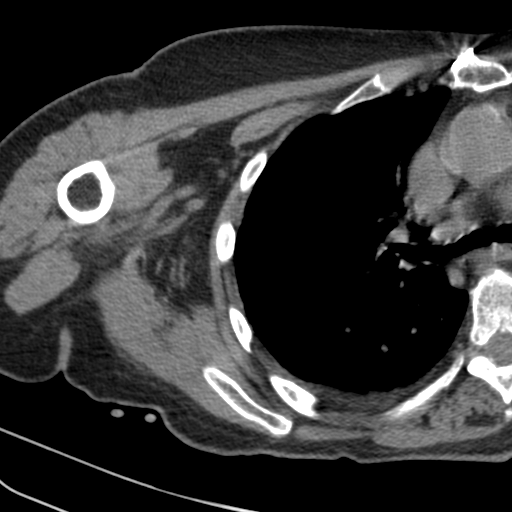
[im 41/105  bone]
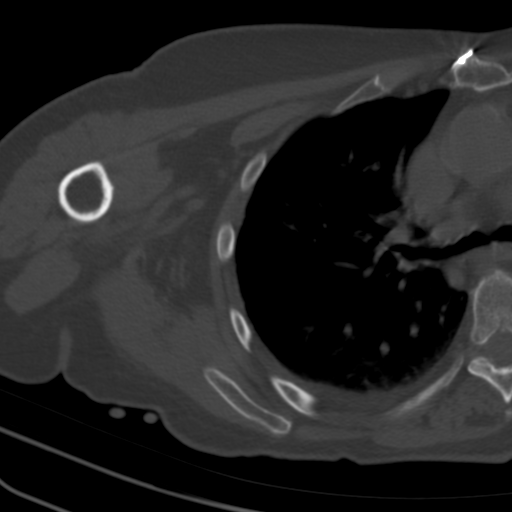
[im 49/105  bone]
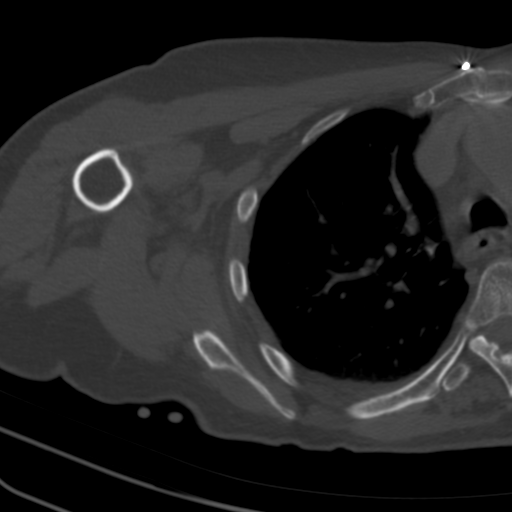
[im 57/105  bone]
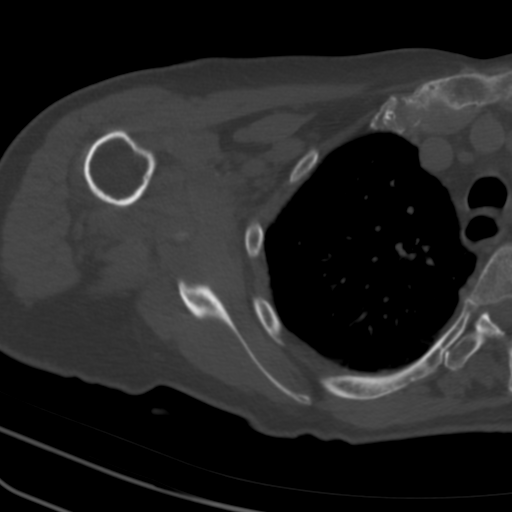
[im 65/105  bone]
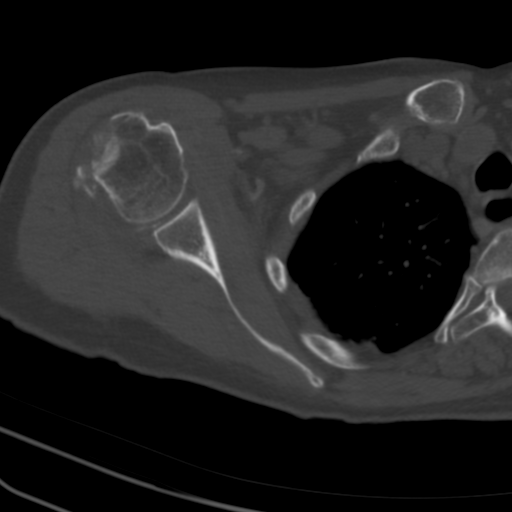
[im 73/105  soft-tissue]
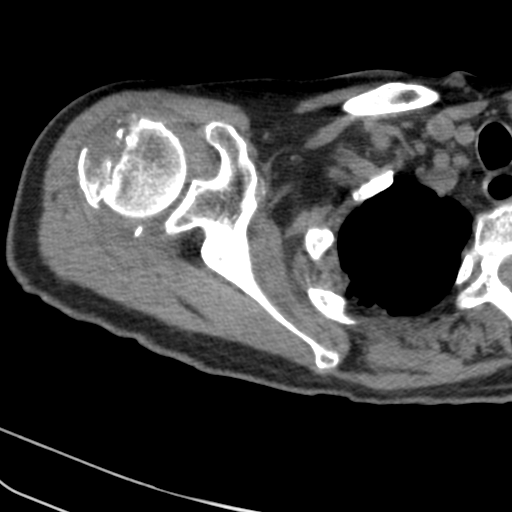
[im 73/105  bone]
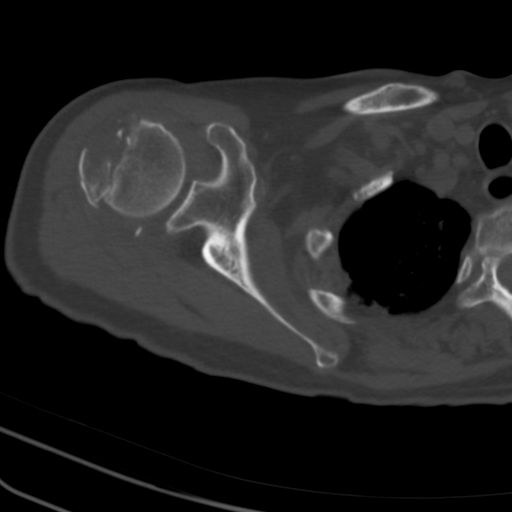
[im 81/105  bone]
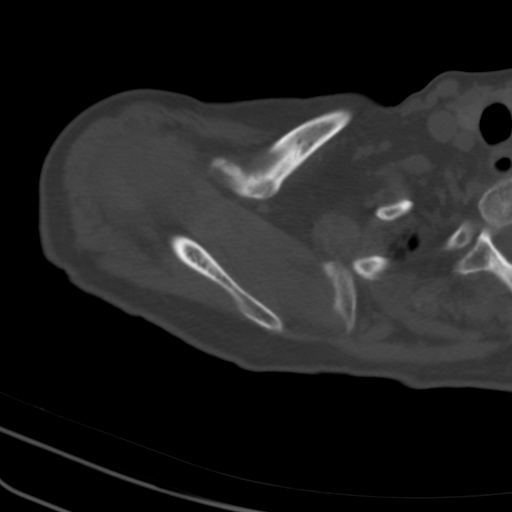
[im 89/105  bone]
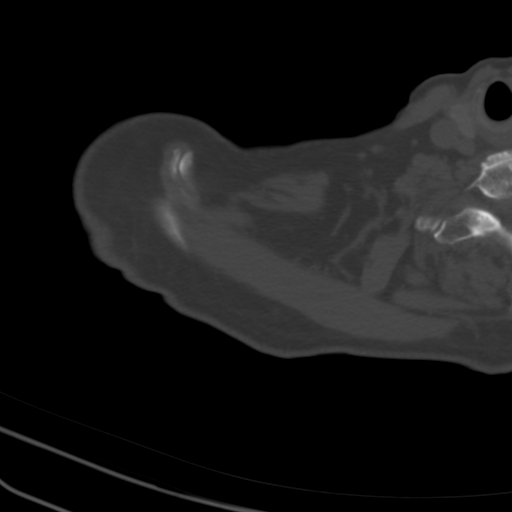
[im 97/105  bone]
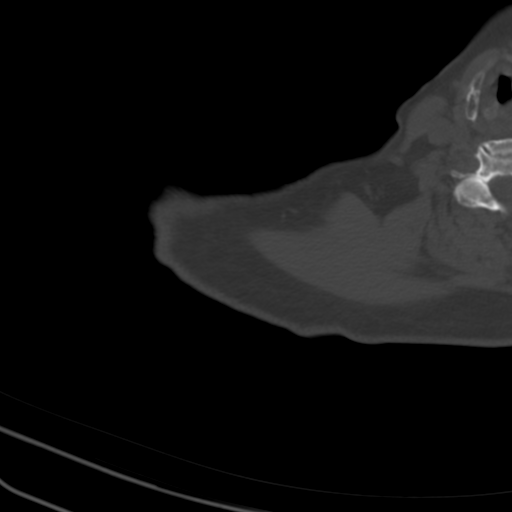

[12 of 14 positions shown; findings below may reference images not displayed]

FINDINGS: Bones/Joint/Cartilage

The comminuted and moderately displaced fracture of the greater
tuberosity is unchanged. There is no involvement the humeral head
articular surface. The humeral head remains located. There are
numerous fracture fragments within the joint. Probable fracture of
the inferior glenoid rim again noted, consistent with an osseous
[place] lesion.

There is a moderate size shoulder joint effusion. No other acute
osseous findings are demonstrated.

Ligaments

Suboptimally assessed by CT.

Muscles and Tendons

The supraspinatus and infraspinatus tendons insert on the superiorly
displaced avulsion fracture of the greater tuberosity. No focal
muscular atrophy or hematoma.

Soft tissues

Improved soft tissue swelling in the axilla. No focal fluid
collection.
IMPRESSION: 1. No significant change in comminuted and superiorly displaced
fracture of the right greater tuberosity compared with prior CT
09/24/2017.
[DATE]. Stable fracture of the inferior glenoid rim and multiple
intra-articular fracture fragments. No dislocation.

## 2019-05-05 DIAGNOSIS — R05 Cough: Secondary | ICD-10-CM | POA: Diagnosis not present

## 2019-05-05 DIAGNOSIS — I129 Hypertensive chronic kidney disease with stage 1 through stage 4 chronic kidney disease, or unspecified chronic kidney disease: Secondary | ICD-10-CM | POA: Diagnosis not present

## 2019-05-05 DIAGNOSIS — J019 Acute sinusitis, unspecified: Secondary | ICD-10-CM | POA: Diagnosis not present

## 2019-05-05 DIAGNOSIS — N1831 Chronic kidney disease, stage 3a: Secondary | ICD-10-CM | POA: Diagnosis not present

## 2019-05-12 DIAGNOSIS — M25511 Pain in right shoulder: Secondary | ICD-10-CM | POA: Diagnosis not present

## 2019-05-15 ENCOUNTER — Ambulatory Visit
Admission: RE | Admit: 2019-05-15 | Discharge: 2019-05-15 | Disposition: A | Discharge status: Home / Self Care | Payer: Medicare Other | Source: Ambulatory Visit | Attending: Sports Medicine | Admitting: Sports Medicine

## 2019-05-15 ENCOUNTER — Other Ambulatory Visit: Payer: Self-pay | Admitting: Sports Medicine

## 2019-05-15 ENCOUNTER — Other Ambulatory Visit: Payer: Self-pay

## 2019-05-15 DIAGNOSIS — M5412 Radiculopathy, cervical region: Secondary | ICD-10-CM | POA: Diagnosis not present

## 2019-05-15 DIAGNOSIS — M542 Cervicalgia: Secondary | ICD-10-CM

## 2019-05-15 DIAGNOSIS — S42251A Displaced fracture of greater tuberosity of right humerus, initial encounter for closed fracture: Secondary | ICD-10-CM | POA: Diagnosis not present

## 2019-05-15 DIAGNOSIS — S129XXA Fracture of neck, unspecified, initial encounter: Secondary | ICD-10-CM | POA: Diagnosis not present

## 2019-05-15 DIAGNOSIS — M25511 Pain in right shoulder: Secondary | ICD-10-CM

## 2019-05-16 DIAGNOSIS — S12300A Unspecified displaced fracture of fourth cervical vertebra, initial encounter for closed fracture: Secondary | ICD-10-CM | POA: Diagnosis not present

## 2019-05-16 DIAGNOSIS — M5412 Radiculopathy, cervical region: Secondary | ICD-10-CM | POA: Diagnosis not present

## 2019-05-16 DIAGNOSIS — M542 Cervicalgia: Secondary | ICD-10-CM | POA: Diagnosis not present

## 2019-06-03 ENCOUNTER — Inpatient Hospital Stay (HOSPITAL_COMMUNITY)
Admission: EM | Admit: 2019-06-03 | Discharge: 2019-06-06 | DRG: 552 | Disposition: A | Discharge status: Skilled Nursing Facility | Payer: Medicare Other | Attending: Family Medicine | Admitting: Family Medicine

## 2019-06-03 ENCOUNTER — Inpatient Hospital Stay (HOSPITAL_COMMUNITY): Payer: Medicare Other

## 2019-06-03 ENCOUNTER — Encounter (HOSPITAL_COMMUNITY): Payer: Self-pay

## 2019-06-03 ENCOUNTER — Other Ambulatory Visit: Payer: Self-pay

## 2019-06-03 DIAGNOSIS — S12300A Unspecified displaced fracture of fourth cervical vertebra, initial encounter for closed fracture: Secondary | ICD-10-CM | POA: Diagnosis present

## 2019-06-03 DIAGNOSIS — S129XXD Fracture of neck, unspecified, subsequent encounter: Secondary | ICD-10-CM | POA: Diagnosis not present

## 2019-06-03 DIAGNOSIS — M4802 Spinal stenosis, cervical region: Secondary | ICD-10-CM | POA: Diagnosis present

## 2019-06-03 DIAGNOSIS — S161XXA Strain of muscle, fascia and tendon at neck level, initial encounter: Secondary | ICD-10-CM | POA: Diagnosis not present

## 2019-06-03 DIAGNOSIS — I4891 Unspecified atrial fibrillation: Secondary | ICD-10-CM | POA: Diagnosis present

## 2019-06-03 DIAGNOSIS — Z20822 Contact with and (suspected) exposure to covid-19: Secondary | ICD-10-CM | POA: Diagnosis present

## 2019-06-03 DIAGNOSIS — S129XXA Fracture of neck, unspecified, initial encounter: Secondary | ICD-10-CM | POA: Diagnosis present

## 2019-06-03 DIAGNOSIS — N3281 Overactive bladder: Secondary | ICD-10-CM | POA: Diagnosis present

## 2019-06-03 DIAGNOSIS — Z791 Long term (current) use of non-steroidal anti-inflammatories (NSAID): Secondary | ICD-10-CM

## 2019-06-03 DIAGNOSIS — W19XXXA Unspecified fall, initial encounter: Secondary | ICD-10-CM | POA: Diagnosis present

## 2019-06-03 DIAGNOSIS — S12400A Unspecified displaced fracture of fifth cervical vertebra, initial encounter for closed fracture: Secondary | ICD-10-CM

## 2019-06-03 DIAGNOSIS — I5032 Chronic diastolic (congestive) heart failure: Secondary | ICD-10-CM | POA: Diagnosis present

## 2019-06-03 DIAGNOSIS — E876 Hypokalemia: Secondary | ICD-10-CM | POA: Diagnosis present

## 2019-06-03 DIAGNOSIS — Z03818 Encounter for observation for suspected exposure to other biological agents ruled out: Secondary | ICD-10-CM | POA: Diagnosis not present

## 2019-06-03 DIAGNOSIS — R41841 Cognitive communication deficit: Secondary | ICD-10-CM | POA: Diagnosis not present

## 2019-06-03 DIAGNOSIS — E7800 Pure hypercholesterolemia, unspecified: Secondary | ICD-10-CM | POA: Diagnosis not present

## 2019-06-03 DIAGNOSIS — Z806 Family history of leukemia: Secondary | ICD-10-CM | POA: Diagnosis not present

## 2019-06-03 DIAGNOSIS — M5412 Radiculopathy, cervical region: Secondary | ICD-10-CM | POA: Diagnosis present

## 2019-06-03 DIAGNOSIS — M25511 Pain in right shoulder: Secondary | ICD-10-CM | POA: Diagnosis present

## 2019-06-03 DIAGNOSIS — Z90710 Acquired absence of both cervix and uterus: Secondary | ICD-10-CM | POA: Diagnosis not present

## 2019-06-03 DIAGNOSIS — Z953 Presence of xenogenic heart valve: Secondary | ICD-10-CM | POA: Diagnosis not present

## 2019-06-03 DIAGNOSIS — R1312 Dysphagia, oropharyngeal phase: Secondary | ICD-10-CM | POA: Diagnosis not present

## 2019-06-03 DIAGNOSIS — Z811 Family history of alcohol abuse and dependence: Secondary | ICD-10-CM

## 2019-06-03 DIAGNOSIS — E785 Hyperlipidemia, unspecified: Secondary | ICD-10-CM | POA: Diagnosis present

## 2019-06-03 DIAGNOSIS — Z820 Family history of epilepsy and other diseases of the nervous system: Secondary | ICD-10-CM

## 2019-06-03 DIAGNOSIS — R278 Other lack of coordination: Secondary | ICD-10-CM | POA: Diagnosis not present

## 2019-06-03 DIAGNOSIS — I5033 Acute on chronic diastolic (congestive) heart failure: Secondary | ICD-10-CM | POA: Diagnosis present

## 2019-06-03 DIAGNOSIS — I10 Essential (primary) hypertension: Secondary | ICD-10-CM | POA: Diagnosis not present

## 2019-06-03 DIAGNOSIS — R5381 Other malaise: Secondary | ICD-10-CM | POA: Diagnosis present

## 2019-06-03 DIAGNOSIS — M6281 Muscle weakness (generalized): Secondary | ICD-10-CM | POA: Diagnosis not present

## 2019-06-03 DIAGNOSIS — M8580 Other specified disorders of bone density and structure, unspecified site: Secondary | ICD-10-CM | POA: Diagnosis present

## 2019-06-03 DIAGNOSIS — I110 Hypertensive heart disease with heart failure: Secondary | ICD-10-CM | POA: Diagnosis present

## 2019-06-03 DIAGNOSIS — R2681 Unsteadiness on feet: Secondary | ICD-10-CM | POA: Diagnosis not present

## 2019-06-03 DIAGNOSIS — Z79899 Other long term (current) drug therapy: Secondary | ICD-10-CM | POA: Diagnosis not present

## 2019-06-03 DIAGNOSIS — S12390D Other displaced fracture of fourth cervical vertebra, subsequent encounter for fracture with routine healing: Secondary | ICD-10-CM | POA: Diagnosis not present

## 2019-06-03 DIAGNOSIS — Z9181 History of falling: Secondary | ICD-10-CM | POA: Diagnosis not present

## 2019-06-03 DIAGNOSIS — E440 Moderate protein-calorie malnutrition: Secondary | ICD-10-CM | POA: Diagnosis not present

## 2019-06-03 DIAGNOSIS — Z7982 Long term (current) use of aspirin: Secondary | ICD-10-CM

## 2019-06-03 LAB — BASIC METABOLIC PANEL
Anion gap: 14 (ref 5–15)
BUN: 16 mg/dL (ref 8–23)
CO2: 21 mmol/L — ABNORMAL LOW (ref 22–32)
Calcium: 9.3 mg/dL (ref 8.9–10.3)
Chloride: 102 mmol/L (ref 98–111)
Creatinine, Ser: 0.82 mg/dL (ref 0.44–1.00)
GFR calc Af Amer: >60 mL/min (ref >60)
GFR calc non Af Amer: >60 mL/min (ref >60)
Glucose, Bld: 101 mg/dL — ABNORMAL HIGH (ref 70–99)
Potassium: 3.9 mmol/L (ref 3.5–5.1)
Sodium: 137 mmol/L (ref 135–145)

## 2019-06-03 LAB — CBC WITH DIFFERENTIAL/PLATELET
Abs Immature Granulocytes: 0.01 10*3/uL (ref 0.00–0.07)
Basophils Absolute: 0.1 10*3/uL (ref 0.0–0.1)
Basophils Relative: 1 %
Eosinophils Absolute: 0.5 10*3/uL (ref 0.0–0.5)
Eosinophils Relative: 5 %
HCT: 43.6 % (ref 36.0–46.0)
Hemoglobin: 14.5 g/dL (ref 12.0–15.0)
Immature Granulocytes: 0 %
Lymphocytes Relative: 21 %
Lymphs Abs: 1.9 10*3/uL (ref 0.7–4.0)
MCH: 30.2 pg (ref 26.0–34.0)
MCHC: 33.3 g/dL (ref 30.0–36.0)
MCV: 90.8 fL (ref 80.0–100.0)
Monocytes Absolute: 0.9 10*3/uL (ref 0.1–1.0)
Monocytes Relative: 10 %
Neutro Abs: 5.8 10*3/uL (ref 1.7–7.7)
Neutrophils Relative %: 63 %
Platelets: 282 10*3/uL (ref 150–400)
RBC: 4.8 MIL/uL (ref 3.87–5.11)
RDW: 12.7 % (ref 11.5–15.5)
WBC: 9.1 10*3/uL (ref 4.0–10.5)
nRBC: 0 % (ref 0.0–0.2)

## 2019-06-03 LAB — URINALYSIS, ROUTINE W REFLEX MICROSCOPIC
Bilirubin Urine: NEGATIVE
Glucose, UA: NEGATIVE mg/dL
Hgb urine dipstick: NEGATIVE
Ketones, ur: NEGATIVE mg/dL
Leukocytes,Ua: NEGATIVE
Nitrite: NEGATIVE
Protein, ur: NEGATIVE mg/dL
Specific Gravity, Urine: 1.008 (ref 1.005–1.030)
pH: 6 (ref 5.0–8.0)

## 2019-06-03 MED ORDER — SODIUM CHLORIDE 0.45 % IV SOLN: INTRAVENOUS | Status: DC

## 2019-06-03 MED ORDER — FENTANYL CITRATE (PF) 100 MCG/2ML IJ SOLN
25.0000 ug | Freq: Once | INTRAMUSCULAR | Status: AC
  Administered 2019-06-03: 25 ug via INTRAVENOUS
  Filled 2019-06-03: qty 2

## 2019-06-03 MED ORDER — OXYCODONE HCL 5 MG PO TABS: 5.0000 mg | ORAL_TABLET | ORAL | Status: DC | PRN

## 2019-06-03 MED ORDER — ONDANSETRON HCL 4 MG/2ML IJ SOLN: 4.0000 mg | Freq: Four times a day (QID) | INTRAMUSCULAR | Status: DC | PRN

## 2019-06-03 MED ORDER — KETOROLAC TROMETHAMINE 15 MG/ML IJ SOLN
15.0000 mg | Freq: Four times a day (QID) | INTRAMUSCULAR | Status: DC | PRN
  Administered 2019-06-03 – 2019-06-04 (×2): 15 mg via INTRAVENOUS
  Filled 2019-06-03 (×2): qty 1

## 2019-06-03 MED ORDER — ONDANSETRON HCL 4 MG PO TABS: 4.0000 mg | ORAL_TABLET | Freq: Four times a day (QID) | ORAL | Status: DC | PRN

## 2019-06-03 MED ORDER — ACETAMINOPHEN 325 MG PO TABS
650.0000 mg | ORAL_TABLET | Freq: Once | ORAL | Status: AC | PRN
  Administered 2019-06-03: 650 mg via ORAL
  Filled 2019-06-03: qty 2

## 2019-06-03 MED ORDER — ENOXAPARIN SODIUM 40 MG/0.4ML ~~LOC~~ SOLN
40.0000 mg | SUBCUTANEOUS | Status: DC
  Administered 2019-06-03: 40 mg via SUBCUTANEOUS
  Filled 2019-06-03: qty 0.4

## 2019-06-03 NOTE — Consult Note (Signed)
Reason for Consult:Neck and Right shoulder pain after fall and cervical fractures Referring Physician: EDP  ZAMERIA VANACKER is an 86 y.o. female.  HPI: 86 yo female who presents complaining of worsening neck and right shoulder pain following a fall which resulted in cervical spine fractures and now is causing right C5 radiculopathy. Patient was evaluated initially after her fall with CT scans of the neck and shoulder which showed there cervical fractures.  She has been seen as an outpatient by spine specialist Dr Melina Schools with [facility]. Plan was for non surgical care of the spine with a hard "[place]en" collar. Due to progressive pain and decreased mobility she was referred to the ER because she could not care for herself and possible/likely SNF placement.    Past Medical History:  Diagnosis Date  . A-fib ([place])   . Aortic stenosis    s/p AVR 07/2005(porcine)  . Carotid bruit 09/2008   <50% B  . GERD (gastroesophageal reflux disease)   . Gilbert's syndrome   . Hyperlipidemia   . Hypertension   . IFG (impaired fasting glucose)   . OAB (overactive bladder)   . Osteopenia   . Phlebitis    UPPER EXTREMITY  . PVC's (premature ventricular contractions)   . Shingles   . Varicose veins    s/p treatment   . Vertigo     Past Surgical History:  Procedure Laterality Date  . ABDOMINAL HYSTERECTOMY  1963   partial  . AORTIC VALVE REPLACEMENT  08/01/2005   WITH A #23MM [place] STENTLESS PORCINE AORTIC VALVE  . CARDIAC CATHETERIZATION  07/25/2005   EF 60% THE AORTIC VALVE IS HEAVILY CALCIFIED WITH REDUCED OPENING. NO MVP  . FOOT SURGERY    . HAND SURGERY    . HEMORRHOID SURGERY  1980's  . US ECHOCARDIOGRAPHY  04/14/2008   EF 55-60%. NORMAL  . US ECHOCARDIOGRAPHY  11/21/2005   EF 55-60%  . US ECHOCARDIOGRAPHY  07/20/2005   EF 55-60%  . US ECHOCARDIOGRAPHY  10/19/2004   EF 55-60%  . VARICOSE VEIN SURGERY  2010   laser treatment    Family History  Problem Relation Age of Onset  .  Pneumonia Mother 27  . Alzheimer's disease Father   . Leukemia Brother   . Alcohol abuse Brother     Social History:  reports that she has never smoked. She has never used smokeless tobacco. She reports that she does not drink alcohol or use drugs.  Allergies:  Allergies  Allergen Reactions  . Levothyroxine Other (See Comments)    Pt stated not sure what happens when she takes this  . Synthroid [Levothyroxine Sodium]     Pt stated not sure what happens when she takes this    Medications: I have reviewed the patient's current medications.  Results for orders placed or performed during the hospital encounter of 06/03/19 (from the past 48 hour(s))  Urinalysis, Routine w reflex microscopic     Status: Abnormal   Collection Time: 06/03/19  3:30 PM  Result Value Ref Range   Color, Urine STRAW (A) YELLOW   APPearance CLEAR CLEAR   Specific Gravity, Urine 1.008 1.005 - 1.030   pH 6.0 5.0 - 8.0   Glucose, UA NEGATIVE NEGATIVE mg/dL   Hgb urine dipstick NEGATIVE NEGATIVE   Bilirubin Urine NEGATIVE NEGATIVE   Ketones, ur NEGATIVE NEGATIVE mg/dL   Protein, ur NEGATIVE NEGATIVE mg/dL   Nitrite NEGATIVE NEGATIVE   Leukocytes,Ua NEGATIVE NEGATIVE    Comment: Performed at  [facility], [place] [address], [place], [place] [address]  CBC with Differential     Status: None   Collection Time: 06/03/19  4:09 PM  Result Value Ref Range   WBC 9.1 4.0 - 10.5 K/uL   RBC 4.80 3.87 - 5.11 MIL/uL   Hemoglobin 14.5 12.0 - 15.0 g/dL   HCT 43.6 36.0 - 46.0 %   MCV 90.8 80.0 - 100.0 fL   MCH 30.2 26.0 - 34.0 pg   MCHC 33.3 30.0 - 36.0 g/dL   RDW 12.7 11.5 - 15.5 %   Platelets 282 150 - 400 K/uL   nRBC 0.0 0.0 - 0.2 %   Neutrophils Relative % 63 %   Neutro Abs 5.8 1.7 - 7.7 K/uL   Lymphocytes Relative 21 %   Lymphs Abs 1.9 0.7 - 4.0 K/uL   Monocytes Relative 10 %   Monocytes Absolute 0.9 0.1 - 1.0 K/uL   Eosinophils Relative 5 %   Eosinophils Absolute 0.5 0.0 - 0.5 K/uL   Basophils Relative  1 %   Basophils Absolute 0.1 0.0 - 0.1 K/uL   Immature Granulocytes 0 %   Abs Immature Granulocytes 0.01 0.00 - 0.07 K/uL    Comment: Performed at [facility], [address] [place], [place] [id number]  Basic metabolic panel     Status: Abnormal   Collection Time: 06/03/19  4:09 PM  Result Value Ref Range   Sodium 137 135 - 145 mmol/L   Potassium 3.9 3.5 - 5.1 mmol/L   Chloride 102 98 - 111 mmol/L   CO2 21 (L) 22 - 32 mmol/L   Glucose, Bld 101 (H) 70 - 99 mg/dL    Comment: Glucose reference range applies only to samples taken after fasting for at least 8 hours.   BUN 16 8 - 23 mg/dL   Creatinine, Ser 0.82 0.44 - 1.00 mg/dL   Calcium 9.3 8.9 - 10.3 mg/dL   GFR calc non Af Amer >60 >60 mL/min   GFR calc Af Amer >60 >60 mL/min   Anion gap 14 5 - 15    Comment: Performed at [facility] [address], [place], [place] [address]    No results found.  Review of Systems Blood pressure (!) 187/57, pulse 61, temperature 97.8 F (36.6 C), temperature source Oral, resp. rate 16, SpO2 100 %. Physical Exam  Healthy appearing elderly female in moderate distress who is reclining on an ED stretcher with her hard cervical collar on. Collar fits appropriately. She is alert and oriented X 3. Right shoulder with moderate generalized myofascial tenderness and limited ROM due to pain.  She has good 5/5 deltoid strength against resistance on the right with some pain. Good ER and IR strength against resistance. Elbow and wrist ROM normal.  Left shoulder with pain free AROM. Mild to moderate tenderness in the upper T spine but below that no tenderness. No step offs or crepitance with palpation. Bilateral hip knee and ankle ROM pain free. Good distal pulses. Sensation intact x 4 extremities.   CT Results:  Shoulder from 05/15/19 - old malunited greater tuberosity fracture and glenoid fracture with a basically concentric and reasonably aligned gleno-humeral joint. Rotator cuff grossly intact  by CT  C-Spine 05/15/19: C4 and C 5 cervical fractures with 3.5 anterolisthesis of 4 on 5. Multilevel degenerative changes   Assessment/Plan: 86 year old previously independent female who is s/p fall with cervical fractures requiring hard cervical collar immobilization who is  unable to care for herself at home and reports increasing pain. At this point she is neuro stable. No signs of myelopathy. Discussed with Dr Rolena Infante who is aware of the situation and will consult while patient in the hospital.  Dr Rolena Infante requests repeat CT C spine to make sure that her spine remains aligned and that there is no additional displacement. Plan remains non surgical at this point Pain management and careful observation  Augustin Schooling 06/03/2019, 7:32 PM

## 2019-06-03 NOTE — H&P (Signed)
History and Physical   Megan Pitts [id number] DOB: 19-Dec-1927 DOA: 06/03/2019  Referring MD/NP/PA: Dr. Wilson Singer  PCP: Marton Redwood, MD   Outpatient Specialists: Dr. Rolena Infante, orthopedics  Patient coming from: Home  Chief Complaint: Neck pain  HPI: Megan Pitts is a 86 y.o. female with medical history significant of A. fib, aortic stenosis, GERD, Gilbert's syndrome, hyperlipidemia, hypertension, previous vertigo who had a fall about 3 weeks ago.  At that time she had a CT of the cervical spine showed an acute fracture of the right inferior articular facet of C4 that extend into the joint.  Also cervical stenosis.  Patient was seen by Dr. Rolena Infante of [facility] at the time.  She has an Designer, multimedia in place and conservative management was started.  Patient continues to have significant pain and worsening.  Her niece that lives with her has noted her decline and not able to do any ADLs.  She call Dr. Rolena Infante who referred patient to the ER.  She is weak and having difficulty was working.  She is still has pain today 6 out of 10.  Denied any fever or chills.  Patient is generally getting weaker.  Based on her current status she is being admitted to the hospital and will be seen by Dr. Rolena Infante and possible placement.  She denied any fever or chills.  No exposure to any COVID-19..  ED Course: Temperature 97.8 blood pressure 190/51 pulse 77 respiratory rate of 17 oxygen sat 96% room air.  CBC and chemistry largely within normal.  COVID-19 is negative.  Urinalysis also negative.  CT of the right shoulder showed interval healing of right C4 fracture and stable displaced right C5 facet structure.  Patient being admitted for pain control PT OT and possible placement.  Review of Systems: As per HPI otherwise 10 point review of systems negative.    Past Medical History:  Diagnosis Date  . A-fib ([place])   . Aortic stenosis    s/p AVR 07/2005(porcine)  . Carotid bruit 09/2008   <50% B  . GERD  (gastroesophageal reflux disease)   . Gilbert's syndrome   . Hyperlipidemia   . Hypertension   . IFG (impaired fasting glucose)   . OAB (overactive bladder)   . Osteopenia   . Phlebitis    UPPER EXTREMITY  . PVC's (premature ventricular contractions)   . Shingles   . Varicose veins    s/p treatment   . Vertigo     Past Surgical History:  Procedure Laterality Date  . ABDOMINAL HYSTERECTOMY  1963   partial  . AORTIC VALVE REPLACEMENT  08/01/2005   WITH A #23MM [place] STENTLESS PORCINE AORTIC VALVE  . CARDIAC CATHETERIZATION  07/25/2005   EF 60% THE AORTIC VALVE IS HEAVILY CALCIFIED WITH REDUCED OPENING. NO MVP  . FOOT SURGERY    . HAND SURGERY    . HEMORRHOID SURGERY  1980's  . US ECHOCARDIOGRAPHY  04/14/2008   EF 55-60%. NORMAL  . US ECHOCARDIOGRAPHY  11/21/2005   EF 55-60%  . US ECHOCARDIOGRAPHY  07/20/2005   EF 55-60%  . US ECHOCARDIOGRAPHY  10/19/2004   EF 55-60%  . VARICOSE VEIN SURGERY  2010   laser treatment     reports that she has never smoked. She has never used smokeless tobacco. She reports that she does not drink alcohol or use drugs.  Allergies  Allergen Reactions  . Synthroid [Levothyroxine Sodium]     Pt stated not sure what happens when she takes  this    Family History  Problem Relation Age of Onset  . Pneumonia Mother 34  . Alzheimer's disease Father   . Leukemia Brother   . Alcohol abuse Brother      Prior to Admission medications   Medication Sig Start Date End Date Taking? Authorizing Provider  acetaminophen (TYLENOL) 500 MG tablet Take 500 mg by mouth every 6 (six) hours as needed for mild pain.   Yes [provider]  amLODipine (NORVASC) 10 MG tablet Take 10 mg by mouth daily. 03/19/19  Yes [provider]  aspirin 81 MG tablet Take 81 mg by mouth daily.     Yes [provider]  donepezil (ARICEPT) 10 MG tablet Take 10 mg by mouth at bedtime.  01/11/15  Yes [provider]  hydrochlorothiazide  (HYDRODIURIL) 25 MG tablet Take 25 mg by mouth daily. 03/19/19  Yes [provider]  olmesartan (BENICAR) 40 MG tablet Take 40 mg by mouth daily.   Yes [provider]  Vitamin D, Ergocalciferol, (DRISDOL) 50000 units CAPS capsule Take 50,000 Units by mouth every 7 (seven) days. On Sundays   Yes [provider]  ibuprofen (ADVIL,MOTRIN) 400 MG tablet Take 1 tablet (400 mg total) by mouth every 6 (six) hours as needed for mild pain or moderate pain. Patient not taking: Reported on 06/03/2019 09/29/17   Deatra James, MD    Physical Exam: Vitals:   06/03/19 1143 06/03/19 1754  BP: (!) 190/51 (!) 187/57  Pulse: 77 61  Resp: 14 16  Temp: 98 F (36.7 C) 97.8 F (36.6 C)  TempSrc: Oral Oral  SpO2: 98% 100%      Constitutional: Frail, weak, no distress Vitals:   06/03/19 1143 06/03/19 1754  BP: (!) 190/51 (!) 187/57  Pulse: 77 61  Resp: 14 16  Temp: 98 F (36.7 C) 97.8 F (36.6 C)  TempSrc: Oral Oral  SpO2: 98% 100%   Eyes: PERRL, lids and conjunctivae normal ENMT: Mucous membranes are moist. Posterior pharynx clear of any exudate or lesions.Normal dentition.  Neck: Aspen collar in place, decreased range of motion no masses, no thyromegaly Respiratory: clear to auscultation bilaterally, no wheezing, no crackles. Normal respiratory effort. No accessory muscle use.  Cardiovascular: Regular rate and rhythm, no murmurs / rubs / gallops. No extremity edema. 2+ pedal pulses. No carotid bruits.  Abdomen: no tenderness, no masses palpated. No hepatosplenomegaly. Bowel sounds positive.  Musculoskeletal: no clubbing / cyanosis. No joint deformity upper and lower extremities. Good ROM, no contractures. Normal muscle tone.  Skin: no rashes, lesions, ulcers. No induration Neurologic: CN 2-12 grossly intact. Sensation intact, DTR normal. Strength 5/5 in all 4.  Psychiatric: Confused, no significant distress   Labs on Admission: I have personally reviewed  following labs and imaging studies  CBC: Recent Labs  Lab 06/03/19 1609  WBC 9.1  NEUTROABS 5.8  HGB 14.5  HCT 43.6  MCV 90.8  PLT [id number]   Basic Metabolic Panel: Recent Labs  Lab 06/03/19 1609  NA 137  K 3.9  CL 102  CO2 21*  GLUCOSE 101*  BUN 16  CREATININE 0.82  CALCIUM 9.3   GFR: CrCl cannot be calculated (Unknown ideal weight.). Liver Function Tests: No results for input(s): AST, ALT, ALKPHOS, BILITOT, PROT, ALBUMIN in the last 168 hours. No results for input(s): LIPASE, AMYLASE in the last 168 hours. No results for input(s): AMMONIA in the last 168 hours. Coagulation Profile: No results for input(s): INR, PROTIME in the last  168 hours. Cardiac Enzymes: No results for input(s): CKTOTAL, CKMB, CKMBINDEX, TROPONINI in the last 168 hours. BNP (last 3 results) No results for input(s): PROBNP in the last 8760 hours. HbA1C: No results for input(s): HGBA1C in the last 72 hours. CBG: No results for input(s): GLUCAP in the last 168 hours. Lipid Profile: No results for input(s): CHOL, HDL, LDLCALC, TRIG, CHOLHDL, LDLDIRECT in the last 72 hours. Thyroid Function Tests: No results for input(s): TSH, T4TOTAL, FREET4, T3FREE, THYROIDAB in the last 72 hours. Anemia Panel: No results for input(s): VITAMINB12, FOLATE, FERRITIN, TIBC, IRON, RETICCTPCT in the last 72 hours. Urine analysis:    Component Value Date/Time   COLORURINE STRAW (A) 06/03/2019 1530   APPEARANCEUR CLEAR 06/03/2019 1530   LABSPEC 1.008 06/03/2019 1530   PHURINE 6.0 06/03/2019 1530   GLUCOSEU NEGATIVE 06/03/2019 1530   HGBUR NEGATIVE 06/03/2019 1530   BILIRUBINUR NEGATIVE 06/03/2019 1530   KETONESUR NEGATIVE 06/03/2019 1530   PROTEINUR NEGATIVE 06/03/2019 1530   NITRITE NEGATIVE 06/03/2019 1530   LEUKOCYTESUR NEGATIVE 06/03/2019 1530   Sepsis Labs: @LABRCNTIP (procalcitonin:4,lacticidven:4) )No results found for this or any previous visit (from the past 240 hour(s)).   Radiological Exams on  Admission: No results found.    Assessment/Plan Principal Problem:   Cervical spine fracture (HCC) Active Problems:   Hypertension   Hyperlipidemia   Chronic diastolic CHF (congestive heart failure) ([place])     #1 cervical spine fracture: Appears to be partially healing.  Patient however is still having an Aspen collar in place with significant pain with mobility.  Admit the patient.  PT OT consultation.  Orthopedic consultation.  Possible placement as patient is unable to care for herself.  #2 hypertension: Confirm and resume home regimen.  #3 hyperlipidemia: Confirm and resume home regimen.  #4 chronic diastolic CHF: Optimize home therapy.  #5 generalized debility: Begin PT consultation.   DVT prophylaxis: Lovenox Code Status: Full code Family Communication: Niece at bedside Disposition Plan: Possibly SNF Consults called: Dr. Rolena Infante orthopedic surgery Admission status: Inpatient  Severity of Illness: The appropriate patient status for this patient is INPATIENT. Inpatient status is judged to be reasonable and necessary in order to provide the required intensity of service to ensure the patient's safety. The patient's presenting symptoms, physical exam findings, and initial radiographic and laboratory data in the context of their chronic comorbidities is felt to place them at high risk for further clinical deterioration. Furthermore, it is not anticipated that the patient will be medically stable for discharge from the hospital within 2 midnights of admission. The following factors support the patient status of inpatient.   " The patient's presenting symptoms include generalized weakness and neck pain. " The worrisome physical exam findings include decreased range of motion of the neck and right shoulder tenderness. " The initial radiographic and laboratory data are worrisome because of showing healing C4 fractures. " The chronic co-morbidities include hypertension.   * I  certify that at the point of admission it is my clinical judgment that the patient will require inpatient hospital care spanning beyond 2 midnights from the point of admission due to high intensity of service, high risk for further deterioration and high frequency of surveillance required.Barbette Merino MD [facility] Pager [phone]  If 7PM-7AM, please contact night-coverage [email or web address] Password [facility]  06/03/2019, 8:01 PM

## 2019-06-03 NOTE — ED Notes (Signed)
Patient provided with food and drink.

## 2019-06-03 NOTE — ED Notes (Signed)
Patient ambulatory to bathroom with stand by assist. Patient able to ambulate independently.

## 2019-06-03 NOTE — Evaluation (Addendum)
Physical Therapy Evaluation Patient Details Name: Megan Pitts MRN: [id number] DOB: 03/14/1927 Today's Date: 06/03/2019   History of Present Illness  Pt is 86 yo female who presented to the ED with neck pain. Pt with recent fall on 5/7/21with  acute fracture of R inferior articular facet of C4 and superior articular facet of C5.  This has been managed conservatively as outpt with Aspen collar.  Pt's niece has been staying with her but now has to return home.  They are reporting that pt has been declining in function over the past 2 weeks and are interested in rehab placement. Pt with PMH including afib, aortic valve replacement, GERD, HTN, vertigo, and prior R proximal humerus fx with shoulder dislocation in 2019 (family reports this was reimaged with recent fall and may not have healed completely).  Clinical Impression   Pt admitted with above diagnosis. From PT perspective, pt is unsafe to be home alone.  Due to current level of pain, mobility precautions/restrictions, and weaknesses pt would have difficulty completing basic ADLs and is unsafe with walking.  Pt is high fall risk if alone.   Pt's family reporting that her mobility has been declining over the past couple of weeks and are interested in SNF placement.  Pt does have decreased mobility and would benefit from skilled PT to address.  Recommend SNF at d/c as pt does not have 24 hr support. Pt currently with functional limitations due to the deficits listed below (see PT Problem List). Pt will benefit from skilled PT to increase their independence and safety with mobility to allow discharge to the venue listed below.       Follow Up Recommendations SNF;Supervision/Assistance - 24 hour    Equipment Recommendations       Recommendations for Other Services       Precautions / Restrictions Precautions Precautions: Fall;Cervical Precaution Booklet Issued: No Precaution Comments: Pt able to recall precautions and describe what activities  she is not allowed to do b/c of precautions Required Braces or Orthoses: Cervical Brace(Aspen) Cervical Brace: At all times      Mobility  Bed Mobility Overal bed mobility: Needs Assistance Bed Mobility: Supine to Sit;Sit to Supine     Supine to sit: Mod assist Sit to supine: Mod assist   General bed mobility comments: mod A to lift trunk and scoot to edge; mod A for legs back into bed  Transfers Overall transfer level: Needs assistance Equipment used: Rolling walker (2 wheeled) Transfers: Sit to/from Stand Sit to Stand: Min assist         General transfer comment: cues for safe hand placementt  Ambulation/Gait Ambulation/Gait assistance: Min assist Gait Distance (Feet): 50 Feet Assistive device: 1 person hand held assist Gait Pattern/deviations: Step-to pattern;Shuffle;Trunk flexed Gait velocity: decreased   General Gait Details: Pt moderately unsteady requiring min A for balance; fatigued easily; shuffle gait; cues for posture  Stairs            Wheelchair Mobility    Modified Rankin (Stroke Patients Only)       Balance Overall balance assessment: Needs assistance Sitting-balance support: Bilateral upper extremity supported Sitting balance-Leahy Scale: Fair     Standing balance support: Single extremity supported Standing balance-Leahy Scale: Fair                               Pertinent Vitals/Pain Pain Assessment: 0-10 Pain Score: 6 (6/10 rest; shooting pain 9/10 with R  UE movement and R Leg ext and DF) Pain Location: posterior neck and into R UE Pain Descriptors / Indicators: Discomfort;Sore;Shooting;Sharp;Nagging Pain Intervention(s): Limited activity within patient's tolerance;Monitored during session;Repositioned;Relaxation    Home Living Family/patient expects to be discharged to:: SNF Living Arrangements: Alone Available Help at Discharge: Family;Available PRN/intermittently Type of Home: House Home Access: Level entry      Home Layout: One level Home Equipment: Walker - 2 wheels;Shower seat Additional Comments: Pt lives alone but niece has been spending the past 2 weeks with her but now has to return home to [place].  Pt's daughter can assist intermittently but works full time.    Prior Function Level of Independence: Independent         Comments: Pt was independent with IADLs (light cooking, cleaning) and ADLs prior to recent fall.  Since her fall she has been limited due to neck pain and precautions.  Niece has been assisting with ADLs, walking, and IADLs.  Her and daughter reports that pt with declining function over the past 2 weeks - having difficulty walking and performing ADls. Daughter also reports some cognitive and safety declines.     Hand Dominance        Extremity/Trunk Assessment   Upper Extremity Assessment Upper Extremity Assessment: LUE deficits/detail;RUE deficits/detail RUE Deficits / Details: ROM: shoulder elevation limited to 90 degrees due to pain - otherwise WFL; MMT: not fully tested due to neck precautions, pt able to provide some resistance but less than L side RUE Sensation: (reports tingling at times) LUE Deficits / Details: ROM WFL, MMT: not fully tested due to neck precautions, pt able to provide some resistance    Lower Extremity Assessment Lower Extremity Assessment: LLE deficits/detail;RLE deficits/detail RLE Deficits / Details: ROM WFL; MMT 4/5; pt with pain shooting from neck to leg with knee ext and DF RLE Sensation: (reports tingling at times) LLE Deficits / Details: ROM WFL; MMT 4+/5    Cervical / Trunk Assessment Cervical / Trunk Assessment: Other exceptions Cervical / Trunk Exceptions: cervical -not tested due to precautions, in Aspen brace  Communication   Communication: No difficulties  Cognition Arousal/Alertness: Awake/alert Behavior During Therapy: WFL for tasks assessed/performed Overall Cognitive Status: Within Functional Limits for tasks  assessed                                 General Comments: mild HOH      General Comments General comments (skin integrity, edema, etc.): VSS    Exercises     Assessment/Plan    PT Assessment Patient needs continued PT services  PT Problem List Decreased strength;Decreased mobility;Decreased safety awareness;Decreased range of motion;Decreased coordination;Decreased knowledge of precautions;Decreased activity tolerance;Decreased balance;Decreased knowledge of use of DME;Pain       PT Treatment Interventions DME instruction;Therapeutic exercise;Gait training;Balance training;Stair training;Functional mobility training;Therapeutic activities;Patient/family education;Modalities    PT Goals (Current goals can be found in the Care Plan section)  Acute Rehab PT Goals Patient Stated Goal: short term rehab then home; improve pain PT Goal Formulation: With patient/family Time For Goal Achievement: 06/17/19 Potential to Achieve Goals: Good    Frequency Min 3X/week   Barriers to discharge Decreased caregiver support      Co-evaluation               AM-PAC PT "6 Clicks" Mobility  Outcome Measure Help needed turning from your back to your side while in a flat bed without using bedrails?:  A Little Help needed moving from lying on your back to sitting on the side of a flat bed without using bedrails?: A Lot Help needed moving to and from a bed to a chair (including a wheelchair)?: A Little Help needed standing up from a chair using your arms (e.g., wheelchair or bedside chair)?: A Little Help needed to walk in hospital room?: A Little Help needed climbing 3-5 steps with a railing? : A Lot 6 Click Score: 16    End of Session Equipment Utilized During Treatment: Gait belt Activity Tolerance: Patient tolerated treatment well Patient left: in bed;with family/visitor present(pt in hallway at ED (no call bell()) Nurse Communication: Mobility status PT Visit  Diagnosis: Unsteadiness on feet (R26.81);History of falling (Z91.81);Muscle weakness (generalized) (M62.81);Pain Pain - part of body: (neck and R shoulder)    Time: [id number] PT Time Calculation (min) (ACUTE ONLY): 26 min   Charges:   PT Evaluation $PT Eval Moderate Complexity: 1 Mod          Maggie Font, PT Acute Rehab Services Pager [phone] [facility] [phone] [facility] [phone]   Karlton Lemon 06/03/2019, 5:48 PM

## 2019-06-03 NOTE — ED Notes (Signed)
PT at bedside.

## 2019-06-03 NOTE — ED Triage Notes (Signed)
Pt sent here by doctor for admission for pain control of neck fracture and physical therapy. Pt broke her neck 2 weeks ago (aspen collar in place). Pt alternating motrin and tylenol for pain. Pt unable to take care of herself at home due to the pain and needs physical therapy. Pt a.o, pain 9/10 at this time

## 2019-06-03 NOTE — ED Provider Notes (Signed)
[facility] EMERGENCY DEPARTMENT Provider Note   CSN: [id number] Arrival date & time: 06/03/19  1050     History Chief Complaint  Patient presents with  . Neck Pain    Megan Pitts is a 86 y.o. female.  HPI  31yF with progressive functional decline. Had fall ~3w ago.  Had CT of cervical spine on 05/16/19 as follows:   IMPRESSION: 1. Acute fractures of the right inferior articular facet of C4 extending into the facet joint, and the right superior articular facet of C5 extending into the facet joint. This is associated with some increased anterolisthesis at C4-5, currently 3.5 mm and previously 2.0 mm. 2. Osseous foraminal stenosis on the right at C4-5, C5-6, and C6-7; and on the left at C3-4, C4-5, C5-6, C6-7; and on the left at C3-4, C4-5, C5-6, C6-7; and on the left at C3-4, C4-5, C5-6, C6-7, C6-7, primarily from spurring. 3. Bilateral common carotid atherosclerotic calcification.  Has been seen by Dr. Rolena Infante of [facility].  Plan was for conservative management.  She has been in an [employer].  She has had continued significant pain.  She has been alternating Tylenol and ibuprofen.  Patient lives by herself.  Her niece has been staying with her for the past couple weeks.  They were hoping that she would have progressed more than she has at this point.  Her niece now has to leave and there is concern about patient's ability to care for herself.  She is still requiring significant assistance with her ADLs.  She has now become increasingly weak and having difficulty with walking.  Family thinks this is because she is become generally weak from mostly sitting around all day.  She denies any new injuries.  Past Medical History:  Diagnosis Date  . A-fib ([place])   . Aortic stenosis    s/p AVR 07/2005(porcine)  . Carotid bruit 09/2008   <50% B  . GERD (gastroesophageal reflux disease)   . Gilbert's syndrome   . Hyperlipidemia   . Hypertension   . IFG  (impaired fasting glucose)   . OAB (overactive bladder)   . Osteopenia   . Phlebitis    UPPER EXTREMITY  . PVC's (premature ventricular contractions)   . Shingles   . Varicose veins    s/p treatment   . Vertigo     Patient Active Problem List   Diagnosis Date Noted  . Syncope, vasovagal 09/27/2017  . Syncope and collapse 09/26/2017  . Fracture dislocation of right shoulder joint 09/24/2017  . Shoulder fracture, right 09/24/2017  . Chronic diastolic CHF (congestive heart failure) ([place]) 09/24/2017  . Leucocytosis 09/24/2017  . Cellulitis 05/05/2015  . S/P AVR 10/26/2010  . Aortic stenosis   . Hypertension   . PVC's (premature ventricular contractions)   . Hyperlipidemia     Past Surgical History:  Procedure Laterality Date  . ABDOMINAL HYSTERECTOMY  1963   partial  . AORTIC VALVE REPLACEMENT  08/01/2005   WITH A #23MM [place] STENTLESS PORCINE AORTIC VALVE  . CARDIAC CATHETERIZATION  07/25/2005   EF 60% THE AORTIC VALVE IS HEAVILY CALCIFIED WITH REDUCED OPENING. NO MVP  . FOOT SURGERY    . HAND SURGERY    . HEMORRHOID SURGERY  1980's  . US ECHOCARDIOGRAPHY  04/14/2008   EF 55-60%. NORMAL  . US ECHOCARDIOGRAPHY  11/21/2005   EF 55-60%  . US ECHOCARDIOGRAPHY  07/20/2005   EF 55-60%  . US ECHOCARDIOGRAPHY  10/19/2004   EF 55-60%  .  VARICOSE VEIN SURGERY  2010   laser treatment     OB History   No obstetric history on file.     Family History  Problem Relation Age of Onset  . Pneumonia Mother 93  . Alzheimer's disease Father   . Leukemia Brother   . Alcohol abuse Brother     Social History   Tobacco Use  . Smoking status: Never Smoker  . Smokeless tobacco: Never Used  Substance Use Topics  . Alcohol use: No  . Drug use: No    Home Medications Prior to Admission medications   Medication Sig Start Date End Date Taking? Authorizing Provider  acetaminophen (TYLENOL) 500 MG tablet Take 500 mg by mouth every 6 (six) hours as needed for mild pain.     [provider]  aspirin 81 MG tablet Take 81 mg by mouth daily.      [provider]  donepezil (ARICEPT) 10 MG tablet Take 10 mg by mouth at bedtime.  01/11/15   [provider]  ibuprofen (ADVIL,MOTRIN) 400 MG tablet Take 1 tablet (400 mg total) by mouth every 6 (six) hours as needed for mild pain or moderate pain. 09/29/17   Deatra James, MD  Multiple Vitamin (MULTIVITAMIN) tablet Take 1 tablet by mouth daily.    [provider]  olmesartan (BENICAR) 40 MG tablet Take 40 mg by mouth daily.    [provider]  oxybutynin (DITROPAN-XL) 10 MG 24 hr tablet Take 10 mg by mouth at bedtime.  02/07/17   [provider]  Vitamin D, Ergocalciferol, (DRISDOL) 50000 units CAPS capsule Take 50,000 Units by mouth every 7 (seven) days. On Sundays    [provider]    Allergies    Levothyroxine and Synthroid [levothyroxine sodium]  Review of Systems   Review of Systems All systems reviewed and negative, other than as noted in HPI.  Physical Exam Updated Vital Signs BP (!) 190/51 (BP Location: Right Arm)   Pulse 77   Temp 98 F (36.7 C) (Oral)   Resp 14   SpO2 98%   Physical Exam Vitals and nursing note reviewed.  Constitutional:      General: She is not in acute distress.    Appearance: She is well-developed.  HENT:     Head: Normocephalic and atraumatic.  Eyes:     General:        Right eye: No discharge.        Left eye: No discharge.     Conjunctiva/sclera: Conjunctivae normal.  Cardiovascular:     Rate and Rhythm: Normal rate and regular rhythm.     Heart sounds: Normal heart sounds. No murmur. No friction rub. No gallop.   Pulmonary:     Effort: Pulmonary effort is normal. No respiratory distress.     Breath sounds: Normal breath sounds.  Abdominal:     General: There is no distension.     Palpations: Abdomen is soft.     Tenderness: There is no abdominal tenderness.  Musculoskeletal:     Cervical back: Neck  supple.     Comments: Cervical collar. Strength 5/5 b/l ue.   Skin:    General: Skin is warm and dry.  Neurological:     Mental Status: She is alert and oriented to person, place, and time.  Psychiatric:        Behavior: Behavior normal.        Thought Content: Thought content normal.     ED Results /  Procedures / Treatments   Labs (all labs ordered are listed, but only abnormal results are displayed) Labs Reviewed  BASIC METABOLIC PANEL - Abnormal; Notable for the following components:      Result Value   CO2 21 (*)    Glucose, Bld 101 (*)    All other components within normal limits  URINALYSIS, ROUTINE W REFLEX MICROSCOPIC - Abnormal; Notable for the following components:   Color, Urine STRAW (*)    All other components within normal limits  COMPREHENSIVE METABOLIC PANEL - Abnormal; Notable for the following components:   Potassium 3.4 (*)    Total Protein 6.2 (*)    Albumin 3.4 (*)    Total Bilirubin 1.3 (*)    All other components within normal limits  BASIC METABOLIC PANEL - Abnormal; Notable for the following components:   Sodium 134 (*)    CO2 21 (*)    Glucose, Bld 175 (*)    Creatinine, Ser 1.05 (*)    GFR calc non Af Amer 47 (*)    GFR calc Af Amer 55 (*)    All other components within normal limits  SARS CORONAVIRUS 2 (TAT 6-24 HRS)  SARS CORONAVIRUS 2 BY RT PCR (HOSPITAL ORDER, [place] [facility])  CBC WITH DIFFERENTIAL/PLATELET  CBC  CBC WITH DIFFERENTIAL/PLATELET    EKG None  Radiology No results found.  Procedures Procedures (including critical care time)  Medications Ordered in ED Medications  acetaminophen (TYLENOL) tablet 650 mg (650 mg Oral Given 06/03/19 1157)    ED Course  I have reviewed the triage vital signs and the nursing notes.  Pertinent labs & imaging results that were available during my care of the patient were reviewed by me and considered in my medical decision making (see chart for details).    MDM  Rules/Calculators/A&P                      89yF with recent cervical fracture. Continued significant symptoms despite conservative management after several weeks. No new injury or neurological complaints. Lives alone normally, still needing significant assistance and her niece that has been staying with her has to leave. Family feels like she has become increasingly weak, likely from deconditioning. Needs additional help, likely placement for acute rehab. Will get PT recs.   Final Clinical Impression(s) / ED Diagnoses Final diagnoses:  Closed fracture of multiple cervical vertebrae, subsequent encounter    Rx / DC Orders ED Discharge Orders    None       Virgel Manifold, MD 06/05/19 1114

## 2019-06-04 LAB — COMPREHENSIVE METABOLIC PANEL
ALT: 19 U/L (ref 0–44)
AST: 16 U/L (ref 15–41)
Albumin: 3.4 g/dL — ABNORMAL LOW (ref 3.5–5.0)
Alkaline Phosphatase: 59 U/L (ref 38–126)
Anion gap: 10 (ref 5–15)
BUN: 11 mg/dL (ref 8–23)
CO2: 23 mmol/L (ref 22–32)
Calcium: 9.2 mg/dL (ref 8.9–10.3)
Chloride: 102 mmol/L (ref 98–111)
Creatinine, Ser: 0.76 mg/dL (ref 0.44–1.00)
GFR calc Af Amer: >60 mL/min (ref >60)
GFR calc non Af Amer: >60 mL/min (ref >60)
Glucose, Bld: 99 mg/dL (ref 70–99)
Potassium: 3.4 mmol/L — ABNORMAL LOW (ref 3.5–5.1)
Sodium: 135 mmol/L (ref 135–145)
Total Bilirubin: 1.3 mg/dL — ABNORMAL HIGH (ref 0.3–1.2)
Total Protein: 6.2 g/dL — ABNORMAL LOW (ref 6.5–8.1)

## 2019-06-04 LAB — CBC
HCT: 39.6 % (ref 36.0–46.0)
Hemoglobin: 13.5 g/dL (ref 12.0–15.0)
MCH: 30 pg (ref 26.0–34.0)
MCHC: 34.1 g/dL (ref 30.0–36.0)
MCV: 88 fL (ref 80.0–100.0)
Platelets: 288 10*3/uL (ref 150–400)
RBC: 4.5 MIL/uL (ref 3.87–5.11)
RDW: 12.7 % (ref 11.5–15.5)
WBC: 8.4 10*3/uL (ref 4.0–10.5)
nRBC: 0 % (ref 0.0–0.2)

## 2019-06-04 LAB — SARS CORONAVIRUS 2 (TAT 6-24 HRS): SARS Coronavirus 2: NEGATIVE

## 2019-06-04 MED ORDER — HYDRALAZINE HCL 20 MG/ML IJ SOLN
10.0000 mg | INTRAMUSCULAR | Status: DC | PRN
  Administered 2019-06-04 – 2019-06-06 (×2): 10 mg via INTRAVENOUS
  Filled 2019-06-04 (×2): qty 1

## 2019-06-04 MED ORDER — ASPIRIN 81 MG PO CHEW
81.0000 mg | CHEWABLE_TABLET | Freq: Every day | ORAL | Status: DC
  Administered 2019-06-04 – 2019-06-06 (×3): 81 mg via ORAL
  Filled 2019-06-04 (×3): qty 1

## 2019-06-04 MED ORDER — HYDRALAZINE HCL 20 MG/ML IJ SOLN
5.0000 mg | Freq: Once | INTRAMUSCULAR | Status: AC
  Administered 2019-06-04: 5 mg via INTRAVENOUS
  Filled 2019-06-04: qty 1

## 2019-06-04 MED ORDER — HYDROCHLOROTHIAZIDE 25 MG PO TABS
25.0000 mg | ORAL_TABLET | Freq: Every day | ORAL | Status: DC
  Administered 2019-06-04 – 2019-06-06 (×3): 25 mg via ORAL
  Filled 2019-06-04 (×3): qty 1

## 2019-06-04 MED ORDER — IRBESARTAN 300 MG PO TABS
300.0000 mg | ORAL_TABLET | Freq: Every day | ORAL | Status: DC
  Administered 2019-06-04 – 2019-06-06 (×3): 300 mg via ORAL
  Filled 2019-06-04 (×3): qty 1

## 2019-06-04 MED ORDER — POTASSIUM CHLORIDE CRYS ER 20 MEQ PO TBCR
40.0000 meq | EXTENDED_RELEASE_TABLET | Freq: Once | ORAL | Status: AC
  Administered 2019-06-04: 40 meq via ORAL
  Filled 2019-06-04: qty 2

## 2019-06-04 MED ORDER — VITAMIN D (ERGOCALCIFEROL) 1.25 MG (50000 UNIT) PO CAPS: 50000.0000 [IU] | ORAL_CAPSULE | ORAL | Status: DC

## 2019-06-04 MED ORDER — DONEPEZIL HCL 10 MG PO TABS
10.0000 mg | ORAL_TABLET | Freq: Every day | ORAL | Status: DC
  Administered 2019-06-04 – 2019-06-05 (×3): 10 mg via ORAL
  Filled 2019-06-04 (×3): qty 1

## 2019-06-04 MED ORDER — AMLODIPINE BESYLATE 10 MG PO TABS
10.0000 mg | ORAL_TABLET | Freq: Every day | ORAL | Status: DC
  Administered 2019-06-04 – 2019-06-06 (×3): 10 mg via ORAL
  Filled 2019-06-04 (×3): qty 1

## 2019-06-04 MED ORDER — ACETAMINOPHEN 500 MG PO TABS: 500.0000 mg | ORAL_TABLET | Freq: Four times a day (QID) | ORAL | Status: DC | PRN

## 2019-06-04 NOTE — NC FL2 (Signed)
[place] LEVEL OF CARE SCREENING TOOL     IDENTIFICATION  Patient Name: Megan Pitts Birthdate: May 02, 1927 Sex: female Admission Date (Current Location): 06/03/2019  [facility] and [place] Number:  Herbalist and Address:  [facility], [place] [address] [place], [place] [address]      Provider Number: [id number]  Attending Physician Name and Address:  Darliss Cheney, MD  Relative Name and Phone Number:       Current Level of Care: Hospital Recommended Level of Care: [place] Prior Approval Number:    Date Approved/Denied:   PASRR Number: [id number]  Discharge Plan: SNF    Current Diagnoses: Patient Active Problem List   Diagnosis Date Noted  . Cervical spine fracture ([place]) 06/03/2019  . Syncope, vasovagal 09/27/2017  . Syncope and collapse 09/26/2017  . Fracture dislocation of right shoulder joint 09/24/2017  . Shoulder fracture, right 09/24/2017  . Chronic diastolic CHF (congestive heart failure) ([place]) 09/24/2017  . Leucocytosis 09/24/2017  . Cellulitis 05/05/2015  . S/P AVR 10/26/2010  . Aortic stenosis   . Hypertension   . PVC's (premature ventricular contractions)   . Hyperlipidemia     Orientation RESPIRATION BLADDER Height & Weight     Self, Place(fluctuating)  Normal Continent Weight: 154 lb (69.9 kg) Height:  5\' 3"  (160 cm)  BEHAVIORAL SYMPTOMS/MOOD NEUROLOGICAL BOWEL NUTRITION STATUS      Continent Diet(see discharge summary)  AMBULATORY STATUS COMMUNICATION OF NEEDS Skin   Extensive Assist Verbally Normal(wears aspen collar due to fx)                       Personal Care Assistance Level of Assistance  Bathing, Dressing, Feeding Bathing Assistance: Maximum assistance Feeding assistance: Independent Dressing Assistance: Maximum assistance     Functional Limitations Info  Sight, Hearing, Speech Sight Info: Adequate Hearing Info: Adequate Speech Info: Adequate    SPECIAL CARE  FACTORS FREQUENCY  PT (By licensed PT), OT (By licensed OT)     PT Frequency: 5x week OT Frequency: 5x week            Contractures Contractures Info: Not present    Additional Factors Info  Code Status, Allergies, Psychotropic Code Status Info: Full Code Allergies Info: Synthroid (Levothyroxine Sodium) Psychotropic Info: donepezil (ARICEPT) tablet 10 mg daily at bedtime         Current Medications (06/04/2019):  This is the current hospital active medication list Current Facility-Administered Medications  Medication Dose Route Frequency Provider Last Rate Last Admin  . 0.45 % sodium chloride infusion   Intravenous Continuous Elwyn Reach, MD 75 mL/hr at 06/03/19 2123 New Bag at 06/03/19 2123  . acetaminophen (TYLENOL) tablet 500 mg  500 mg Oral Q6H PRN Gala Romney L, MD      . amLODipine (NORVASC) tablet 10 mg  10 mg Oral Daily Elwyn Reach, MD   10 mg at 06/04/19 0809  . aspirin chewable tablet 81 mg  81 mg Oral Daily Elwyn Reach, MD   81 mg at 06/04/19 0809  . donepezil (ARICEPT) tablet 10 mg  10 mg Oral QHS Elwyn Reach, MD   10 mg at 06/04/19 0239  . enoxaparin (LOVENOX) injection 40 mg  40 mg Subcutaneous Q24H Gala Romney L, MD   40 mg at 06/03/19 2255  . hydrochlorothiazide (HYDRODIURIL) tablet 25 mg  25 mg Oral Daily Elwyn Reach, MD   25 mg at 06/04/19 0809  .  irbesartan (AVAPRO) tablet 300 mg  300 mg Oral Daily Gala Romney L, MD   300 mg at 06/04/19 0809  . ketorolac (TORADOL) 15 MG/ML injection 15 mg  15 mg Intravenous Q6H PRN Elwyn Reach, MD   15 mg at 06/04/19 0808  . ondansetron (ZOFRAN) tablet 4 mg  4 mg Oral Q6H PRN Elwyn Reach, MD       Or  . ondansetron (ZOFRAN) injection 4 mg  4 mg Intravenous Q6H PRN Gala Romney L, MD      . oxyCODONE (Oxy IR/ROXICODONE) immediate release tablet 5 mg  5 mg Oral Q4H PRN Gareth Morgan, MD      . oxyCODONE (Oxy IR/ROXICODONE) immediate release tablet 5 mg  5 mg Oral Q4H PRN  Elwyn Reach, MD      . Derrill Memo ON 06/08/2019] Vitamin D (Ergocalciferol) (DRISDOL) capsule 50,000 Units  50,000 Units Oral Q7 days Elwyn Reach, MD         Discharge Medications: Please see discharge summary for a list of discharge medications.  Relevant Imaging Results:  Relevant Lab Results:   Additional Information SS# [place], [place]

## 2019-06-04 NOTE — Progress Notes (Signed)
Marton Redwood, MD Vitals:   06/04/19 0200 06/04/19 0523  BP: (!) 190/60 (!) 181/47  Pulse: 70 66  Resp: 15 16  Temp: 97.7 F (36.5 C) 97.8 F (36.6 C)  SpO2: 96% 95%    Chief Complaint:  History: HPI: 86 yo female who presents complaining of worsening neck and right shoulder pain following a fall which resulted in cervical spine fractures and now is causing right C5 radiculopathy. Patient was evaluated initially after her fall with CT scans of the neck and shoulder which showed there cervical fractures. Plan was for non surgical care of the spine with a hard "Aspen" collar. Due to progressive pain and decreased mobility she was referred to the ER because she could not care for herself and possible/likely SNF placement.    Past Medical History:  Diagnosis Date  . A-fib ([place])   . Aortic stenosis    s/p AVR 07/2005(porcine)  . Carotid bruit 09/2008   <50% B  . GERD (gastroesophageal reflux disease)   . Gilbert's syndrome   . Hyperlipidemia   . Hypertension   . IFG (impaired fasting glucose)   . OAB (overactive bladder)   . Osteopenia   . Phlebitis    UPPER EXTREMITY  . PVC's (premature ventricular contractions)   . Shingles   . Varicose veins    s/p treatment   . Vertigo     Allergies  Allergen Reactions  . Synthroid [Levothyroxine Sodium]     Pt stated not sure what happens when she takes this    No current facility-administered medications on file prior to encounter.   Current Outpatient Medications on File Prior to Encounter  Medication Sig Dispense Refill  . acetaminophen (TYLENOL) 500 MG tablet Take 500 mg by mouth every 6 (six) hours as needed for mild pain.    Marland Kitchen amLODipine (NORVASC) 10 MG tablet Take 10 mg by mouth daily.    Marland Kitchen aspirin 81 MG tablet Take 81 mg by mouth daily.      Marland Kitchen donepezil (ARICEPT) 10 MG tablet Take 10 mg by mouth at bedtime.   10  . hydrochlorothiazide (HYDRODIURIL) 25 MG tablet Take 25 mg by mouth daily.    Marland Kitchen olmesartan (BENICAR) 40  MG tablet Take 40 mg by mouth daily.    . Vitamin D, Ergocalciferol, (DRISDOL) 50000 units CAPS capsule Take 50,000 Units by mouth every 7 (seven) days. On Sundays    . ibuprofen (ADVIL,MOTRIN) 400 MG tablet Take 1 tablet (400 mg total) by mouth every 6 (six) hours as needed for mild pain or moderate pain. (Patient not taking: Reported on 06/03/2019) 30 tablet 0    Physical Exam: Vitals:   06/04/19 0200 06/04/19 0523  BP: (!) 190/60 (!) 181/47  Pulse: 70 66  Resp: 15 16  Temp: 97.7 F (36.5 C) 97.8 F (36.6 C)  SpO2: 96% 95%   Body mass index is 27.28 kg/m.  She is alert and oriented x3. She denies any shortness of breath or chest pain at present. Neurological exam: 5/5 motor strength in the lower extremity bilaterally.  Negative Babinski test, 1+ deep tendon reflexes at the knee and Achilles.  4+/5 right deltoid strength.  Remainder of the upper extremities 5/5 motor strength.  Positive numbness and dysesthesias in the right C5 dermatome.  Negative Hoffmann test. No shoulder, elbow, wrist or hip, knee, ankle pain with isolated joint range of motion.  Image: CT Cervical Spine Wo Contrast  Result Date: 06/03/2019 CLINICAL DATA:  Follow-up  fracture EXAM: CT CERVICAL SPINE WITHOUT CONTRAST TECHNIQUE: Multidetector CT imaging of the cervical spine was performed without intravenous contrast. Multiplanar CT image reconstructions were also generated. COMPARISON:  May 15 2019 FINDINGS: Alignment: Stable. Skull base and vertebrae: There has been some interval healing of the right C4 inferior articular facet fracture. Displaced fracture through the superior right articular facet of C5 is unchanged. There is stable mild anterolisthesis at C4-C5 and retrolisthesis at C5-C6 and C6-C7. Soft tissues and spinal canal: No prevertebral fluid or swelling. No visible canal hematoma. Disc levels: Multilevel degenerative changes are stable over the short interval. Upper chest: Negative. Other: None. IMPRESSION:  Some interval healing of right C4 facet fracture. Stable appearance of displaced right C5 facet fracture. Alignment remains stable. Electronically Signed   By: Macy Mis M.D.   On: 06/03/2019 20:19   CT CERVICAL SPINE WO CONTRAST  Addendum Date: 05/15/2019   ADDENDUM REPORT: 05/15/2019 17:45 ADDENDUM: The original report was by Dr. Van Clines. The following addendum is by Dr. Van Clines: Critical Value/emergent results were called by telephone at the time of interpretation on 05/15/2019 at 5:30 pm to provider Dr. Vickki Hearing , who verbally acknowledged these results. Electronically Signed   By: Van Clines M.D.   On: 05/15/2019 17:45   Result Date: 05/15/2019 CLINICAL DATA:  Fall on 05/12/2019. Pain radiates down the right arm. EXAM: CT CERVICAL SPINE WITHOUT CONTRAST TECHNIQUE: Multidetector CT imaging of the cervical spine was performed without intravenous contrast. Multiplanar CT image reconstructions were also generated. COMPARISON:  CT cervical spine 09/24/2017 FINDINGS: Alignment: 3.5 mm anterolisthesis at C4-5, previously 2.0 mm. Skull base and vertebrae: Acute fracture of the right inferior articular facet of C4 extending into the facet joint. Acute fracture of the right superior articular facet of C5 extending into the facet joint. Both fractures are shown on images 34 through 28 of series 8. I do not observe fractures involving the pedicles or lamina. No left-sided fractures are identified. Degenerative loss of articular space between the anterior arch of C1 and the odontoid with associated spurring. Soft tissues and spinal canal: Bilateral common carotid atherosclerotic calcification. Disc levels: Osseous foraminal stenosis on the right at C4-5, C5-6, and C6-7; and on the left at C3-4, C4-5, C5-6, C6-7, primarily from spurring. Upper chest: Mild biapical pleuroparenchymal scarring. Other: No supplemental non-categorized findings. IMPRESSION: 1. Acute fractures of the right  inferior articular facet of C4 extending into the facet joint, and the right superior articular facet of C5 extending into the facet joint. This is associated with some increased anterolisthesis at C4-5, currently 3.5 mm and previously 2.0 mm. 2. Osseous foraminal stenosis on the right at C4-5, C5-6, and C6-7; and on the left at C3-4, C4-5, C5-6, C6-7; and on the left at C3-4, C4-5, C5-6, C6-7; and on the left at C3-4, C4-5, C5-6, C6-7, C6-7, primarily from spurring. 3. Bilateral common carotid atherosclerotic calcification. Radiology assistant personnel have been notified to put me in telephone contact with the referring physician or the referring physician's clinical representative in order to discuss these findings. Once this communication is established I will issue an addendum to this report for documentation purposes. Electronically Signed: By: Van Clines M.D. On: 05/15/2019 17:19   CT SHOULDER RIGHT WO CONTRAST  Result Date: 05/15/2019 CLINICAL DATA:  Fall 05/12/2018, pain in the right shoulder/arm EXAM: CT OF THE UPPER RIGHT EXTREMITY WITHOUT CONTRAST TECHNIQUE: Multidetector CT imaging of the upper right extremity was performed according to the standard protocol. COMPARISON:  CT right shoulder from 10/12/2017 FINDINGS: Bones/Joint/Cartilage The greater tuberosity fracture fragment as intervally fused with the humeral head although there continues to be flattening and deformity of the greater tuberosity region especially anteriorly. The inferior glenoid rim fragment has partially fused with the inferior glenoid neck on image 38/6, causing a somewhat spur like appearance. Linear bone fragment posteriorly in the glenohumeral joint, 0.5 cm in long axis on image 36/6. The other tiny fragments previously present are not readily seen. Degenerative AC joint spurring. Ligaments Suboptimally assessed by CT. Muscles and Tendons No regional muscular atrophy. Soft tissues Right apical pleuroparenchymal  scarring. Median sternotomy. IMPRESSION: 1. The greater tuberosity fracture fragment as intervally fused with the humeral head. The inferior glenoid rim fragment has partially fused with the inferior glenoid neck, causing a somewhat spur like appearance. 2. Linear bone fragment posteriorly in the glenohumeral joint, 0.5 cm in long axis. 3. Degenerative AC joint spurring. 4. Right apical pleuroparenchymal scarring. Electronically Signed   By: Van Clines M.D.   On: 05/15/2019 17:08    A/P: Patient was admitted yesterday because of increasing pain, mental status changes, and inability to function at home.  Her clinical exam this morning and yesterday demonstrated no new neurological deficits.  She continues to have C5 radicular pain on the right side with generalized weakness.  Her repeat CT scan demonstrates no evidence of progression or further displacement of the fracture.  At this point time I do not think surgical intervention is advisable.  I have recommended she continue in the Aspen collar and I agree with SNF discharge.  I do think she can begin ambulating with assistance.  Recommend teds SCDs for DVT prevention while she is in bed.  If any further questions or problems arise please not hesitate to contact me.  Otherwise the patient can follow-up with me in 3 weeks for reevaluation.

## 2019-06-04 NOTE — Progress Notes (Signed)
Physical Therapy Treatment Patient Details Name: Megan Pitts MRN: [id number] DOB: 03-Jun-1927 Today's Date: 06/04/2019    History of Present Illness Pt is 86 yo female who presented to the ED with neck pain. Pt with recent fall on 5/7/21with  acute fracture of R inferior articular facet of C4 and superior articular facet of C5.  This has been managed conservatively as outpt with Aspen collar.  Pt's niece has been staying with her but now has to return home.  They are reporting that pt has been declining in function over the past 2 weeks and are interested in rehab placement. Pt with PMH including afib, aortic valve replacement, GERD, HTN, vertigo, and prior R proximal humerus fx with shoulder dislocation in 2019 (family reports this was reimaged with recent fall and may not have healed completely).    PT Comments     Pt with improved pain control and able to increase activity.  Still needs assist and cues for safe transfer technique.  Had a LOB with walking requiring assist for balance.  Cont POC.  Pt remains unsafe to be home alone - needs SNF or 24 hr supv.    Follow Up Recommendations  SNF;Supervision/Assistance - 24 hour     Equipment Recommendations  None recommended by PT    Recommendations for Other Services       Precautions / Restrictions Precautions Precautions: Fall;Cervical Precaution Comments: Pt able to recall precautions Required Braces or Orthoses: Cervical Brace Cervical Brace: At all times Restrictions Weight Bearing Restrictions: No    Mobility  Bed Mobility Overal bed mobility: Needs Assistance Bed Mobility: Sidelying to Sit;Sit to Sidelying   Sidelying to sit: Min assist     Sit to sidelying: Min assist General bed mobility comments: Educated pt on log roll technique for decreased strain on neck - performed x 2 with tactile and verbal cues  Transfers Overall transfer level: Needs assistance Equipment used: Rolling walker (2 wheeled) Transfers: Sit  to/from Stand Sit to Stand: Min assist         General transfer comment: cues for safe hand placement - performed x 3 during transfers but additional for exercised  Ambulation/Gait Ambulation/Gait assistance: Min assist Gait Distance (Feet): 125 Feet Assistive device: Rolling walker (2 wheeled) Gait Pattern/deviations: Decreased stride length;Shuffle;Step-through pattern;Trunk flexed Gait velocity: decreased   General Gait Details: Cued for posture, increased foot clearance and heel strike - able to improve with repeated verbal cues; had 1 LOB requiring min A for balance   Stairs             Wheelchair Mobility    Modified Rankin (Stroke Patients Only)       Balance Overall balance assessment: Needs assistance Sitting-balance support: No upper extremity supported Sitting balance-Leahy Scale: Good     Standing balance support: Single extremity supported Standing balance-Leahy Scale: Fair Standing balance comment: required UE support                            Cognition Arousal/Alertness: Awake/alert Behavior During Therapy: WFL for tasks assessed/performed Overall Cognitive Status: Within Functional Limits for tasks assessed                                 General Comments: mild HOH      Exercises Other Exercises Other Exercises: sit to stand with cues for safe hand placement and use of L UE  since R UE painful -performed x 10 Other Exercises: standing marching and heel raises with min guard and RW x 10    General Comments General comments (skin integrity, edema, etc.): VSS.  Pt reports has not cleaned/washed her neck in 2 weeks b/c was told to keep brace on at all times.  Discussed that with assist could open front of brace while in supported supine position and have assist to wash neck.  PT assisted in washing neck while stabilizing head and reapplied brace.      Pertinent Vitals/Pain Pain Assessment: 0-10 Pain Score: 4 (started  at 2/10 in supine but 4/10 with activity) Pain Location: posterior neck and into R UE Pain Descriptors / Indicators: Discomfort;Sore;Sharp Pain Intervention(s): Limited activity within patient's tolerance;Monitored during session;Premedicated before session    Home Living                      Prior Function            PT Goals (current goals can now be found in the care plan section) Progress towards PT goals: Progressing toward goals    Frequency    Min 3X/week      PT Plan Current plan remains appropriate    Co-evaluation              AM-PAC PT "6 Clicks" Mobility   Outcome Measure  Help needed turning from your back to your side while in a flat bed without using bedrails?: A Little Help needed moving from lying on your back to sitting on the side of a flat bed without using bedrails?: A Little Help needed moving to and from a bed to a chair (including a wheelchair)?: A Little Help needed standing up from a chair using your arms (e.g., wheelchair or bedside chair)?: A Little Help needed to walk in hospital room?: A Little Help needed climbing 3-5 steps with a railing? : A Lot 6 Click Score: 17    End of Session Equipment Utilized During Treatment: Gait belt Activity Tolerance: Patient tolerated treatment well Patient left: in bed;with call bell/phone within reach;with bed alarm set Nurse Communication: Mobility status PT Visit Diagnosis: Unsteadiness on feet (R26.81);History of falling (Z91.81);Muscle weakness (generalized) (M62.81);Pain     Time: [id number] PT Time Calculation (min) (ACUTE ONLY): 28 min  Charges:  $Gait Training: 8-22 mins $Therapeutic Exercise: 8-22 mins                     Maggie Font, PT Acute Rehab Services Pager [phone] [facility] [phone] [facility] [phone]    Megan Pitts 06/04/2019, 12:23 PM

## 2019-06-04 NOTE — Progress Notes (Addendum)
PROGRESS NOTE    Megan Pitts  [id number] DOB: [birth year] DOA: 06/03/2019 PCP: Marton Redwood, MD   Brief Narrative:   Megan Pitts is a 86 y.o. female with medical history significant of A. fib, aortic stenosis, GERD, Gilbert's syndrome, hyperlipidemia, hypertension, previous vertigo who had a fall about 3 weeks ago.  At that time she had a CT of the cervical spine showed an acute fracture of the right inferior articular facet of C4 that extend into the joint.  Also cervical stenosis.  Patient was seen by Dr. Rolena Infante of [facility] orthopedics at the time.  She has an Designer, multimedia in place and conservative management was started.  Patient continues to have significant pain and worsening.  Her niece that lives with her has noted her decline and not able to do any ADLs.  She call Dr. Rolena Infante who referred patient to the ER.  She Denied any fever or chills.  Due to all of this, she was admitted under hospital service.  Orthopedics was consulted in the ED and per their recommendations, patient underwent repeat CT cervical spine which showed some interval healing of right C4 facet fracture.  Stable appearance of displaced right C5 facet fracture.  Alignment remains a stable.  Orthopedics recommended nonsurgical management and follow-up in 3 weeks.  Patient's pain is improved with current pain medications.  Assessment & Plan:   Principal Problem:   Cervical spine fracture (HCC) Active Problems:   Hypertension   Hyperlipidemia   Chronic diastolic CHF (congestive heart failure) ([place])  #1 cervical spine fracture: Pain well controlled.  CT reassuring with healing.  Continue current pain management.  Follow-up with orthopedics as outpatient in 3 weeks.  #2  Essential hypertension: Blood pressure significantly elevated despite of resuming home medications.  As needed hydralazine and if requires multiple doses then will add onto other antihypertensives.  #3 hyperlipidemia: Not on any medications.  #4  chronic diastolic CHF: Not on any medications.  She is euvolemic.  #5 generalized debility: Evaluated by PT.  Recommend SNF.  Case manager on board.  #6.  Hypokalemia: 3.4.  Replace orally.  Recheck in the morning.  DVT prophylaxis: Lovenox   Code Status: Full Code  Family Communication:  None present at bedside.  Plan of care discussed with patient in length and he verbalized understanding and agreed with it. Patient is from: Home Disposition Plan: SNF in next 1 to 2 days Barriers to discharge: Placement and elevated blood pressure  Status is: Inpatient  Remains inpatient appropriate because:Inpatient level of care appropriate due to severity of illness   Dispo: The patient is from: Home              Anticipated d/c is to: SNF              Anticipated d/c date is: 1 day              Patient currently is not medically stable to d/c.         Estimated body mass index is 27.28 kg/m as calculated from the following:   Height as of this encounter: 5\' 3"  (1.6 m).   Weight as of this encounter: 69.9 kg.      Nutritional status:               Consultants:   Orthopedics  Procedures:   None  Antimicrobials:  Anti-infectives (From admission, onward)   None         Subjective: Seen and examined.  Neck pain has improved with the pain medications.  No new complaint.  Objective: Vitals:   06/03/19 2200 06/04/19 0200 06/04/19 0523 06/04/19 1221  BP:  (!) 190/60 (!) 181/47 (!) 165/56  Pulse:  70 66 66  Resp:  15 16 16   Temp:  97.7 F (36.5 C) 97.8 F (36.6 C) (!) 97.3 F (36.3 C)  TempSrc:  Oral Oral Oral  SpO2:  96% 95% 98%  Weight: 69.9 kg     Height: 5\' 3"  (1.6 m)       Intake/Output Summary (Last 24 hours) at 06/04/2019 1351 Last data filed at 06/04/2019 0300 Gross per 24 hour  Intake 339.7 ml  Output --  Net 339.7 ml   Filed Weights   06/03/19 2200  Weight: 69.9 kg    Examination:  General exam: Appears calm and comfortable,  Aspen collar in place Respiratory system: Clear to auscultation. Respiratory effort normal. Cardiovascular system: S1 & S2 heard, RRR. No JVD, murmurs, rubs, gallops or clicks. No pedal edema. Gastrointestinal system: Abdomen is nondistended, soft and nontender. No organomegaly or masses felt. Normal bowel sounds heard. Central nervous system: Alert and oriented. No focal neurological deficits. Extremities: Symmetric 5 x 5 power. Skin: No rashes, lesions or ulcers Psychiatry: Judgement and insight appear normal. Mood & affect appropriate.    Data Reviewed: I have personally reviewed following labs and imaging studies  CBC: Recent Labs  Lab 06/03/19 1609 06/04/19 0516  WBC 9.1 8.4  NEUTROABS 5.8  --   HGB 14.5 13.5  HCT 43.6 39.6  MCV 90.8 88.0  PLT 282 [id number]   Basic Metabolic Panel: Recent Labs  Lab 06/03/19 1609 06/04/19 0516  NA 137 135  K 3.9 3.4*  CL 102 102  CO2 21* 23  GLUCOSE 101* 99  BUN 16 11  CREATININE 0.82 0.76  CALCIUM 9.3 9.2   GFR: Estimated Creatinine Clearance: 44.7 mL/min (by C-G formula based on SCr of 0.76 mg/dL). Liver Function Tests: Recent Labs  Lab 06/04/19 0516  AST 16  ALT 19  ALKPHOS 59  BILITOT 1.3*  PROT 6.2*  ALBUMIN 3.4*   No results for input(s): LIPASE, AMYLASE in the last 168 hours. No results for input(s): AMMONIA in the last 168 hours. Coagulation Profile: No results for input(s): INR, PROTIME in the last 168 hours. Cardiac Enzymes: No results for input(s): CKTOTAL, CKMB, CKMBINDEX, TROPONINI in the last 168 hours. BNP (last 3 results) No results for input(s): PROBNP in the last 8760 hours. HbA1C: No results for input(s): HGBA1C in the last 72 hours. CBG: No results for input(s): GLUCAP in the last 168 hours. Lipid Profile: No results for input(s): CHOL, HDL, LDLCALC, TRIG, CHOLHDL, LDLDIRECT in the last 72 hours. Thyroid Function Tests: No results for input(s): TSH, T4TOTAL, FREET4, T3FREE, THYROIDAB in the last 72  hours. Anemia Panel: No results for input(s): VITAMINB12, FOLATE, FERRITIN, TIBC, IRON, RETICCTPCT in the last 72 hours. Sepsis Labs: No results for input(s): PROCALCITON, LATICACIDVEN in the last 168 hours.  Recent Results (from the past 240 hour(s))  SARS CORONAVIRUS 2 (TAT 6-24 HRS) Nasopharyngeal Urine, Clean Catch     Status: None   Collection Time: 06/03/19  3:59 PM   Specimen: Urine, Clean Catch; Nasopharyngeal  Result Value Ref Range Status   SARS Coronavirus 2 NEGATIVE NEGATIVE Final    Comment: (NOTE) SARS-CoV-2 target nucleic acids are NOT DETECTED. The SARS-CoV-2 RNA is generally detectable in upper and lower respiratory specimens during the acute phase of infection. Negative  results do not preclude SARS-CoV-2 infection, do not rule out co-infections with other pathogens, and should not be used as the sole basis for treatment or other patient management decisions. Negative results must be combined with clinical observations, patient history, and epidemiological information. The expected result is Negative. Fact Sheet for Patients: SugarRoll.be Fact Sheet for Healthcare Providers: https://www.woods-mathews.com/ This test is not yet approved or cleared by the [place] FDA and  has been authorized for detection and/or diagnosis of SARS-CoV-2 by FDA under an Emergency Use Authorization (EUA). This EUA will remain  in effect (meaning this test can be used) for the duration of the COVID-19 declaration under Section 56 4(b)(1) of the Act, 21 U.S.C. section 360bbb-3(b)(1), unless the authorization is terminated or revoked sooner. Performed at [facility], [place] [address] [place], [place] [address]       Radiology Studies: CT Cervical Spine Wo Contrast  Result Date: 06/03/2019 CLINICAL DATA:  Follow-up fracture EXAM: CT CERVICAL SPINE WITHOUT CONTRAST TECHNIQUE: Multidetector CT imaging of the cervical spine was  performed without intravenous contrast. Multiplanar CT image reconstructions were also generated. COMPARISON:  May 15 2019 FINDINGS: Alignment: Stable. Skull base and vertebrae: There has been some interval healing of the right C4 inferior articular facet fracture. Displaced fracture through the superior right articular facet of C5 is unchanged. There is stable mild anterolisthesis at C4-C5 and retrolisthesis at C5-C6 and C6-C7. Soft tissues and spinal canal: No prevertebral fluid or swelling. No visible canal hematoma. Disc levels: Multilevel degenerative changes are stable over the short interval. Upper chest: Negative. Other: None. IMPRESSION: Some interval healing of right C4 facet fracture. Stable appearance of displaced right C5 facet fracture. Alignment remains stable. Electronically Signed   By: Macy Mis M.D.   On: 06/03/2019 20:19    Scheduled Meds: . amLODipine  10 mg Oral Daily  . aspirin  81 mg Oral Daily  . donepezil  10 mg Oral QHS  . enoxaparin (LOVENOX) injection  40 mg Subcutaneous Q24H  . hydrochlorothiazide  25 mg Oral Daily  . irbesartan  300 mg Oral Daily  . [START ON 06/08/2019] Vitamin D (Ergocalciferol)  50,000 Units Oral Q7 days   Continuous Infusions: . sodium chloride 75 mL/hr at 06/04/19 1213     LOS: 1 day   Time spent: 33 minutes   Darliss Cheney, MD [facility]  06/04/2019, 1:51 PM   To contact the attending provider between 7A-7P or the covering provider during after hours 7P-7A, please log into the web site [email or web address].

## 2019-06-04 NOTE — ED Provider Notes (Signed)
Received care of pt from Dr. Wilson Singer. Please see his note for history, physical and care.   Briefly, this is an 86yo female with CSpine fractures managed conservatively who presents with neck pain limiting her ability to care for herself. Labs without acute findings.  Family upset, under the impression she would be directly admitted fro SNF placement.  Discussed with Dr. Veverly Fells who is on call for Dr. Rolena Infante. He came to bedside to evaluate the patient, recommend repeat CT CSpine.  Will admit to hospitalist for pain control, SNF placement in settting of CSpine fx and Orthopedics to follow.    Gareth Morgan, MD 06/04/19 1018

## 2019-06-04 NOTE — TOC Initial Note (Signed)
Transition of Care [facility]) - Initial/Assessment Note    Patient Details  Name: Megan Pitts MRN: [id number] Date of Birth: February 16, 1927  Transition of Care [facility]) CM/SW Contact:    Alexander Mt, LCSW Phone Number: 06/04/2019, 10:22 AM  Clinical Narrative:                 CSW spoke with pt daughter [name] [phone]). Introduced self, role, reason for call. Pt from home, usually lives alone. Her niece was visiting for 3 weeks but now has to return home. Pt daughter expresses that pt had fallen and come to the hospital, when she returned home she has had a decline in function. Normally she is up ambulatory but lately has needed much more assistance.   CSW explained recommendations are currently for SNF and pt daughter agrees that a short term rehab placement may be best at this time until pt able to gain back some of the mobility she had prior to her fall. Pt daughter confirms pt has been to [facility] before and felt she had a good experience there. Received permissions to contact [facility] and other SNFs if [facility] does not have a bed, [facility] doesn't have a bed and pt daughter aware.   Expected Discharge Plan: Skilled Nursing Facility Barriers to Discharge: Continued Medical Work up, Other (comment)(Medicare Waiver)   Patient Goals and CMS Choice Patient states their goals for this hospitalization and ongoing recovery are:: for her to get stronger; be able to do more self care CMS Medicare.gov Compare Post Acute Care list provided to:: Patient Represenative (must comment)(pt daughter [name]) Choice offered to / list presented to : Adult Children  Expected Discharge Plan and Services Expected Discharge Plan: [place] In-house Referral: Clinical Social Work Discharge Planning Services: CM Consult Post Acute Care Choice: [place] Living arrangements for the past 2 months: [place]  Prior Living Arrangements/Services Living arrangements for  the past 2 months: Single Family Home Lives with:: Self Patient language and need for interpreter reviewed:: Yes(no needs) Do you feel safe going back to the place where you live?: Yes      Need for Family Participation in Patient Care: Yes (Comment)(assistance w/ daily cares) Care giver support system in place?: Yes (comment)(pt adult child/relatives) Current home services: DME Criminal Activity/Legal Involvement Pertinent to Current Situation/Hospitalization: No - Comment as needed  Activities of Daily Living Home Assistive Devices/Equipment: Gilford Rile (specify type) ADL Screening (condition at time of admission) Patient's cognitive ability adequate to safely complete daily activities?: Yes Is the patient deaf or have difficulty hearing?: Yes Does the patient have difficulty seeing, even when wearing glasses/contacts?: No Does the patient have difficulty concentrating, remembering, or making decisions?: No Patient able to express need for assistance with ADLs?: Yes Does the patient have difficulty dressing or bathing?: No Independently performs ADLs?: Yes (appropriate for developmental age) Does the patient have difficulty walking or climbing stairs?: Yes Weakness of Legs: Both Weakness of Arms/Hands: Right  Permission Sought/Granted Permission sought to share information with : Facility Sport and exercise psychologist, Family Supports Permission granted to share information with : No(pt w/ fluctuating orientation)  Share Information with NAME: [name]  Permission granted to share info w AGENCY: Publishing copy (primary choice- can send to other SNFs)  Permission granted to share info w Relationship: daughter  Permission granted to share info w Contact Information: [phone]  Emotional Assessment Appearance:: Other (Comment Required(telephonic assessment w/ daughter) Attitude/Demeanor/Rapport: Other (comment)(telephonic assessment w/ daughter) Affect (typically observed): Other  (  comment)(telephonic assessment w/ daughter) Orientation: : Oriented to Self, Oriented to Place, Fluctuating Orientation (Suspected and/or reported Sundowners) Alcohol / Substance Use: Other (comment) Psych Involvement: (n/a)  Admission diagnosis:  Cervical spine fracture ([place]) [S12.9XXA] Patient Active Problem List   Diagnosis Date Noted  . Cervical spine fracture ([place]) 06/03/2019  . Syncope, vasovagal 09/27/2017  . Syncope and collapse 09/26/2017  . Fracture dislocation of right shoulder joint 09/24/2017  . Shoulder fracture, right 09/24/2017  . Chronic diastolic CHF (congestive heart failure) ([place]) 09/24/2017  . Leucocytosis 09/24/2017  . Cellulitis 05/05/2015  . S/P AVR 10/26/2010  . Aortic stenosis   . Hypertension   . PVC's (premature ventricular contractions)   . Hyperlipidemia    PCP:  Marton Redwood, MD Pharmacy:   [facility], [place] [address] Phone: [phone] Fax: [phone]   Readmission Risk Interventions Readmission Risk Prevention Plan 06/04/2019  Post Dischage Appt Not Complete  Appt Comments pending disposition/likely SNF  Medication Screening Complete  Transportation Screening Complete  Some recent data might be hidden

## 2019-06-05 LAB — BASIC METABOLIC PANEL
Anion gap: 11 (ref 5–15)
BUN: 14 mg/dL (ref 8–23)
CO2: 21 mmol/L — ABNORMAL LOW (ref 22–32)
Calcium: 9.1 mg/dL (ref 8.9–10.3)
Chloride: 102 mmol/L (ref 98–111)
Creatinine, Ser: 1.05 mg/dL — ABNORMAL HIGH (ref 0.44–1.00)
GFR calc Af Amer: 55 mL/min — ABNORMAL LOW (ref >60)
GFR calc non Af Amer: 47 mL/min — ABNORMAL LOW (ref >60)
Glucose, Bld: 175 mg/dL — ABNORMAL HIGH (ref 70–99)
Potassium: 3.7 mmol/L (ref 3.5–5.1)
Sodium: 134 mmol/L — ABNORMAL LOW (ref 135–145)

## 2019-06-05 LAB — SARS CORONAVIRUS 2 BY RT PCR (HOSPITAL ORDER, PERFORMED IN ~~LOC~~ HOSPITAL LAB): SARS Coronavirus 2: NEGATIVE

## 2019-06-05 NOTE — Progress Notes (Signed)
PROGRESS NOTE    Megan Pitts  HER:[id number] DOB: 1927-03-10 DOA: 06/03/2019 PCP: Marton Redwood, MD   Brief Narrative:   Megan Pitts is a 86 y.o. female with medical history significant of A. fib, aortic stenosis, GERD, Gilbert's syndrome, hyperlipidemia, hypertension, previous vertigo who had a fall about 3 weeks ago.  At that time she had a CT of the cervical spine showed an acute fracture of the right inferior articular facet of C4 that extend into the joint.  Also cervical stenosis.  Patient was seen by Dr. Rolena Infante of [facility] orthopedics at the time.  She has an Designer, multimedia in place and conservative management was started.  Patient continues to have significant pain and worsening.  Her niece that lives with her has noted her decline and not able to do any ADLs.  She call Dr. Rolena Infante who referred patient to the ER.  She Denied any fever or chills.  Due to all of this, she was admitted under hospital service.  Orthopedics was consulted in the ED and per their recommendations, patient underwent repeat CT cervical spine which showed some interval healing of right C4 facet fracture.  Stable appearance of displaced right C5 facet fracture.  Alignment remains a stable.  Orthopedics recommended nonsurgical management and follow-up in 3 weeks.  Patient's pain is improved with current pain medications.  Assessment & Plan:   Principal Problem:   Cervical spine fracture (HCC) Active Problems:   Hypertension   Hyperlipidemia   Chronic diastolic CHF (congestive heart failure) ([place])  #1 cervical spine fracture: Pain well controlled.  CT reassuring with healing.  Continue current pain management.  Pain very well controlled.  Follow-up with orthopedics as outpatient in 3 weeks.  #2  Essential hypertension: Blood pressure better but still slightly elevated.  Patient was started on hydralazine as needed to be given if systolic blood pressure over 160 and patient has met that criteria at least 4 times  since this order was placed however for some reason, hydralazine was given only once yesterday around 7 PM.  Now the blood pressure is improving so I will continue current management and further adjustment according to her blood pressure in the next 24 hours.  #3 hyperlipidemia: Not on any medications.  #4 chronic diastolic CHF: Not on any medications.  She is euvolemic.  #5 generalized debility: Evaluated by PT.  Recommend SNF.  Case manager on board.  #6.  Hypokalemia: Resolved.  DVT prophylaxis: Lovenox   Code Status: Full Code  Family Communication:  None present at bedside.  Plan of care discussed with patient in length and he verbalized understanding and agreed with it. Patient is from: Home Disposition Plan: SNF tomorrow. Barriers to discharge: Placement and elevated blood pressure  Status is: Inpatient  Remains inpatient appropriate because:Inpatient level of care appropriate due to severity of illness   Dispo: The patient is from: Home              Anticipated d/c is to: SNF              Anticipated d/c date is: 1 day              Patient currently is not medically stable to d/c.         Estimated body mass index is 27.28 kg/m as calculated from the following:   Height as of this encounter: _0  (1.6 m).   Weight as of this encounter: 69.9 kg.      Nutritional  status:               Consultants:   Orthopedics  Procedures:   None  Antimicrobials:  Anti-infectives (From admission, onward)   None         Subjective: Seen and examined.  She tells me that she feels day and night difference today and has no more pain or any other complaint.  Objective: Vitals:   06/04/19 1221 06/04/19 1835 06/05/19 0101 06/05/19 0416  BP: (!) 165/56 (!) 181/57 (!) 166/56 (!) 168/66  Pulse: 66 72 71 72  Resp: _0 Temp: (!) 97.3 F (36.3 C) 98 F (36.7 C) 97.7 F (36.5 C) 98.2 F (36.8 C)  TempSrc: Oral Oral Oral   SpO2: 98% 96% 96%  97%  Weight:      Height:       No intake or output data in the 24 hours ending 06/05/19 1115 Filed Weights   06/03/19 2200  Weight: 69.9 kg    Examination:  General exam: Appears calm and comfortable, Aspen collar in place Respiratory system: Clear to auscultation. Respiratory effort normal. Cardiovascular system: S1 & S2 heard, RRR. No JVD, murmurs, rubs, gallops or clicks. No pedal edema. Gastrointestinal system: Abdomen is nondistended, soft and nontender. No organomegaly or masses felt. Normal bowel sounds heard. Central nervous system: Alert and oriented. No focal neurological deficits. Extremities: Symmetric 5 x 5 power. Skin: No rashes, lesions or ulcers.  Psychiatry: Judgement and insight appear normal. Mood & affect appropriate.    Data Reviewed: I have personally reviewed following labs and imaging studies  CBC: Recent Labs  Lab 06/03/19 1609 06/04/19 0516  WBC 9.1 8.4  NEUTROABS 5.8  --   HGB 14.5 13.5  HCT 43.6 39.6  MCV 90.8 88.0  PLT 282 395   Basic Metabolic Panel: Recent Labs  Lab 06/03/19 1609 06/04/19 0516 06/05/19 0812  NA 137 135 134*  K 3.9 3.4* 3.7  CL 102 102 102  CO2 21* 23 21*  GLUCOSE 101* 99 175*  BUN _1 CREATININE 0.82 0.76 1.05*  CALCIUM 9.3 9.2 9.1   GFR: Estimated Creatinine Clearance: 34.1 mL/min (A) (by C-G formula based on SCr of 1.05 mg/dL (H)). Liver Function Tests: Recent Labs  Lab 06/04/19 0516  AST 16  ALT 19  ALKPHOS 59  BILITOT 1.3*  PROT 6.2*  ALBUMIN 3.4*   No results for input(s): LIPASE, AMYLASE in the last 168 hours. No results for input(s): AMMONIA in the last 168 hours. Coagulation Profile: No results for input(s): INR, PROTIME in the last 168 hours. Cardiac Enzymes: No results for input(s): CKTOTAL, CKMB, CKMBINDEX, TROPONINI in the last 168 hours. BNP (last 3 results) No results for input(s): PROBNP in the last 8760 hours. HbA1C: No results for input(s): HGBA1C in the last 72  hours. CBG: No results for input(s): GLUCAP in the last 168 hours. Lipid Profile: No results for input(s): CHOL, HDL, LDLCALC, TRIG, CHOLHDL, LDLDIRECT in the last 72 hours. Thyroid Function Tests: No results for input(s): TSH, T4TOTAL, FREET4, T3FREE, THYROIDAB in the last 72 hours. Anemia Panel: No results for input(s): VITAMINB12, FOLATE, FERRITIN, TIBC, IRON, RETICCTPCT in the last 72 hours. Sepsis Labs: No results for input(s): PROCALCITON, LATICACIDVEN in the last 168 hours.  Recent Results (from the past 240 hour(s))  SARS CORONAVIRUS 2 (TAT 6-24 HRS) Nasopharyngeal Urine, Clean Catch     Status: None   Collection Time: 06/03/19  3:59 PM   Specimen: Urine,  Clean Catch; Nasopharyngeal  Result Value Ref Range Status   SARS Coronavirus 2 NEGATIVE NEGATIVE Final    Comment: (NOTE) SARS-CoV-2 target nucleic acids are NOT DETECTED. The SARS-CoV-2 RNA is generally detectable in upper and lower respiratory specimens during the acute phase of infection. Negative results do not preclude SARS-CoV-2 infection, do not rule out co-infections with other pathogens, and should not be used as the sole basis for treatment or other patient management decisions. Negative results must be combined with clinical observations, patient history, and epidemiological information. The expected result is Negative. Fact Sheet for Patients: SugarRoll.be Fact Sheet for Healthcare Providers: https://www.woods-mathews.com/ This test is not yet approved or cleared by the [place] FDA and  has been authorized for detection and/or diagnosis of SARS-CoV-2 by FDA under an Emergency Use Authorization (EUA). This EUA will remain  in effect (meaning this test can be used) for the duration of the COVID-19 declaration under Section 56 4(b)(1) of the Act, 21 U.S.C. section 360bbb-3(b)(1), unless the authorization is terminated or revoked sooner. Performed at [facility], [place] [address] [place], [place] [address]       Radiology Studies: CT Cervical Spine Wo Contrast  Result Date: 06/03/2019 CLINICAL DATA:  Follow-up fracture EXAM: CT CERVICAL SPINE WITHOUT CONTRAST TECHNIQUE: Multidetector CT imaging of the cervical spine was performed without intravenous contrast. Multiplanar CT image reconstructions were also generated. COMPARISON:  May 15 2019 FINDINGS: Alignment: Stable. Skull base and vertebrae: There has been some interval healing of the right C4 inferior articular facet fracture. Displaced fracture through the superior right articular facet of C5 is unchanged. There is stable mild anterolisthesis at C4-C5 and retrolisthesis at C5-C6 and C6-C7. Soft tissues and spinal canal: No prevertebral fluid or swelling. No visible canal hematoma. Disc levels: Multilevel degenerative changes are stable over the short interval. Upper chest: Negative. Other: None. IMPRESSION: Some interval healing of right C4 facet fracture. Stable appearance of displaced right C5 facet fracture. Alignment remains stable. Electronically Signed   By: Macy Mis M.D.   On: 06/03/2019 20:19    Scheduled Meds: . amLODipine  10 mg Oral Daily  . aspirin  81 mg Oral Daily  . donepezil  10 mg Oral QHS  . hydrochlorothiazide  25 mg Oral Daily  . irbesartan  300 mg Oral Daily  . [START ON 06/08/2019] Vitamin D (Ergocalciferol)  50,000 Units Oral Q7 days   Continuous Infusions: . sodium chloride 75 mL/hr at 06/05/19 0113     LOS: 2 days   Time spent: 28 minutes   Darliss Cheney, MD [facility]  06/05/2019, 11:15 AM   To contact the attending provider between 7A-7P or the covering provider during after hours 7P-7A, please log into the web site [email or web address].

## 2019-06-06 DIAGNOSIS — R2681 Unsteadiness on feet: Secondary | ICD-10-CM | POA: Diagnosis not present

## 2019-06-06 DIAGNOSIS — R41841 Cognitive communication deficit: Secondary | ICD-10-CM | POA: Diagnosis not present

## 2019-06-06 DIAGNOSIS — S129XXA Fracture of neck, unspecified, initial encounter: Secondary | ICD-10-CM | POA: Diagnosis not present

## 2019-06-06 DIAGNOSIS — E440 Moderate protein-calorie malnutrition: Secondary | ICD-10-CM | POA: Diagnosis not present

## 2019-06-06 DIAGNOSIS — R1312 Dysphagia, oropharyngeal phase: Secondary | ICD-10-CM | POA: Diagnosis not present

## 2019-06-06 DIAGNOSIS — S12400A Unspecified displaced fracture of fifth cervical vertebra, initial encounter for closed fracture: Secondary | ICD-10-CM | POA: Diagnosis not present

## 2019-06-06 DIAGNOSIS — Z9181 History of falling: Secondary | ICD-10-CM | POA: Diagnosis not present

## 2019-06-06 DIAGNOSIS — R278 Other lack of coordination: Secondary | ICD-10-CM | POA: Diagnosis not present

## 2019-06-06 DIAGNOSIS — S129XXD Fracture of neck, unspecified, subsequent encounter: Secondary | ICD-10-CM | POA: Diagnosis not present

## 2019-06-06 DIAGNOSIS — M6281 Muscle weakness (generalized): Secondary | ICD-10-CM | POA: Diagnosis not present

## 2019-06-06 NOTE — TOC Progression Note (Signed)
Transition of Care [facility]) - Progression Note    Patient Details  Name: Megan Pitts MRN: [id number] Date of Birth: 11-Oct-1927  Transition of Care [facility]) CM/SW [place], [place] Phone Number: 06/06/2019, 11:04 AM  Clinical Narrative:    CSW met with patient bedside. Patient is agreeable to discharging to [facility] today. Patient provided permission for CSW to contact her daughter [name]. Patty informed of discharge and will provide transportation to patient. CSW will continue to follow & assist with discharge needs.   Expected Discharge Plan: [place] Barriers to Discharge: No Barriers Identified  Expected Discharge Plan and Services Expected Discharge Plan: [place] In-house Referral: Clinical Social Work Discharge Planning Services: CM Consult Post Acute Care Choice: [place] Living arrangements for the past 2 months: Single Family Home Expected Discharge Date: 06/06/19                                     Social Determinants of Health (SDOH) Interventions    Readmission Risk Interventions Readmission Risk Prevention Plan 06/04/2019  Post Dischage Appt Not Complete  Appt Comments pending disposition/likely SNF  Medication Screening Complete  Transportation Screening Complete  Some recent data might be hidden

## 2019-06-06 NOTE — TOC Transition Note (Addendum)
Transition of Care [facility]) - CM/SW Discharge Note   Patient Details  Name: HOWARD MESSNER MRN: [id number] Date of Birth: 1927-02-19  Transition of Care [facility]) CM/SW Contact:  Jacquelynn Cree Phone Number: 06/06/2019, 11:21 AM   Clinical Narrative:    Patient will DC to: [place] Anticipated DC date: 06/06/19 Family notified: [name], daughter Transport by: Science writer   Per MD patient ready for DC to [facility]. RN, patient, patient's family, and facility notified of DC. Discharge Summary and FL2 sent to facility. RN to call report prior to discharge [phone] Room 106P). DC packet on chart. Ambulance transport requested for patient.   CSW will sign off for now as social work intervention is no longer needed. Please consult [place] again if new needs arise.    Final next level of care: Skilled Nursing Facility Barriers to Discharge: No Barriers Identified   Patient Goals and CMS Choice Patient states their goals for this hospitalization and ongoing recovery are:: for her to get stronger; be able to do more self care CMS Medicare.gov Compare Post Acute Care list provided to:: Patient Represenative (must comment) Choice offered to / list presented to : Patient  Discharge Placement              Patient chooses bed at: [facility] Patient to be transferred to facility by: [place] Name of family member notified: Patty Patient and family notified of of transfer: 06/06/19  Discharge Plan and Services In-house Referral: Clinical Social Work Discharge Planning Services: CM Consult Post Acute Care Choice: [place]                               Social Determinants of Health (SDOH) Interventions     Readmission Risk Interventions Readmission Risk Prevention Plan 06/04/2019  Post Dischage Appt Not Complete  Appt Comments pending disposition/likely SNF  Medication Screening Complete  Transportation Screening Complete  Some recent  data might be hidden

## 2019-06-06 NOTE — Plan of Care (Signed)
  Problem: Acute Rehab PT Goals(only PT should resolve) Goal: Pt Will Go Supine/Side To Sit Outcome: Adequate for Discharge Goal: Pt Will Go Sit To Supine/Side Outcome: Adequate for Discharge Goal: Patient Will Transfer Sit To/From Stand Outcome: Adequate for Discharge Goal: Pt Will Transfer Bed To Chair/Chair To Bed Outcome: Adequate for Discharge Goal: Pt Will Ambulate Outcome: Adequate for Discharge Goal: Pt Will Go Up/Down Stairs Outcome: Adequate for Discharge   Problem: Education: Goal: Knowledge of General Education information will improve Description: Including pain rating scale, medication(s)/side effects and non-pharmacologic comfort measures Outcome: Adequate for Discharge   Problem: Health Behavior/Discharge Planning: Goal: Ability to manage health-related needs will improve Outcome: Adequate for Discharge   Problem: Clinical Measurements: Goal: Ability to maintain clinical measurements within normal limits will improve Outcome: Adequate for Discharge Goal: Will remain free from infection Outcome: Adequate for Discharge Goal: Diagnostic test results will improve Outcome: Adequate for Discharge Goal: Respiratory complications will improve Outcome: Adequate for Discharge Goal: Cardiovascular complication will be avoided Outcome: Adequate for Discharge   Problem: Activity: Goal: Risk for activity intolerance will decrease Outcome: Adequate for Discharge   Problem: Nutrition: Goal: Adequate nutrition will be maintained Outcome: Adequate for Discharge   Problem: Coping: Goal: Level of anxiety will decrease Outcome: Adequate for Discharge   Problem: Elimination: Goal: Will not experience complications related to bowel motility Outcome: Adequate for Discharge Goal: Will not experience complications related to urinary retention Outcome: Adequate for Discharge   Problem: Pain Managment: Goal: General experience of comfort will improve Outcome: Adequate for  Discharge   Problem: Safety: Goal: Ability to remain free from injury will improve Outcome: Adequate for Discharge   Problem: Skin Integrity: Goal: Risk for impaired skin integrity will decrease Outcome: Adequate for Discharge

## 2019-06-06 NOTE — Discharge Instructions (Signed)
Cervical Spine Fracture, Stable  A cervical spine fracture is a break or crack in one of the bones of the neck. If there is a very low risk of problems happening during healing, the fracture is considered stable. What are the causes? This condition may be caused by:  Motor vehicle accidents.  Injuries from sports such as diving, football, biking, wrestling, or skiing.  Severe osteoporosis or other bone diseases, such as cancers that spread to bone or metabolic abnormalities that cause bone weakness. What are the signs or symptoms? Symptoms of this condition include:  Severe neck pain after an accident or fall. Pain may spread down the shoulders or arms.  Bruising or swelling on the back of the neck.  Numbness, tingling, sudden muscle tightening (spasms), or weakness in the arms, legs, or both. How is this diagnosed? This condition may be diagnosed based on:  Your medical history.  A physical exam of your neck, arms, and legs.  Imaging studies of the neck, such as: ? X-rays. ? CT scan. ? MRI. How is this treated? This condition is treated with a neck brace or cervical collar to keep your neck from moving during the healing process. A cervical collar is a device that supports your chin and the back of your head. You may also be given medicine to help relieve pain. Follow these instructions at home: If you have a neck brace:  Wear the brace as told by your health care provider. Remove it only as told by your health care provider.  If you experience numbness or tingling, loosen the brace.  Keep the brace clean.  If the brace is not waterproof: ? Do not let it get wet. ? Cover it with a watertight covering when you take a bath or a shower. If you have a cervical collar:  Do not remove the collar unless your health care provider tells you to do this. If you are allowed to remove the collar for cleaning and bathing: ? Follow your health care provider's instructions about how  to safely take off the collar. ? Wash and thoroughly dry the skin on your neck. Check your skin for irritation or sores. If you see any, tell your health care provider.  Ask your health care provider before making any adjustments to your collar. Small adjustments may be needed over time to improve comfort and reduce pressure on your chin or on the back of your head.  Keep long hair outside of the collar.  Keep your collar clean by wiping it with mild soap and water and letting it air-dry completely. The pads can be hand-washed with soap and water and air-dried completely. Managing pain, stiffness, and swelling   If directed, put ice on the injured area: ? If you have a removable brace or cervical collar, remove it as told by your health care provider. ? Put ice in a plastic bag. ? Place a towel between your skin and the bag. ? Leave the ice on for 20 minutes, 2-3 times a day. Activity  Do not drive a car until your health care provider approves.  Do not drive or use heavy machinery while taking prescription pain medicine.  Avoid physical activity for as long as directed. Ask your health care provider what activities are safe for you. General instructions  Take over-the-counter and prescription medicines only as told by your health care provider.  Do not take baths, swim, or use a hot tub until your health care provider approves. Ask your   health care provider if you can take showers. You may only be allowed to take sponge baths for bathing.  Do not use any products that contain nicotine or tobacco, such as cigarettes and e-cigarettes. These can delay bone healing. If you need help quitting, ask your health care provider.  Keep all follow-up visits as told by your health care provider. This is important to help prevent long-term (chronic) or permanent injury, pain, and disability. You may need to have follow-up X-rays or MRI 1-3 weeks after your injury. Contact a health care provider  if:  You have irritation or sores on your skin from your brace or cervical collar. Get help right away if:  You have neck pain that gets worse.  You develop difficulties swallowing or breathing.  You develop swelling in your neck.  You have any of the following problems in your arms, legs, or both: ? Numbness. ? Weakness. ? Burning pain. ? Movement problems.  You are unable to control when you urinate or have a bowel movement (incontinence).  You have problems with coordination or difficulty walking. This information is not intended to replace advice given to you by your health care provider. Make sure you discuss any questions you have with your health care provider. Document Revised: 12/08/2016 Document Reviewed: 09/30/2015 Elsevier Patient Education  2020 [employer]  

## 2019-06-06 NOTE — Discharge Summary (Signed)
Physician Discharge Summary  Megan Pitts [id number] DOB: May 12, 1927 DOA: 06/03/2019  PCP: Marton Redwood, MD  Admit date: 06/03/2019 Discharge date: 06/06/2019  Admitted From: Home Disposition: SNF  Recommendations for Outpatient Follow-up:  1. Follow up with PCP in 1-2 weeks 2. Follow with orthopedics in 3 weeks 3. Please obtain BMP/CBC in one week 4. Please follow up on the following pending results:  Home Health: None Equipment/Devices: None  Discharge Condition: Stable CODE STATUS: Full code Diet recommendation: Cardiac  Subjective: Seen and examined.  Continues to feel well without any neck pain.  Excited to go to SNF.  Brief/Interim Summary: Megan Pitts a 86 y.o.femalewith medical history significant ofA. fib, aortic stenosis, GERD, Gilbert's syndrome, hyperlipidemia, hypertension, previous vertigo who had a fall about 3 weeks ago. At that time she had a CT of the cervical spine showed an acute fracture of the right inferior articular facet of C4 that extend into the joint. Also cervical stenosis. Patient was seen by Dr. Rolena Infante of [facility] orthopedics at the time. She has an Designer, multimedia in place and conservative management was started. Patient continued to have significant pain and worsening. Her niece that lives with her has noted her decline and not able to do any ADLs. She called Dr. Rolena Infante who referred patient to the ER.  SheDenied any fever or chills.  Due to all of this, she was admitted under hospital service.  Orthopedics was consulted in the ED and per their recommendations, patient underwent repeat CT cervical spine which showed some interval healing of right C4 facet fracture.  Stable appearance of displaced right C5 facet fracture.  Alignment remains a stable.  Orthopedics recommended nonsurgical management and follow-up in 3 weeks.  Patient's pain is improved with current pain medications.  She was evaluated by PT OT and they recommended SNF which has been  arranged for her and she is being discharged today in stable condition.  Her blood pressure initially was elevated however this has improved significantly just with resuming her home medications.   Discharge Diagnoses:  Principal Problem:   Cervical spine fracture (HCC) Active Problems:   Hypertension   Hyperlipidemia   Chronic diastolic CHF (congestive heart failure) ([place])    Discharge Instructions   Allergies as of 06/06/2019      Reactions   Synthroid [levothyroxine Sodium]    Pt stated not sure what happens when she takes this      Medication List    TAKE these medications   acetaminophen 500 MG tablet Commonly known as: TYLENOL Take 500 mg by mouth every 6 (six) hours as needed for mild pain.   amLODipine 10 MG tablet Commonly known as: NORVASC Take 10 mg by mouth daily.   aspirin 81 MG tablet Take 81 mg by mouth daily.   donepezil 10 MG tablet Commonly known as: ARICEPT Take 10 mg by mouth at bedtime.   hydrochlorothiazide 25 MG tablet Commonly known as: HYDRODIURIL Take 25 mg by mouth daily.   ibuprofen 400 MG tablet Commonly known as: ADVIL Take 1 tablet (400 mg total) by mouth every 6 (six) hours as needed for mild pain or moderate pain.   olmesartan 40 MG tablet Commonly known as: BENICAR Take 40 mg by mouth daily.   Vitamin D (Ergocalciferol) 1.25 MG (50000 UNIT) Caps capsule Commonly known as: DRISDOL Take 50,000 Units by mouth every 7 (seven) days. On Sundays       Contact information for follow-up providers    Marton Redwood, MD  Follow up in 1 week(s).   Specialty: Internal Medicine Contact information: [place] [address] [phone]        [place], Peter M, MD .   Specialty: Cardiology Contact information: [address] [place] [address] [place], Dahari, MD Follow up in 3 week(s).   Specialty: Orthopedic Surgery Contact information: [address] [place] [address] [phone]            Contact information for after-discharge care    Destination    [facility] .   Service: Skilled Nursing Contact information: [place] [address] [phone]                 Allergies  Allergen Reactions  . Synthroid [Levothyroxine Sodium]     Pt stated not sure what happens when she takes this    Consultations: Orthopedics   Procedures/Studies: CT Cervical Spine Wo Contrast  Result Date: 06/03/2019 CLINICAL DATA:  Follow-up fracture EXAM: CT CERVICAL SPINE WITHOUT CONTRAST TECHNIQUE: Multidetector CT imaging of the cervical spine was performed without intravenous contrast. Multiplanar CT image reconstructions were also generated. COMPARISON:  May 15 2019 FINDINGS: Alignment: Stable. Skull base and vertebrae: There has been some interval healing of the right C4 inferior articular facet fracture. Displaced fracture through the superior right articular facet of C5 is unchanged. There is stable mild anterolisthesis at C4-C5 and retrolisthesis at C5-C6 and C6-C7. Soft tissues and spinal canal: No prevertebral fluid or swelling. No visible canal hematoma. Disc levels: Multilevel degenerative changes are stable over the short interval. Upper chest: Negative. Other: None. IMPRESSION: Some interval healing of right C4 facet fracture. Stable appearance of displaced right C5 facet fracture. Alignment remains stable. Electronically Signed   By: Macy Mis M.D.   On: 06/03/2019 20:19   CT CERVICAL SPINE WO CONTRAST  Addendum Date: 05/15/2019   ADDENDUM REPORT: 05/15/2019 17:45 ADDENDUM: The original report was by Dr. Van Clines. The following addendum is by Dr. Van Clines: Critical Value/emergent results were called by telephone at the time of interpretation on 05/15/2019 at 5:30 pm to provider Dr. Vickki Hearing , who verbally acknowledged these results. Electronically Signed    By: Van Clines M.D.   On: 05/15/2019 17:45   Result Date: 05/15/2019 CLINICAL DATA:  Fall on 05/12/2019. Pain radiates down the right arm. EXAM: CT CERVICAL SPINE WITHOUT CONTRAST TECHNIQUE: Multidetector CT imaging of the cervical spine was performed without intravenous contrast. Multiplanar CT image reconstructions were also generated. COMPARISON:  CT cervical spine 09/24/2017 FINDINGS: Alignment: 3.5 mm anterolisthesis at C4-5, previously 2.0 mm. Skull base and vertebrae: Acute fracture of the right inferior articular facet of C4 extending into the facet joint. Acute fracture of the right superior articular facet of C5 extending into the facet joint. Both fractures are shown on images 34 through 28 of series 8. I do not observe fractures involving the pedicles or lamina. No left-sided fractures are identified. Degenerative loss of articular space between the anterior arch of C1 and the odontoid with associated spurring. Soft tissues and spinal canal: Bilateral common carotid atherosclerotic calcification. Disc levels: Osseous foraminal stenosis on the right at C4-5, C5-6, and C6-7; and on the left at C3-4, C4-5, C5-6, C6-7, primarily from spurring. Upper chest: Mild biapical pleuroparenchymal scarring. Other: No supplemental non-categorized findings. IMPRESSION: 1. Acute fractures of the right inferior articular facet of C4 extending into the facet joint,  and the right superior articular facet of C5 extending into the facet joint. This is associated with some increased anterolisthesis at C4-5, currently 3.5 mm and previously 2.0 mm. 2. Osseous foraminal stenosis on the right at C4-5, C5-6, and C6-7; and on the left at C3-4, C4-5, C5-6, C6-7; and on the left at C3-4, C4-5, C5-6, C6-7; and on the left at C3-4, C4-5, C5-6, C6-7, C6-7, primarily from spurring. 3. Bilateral common carotid atherosclerotic calcification. Radiology assistant personnel have been notified to put me in telephone contact with the  referring physician or the referring physician's clinical representative in order to discuss these findings. Once this communication is established I will issue an addendum to this report for documentation purposes. Electronically Signed: By: Van Clines M.D. On: 05/15/2019 17:19   CT SHOULDER RIGHT WO CONTRAST  Result Date: 05/15/2019 CLINICAL DATA:  Fall 05/12/2018, pain in the right shoulder/arm EXAM: CT OF THE UPPER RIGHT EXTREMITY WITHOUT CONTRAST TECHNIQUE: Multidetector CT imaging of the upper right extremity was performed according to the standard protocol. COMPARISON:  CT right shoulder from 10/12/2017 FINDINGS: Bones/Joint/Cartilage The greater tuberosity fracture fragment as intervally fused with the humeral head although there continues to be flattening and deformity of the greater tuberosity region especially anteriorly. The inferior glenoid rim fragment has partially fused with the inferior glenoid neck on image 38/6, causing a somewhat spur like appearance. Linear bone fragment posteriorly in the glenohumeral joint, 0.5 cm in long axis on image 36/6. The other tiny fragments previously present are not readily seen. Degenerative AC joint spurring. Ligaments Suboptimally assessed by CT. Muscles and Tendons No regional muscular atrophy. Soft tissues Right apical pleuroparenchymal scarring. Median sternotomy. IMPRESSION: 1. The greater tuberosity fracture fragment as intervally fused with the humeral head. The inferior glenoid rim fragment has partially fused with the inferior glenoid neck, causing a somewhat spur like appearance. 2. Linear bone fragment posteriorly in the glenohumeral joint, 0.5 cm in long axis. 3. Degenerative AC joint spurring. 4. Right apical pleuroparenchymal scarring. Electronically Signed   By: Van Clines M.D.   On: 05/15/2019 17:08      Discharge Exam: Vitals:   06/06/19 0456 06/06/19 0818  BP: (!) 148/48 (!) 133/44  Pulse: 69 80  Resp: 18 17  Temp:  97.8 F (36.6 C) (!) 97.5 F (36.4 C)  SpO2: 95% 96%   Vitals:   06/06/19 0109 06/06/19 0147 06/06/19 0456 06/06/19 0818  BP: (!) 171/54 (!) 143/43 (!) 148/48 (!) 133/44  Pulse:  61 69 80  Resp:   18 17  Temp:   97.8 F (36.6 C) (!) 97.5 F (36.4 C)  TempSrc:   Oral Oral  SpO2:   95% 96%  Weight:      Height:        General: Pt is alert, awake, not in acute distress, Aspen collar in place Cardiovascular: RRR, S1/S2 +, no rubs, no gallops Respiratory: CTA bilaterally, no wheezing, no rhonchi Abdominal: Soft, NT, ND, bowel sounds + Extremities: no edema, no cyanosis    The results of significant diagnostics from this hospitalization (including imaging, microbiology, ancillary and laboratory) are listed below for reference.     Microbiology: Recent Results (from the past 240 hour(s))  SARS CORONAVIRUS 2 (TAT 6-24 HRS) Nasopharyngeal Urine, Clean Catch     Status: None   Collection Time: 06/03/19  3:59 PM   Specimen: Urine, Clean Catch; Nasopharyngeal  Result Value Ref Range Status   SARS Coronavirus 2 NEGATIVE NEGATIVE Final  Comment: (NOTE) SARS-CoV-2 target nucleic acids are NOT DETECTED. The SARS-CoV-2 RNA is generally detectable in upper and lower respiratory specimens during the acute phase of infection. Negative results do not preclude SARS-CoV-2 infection, do not rule out co-infections with other pathogens, and should not be used as the sole basis for treatment or other patient management decisions. Negative results must be combined with clinical observations, patient history, and epidemiological information. The expected result is Negative. Fact Sheet for Patients: SugarRoll.be Fact Sheet for Healthcare Providers: https://www.woods-mathews.com/ This test is not yet approved or cleared by the [place] FDA and  has been authorized for detection and/or diagnosis of SARS-CoV-2 by FDA under an Emergency Use  Authorization (EUA). This EUA will remain  in effect (meaning this test can be used) for the duration of the COVID-19 declaration under Section 56 4(b)(1) of the Act, 21 U.S.C. section 360bbb-3(b)(1), unless the authorization is terminated or revoked sooner. Performed at [facility], [place] [address], [place], [place] [address]   SARS Coronavirus 2 by RT PCR (hospital order, performed in [facility] hospital lab) Nasopharyngeal Nasopharyngeal Swab     Status: None   Collection Time: 06/05/19 10:53 AM   Specimen: Nasopharyngeal Swab  Result Value Ref Range Status   SARS Coronavirus 2 NEGATIVE NEGATIVE Final    Comment: (NOTE) SARS-CoV-2 target nucleic acids are NOT DETECTED. The SARS-CoV-2 RNA is generally detectable in upper and lower respiratory specimens during the acute phase of infection. The lowest concentration of SARS-CoV-2 viral copies this assay can detect is 250 copies / mL. A negative result does not preclude SARS-CoV-2 infection and should not be used as the sole basis for treatment or other patient management decisions.  A negative result may occur with improper specimen collection / handling, submission of specimen other than nasopharyngeal swab, presence of viral mutation(s) within the areas targeted by this assay, and inadequate number of viral copies (<250 copies / mL). A negative result must be combined with clinical observations, patient history, and epidemiological information. Fact Sheet for Patients:   StrictlyIdeas.no Fact Sheet for Healthcare Providers: BankingDealers.co.za This test is not yet approved or cleared  by the [place] FDA and has been authorized for detection and/or diagnosis of SARS-CoV-2 by FDA under an Emergency Use Authorization (EUA).  This EUA will remain in effect (meaning this test can be used) for the duration of the COVID-19 declaration under Section 564(b)(1) of the Act, 21  U.S.C. section 360bbb-3(b)(1), unless the authorization is terminated or revoked sooner. Performed at [facility], [place] [address], [place], [place] [address]      Labs: BNP (last 3 results) No results for input(s): BNP in the last 8760 hours. Basic Metabolic Panel: Recent Labs  Lab 06/03/19 1609 06/04/19 0516 06/05/19 0812  NA 137 135 134*  K 3.9 3.4* 3.7  CL 102 102 102  CO2 21* 23 21*  GLUCOSE 101* 99 175*  BUN 16 11 14   CREATININE 0.82 0.76 1.05*  CALCIUM 9.3 9.2 9.1   Liver Function Tests: Recent Labs  Lab 06/04/19 0516  AST 16  ALT 19  ALKPHOS 59  BILITOT 1.3*  PROT 6.2*  ALBUMIN 3.4*   No results for input(s): LIPASE, AMYLASE in the last 168 hours. No results for input(s): AMMONIA in the last 168 hours. CBC: Recent Labs  Lab 06/03/19 1609 06/04/19 0516  WBC 9.1 8.4  NEUTROABS 5.8  --   HGB 14.5 13.5  HCT 43.6 39.6  MCV 90.8 88.0  PLT 282 288   Cardiac Enzymes: No results for input(s): CKTOTAL, CKMB, CKMBINDEX, TROPONINI in the last 168 hours. BNP: Invalid input(s): POCBNP CBG: No results for input(s): GLUCAP in the last 168 hours. D-Dimer No results for input(s): DDIMER in the last 72 hours. Hgb A1c No results for input(s): HGBA1C in the last 72 hours. Lipid Profile No results for input(s): CHOL, HDL, LDLCALC, TRIG, CHOLHDL, LDLDIRECT in the last 72 hours. Thyroid function studies No results for input(s): TSH, T4TOTAL, T3FREE, THYROIDAB in the last 72 hours.  Invalid input(s): FREET3 Anemia work up No results for input(s): VITAMINB12, FOLATE, FERRITIN, TIBC, IRON, RETICCTPCT in the last 72 hours. Urinalysis    Component Value Date/Time   COLORURINE STRAW (A) 06/03/2019 1530   APPEARANCEUR CLEAR 06/03/2019 1530   LABSPEC 1.008 06/03/2019 1530   PHURINE 6.0 06/03/2019 1530   GLUCOSEU NEGATIVE 06/03/2019 1530   HGBUR NEGATIVE 06/03/2019 1530   BILIRUBINUR NEGATIVE 06/03/2019 1530   KETONESUR NEGATIVE 06/03/2019 1530   PROTEINUR  NEGATIVE 06/03/2019 1530   NITRITE NEGATIVE 06/03/2019 1530   LEUKOCYTESUR NEGATIVE 06/03/2019 1530   Sepsis Labs Invalid input(s): PROCALCITONIN,  WBC,  LACTICIDVEN Microbiology Recent Results (from the past 240 hour(s))  SARS CORONAVIRUS 2 (TAT 6-24 HRS) Nasopharyngeal Urine, Clean Catch     Status: None   Collection Time: 06/03/19  3:59 PM   Specimen: Urine, Clean Catch; Nasopharyngeal  Result Value Ref Range Status   SARS Coronavirus 2 NEGATIVE NEGATIVE Final    Comment: (NOTE) SARS-CoV-2 target nucleic acids are NOT DETECTED. The SARS-CoV-2 RNA is generally detectable in upper and lower respiratory specimens during the acute phase of infection. Negative results do not preclude SARS-CoV-2 infection, do not rule out co-infections with other pathogens, and should not be used as the sole basis for treatment or other patient management decisions. Negative results must be combined with clinical observations, patient history, and epidemiological information. The expected result is Negative. Fact Sheet for Patients: SugarRoll.be Fact Sheet for Healthcare Providers: https://www.woods-mathews.com/ This test is not yet approved or cleared by the [place] FDA and  has been authorized for detection and/or diagnosis of SARS-CoV-2 by FDA under an Emergency Use Authorization (EUA). This EUA will remain  in effect (meaning this test can be used) for the duration of the COVID-19 declaration under Section 56 4(b)(1) of the Act, 21 U.S.C. section 360bbb-3(b)(1), unless the authorization is terminated or revoked sooner. Performed at [facility], [place] [address], [place], [place] [address]   SARS Coronavirus 2 by RT PCR (hospital order, performed in [facility] hospital lab) Nasopharyngeal Nasopharyngeal Swab     Status: None   Collection Time: 06/05/19 10:53 AM   Specimen: Nasopharyngeal Swab  Result Value Ref Range Status   SARS Coronavirus  2 NEGATIVE NEGATIVE Final    Comment: (NOTE) SARS-CoV-2 target nucleic acids are NOT DETECTED. The SARS-CoV-2 RNA is generally detectable in upper and lower respiratory specimens during the acute phase of infection. The lowest concentration of SARS-CoV-2 viral copies this assay can detect is 250 copies / mL. A negative result does not preclude SARS-CoV-2 infection and should not be used as the sole basis for treatment or other patient management decisions.  A negative result may occur with improper specimen collection / handling, submission of specimen other than nasopharyngeal swab, presence of viral mutation(s) within the areas targeted by this assay, and inadequate number of viral copies (<250 copies / mL). A negative result must be combined with clinical observations, patient history,  and epidemiological information. Fact Sheet for Patients:   StrictlyIdeas.no Fact Sheet for Healthcare Providers: BankingDealers.co.za This test is not yet approved or cleared  by the [place] FDA and has been authorized for detection and/or diagnosis of SARS-CoV-2 by FDA under an Emergency Use Authorization (EUA).  This EUA will remain in effect (meaning this test can be used) for the duration of the COVID-19 declaration under Section 564(b)(1) of the Act, 21 U.S.C. section 360bbb-3(b)(1), unless the authorization is terminated or revoked sooner. Performed at [facility], [place] [address] [place], [place] [address]      Time coordinating discharge: Over 30 minutes  SIGNED:   Darliss Cheney, MD  [facility] 06/06/2019, 8:39 AM  If 7PM-7AM, please contact night-coverage [email or web address]

## 2019-06-06 NOTE — Progress Notes (Signed)
Discharge:   Discharge summary and medications reviewed with patient and family.   IV access discontinued.  Pt assisted to private vehicle to be transported to [facility] by family.

## 2019-06-10 DIAGNOSIS — I5032 Chronic diastolic (congestive) heart failure: Secondary | ICD-10-CM | POA: Diagnosis not present

## 2019-06-10 DIAGNOSIS — S129XXA Fracture of neck, unspecified, initial encounter: Secondary | ICD-10-CM | POA: Diagnosis not present

## 2019-06-10 DIAGNOSIS — I350 Nonrheumatic aortic (valve) stenosis: Secondary | ICD-10-CM | POA: Diagnosis not present

## 2019-06-10 DIAGNOSIS — Z8673 Personal history of transient ischemic attack (TIA), and cerebral infarction without residual deficits: Secondary | ICD-10-CM | POA: Diagnosis not present

## 2019-06-10 DIAGNOSIS — E8881 Metabolic syndrome: Secondary | ICD-10-CM | POA: Diagnosis not present

## 2019-06-10 DIAGNOSIS — K649 Unspecified hemorrhoids: Secondary | ICD-10-CM | POA: Diagnosis not present

## 2019-06-10 DIAGNOSIS — E785 Hyperlipidemia, unspecified: Secondary | ICD-10-CM | POA: Diagnosis not present

## 2019-06-10 DIAGNOSIS — I4891 Unspecified atrial fibrillation: Secondary | ICD-10-CM | POA: Diagnosis not present

## 2019-06-10 DIAGNOSIS — R269 Unspecified abnormalities of gait and mobility: Secondary | ICD-10-CM | POA: Diagnosis not present

## 2019-06-10 DIAGNOSIS — I10 Essential (primary) hypertension: Secondary | ICD-10-CM | POA: Diagnosis not present

## 2019-06-13 DIAGNOSIS — M542 Cervicalgia: Secondary | ICD-10-CM | POA: Diagnosis not present

## 2019-06-13 DIAGNOSIS — S12300A Unspecified displaced fracture of fourth cervical vertebra, initial encounter for closed fracture: Secondary | ICD-10-CM | POA: Diagnosis not present

## 2019-06-20 ENCOUNTER — Other Ambulatory Visit: Payer: Self-pay | Admitting: *Deleted

## 2019-06-20 NOTE — Patient Outreach (Signed)
Late entry for 06/19/19  Screened for potential [facility] Care Management needs as a benefit of  [employer].  Mrs. Megan Pitts is receiving skilled therapy at [facility].   Writer attended telephonic interdisciplinary team meeting to assess for disposition needs and transition plan for resident.   Facility reports member lived alone prior. Currently wearing cervical collar. Has daughter that works full time. Will need assist upon returning home. Family care plan meeting scheduled for Tuesday.   Will plan outreach as appropriate to discuss [place] Management services.   Megan Rolling, MSN-Ed, RN,BSN [facility] [place] Acute Care Coordinator [phone] [facility]) [phone]  (Toll free office)

## 2019-06-23 ENCOUNTER — Other Ambulatory Visit: Payer: Self-pay | Admitting: *Deleted

## 2019-06-23 NOTE — Patient Outreach (Signed)
[facility] Post- Acute Care Coordinator follow up  Megan Pitts is receiving skilled therapy at [facility].   Telephone call made to Megan Pitts [phone]. Patient identifiers confirmed. Megan Pitts asks writer to contact her daughter Megan Pitts [phone] to discuss [facility] services.   Telephone call made to Megan Pitts (daughter/DPR) [phone]. Patient identifiers confirmed. Megan Pitts reports they have care plan meeting scheduled tomorrow with facility at 2 pm.   Megan Pitts states the transition plan is to return home. However, Megan Pitts states member will need assist with bathing due to having to wear hard cervical collar. States member has to lay flat in order to change collar. Megan Pitts states she does not feel comfortable in changing collar. Writer inquired whether ALF has been considered. Megan Pitts states member wants to return home. However, Megan Pitts agrees that alternative options need to be considered. Megan Pitts states she and her husband have considered member coming to live with her. However, Megan Pitts states she does not have a full bath downstairs. Also inquired whether paid caregiver assistance has been considered.   Megan Pitts to discuss options and alternatives with facility during care plan meeting tomorrow.   Explained [facility] Management services.  Will send [facility] and writer contact information via email.  Will continue to follow for transition plans. Made Megan Pitts aware that Probation officer will plan outreach again closer to SNF discharge.    Marthenia Rolling, MSN-Ed, RN,BSN [place] Acute Care Coordinator [phone] [facility]) [phone]  (Toll free office)

## 2019-06-26 ENCOUNTER — Other Ambulatory Visit: Payer: Self-pay | Admitting: *Deleted

## 2019-06-26 DIAGNOSIS — I10 Essential (primary) hypertension: Secondary | ICD-10-CM

## 2019-06-26 NOTE — Patient Outreach (Signed)
[facility] Post- Acute Care Coordinator follow up  Telephone call received from [facility] (daughter/ [place]) [phone]. Patient identifiers confirmed.   Megan Pitts reports member is supposed to transition home on Sunday. However, she states she may bring her home on Friday instead. Megan Pitts reports she is going on vacation on Sunday. States she will have neighbors look in after Megan Pitts. However, Megan Pitts will not have 24/7 assistance. States member has a medical alert system and will have home health.   Megan Pitts requests referrals for [facility] Care Management RNCM and [facility] LCSW for transportation and meals. Megan Pitts also states she wants Probation officer to make referral to [facility]. All of these services were previously discussed with Megan Pitts on 06/23/19.   Will make referrals for [facility] RNCM, [facility] LCSW and [facility].   Megan Pitts has history of falls, cervical fracture with conservative management. Wears cervical collar. Also history of CHF, HTN, HLD, afib, aortic stenosis, GERD, Gilbert's syndrome.   Megan Rolling, MSN-Ed, RN,BSN [place] Acute Care Coordinator [phone] [facility]) [phone]  (Toll free office)

## 2019-06-27 ENCOUNTER — Other Ambulatory Visit: Payer: Self-pay | Admitting: *Deleted

## 2019-06-27 NOTE — Patient Outreach (Signed)
[facility]) Care Management  06/27/2019  Megan Pitts October 23, 1927 [id number]  Late Entry:  CSW was able to make initial contact with patient's daughter, [name] today to perform the phone assessment on patient, as well as assess and assist with social work needs and services.  CSW introduced self, explained role and types of services provided through [place] [facility] ([place] [facility]).  CSW further explained to [name] that [place] works with patient's Post Acute Care Coordinator, also with [place] [facility], Marthenia Rolling.  CSW then explained the reason for the call, indicating that Ms. Nevada Crane thought that patient would benefit from social work services and resources to assist with transportation to and from physician appointments, as well as assistance with meal preparation, just until patient's cervical collar is removed and patient is able to perform all activities of daily living independently again.  CSW obtained two HIPAA compliant identifiers from [name], which included patient's name and date of birth. Back on the 28th of June, start meals on June 29th. Mom's Meals 28 days, soft diet  Hold off on referral to [employer]. Cannot open mouth all the way open, due to cervical collar. Patient sold her car and does not currently have transportation to get to and from her physician appointments.   Transportation Services  Dr. Peter Martinique, Cardiologist July 08, 2019, at 1:20pm, but patient needs to arrive at 1:05pm. Daughter is scheduling a follow-up appointment for patient with her Primary Care Physician,    Follow-up on July 09, 2019

## 2019-06-28 DIAGNOSIS — Z952 Presence of prosthetic heart valve: Secondary | ICD-10-CM | POA: Diagnosis not present

## 2019-06-28 DIAGNOSIS — W19XXXD Unspecified fall, subsequent encounter: Secondary | ICD-10-CM | POA: Diagnosis not present

## 2019-06-28 DIAGNOSIS — M8580 Other specified disorders of bone density and structure, unspecified site: Secondary | ICD-10-CM | POA: Diagnosis not present

## 2019-06-28 DIAGNOSIS — I110 Hypertensive heart disease with heart failure: Secondary | ICD-10-CM | POA: Diagnosis not present

## 2019-06-28 DIAGNOSIS — G3184 Mild cognitive impairment, so stated: Secondary | ICD-10-CM | POA: Diagnosis not present

## 2019-06-28 DIAGNOSIS — E8881 Metabolic syndrome: Secondary | ICD-10-CM | POA: Diagnosis not present

## 2019-06-28 DIAGNOSIS — Z9181 History of falling: Secondary | ICD-10-CM | POA: Diagnosis not present

## 2019-06-28 DIAGNOSIS — R1312 Dysphagia, oropharyngeal phase: Secondary | ICD-10-CM | POA: Diagnosis not present

## 2019-06-28 DIAGNOSIS — Z8673 Personal history of transient ischemic attack (TIA), and cerebral infarction without residual deficits: Secondary | ICD-10-CM | POA: Diagnosis not present

## 2019-06-28 DIAGNOSIS — S12400D Unspecified displaced fracture of fifth cervical vertebra, subsequent encounter for fracture with routine healing: Secondary | ICD-10-CM | POA: Diagnosis not present

## 2019-06-28 DIAGNOSIS — I4891 Unspecified atrial fibrillation: Secondary | ICD-10-CM | POA: Diagnosis not present

## 2019-06-28 DIAGNOSIS — Z7982 Long term (current) use of aspirin: Secondary | ICD-10-CM | POA: Diagnosis not present

## 2019-06-28 DIAGNOSIS — I5032 Chronic diastolic (congestive) heart failure: Secondary | ICD-10-CM | POA: Diagnosis not present

## 2019-06-28 DIAGNOSIS — S12300D Unspecified displaced fracture of fourth cervical vertebra, subsequent encounter for fracture with routine healing: Secondary | ICD-10-CM | POA: Diagnosis not present

## 2019-06-28 DIAGNOSIS — I350 Nonrheumatic aortic (valve) stenosis: Secondary | ICD-10-CM | POA: Diagnosis not present

## 2019-06-30 ENCOUNTER — Other Ambulatory Visit: Payer: Self-pay

## 2019-06-30 ENCOUNTER — Encounter: Payer: Self-pay | Admitting: *Deleted

## 2019-06-30 NOTE — Patient Outreach (Signed)
[facility]) Care Management  06/30/2019  Megan Pitts Mar 22, 1927 [id number]   Transition of care: New referral. Discharged home from [place] on 06/27/2019.    Placed call to contact person- daughter. No answer. Left a message requesting a call back. Reviewed [facility] social worker note.  PLAN: Will await a return call from daughter. If no call back, will attempt to call again in 3 days.  Will mail an unsuccessful outreach letter.  Tomasa Rand, RN, BSN, CEN [facility] [employer] [phone]

## 2019-06-30 NOTE — Patient Outreach (Addendum)
[place] Megan Pitts) Care Management  06/27/2019  TALISHIA BETZLER May 04, 1927 [id number]  Late Entry:  CSW was able to make initial contact with patient's daughter, [name] today to perform the phone assessment on patient, as well as assess and assist with social work needs and services.  CSW introduced self, explained role and types of services provided through [place] [facility] ([place] [facility]).  CSW further explained to [name] that [place] works with patient's Post Acute Care Coordinator, also with [place] [facility], Marthenia Rolling.  CSW then explained the reason for the call, indicating that Ms. Nevada Crane thought that patient would benefit from social work services and resources to assist with transportation to and from physician appointments, as well as assistance with meal preparation, just until patient's cervical collar is removed and patient is able to perform all activities of daily living independently again.  CSW obtained two HIPAA compliant identifiers from [name], which included patient's name and date of birth.  [name] reported that she was able to arrange for a family member to stay with patient while she is out-of-town, returning on Monday, July 07, 2019.  During that time, [name] indicated that all of patient's meals will be prepared for her, medications administered, supervision provided and assistance with activities of daily living performed.  [name] admitted that it is difficult for patient to prepare her own meals at present, due to having to wear a cervical collar, after falling at home and sustaining a cervical spine fracture.  The cervical collar is obstructing patient's view, making it impossible to perform certain tasks.  CSW spoke with [name] about CSW arranging meal delivery services for patient through [employer], courtesy of [place] [employer].  CSW agreed to approve patient for a 28 day  supply of meals, delivered in 2 week increments, tailor-made to meet patient's food allergies, dietary restrictions, food preferences, etc.  [name] requested that the meal delivery services begin on Tuesday, July 08, 2019, admitting that patient has no food allergies or dietary restrictions, with the exception of needing to remain on a soft diet, at least until the cervical collar is removed, as it is difficult for patient to open her mouth wide or chew for long periods of time.  CSW also offered to place a referral for patient to [employer], through [employer] [facility] [place]; however, [name] declined, reporting that patient will not agreed to pay for services.  CSW inquired about patient's need for in-home care services to assist with activities of daily living, light housekeeping duties, meal preparation, transportation services, medication administration, etc.  But again, [name] denied the need for services, or even to receiving a list of in-home care agencies for future reference, admitting that she will continue to be patient's primary caregiver and provide all needed services to patient.  [name] further reported that patient is in need of transportation assistance to and from physician appointments, for which CSW agreed to arrange [employer], free of charge, courtesy of [facility].  [name] indicated that patient recently sold her car, no longer able to drive herself.  CSW noted that patient does not have a follow-up appointment scheduled with her Primary Care Physician, Dr. Marton Redwood, which needed to be scheduled within 2 weeks of patient being discharged from [facility].  [place] is a [place] where patient was residing,  upon discharge from the hospital, to receive short-term rehabilitative services.  CSW encouraged [name] to go ahead and schedule  this appointment, then contact CSW to provide the specifics of the appointment, so that CSW can arrange for transportation services.  [name] voiced understanding and was agreeable.  CSW agreed to arrange transportation services for patient to her follow-up appointment with Dr. Peter Martinique, Cardiologist with [facility], scheduled for Tuesday, July 08, 2019, at 1:20pm, noting that patient needs to arrive to this appointment at 1:05pm.  [name] was most appreciative of the call and of all services provided to patient thus far.  [name] requested that CSW contact her again on Wednesday, July 09, 2019, around 10:00am, to follow-up on patient's status.  CSW was able to confirm that [name] has the correct contact information for CSW, encouraging her to contact CSW directly, if additional social work needs arise in the meantime.  CSW will route this note to Dr. Brigitte Pulse, as well as patient's RNCM, also with [place] [facility], Tomasa Rand, to inform them of CSW's plan of care for patient.  Patient is receiving home health services, and a referral has been made to [facility], also with [place], [place], MSW, Veteran  Licensed Clinical Social Worker  [place]  Mailing [place]. [address] [place], [place] [address] Physical Address-[address], [place], [place] [address] Toll Free Main # [phone] Fax # [phone] Cell # [phone]  Office # [phone] [name]

## 2019-06-30 NOTE — Patient Outreach (Addendum)
[facility]) Care Management  06/27/2019  AMAIRANY SCHUMPERT [birth year] [id number]  Late Entry:  CSW was able to make initial contact with patient's daughter, [name] today to perform the phone assessment on patient, as well as assess and assist with social work needs and services.  CSW introduced self, explained role and types of services provided through [place] [facility] ([place] [facility]).  CSW further explained to [name] that [place] works with patient's Post Acute Care Coordinator, also with [place] [facility], Marthenia Rolling.  CSW then explained the reason for the call, indicating that Ms. Nevada Crane thought that patient would benefit from social work services and resources to assist with transportation to and from physician appointments, as well as assistance with meal preparation, just until patient's cervical collar is removed and patient is able to perform all activities of daily living independently again.  CSW obtained two HIPAA compliant identifiers from [name], which included patient's name and date of birth.  [name] reported that she was able to arrange for a family member to stay with patient while she is out-of-town, returning on Monday, July 07, 2019.  During that time, [name] indicated that all of patient's meals will be prepared for her, medications administered, supervision provided and assistance with activities of daily living performed.  [name] admitted that it is difficult for patient to prepare her own meals at present, due to having to wear a cervical collar, after falling at home and sustaining a cervical spine fracture.  The cervical collar is obstructing patient's view, making it impossible to perform certain tasks.  CSW spoke with [name] about CSW arranging meal delivery services for patient through [employer], courtesy of [place] [facility].  CSW agreed to approve patient for a 28 day  supply of meals, delivered in 2 week increments, tailor-made to meet patient's food allergies, dietary restrictions, food preferences, etc.  [name] requested that the meal delivery services begin on Tuesday, July 08, 2019, admitting that patient has no food allergies or dietary restrictions, with the exception of needing to remain on a soft diet, at least until the cervical collar is removed, as it is difficult for patient to open her mouth wide or chew for long periods of time.  CSW also offered to place a referral for patient to [employer], through [employer] [facility] [place]; however, [name] declined, reporting that patient will not agreed to pay for services.  CSW inquired about patient's need for in-home care services to assist with activities of daily living, light housekeeping duties, meal preparation, transportation services, medication administration, etc.  But again, [name] denied the need for services, or even to receiving a list of in-home care agencies for future reference, admitting that she will continue to be patient's primary caregiver and provide all needed services to patient.  [name] further reported that patient is in need of transportation assistance to and from physician appointments, for which CSW agreed to arrange [employer], free of charge, courtesy of [facility].  [name] indicated that patient recently sold her car, no longer able to drive herself.  CSW noted that patient does not have a follow-up appointment scheduled with her Primary Care Physician, Dr. Marton Redwood, which needed to be scheduled within 2 weeks of patient being discharged from [facility].  [place] is a [place] where patient was residing,  upon discharge from the hospital, to receive short-term rehabilitative services.  CSW encouraged [name] to go ahead and schedule  this appointment, then contact CSW to provide the specifics of the appointment, so that CSW can arrange for transportation services.  [name] voiced understanding and was agreeable.  CSW agreed to arrange transportation services for patient to her follow-up appointment with Dr. Peter Martinique, Cardiologist with [facility], scheduled for Tuesday, July 08, 2019, at 1:20pm, noting that patient needs to arrive to this appointment at 1:05pm.  [name] was most appreciative of the call and of all services provided to patient thus far.  [name] requested that CSW contact her again on Wednesday, July 09, 2019, around 10:00am, to follow-up on patient's status.  CSW was able to confirm that [name] has the correct contact information for CSW, encouraging her to contact CSW directly, if additional social work needs arise in the meantime.  CSW will route this note to Dr. Brigitte Pulse, as well as patient's RNCM, also with [place] [facility], Tomasa Rand, to inform them of CSW's plan of care for patient.  Nat Christen, BSW, MSW, LCSW  Licensed Education officer, environmental Health System  Mailing [place] [address]. [address], [place], [place] [address] Physical Address-[address], [place], [place] [address] Toll Free Main # [phone] Fax # [phone] Cell # [phone]  Office # [phone] [name]

## 2019-07-01 DIAGNOSIS — S12400D Unspecified displaced fracture of fifth cervical vertebra, subsequent encounter for fracture with routine healing: Secondary | ICD-10-CM | POA: Diagnosis not present

## 2019-07-01 DIAGNOSIS — W19XXXD Unspecified fall, subsequent encounter: Secondary | ICD-10-CM | POA: Diagnosis not present

## 2019-07-01 DIAGNOSIS — I110 Hypertensive heart disease with heart failure: Secondary | ICD-10-CM | POA: Diagnosis not present

## 2019-07-01 DIAGNOSIS — S12300D Unspecified displaced fracture of fourth cervical vertebra, subsequent encounter for fracture with routine healing: Secondary | ICD-10-CM | POA: Diagnosis not present

## 2019-07-01 DIAGNOSIS — G3184 Mild cognitive impairment, so stated: Secondary | ICD-10-CM | POA: Diagnosis not present

## 2019-07-01 DIAGNOSIS — I5032 Chronic diastolic (congestive) heart failure: Secondary | ICD-10-CM | POA: Diagnosis not present

## 2019-07-03 ENCOUNTER — Other Ambulatory Visit: Payer: Self-pay

## 2019-07-03 DIAGNOSIS — W19XXXD Unspecified fall, subsequent encounter: Secondary | ICD-10-CM | POA: Diagnosis not present

## 2019-07-03 DIAGNOSIS — S12300D Unspecified displaced fracture of fourth cervical vertebra, subsequent encounter for fracture with routine healing: Secondary | ICD-10-CM | POA: Diagnosis not present

## 2019-07-03 DIAGNOSIS — I5032 Chronic diastolic (congestive) heart failure: Secondary | ICD-10-CM | POA: Diagnosis not present

## 2019-07-03 DIAGNOSIS — I110 Hypertensive heart disease with heart failure: Secondary | ICD-10-CM | POA: Diagnosis not present

## 2019-07-03 DIAGNOSIS — G3184 Mild cognitive impairment, so stated: Secondary | ICD-10-CM | POA: Diagnosis not present

## 2019-07-03 DIAGNOSIS — S12400D Unspecified displaced fracture of fifth cervical vertebra, subsequent encounter for fracture with routine healing: Secondary | ICD-10-CM | POA: Diagnosis not present

## 2019-07-03 NOTE — Patient Outreach (Signed)
[facility]) Care Management  07/03/2019  Megan Pitts 09/11/1927 [id number]   Telephone Assessment:   2nd outreach call to patient, successful.  Placed call to patient who answered. Reviewed reason for call. Patient reports to me that she is doing well. Denies any neck pain. Reports OT is coming today and she has follow up appointment with primary MD today.  Reports her niece is staying with her at this time while daughter is out of town. Patient reports she will need someone to stay with her next week.  Reviewed interest in transition of care program. Patient reports she is not sure--please all my daughter. Placed call to [name], daughter, no answer. Left a message.  PLAN: will attempt call back in 3 days. Will send in basket message to active [facility] social worker about help in the home. Will wait to see if patient and daughter are interested in nursing services.  Tomasa Rand, RN, BSN, CEN [facility] [employer] [phone]

## 2019-07-04 DIAGNOSIS — I10 Essential (primary) hypertension: Secondary | ICD-10-CM | POA: Diagnosis not present

## 2019-07-04 NOTE — Progress Notes (Signed)
Megan Pitts Date of Birth: 12-21-1927 Medical Record #[id number]  History of Present Illness: Megan Pitts is seen back today for a follow up Aortic valve disease. She has a history of severe AS and had AVR with a #51mm [place] stentless porcine valve in July of 2007. Echo in 1/18 and 6/20 showed good valve function with only trivial AI. Other issues include HTN, PVCs, HLD, post op atrial fib, GERD, Gilbert's syndrome, and carotid disease. She has a history of bradycardia when taking beta blockers.   She did suffer a fall in May with resultant nondisplaced fracture of C4-5 managed conservatively. Followed by Dr Rolena Infante. Still in a neck brace and walking with a walker. Prior to her fall she was quite active. She denies any chest pain, SOB, palpitations, dizziness.     Current Outpatient Medications  Medication Sig Dispense Refill  . acetaminophen (TYLENOL) 500 MG tablet Take 500 mg by mouth every 6 (six) hours as needed for mild pain.    Marland Kitchen amLODipine (NORVASC) 10 MG tablet Take 10 mg by mouth daily.    Marland Kitchen aspirin 81 MG tablet Take 81 mg by mouth daily.      Marland Kitchen donepezil (ARICEPT) 10 MG tablet Take 10 mg by mouth at bedtime.   10  . hydrochlorothiazide (HYDRODIURIL) 25 MG tablet Take 25 mg by mouth daily.    Marland Kitchen ibuprofen (ADVIL,MOTRIN) 400 MG tablet Take 1 tablet (400 mg total) by mouth every 6 (six) hours as needed for mild pain or moderate pain. 30 tablet 0  . methocarbamol (ROBAXIN) 500 MG tablet Take 1 tablet by mouth as needed. For spasms/muscle tension    . olmesartan (BENICAR) 40 MG tablet Take 40 mg by mouth daily.    . Vitamin D, Ergocalciferol, (DRISDOL) 50000 units CAPS capsule Take 50,000 Units by mouth every 7 (seven) days. On Sundays     No current facility-administered medications for this visit.    Allergies  Allergen Reactions  . Synthroid [Levothyroxine Sodium]     Pt stated not sure what happens when she takes this    Past Medical History:  Diagnosis Date  . A-fib ([place])    . Aortic stenosis    s/p AVR 07/2005(porcine)  . Carotid bruit 09/2008   <50% B  . GERD (gastroesophageal reflux disease)   . Gilbert's syndrome   . Hyperlipidemia   . Hypertension   . IFG (impaired fasting glucose)   . OAB (overactive bladder)   . Osteopenia   . Phlebitis    UPPER EXTREMITY  . PVC's (premature ventricular contractions)   . Shingles   . Varicose veins    s/p treatment   . Vertigo     Past Surgical History:  Procedure Laterality Date  . ABDOMINAL HYSTERECTOMY  1963   partial  . AORTIC VALVE REPLACEMENT  08/01/2005   WITH A #23MM [place] STENTLESS PORCINE AORTIC VALVE  . CARDIAC CATHETERIZATION  07/25/2005   EF 60% THE AORTIC VALVE IS HEAVILY CALCIFIED WITH REDUCED OPENING. NO MVP  . FOOT SURGERY    . HAND SURGERY    . HEMORRHOID SURGERY  1980's  . US ECHOCARDIOGRAPHY  04/14/2008   EF 55-60%. NORMAL  . US ECHOCARDIOGRAPHY  11/21/2005   EF 55-60%  . US ECHOCARDIOGRAPHY  07/20/2005   EF 55-60%  . US ECHOCARDIOGRAPHY  10/19/2004   EF 55-60%  . VARICOSE VEIN SURGERY  2010   laser treatment    Social History   Tobacco Use  Smoking Status Never  Smoker  Smokeless Tobacco Never Used    Social History   Substance and Sexual Activity  Alcohol Use No    Family History  Problem Relation Age of Onset  . Pneumonia Mother 29  . Alzheimer's disease Father   . Leukemia Brother   . Alcohol abuse Brother     Review of Systems: The review of systems is per the HPI.  All other systems were reviewed and are negative.  Physical Exam: BP 130/62   Pulse 82   Ht 5' 3.5" (1.613 m)   Wt 156 lb 12.8 oz (71.1 kg)   SpO2 97%   BMI 27.34 kg/m  GENERAL:  Well appearing WF appears younger than stated age.  HEENT:  PERRL, EOMI, sclera are clear. Oropharynx is clear. NECK:  No jugular venous distention, carotid upstroke brisk and symmetric, no bruits, no thyromegaly or adenopathy LUNGS:  Clear to auscultation bilaterally CHEST:  Unremarkable HEART:  RRR,  PMI  not displaced or sustained,S1 and S2 within normal limits, no S3, no S4: no clicks, no rubs, There is a gr 8-2/5 systolic murmur RUSB radiating to carotids.  ABD:  Soft, nontender. BS +, no masses or bruits. No hepatomegaly, no splenomegaly EXT:  2 + pulses throughout, no edema, no cyanosis no clubbing SKIN:  Warm and dry.  No rashes NEURO:  Alert and oriented x 3. Cranial nerves II through XII intact. PSYCH:  Cognitively intact    LABORATORY DATA: Lab Results  Component Value Date   WBC 8.4 06/04/2019   HGB 13.5 06/04/2019   HCT 39.6 06/04/2019   PLT 288 06/04/2019   GLUCOSE 175 (H) 06/05/2019   ALT 19 06/04/2019   AST 16 06/04/2019   NA 134 (L) 06/05/2019   K 3.7 06/05/2019   CL 102 06/05/2019   CREATININE 1.05 (H) 06/05/2019   BUN 14 06/05/2019   CO2 21 (L) 06/05/2019   INR 0.98 09/24/2017   Labs dated 05/11/16: cholesterol 181, triglycerides 84, HDL 68, LDL 96. Dated 05/04/16: normal chemistries, Hgb, and TSH. Dated 05/23/18: cholesterol 294, triglycerides 123, HDL 71, LDL 198. A1c 5.4%.   Ecg today. NSR with rate 69 bpm. Otherwise normal. I have personally reviewed and interpreted this study.   Echo 02/03/16: Study Conclusions  - Left ventricle: The cavity size was normal. Wall thickness was   increased in a pattern of mild LVH. Doppler parameters are   consistent with abnormal left ventricular relaxation (grade 1   diastolic dysfunction). - Aortic valve: AV opens well Peak and mean gradients through the   valve are 23 and 13 mm Hg respectively. There was trivial   regurgitation. - Left atrium: The atrium was mildly dilated.  Echo 06/20/18: IMPRESSIONS    1. The left ventricle has hyperdynamic systolic function, with an  ejection fraction of >65%. The cavity size was normal. Left ventricular  diastolic Doppler parameters are consistent with impaired relaxation.  2. The right ventricle has normal systolic function. The cavity was  normal. There is no increase in  right ventricular wall thickness.  3. Aortic valve regurgitation is trivial by color flow Doppler. No  stenosis of the aortic valve.  4. The aortic root and ascending aorta are normal in size and structure.  5. The interatrial septum was not assessed.   Assessment / Plan:  1. S/P AVR with tissue prosthesis in 2007- Stable Echo in 2018 and in 2020. Asymptomatic.   2. HTN - controlled. Continue current therapy. Avoid beta blocker.   3.  Hyperlipidemia  4. Cervical fracture in neck brace.

## 2019-07-07 ENCOUNTER — Other Ambulatory Visit: Payer: Self-pay

## 2019-07-07 ENCOUNTER — Other Ambulatory Visit: Payer: Self-pay | Admitting: *Deleted

## 2019-07-07 DIAGNOSIS — S12400D Unspecified displaced fracture of fifth cervical vertebra, subsequent encounter for fracture with routine healing: Secondary | ICD-10-CM | POA: Diagnosis not present

## 2019-07-07 DIAGNOSIS — W19XXXD Unspecified fall, subsequent encounter: Secondary | ICD-10-CM | POA: Diagnosis not present

## 2019-07-07 DIAGNOSIS — I110 Hypertensive heart disease with heart failure: Secondary | ICD-10-CM | POA: Diagnosis not present

## 2019-07-07 DIAGNOSIS — I5032 Chronic diastolic (congestive) heart failure: Secondary | ICD-10-CM | POA: Diagnosis not present

## 2019-07-07 DIAGNOSIS — G3184 Mild cognitive impairment, so stated: Secondary | ICD-10-CM | POA: Diagnosis not present

## 2019-07-07 DIAGNOSIS — S12300D Unspecified displaced fracture of fourth cervical vertebra, subsequent encounter for fracture with routine healing: Secondary | ICD-10-CM | POA: Diagnosis not present

## 2019-07-07 NOTE — Patient Outreach (Signed)
[facility]) Care Management  07/07/2019  MYLEEN BRAILSFORD Mar 24, 1927 [id number]   CSW received an incoming call from patient's daughter, [name] indicating that she needs transportation arranged for patient to her upcoming appointment with her Primary Care Physician, Dr. Marton Redwood.  The appointment is scheduled for Thursday, July 24, 2019 at 4:00pm.  CSW made transportation arrangements through [employer], courtesy of Scientist, clinical (histocompatibility and immunogenetics).  Patient and [name] are aware that the driver will pick patient up for her appointment at 3:30pm and will stay with patient for the duration of her appointment, then transport her back home directly afterwards.  Patient will be using a walker to assist with ambulation.    CSW has also arranged for patient to receive transportation to her appointment tomorrow, Tuesday, July 08, 2019, at 1:20pm, with Dr. Peter Martinique, Cardiologist with [facility], noting that patient needs to arrive to this appointment at 1:05pm.  CSW has requested that Transportation Services pick patient up at 12:30pm, also remaining in the physician office with patient, in order to transport patient back home directly after her appointment.  Ms. [name] indicated that patient's neighbor will transport patient to have labs drawn this Thursday, July 10, 2019, at the time of her convenience, and that she will be transporting patient to her appointment with Dr. Melina Schools, Orthopedic Surgeon at [facility], scheduled for next week.  [name] is aware that CSW has arranged for [employer], courtesy of [place] [facility], to deliver patient's first two weeks worth of meals tomorrow, Tuesday, July 08, 2019, and that the food will be delivered directly to patient's front door, inside of a large cooler, to keep things cool.  Near the end of that two week period, patient will receive another  shipment of meals, 14 in total, then decide if she would like to continue to receive meal services, but at her own expense.  CSW will follow-up with [name] on Wednesday, July 09, 2019, around 10:00am, to ensure that patient's meals have been delivered and that transportation services went smoothly for patient's appointment tomorrow with Dr. Martinique.  Nat Christen, BSW, MSW, LCSW  Licensed Education officer, environmental Health System  Mailing [place] [address]. [address], [place], [place] [address] Physical Address-[address], [place], [place] [address] Toll Free Main # [phone] Fax # [phone] Cell # [phone]  Office # [phone] [name]

## 2019-07-07 NOTE — Patient Outreach (Signed)
[facility]) Care Management  07/07/2019  JUMANAH HYNSON 09/02/1927 [id number]   Late entry:  Daughter returned call on 07/03/2019.  Reports she feels like her mom is doing well. Reports she is on vacations. I reviewed with daughter patients concern for needing assistance at home. Daughter reports she is aware and is working on it.  Daughter denies any nursing needs at this time and will follow up with Education officer, museum.  PLAN: close case to nursing. Will send in basket message to social worker.  Tomasa Rand, RN, BSN, CEN [facility] [employer] [phone]

## 2019-07-08 ENCOUNTER — Ambulatory Visit (INDEPENDENT_AMBULATORY_CARE_PROVIDER_SITE_OTHER): Payer: Medicare Other | Admitting: Cardiology

## 2019-07-08 ENCOUNTER — Encounter: Payer: Self-pay | Admitting: Cardiology

## 2019-07-08 ENCOUNTER — Other Ambulatory Visit: Payer: Self-pay

## 2019-07-08 VITALS — BP 130/62 | HR 82 | Ht 63.5 in | Wt 156.8 lb

## 2019-07-08 DIAGNOSIS — I10 Essential (primary) hypertension: Secondary | ICD-10-CM

## 2019-07-08 DIAGNOSIS — Z952 Presence of prosthetic heart valve: Secondary | ICD-10-CM | POA: Diagnosis not present

## 2019-07-09 ENCOUNTER — Other Ambulatory Visit: Payer: Self-pay | Admitting: *Deleted

## 2019-07-09 ENCOUNTER — Encounter: Payer: Self-pay | Admitting: *Deleted

## 2019-07-09 DIAGNOSIS — I110 Hypertensive heart disease with heart failure: Secondary | ICD-10-CM | POA: Diagnosis not present

## 2019-07-09 DIAGNOSIS — G3184 Mild cognitive impairment, so stated: Secondary | ICD-10-CM | POA: Diagnosis not present

## 2019-07-09 DIAGNOSIS — S12300D Unspecified displaced fracture of fourth cervical vertebra, subsequent encounter for fracture with routine healing: Secondary | ICD-10-CM | POA: Diagnosis not present

## 2019-07-09 DIAGNOSIS — W19XXXD Unspecified fall, subsequent encounter: Secondary | ICD-10-CM | POA: Diagnosis not present

## 2019-07-09 DIAGNOSIS — S12400D Unspecified displaced fracture of fifth cervical vertebra, subsequent encounter for fracture with routine healing: Secondary | ICD-10-CM | POA: Diagnosis not present

## 2019-07-09 DIAGNOSIS — I5032 Chronic diastolic (congestive) heart failure: Secondary | ICD-10-CM | POA: Diagnosis not present

## 2019-07-09 NOTE — Patient Outreach (Signed)
[facility]) Care Management  07/09/2019  Megan Pitts 01-26-1927 [id number]   CSW was able to make contact with patient's daughter, [name] today to follow-up regarding social work services and resources for patient.  CSW was first able to confirm that patient's first two weeks worth of meals were delivered to patient's home yesterday, Tuesday, July 08, 2019, through [employer], courtesy of [employer].  [name] confirmed that the meals were delivered and that patient enjoyed her first meal last evening.  [name] and patient are aware that patient's second set of meals (14 day supply) will be delivered directly to patient's home within the next two weeks, close to the time that patient is due to run out of her first two weeks worth of meals.    CSW was also able to confirm that patient's transport, through [employer], again courtesy of [employer], went smoothly yesterday (Tuesday, July 08, 2019) and that patient had no complaints.  [name] stated, "Everything went off without a hitch".  CSW reminded [name] that transportation arrangements, also through [employer], has been arranged for patient to her upcoming appointment with her Primary Care Physician, Dr. Marton Redwood, scheduled for Thursday, July 24, 2019, at 4:00pm.  [name] validated this appointment, admitting that these services are most helpful to her and patient, as [name] tries to continue to work full-time.  CSW encouraged [name] to contact CSW directly in the future, if she and patient require additional assistance with transportation services, as CSW has agreed to arrange for transport through [employer].  CSW was able to confirm that [name] has the correct contact information for CSW.  CSW will perform a case closure on patient, as all goals of treatment have been met from social work  standpoint and no additional social work needs have been identified at this time.  CSW will fax an update to patient's Primary Care Physician, Dr. Marton Redwood to ensure that he is aware of CSW's involvement with patient's plan of care, as well as submit a Physician Case Closure Letter.  Nat Christen, BSW, MSW, LCSW  Licensed Education officer, [facility] [employer]  Mailing [place] [address] [place], [place] [address] Physical Address-[address], [place], [place] [address] Toll Free Main # [phone] Fax # [phone] Cell # [phone]  Office # [phone] [name]

## 2019-07-10 DIAGNOSIS — S12400D Unspecified displaced fracture of fifth cervical vertebra, subsequent encounter for fracture with routine healing: Secondary | ICD-10-CM | POA: Diagnosis not present

## 2019-07-10 DIAGNOSIS — I5032 Chronic diastolic (congestive) heart failure: Secondary | ICD-10-CM | POA: Diagnosis not present

## 2019-07-10 DIAGNOSIS — I110 Hypertensive heart disease with heart failure: Secondary | ICD-10-CM | POA: Diagnosis not present

## 2019-07-10 DIAGNOSIS — W19XXXD Unspecified fall, subsequent encounter: Secondary | ICD-10-CM | POA: Diagnosis not present

## 2019-07-10 DIAGNOSIS — G3184 Mild cognitive impairment, so stated: Secondary | ICD-10-CM | POA: Diagnosis not present

## 2019-07-10 DIAGNOSIS — S12300D Unspecified displaced fracture of fourth cervical vertebra, subsequent encounter for fracture with routine healing: Secondary | ICD-10-CM | POA: Diagnosis not present

## 2019-07-15 DIAGNOSIS — W19XXXD Unspecified fall, subsequent encounter: Secondary | ICD-10-CM | POA: Diagnosis not present

## 2019-07-15 DIAGNOSIS — I5032 Chronic diastolic (congestive) heart failure: Secondary | ICD-10-CM | POA: Diagnosis not present

## 2019-07-15 DIAGNOSIS — I110 Hypertensive heart disease with heart failure: Secondary | ICD-10-CM | POA: Diagnosis not present

## 2019-07-15 DIAGNOSIS — G3184 Mild cognitive impairment, so stated: Secondary | ICD-10-CM | POA: Diagnosis not present

## 2019-07-15 DIAGNOSIS — S12400D Unspecified displaced fracture of fifth cervical vertebra, subsequent encounter for fracture with routine healing: Secondary | ICD-10-CM | POA: Diagnosis not present

## 2019-07-15 DIAGNOSIS — S12300D Unspecified displaced fracture of fourth cervical vertebra, subsequent encounter for fracture with routine healing: Secondary | ICD-10-CM | POA: Diagnosis not present

## 2019-07-16 DIAGNOSIS — I110 Hypertensive heart disease with heart failure: Secondary | ICD-10-CM | POA: Diagnosis not present

## 2019-07-16 DIAGNOSIS — I5032 Chronic diastolic (congestive) heart failure: Secondary | ICD-10-CM | POA: Diagnosis not present

## 2019-07-16 DIAGNOSIS — S12400D Unspecified displaced fracture of fifth cervical vertebra, subsequent encounter for fracture with routine healing: Secondary | ICD-10-CM | POA: Diagnosis not present

## 2019-07-16 DIAGNOSIS — G3184 Mild cognitive impairment, so stated: Secondary | ICD-10-CM | POA: Diagnosis not present

## 2019-07-16 DIAGNOSIS — W19XXXD Unspecified fall, subsequent encounter: Secondary | ICD-10-CM | POA: Diagnosis not present

## 2019-07-16 DIAGNOSIS — S12300D Unspecified displaced fracture of fourth cervical vertebra, subsequent encounter for fracture with routine healing: Secondary | ICD-10-CM | POA: Diagnosis not present

## 2019-07-17 DIAGNOSIS — R202 Paresthesia of skin: Secondary | ICD-10-CM | POA: Diagnosis not present

## 2019-07-17 DIAGNOSIS — I110 Hypertensive heart disease with heart failure: Secondary | ICD-10-CM | POA: Diagnosis not present

## 2019-07-17 DIAGNOSIS — S12400D Unspecified displaced fracture of fifth cervical vertebra, subsequent encounter for fracture with routine healing: Secondary | ICD-10-CM | POA: Diagnosis not present

## 2019-07-17 DIAGNOSIS — E038 Other specified hypothyroidism: Secondary | ICD-10-CM | POA: Diagnosis not present

## 2019-07-17 DIAGNOSIS — E7849 Other hyperlipidemia: Secondary | ICD-10-CM | POA: Diagnosis not present

## 2019-07-17 DIAGNOSIS — R7301 Impaired fasting glucose: Secondary | ICD-10-CM | POA: Diagnosis not present

## 2019-07-17 DIAGNOSIS — W19XXXD Unspecified fall, subsequent encounter: Secondary | ICD-10-CM | POA: Diagnosis not present

## 2019-07-17 DIAGNOSIS — S12300D Unspecified displaced fracture of fourth cervical vertebra, subsequent encounter for fracture with routine healing: Secondary | ICD-10-CM | POA: Diagnosis not present

## 2019-07-17 DIAGNOSIS — I5032 Chronic diastolic (congestive) heart failure: Secondary | ICD-10-CM | POA: Diagnosis not present

## 2019-07-17 DIAGNOSIS — G3184 Mild cognitive impairment, so stated: Secondary | ICD-10-CM | POA: Diagnosis not present

## 2019-07-17 DIAGNOSIS — G629 Polyneuropathy, unspecified: Secondary | ICD-10-CM | POA: Diagnosis not present

## 2019-07-21 DIAGNOSIS — S12400D Unspecified displaced fracture of fifth cervical vertebra, subsequent encounter for fracture with routine healing: Secondary | ICD-10-CM | POA: Diagnosis not present

## 2019-07-21 DIAGNOSIS — I5032 Chronic diastolic (congestive) heart failure: Secondary | ICD-10-CM | POA: Diagnosis not present

## 2019-07-21 DIAGNOSIS — G3184 Mild cognitive impairment, so stated: Secondary | ICD-10-CM | POA: Diagnosis not present

## 2019-07-21 DIAGNOSIS — W19XXXD Unspecified fall, subsequent encounter: Secondary | ICD-10-CM | POA: Diagnosis not present

## 2019-07-21 DIAGNOSIS — I110 Hypertensive heart disease with heart failure: Secondary | ICD-10-CM | POA: Diagnosis not present

## 2019-07-21 DIAGNOSIS — S12300D Unspecified displaced fracture of fourth cervical vertebra, subsequent encounter for fracture with routine healing: Secondary | ICD-10-CM | POA: Diagnosis not present

## 2019-07-22 DIAGNOSIS — I5032 Chronic diastolic (congestive) heart failure: Secondary | ICD-10-CM | POA: Diagnosis not present

## 2019-07-22 DIAGNOSIS — W19XXXD Unspecified fall, subsequent encounter: Secondary | ICD-10-CM | POA: Diagnosis not present

## 2019-07-22 DIAGNOSIS — S12400D Unspecified displaced fracture of fifth cervical vertebra, subsequent encounter for fracture with routine healing: Secondary | ICD-10-CM | POA: Diagnosis not present

## 2019-07-22 DIAGNOSIS — S12300D Unspecified displaced fracture of fourth cervical vertebra, subsequent encounter for fracture with routine healing: Secondary | ICD-10-CM | POA: Diagnosis not present

## 2019-07-22 DIAGNOSIS — I110 Hypertensive heart disease with heart failure: Secondary | ICD-10-CM | POA: Diagnosis not present

## 2019-07-22 DIAGNOSIS — G3184 Mild cognitive impairment, so stated: Secondary | ICD-10-CM | POA: Diagnosis not present

## 2019-07-24 DIAGNOSIS — G3184 Mild cognitive impairment, so stated: Secondary | ICD-10-CM | POA: Diagnosis not present

## 2019-07-24 DIAGNOSIS — E039 Hypothyroidism, unspecified: Secondary | ICD-10-CM | POA: Diagnosis not present

## 2019-07-24 DIAGNOSIS — E785 Hyperlipidemia, unspecified: Secondary | ICD-10-CM | POA: Diagnosis not present

## 2019-07-24 DIAGNOSIS — S12400D Unspecified displaced fracture of fifth cervical vertebra, subsequent encounter for fracture with routine healing: Secondary | ICD-10-CM | POA: Diagnosis not present

## 2019-07-24 DIAGNOSIS — W19XXXD Unspecified fall, subsequent encounter: Secondary | ICD-10-CM | POA: Diagnosis not present

## 2019-07-24 DIAGNOSIS — N1831 Chronic kidney disease, stage 3a: Secondary | ICD-10-CM | POA: Diagnosis not present

## 2019-07-24 DIAGNOSIS — Z1331 Encounter for screening for depression: Secondary | ICD-10-CM | POA: Diagnosis not present

## 2019-07-24 DIAGNOSIS — I129 Hypertensive chronic kidney disease with stage 1 through stage 4 chronic kidney disease, or unspecified chronic kidney disease: Secondary | ICD-10-CM | POA: Diagnosis not present

## 2019-07-24 DIAGNOSIS — I110 Hypertensive heart disease with heart failure: Secondary | ICD-10-CM | POA: Diagnosis not present

## 2019-07-24 DIAGNOSIS — Z0000 Encounter for general adult medical examination without abnormal findings: Secondary | ICD-10-CM | POA: Diagnosis not present

## 2019-07-24 DIAGNOSIS — R7301 Impaired fasting glucose: Secondary | ICD-10-CM | POA: Diagnosis not present

## 2019-07-24 DIAGNOSIS — M810 Age-related osteoporosis without current pathological fracture: Secondary | ICD-10-CM | POA: Diagnosis not present

## 2019-07-24 DIAGNOSIS — Z952 Presence of prosthetic heart valve: Secondary | ICD-10-CM | POA: Diagnosis not present

## 2019-07-24 DIAGNOSIS — I5032 Chronic diastolic (congestive) heart failure: Secondary | ICD-10-CM | POA: Diagnosis not present

## 2019-07-24 DIAGNOSIS — S129XXS Fracture of neck, unspecified, sequela: Secondary | ICD-10-CM | POA: Diagnosis not present

## 2019-07-24 DIAGNOSIS — S12300D Unspecified displaced fracture of fourth cervical vertebra, subsequent encounter for fracture with routine healing: Secondary | ICD-10-CM | POA: Diagnosis not present

## 2019-07-25 DIAGNOSIS — W19XXXD Unspecified fall, subsequent encounter: Secondary | ICD-10-CM | POA: Diagnosis not present

## 2019-07-25 DIAGNOSIS — G3184 Mild cognitive impairment, so stated: Secondary | ICD-10-CM | POA: Diagnosis not present

## 2019-07-25 DIAGNOSIS — S12300D Unspecified displaced fracture of fourth cervical vertebra, subsequent encounter for fracture with routine healing: Secondary | ICD-10-CM | POA: Diagnosis not present

## 2019-07-25 DIAGNOSIS — I110 Hypertensive heart disease with heart failure: Secondary | ICD-10-CM | POA: Diagnosis not present

## 2019-07-25 DIAGNOSIS — I5032 Chronic diastolic (congestive) heart failure: Secondary | ICD-10-CM | POA: Diagnosis not present

## 2019-07-25 DIAGNOSIS — S12400D Unspecified displaced fracture of fifth cervical vertebra, subsequent encounter for fracture with routine healing: Secondary | ICD-10-CM | POA: Diagnosis not present

## 2019-07-28 DIAGNOSIS — I110 Hypertensive heart disease with heart failure: Secondary | ICD-10-CM | POA: Diagnosis not present

## 2019-07-28 DIAGNOSIS — E8881 Metabolic syndrome: Secondary | ICD-10-CM | POA: Diagnosis not present

## 2019-07-28 DIAGNOSIS — W19XXXD Unspecified fall, subsequent encounter: Secondary | ICD-10-CM | POA: Diagnosis not present

## 2019-07-28 DIAGNOSIS — I350 Nonrheumatic aortic (valve) stenosis: Secondary | ICD-10-CM | POA: Diagnosis not present

## 2019-07-28 DIAGNOSIS — Z7982 Long term (current) use of aspirin: Secondary | ICD-10-CM | POA: Diagnosis not present

## 2019-07-28 DIAGNOSIS — R1312 Dysphagia, oropharyngeal phase: Secondary | ICD-10-CM | POA: Diagnosis not present

## 2019-07-28 DIAGNOSIS — I5032 Chronic diastolic (congestive) heart failure: Secondary | ICD-10-CM | POA: Diagnosis not present

## 2019-07-28 DIAGNOSIS — M8580 Other specified disorders of bone density and structure, unspecified site: Secondary | ICD-10-CM | POA: Diagnosis not present

## 2019-07-28 DIAGNOSIS — Z9181 History of falling: Secondary | ICD-10-CM | POA: Diagnosis not present

## 2019-07-28 DIAGNOSIS — S12300D Unspecified displaced fracture of fourth cervical vertebra, subsequent encounter for fracture with routine healing: Secondary | ICD-10-CM | POA: Diagnosis not present

## 2019-07-28 DIAGNOSIS — I4891 Unspecified atrial fibrillation: Secondary | ICD-10-CM | POA: Diagnosis not present

## 2019-07-28 DIAGNOSIS — G3184 Mild cognitive impairment, so stated: Secondary | ICD-10-CM | POA: Diagnosis not present

## 2019-07-28 DIAGNOSIS — Z8673 Personal history of transient ischemic attack (TIA), and cerebral infarction without residual deficits: Secondary | ICD-10-CM | POA: Diagnosis not present

## 2019-07-28 DIAGNOSIS — Z952 Presence of prosthetic heart valve: Secondary | ICD-10-CM | POA: Diagnosis not present

## 2019-07-28 DIAGNOSIS — S12400D Unspecified displaced fracture of fifth cervical vertebra, subsequent encounter for fracture with routine healing: Secondary | ICD-10-CM | POA: Diagnosis not present

## 2019-07-29 DIAGNOSIS — I110 Hypertensive heart disease with heart failure: Secondary | ICD-10-CM | POA: Diagnosis not present

## 2019-07-29 DIAGNOSIS — G3184 Mild cognitive impairment, so stated: Secondary | ICD-10-CM | POA: Diagnosis not present

## 2019-07-29 DIAGNOSIS — I5032 Chronic diastolic (congestive) heart failure: Secondary | ICD-10-CM | POA: Diagnosis not present

## 2019-07-29 DIAGNOSIS — S12300D Unspecified displaced fracture of fourth cervical vertebra, subsequent encounter for fracture with routine healing: Secondary | ICD-10-CM | POA: Diagnosis not present

## 2019-07-29 DIAGNOSIS — S12400D Unspecified displaced fracture of fifth cervical vertebra, subsequent encounter for fracture with routine healing: Secondary | ICD-10-CM | POA: Diagnosis not present

## 2019-07-29 DIAGNOSIS — W19XXXD Unspecified fall, subsequent encounter: Secondary | ICD-10-CM | POA: Diagnosis not present

## 2019-07-31 DIAGNOSIS — I5032 Chronic diastolic (congestive) heart failure: Secondary | ICD-10-CM | POA: Diagnosis not present

## 2019-07-31 DIAGNOSIS — G3184 Mild cognitive impairment, so stated: Secondary | ICD-10-CM | POA: Diagnosis not present

## 2019-07-31 DIAGNOSIS — W19XXXD Unspecified fall, subsequent encounter: Secondary | ICD-10-CM | POA: Diagnosis not present

## 2019-07-31 DIAGNOSIS — I110 Hypertensive heart disease with heart failure: Secondary | ICD-10-CM | POA: Diagnosis not present

## 2019-07-31 DIAGNOSIS — S12300D Unspecified displaced fracture of fourth cervical vertebra, subsequent encounter for fracture with routine healing: Secondary | ICD-10-CM | POA: Diagnosis not present

## 2019-07-31 DIAGNOSIS — S12400D Unspecified displaced fracture of fifth cervical vertebra, subsequent encounter for fracture with routine healing: Secondary | ICD-10-CM | POA: Diagnosis not present

## 2019-08-06 DIAGNOSIS — S12300D Unspecified displaced fracture of fourth cervical vertebra, subsequent encounter for fracture with routine healing: Secondary | ICD-10-CM | POA: Diagnosis not present

## 2019-08-06 DIAGNOSIS — W19XXXD Unspecified fall, subsequent encounter: Secondary | ICD-10-CM | POA: Diagnosis not present

## 2019-08-06 DIAGNOSIS — I110 Hypertensive heart disease with heart failure: Secondary | ICD-10-CM | POA: Diagnosis not present

## 2019-08-06 DIAGNOSIS — S12400D Unspecified displaced fracture of fifth cervical vertebra, subsequent encounter for fracture with routine healing: Secondary | ICD-10-CM | POA: Diagnosis not present

## 2019-08-06 DIAGNOSIS — G3184 Mild cognitive impairment, so stated: Secondary | ICD-10-CM | POA: Diagnosis not present

## 2019-08-06 DIAGNOSIS — I5032 Chronic diastolic (congestive) heart failure: Secondary | ICD-10-CM | POA: Diagnosis not present

## 2019-08-08 DIAGNOSIS — S12400D Unspecified displaced fracture of fifth cervical vertebra, subsequent encounter for fracture with routine healing: Secondary | ICD-10-CM | POA: Diagnosis not present

## 2019-08-08 DIAGNOSIS — G3184 Mild cognitive impairment, so stated: Secondary | ICD-10-CM | POA: Diagnosis not present

## 2019-08-08 DIAGNOSIS — W19XXXD Unspecified fall, subsequent encounter: Secondary | ICD-10-CM | POA: Diagnosis not present

## 2019-08-08 DIAGNOSIS — S12300D Unspecified displaced fracture of fourth cervical vertebra, subsequent encounter for fracture with routine healing: Secondary | ICD-10-CM | POA: Diagnosis not present

## 2019-08-08 DIAGNOSIS — I110 Hypertensive heart disease with heart failure: Secondary | ICD-10-CM | POA: Diagnosis not present

## 2019-08-08 DIAGNOSIS — I5032 Chronic diastolic (congestive) heart failure: Secondary | ICD-10-CM | POA: Diagnosis not present

## 2019-08-11 DIAGNOSIS — S12300D Unspecified displaced fracture of fourth cervical vertebra, subsequent encounter for fracture with routine healing: Secondary | ICD-10-CM | POA: Diagnosis not present

## 2019-08-11 DIAGNOSIS — G3184 Mild cognitive impairment, so stated: Secondary | ICD-10-CM | POA: Diagnosis not present

## 2019-08-11 DIAGNOSIS — I110 Hypertensive heart disease with heart failure: Secondary | ICD-10-CM | POA: Diagnosis not present

## 2019-08-11 DIAGNOSIS — W19XXXD Unspecified fall, subsequent encounter: Secondary | ICD-10-CM | POA: Diagnosis not present

## 2019-08-11 DIAGNOSIS — S12400D Unspecified displaced fracture of fifth cervical vertebra, subsequent encounter for fracture with routine healing: Secondary | ICD-10-CM | POA: Diagnosis not present

## 2019-08-11 DIAGNOSIS — I5032 Chronic diastolic (congestive) heart failure: Secondary | ICD-10-CM | POA: Diagnosis not present

## 2019-08-19 DIAGNOSIS — S12300A Unspecified displaced fracture of fourth cervical vertebra, initial encounter for closed fracture: Secondary | ICD-10-CM | POA: Diagnosis not present

## 2019-08-21 DIAGNOSIS — S12300D Unspecified displaced fracture of fourth cervical vertebra, subsequent encounter for fracture with routine healing: Secondary | ICD-10-CM | POA: Diagnosis not present

## 2019-08-21 DIAGNOSIS — G3184 Mild cognitive impairment, so stated: Secondary | ICD-10-CM | POA: Diagnosis not present

## 2019-08-21 DIAGNOSIS — I110 Hypertensive heart disease with heart failure: Secondary | ICD-10-CM | POA: Diagnosis not present

## 2019-08-21 DIAGNOSIS — W19XXXD Unspecified fall, subsequent encounter: Secondary | ICD-10-CM | POA: Diagnosis not present

## 2019-08-21 DIAGNOSIS — S12400D Unspecified displaced fracture of fifth cervical vertebra, subsequent encounter for fracture with routine healing: Secondary | ICD-10-CM | POA: Diagnosis not present

## 2019-08-21 DIAGNOSIS — I5032 Chronic diastolic (congestive) heart failure: Secondary | ICD-10-CM | POA: Diagnosis not present

## 2019-08-27 DIAGNOSIS — S12300A Unspecified displaced fracture of fourth cervical vertebra, initial encounter for closed fracture: Secondary | ICD-10-CM | POA: Diagnosis not present

## 2019-08-27 DIAGNOSIS — W19XXXD Unspecified fall, subsequent encounter: Secondary | ICD-10-CM | POA: Diagnosis not present

## 2019-08-27 DIAGNOSIS — Z9181 History of falling: Secondary | ICD-10-CM | POA: Diagnosis not present

## 2019-08-27 DIAGNOSIS — M5412 Radiculopathy, cervical region: Secondary | ICD-10-CM | POA: Diagnosis not present

## 2019-08-27 DIAGNOSIS — I10 Essential (primary) hypertension: Secondary | ICD-10-CM | POA: Diagnosis not present

## 2019-08-27 DIAGNOSIS — Z7982 Long term (current) use of aspirin: Secondary | ICD-10-CM | POA: Diagnosis not present

## 2019-08-27 DIAGNOSIS — S12300D Unspecified displaced fracture of fourth cervical vertebra, subsequent encounter for fracture with routine healing: Secondary | ICD-10-CM | POA: Diagnosis not present

## 2019-08-29 DIAGNOSIS — W19XXXD Unspecified fall, subsequent encounter: Secondary | ICD-10-CM | POA: Diagnosis not present

## 2019-08-29 DIAGNOSIS — S12300D Unspecified displaced fracture of fourth cervical vertebra, subsequent encounter for fracture with routine healing: Secondary | ICD-10-CM | POA: Diagnosis not present

## 2019-08-29 DIAGNOSIS — Z7982 Long term (current) use of aspirin: Secondary | ICD-10-CM | POA: Diagnosis not present

## 2019-08-29 DIAGNOSIS — Z9181 History of falling: Secondary | ICD-10-CM | POA: Diagnosis not present

## 2019-08-29 DIAGNOSIS — M5412 Radiculopathy, cervical region: Secondary | ICD-10-CM | POA: Diagnosis not present

## 2019-08-29 DIAGNOSIS — I10 Essential (primary) hypertension: Secondary | ICD-10-CM | POA: Diagnosis not present

## 2019-09-01 DIAGNOSIS — M5412 Radiculopathy, cervical region: Secondary | ICD-10-CM | POA: Diagnosis not present

## 2019-09-01 DIAGNOSIS — S12300D Unspecified displaced fracture of fourth cervical vertebra, subsequent encounter for fracture with routine healing: Secondary | ICD-10-CM | POA: Diagnosis not present

## 2019-09-01 DIAGNOSIS — I10 Essential (primary) hypertension: Secondary | ICD-10-CM | POA: Diagnosis not present

## 2019-09-01 DIAGNOSIS — Z7982 Long term (current) use of aspirin: Secondary | ICD-10-CM | POA: Diagnosis not present

## 2019-09-01 DIAGNOSIS — Z9181 History of falling: Secondary | ICD-10-CM | POA: Diagnosis not present

## 2019-09-01 DIAGNOSIS — W19XXXD Unspecified fall, subsequent encounter: Secondary | ICD-10-CM | POA: Diagnosis not present

## 2019-09-03 DIAGNOSIS — S12300D Unspecified displaced fracture of fourth cervical vertebra, subsequent encounter for fracture with routine healing: Secondary | ICD-10-CM | POA: Diagnosis not present

## 2019-09-03 DIAGNOSIS — Z9181 History of falling: Secondary | ICD-10-CM | POA: Diagnosis not present

## 2019-09-03 DIAGNOSIS — W19XXXD Unspecified fall, subsequent encounter: Secondary | ICD-10-CM | POA: Diagnosis not present

## 2019-09-03 DIAGNOSIS — I10 Essential (primary) hypertension: Secondary | ICD-10-CM | POA: Diagnosis not present

## 2019-09-03 DIAGNOSIS — M5412 Radiculopathy, cervical region: Secondary | ICD-10-CM | POA: Diagnosis not present

## 2019-09-03 DIAGNOSIS — Z7982 Long term (current) use of aspirin: Secondary | ICD-10-CM | POA: Diagnosis not present

## 2019-09-04 DIAGNOSIS — N1831 Chronic kidney disease, stage 3a: Secondary | ICD-10-CM | POA: Diagnosis not present

## 2019-09-04 DIAGNOSIS — G3184 Mild cognitive impairment, so stated: Secondary | ICD-10-CM | POA: Diagnosis not present

## 2019-09-04 DIAGNOSIS — M810 Age-related osteoporosis without current pathological fracture: Secondary | ICD-10-CM | POA: Diagnosis not present

## 2019-09-04 DIAGNOSIS — I129 Hypertensive chronic kidney disease with stage 1 through stage 4 chronic kidney disease, or unspecified chronic kidney disease: Secondary | ICD-10-CM | POA: Diagnosis not present

## 2019-09-09 DIAGNOSIS — M5412 Radiculopathy, cervical region: Secondary | ICD-10-CM | POA: Diagnosis not present

## 2019-09-09 DIAGNOSIS — Z7982 Long term (current) use of aspirin: Secondary | ICD-10-CM | POA: Diagnosis not present

## 2019-09-09 DIAGNOSIS — W19XXXD Unspecified fall, subsequent encounter: Secondary | ICD-10-CM | POA: Diagnosis not present

## 2019-09-09 DIAGNOSIS — I10 Essential (primary) hypertension: Secondary | ICD-10-CM | POA: Diagnosis not present

## 2019-09-09 DIAGNOSIS — Z9181 History of falling: Secondary | ICD-10-CM | POA: Diagnosis not present

## 2019-09-09 DIAGNOSIS — S12300D Unspecified displaced fracture of fourth cervical vertebra, subsequent encounter for fracture with routine healing: Secondary | ICD-10-CM | POA: Diagnosis not present

## 2019-09-11 DIAGNOSIS — W19XXXD Unspecified fall, subsequent encounter: Secondary | ICD-10-CM | POA: Diagnosis not present

## 2019-09-11 DIAGNOSIS — M5412 Radiculopathy, cervical region: Secondary | ICD-10-CM | POA: Diagnosis not present

## 2019-09-11 DIAGNOSIS — Z7982 Long term (current) use of aspirin: Secondary | ICD-10-CM | POA: Diagnosis not present

## 2019-09-11 DIAGNOSIS — S12300D Unspecified displaced fracture of fourth cervical vertebra, subsequent encounter for fracture with routine healing: Secondary | ICD-10-CM | POA: Diagnosis not present

## 2019-09-11 DIAGNOSIS — Z9181 History of falling: Secondary | ICD-10-CM | POA: Diagnosis not present

## 2019-09-11 DIAGNOSIS — I10 Essential (primary) hypertension: Secondary | ICD-10-CM | POA: Diagnosis not present

## 2019-09-16 DIAGNOSIS — S12300D Unspecified displaced fracture of fourth cervical vertebra, subsequent encounter for fracture with routine healing: Secondary | ICD-10-CM | POA: Diagnosis not present

## 2019-09-16 DIAGNOSIS — Z9181 History of falling: Secondary | ICD-10-CM | POA: Diagnosis not present

## 2019-09-16 DIAGNOSIS — W19XXXD Unspecified fall, subsequent encounter: Secondary | ICD-10-CM | POA: Diagnosis not present

## 2019-09-16 DIAGNOSIS — Z7982 Long term (current) use of aspirin: Secondary | ICD-10-CM | POA: Diagnosis not present

## 2019-09-16 DIAGNOSIS — I10 Essential (primary) hypertension: Secondary | ICD-10-CM | POA: Diagnosis not present

## 2019-09-16 DIAGNOSIS — M5412 Radiculopathy, cervical region: Secondary | ICD-10-CM | POA: Diagnosis not present

## 2019-09-18 DIAGNOSIS — S12300D Unspecified displaced fracture of fourth cervical vertebra, subsequent encounter for fracture with routine healing: Secondary | ICD-10-CM | POA: Diagnosis not present

## 2019-09-18 DIAGNOSIS — Z9181 History of falling: Secondary | ICD-10-CM | POA: Diagnosis not present

## 2019-09-18 DIAGNOSIS — W19XXXD Unspecified fall, subsequent encounter: Secondary | ICD-10-CM | POA: Diagnosis not present

## 2019-09-18 DIAGNOSIS — Z7982 Long term (current) use of aspirin: Secondary | ICD-10-CM | POA: Diagnosis not present

## 2019-09-18 DIAGNOSIS — I10 Essential (primary) hypertension: Secondary | ICD-10-CM | POA: Diagnosis not present

## 2019-09-18 DIAGNOSIS — M5412 Radiculopathy, cervical region: Secondary | ICD-10-CM | POA: Diagnosis not present

## 2019-09-25 DIAGNOSIS — I10 Essential (primary) hypertension: Secondary | ICD-10-CM | POA: Diagnosis not present

## 2019-09-25 DIAGNOSIS — M5412 Radiculopathy, cervical region: Secondary | ICD-10-CM | POA: Diagnosis not present

## 2019-09-25 DIAGNOSIS — S12300D Unspecified displaced fracture of fourth cervical vertebra, subsequent encounter for fracture with routine healing: Secondary | ICD-10-CM | POA: Diagnosis not present

## 2019-09-25 DIAGNOSIS — Z9181 History of falling: Secondary | ICD-10-CM | POA: Diagnosis not present

## 2019-09-25 DIAGNOSIS — W19XXXD Unspecified fall, subsequent encounter: Secondary | ICD-10-CM | POA: Diagnosis not present

## 2019-09-25 DIAGNOSIS — Z7982 Long term (current) use of aspirin: Secondary | ICD-10-CM | POA: Diagnosis not present

## 2019-09-26 DIAGNOSIS — Z7982 Long term (current) use of aspirin: Secondary | ICD-10-CM | POA: Diagnosis not present

## 2019-09-26 DIAGNOSIS — I10 Essential (primary) hypertension: Secondary | ICD-10-CM | POA: Diagnosis not present

## 2019-09-26 DIAGNOSIS — M5412 Radiculopathy, cervical region: Secondary | ICD-10-CM | POA: Diagnosis not present

## 2019-09-26 DIAGNOSIS — W19XXXD Unspecified fall, subsequent encounter: Secondary | ICD-10-CM | POA: Diagnosis not present

## 2019-09-26 DIAGNOSIS — S12300D Unspecified displaced fracture of fourth cervical vertebra, subsequent encounter for fracture with routine healing: Secondary | ICD-10-CM | POA: Diagnosis not present

## 2019-09-26 DIAGNOSIS — Z9181 History of falling: Secondary | ICD-10-CM | POA: Diagnosis not present

## 2019-09-30 DIAGNOSIS — S12300A Unspecified displaced fracture of fourth cervical vertebra, initial encounter for closed fracture: Secondary | ICD-10-CM | POA: Diagnosis not present

## 2019-10-01 ENCOUNTER — Other Ambulatory Visit (HOSPITAL_COMMUNITY): Payer: Self-pay | Admitting: *Deleted

## 2019-10-01 DIAGNOSIS — Z9181 History of falling: Secondary | ICD-10-CM | POA: Diagnosis not present

## 2019-10-01 DIAGNOSIS — Z7982 Long term (current) use of aspirin: Secondary | ICD-10-CM | POA: Diagnosis not present

## 2019-10-01 DIAGNOSIS — S12300D Unspecified displaced fracture of fourth cervical vertebra, subsequent encounter for fracture with routine healing: Secondary | ICD-10-CM | POA: Diagnosis not present

## 2019-10-01 DIAGNOSIS — M5412 Radiculopathy, cervical region: Secondary | ICD-10-CM | POA: Diagnosis not present

## 2019-10-01 DIAGNOSIS — I10 Essential (primary) hypertension: Secondary | ICD-10-CM | POA: Diagnosis not present

## 2019-10-01 DIAGNOSIS — W19XXXD Unspecified fall, subsequent encounter: Secondary | ICD-10-CM | POA: Diagnosis not present

## 2019-10-02 ENCOUNTER — Other Ambulatory Visit: Payer: Self-pay

## 2019-10-02 ENCOUNTER — Encounter (HOSPITAL_COMMUNITY)
Admission: RE | Admit: 2019-10-02 | Discharge: 2019-10-02 | Disposition: A | Discharge status: Home / Self Care | Payer: Medicare Other | Attending: Internal Medicine | Admitting: Internal Medicine

## 2019-10-02 DIAGNOSIS — M810 Age-related osteoporosis without current pathological fracture: Secondary | ICD-10-CM | POA: Diagnosis not present

## 2019-10-02 MED ORDER — DENOSUMAB 60 MG/ML ~~LOC~~ SOSY
PREFILLED_SYRINGE | SUBCUTANEOUS | Status: AC
  Filled 2019-10-02: qty 1

## 2019-10-02 MED ORDER — DENOSUMAB 60 MG/ML ~~LOC~~ SOSY
60.0000 mg | PREFILLED_SYRINGE | Freq: Once | SUBCUTANEOUS | Status: AC
  Administered 2019-10-02: 60 mg via SUBCUTANEOUS

## 2019-10-02 NOTE — Discharge Instructions (Signed)
Denosumab injection What is this medicine? DENOSUMAB (den oh sue mab) slows bone breakdown. Prolia is used to treat osteoporosis in women after menopause and in men, and in people who are taking corticosteroids for 6 months or more. Xgeva is used to treat a high calcium level due to cancer and to prevent bone fractures and other bone problems caused by multiple myeloma or cancer bone metastases. Xgeva is also used to treat giant cell tumor of the bone. This medicine may be used for other purposes; ask your health care provider or pharmacist if you have questions. COMMON BRAND NAME(S): Prolia, XGEVA What should I tell my health care provider before I take this medicine? They need to know if you have any of these conditions:  dental disease  having surgery or tooth extraction  infection  kidney disease  low levels of calcium or Vitamin D in the blood  malnutrition  on hemodialysis  skin conditions or sensitivity  thyroid or parathyroid disease  an unusual reaction to denosumab, other medicines, foods, dyes, or preservatives  pregnant or trying to get pregnant  breast-feeding How should I use this medicine? This medicine is for injection under the skin. It is given by a health care professional in a hospital or clinic setting. A special MedGuide will be given to you before each treatment. Be sure to read this information carefully each time. For Prolia, talk to your pediatrician regarding the use of this medicine in children. Special care may be needed. For Xgeva, talk to your pediatrician regarding the use of this medicine in children. While this drug may be prescribed for children as young as 13 years for selected conditions, precautions do apply. Overdosage: If you think you have taken too much of this medicine contact a poison control center or emergency room at once. NOTE: This medicine is only for you. Do not share this medicine with others. What if I miss a dose? It is  important not to miss your dose. Call your doctor or health care professional if you are unable to keep an appointment. What may interact with this medicine? Do not take this medicine with any of the following medications:  other medicines containing denosumab This medicine may also interact with the following medications:  medicines that lower your chance of fighting infection  steroid medicines like prednisone or cortisone This list may not describe all possible interactions. Give your health care provider a list of all the medicines, herbs, non-prescription drugs, or dietary supplements you use. Also tell them if you smoke, drink alcohol, or use illegal drugs. Some items may interact with your medicine. What should I watch for while using this medicine? Visit your doctor or health care professional for regular checks on your progress. Your doctor or health care professional may order blood tests and other tests to see how you are doing. Call your doctor or health care professional for advice if you get a fever, chills or sore throat, or other symptoms of a cold or flu. Do not treat yourself. This drug may decrease your body's ability to fight infection. Try to avoid being around people who are sick. You should make sure you get enough calcium and vitamin D while you are taking this medicine, unless your doctor tells you not to. Discuss the foods you eat and the vitamins you take with your health care professional. See your dentist regularly. Brush and floss your teeth as directed. Before you have any dental work done, tell your dentist you are   receiving this medicine. Do not become pregnant while taking this medicine or for 5 months after stopping it. Talk with your doctor or health care professional about your birth control options while taking this medicine. Women should inform their doctor if they wish to become pregnant or think they might be pregnant. There is a potential for serious side  effects to an unborn child. Talk to your health care professional or pharmacist for more information. What side effects may I notice from receiving this medicine? Side effects that you should report to your doctor or health care professional as soon as possible:  allergic reactions like skin rash, itching or hives, swelling of the face, lips, or tongue  bone pain  breathing problems  dizziness  jaw pain, especially after dental work  redness, blistering, peeling of the skin  signs and symptoms of infection like fever or chills; cough; sore throat; pain or trouble passing urine  signs of low calcium like fast heartbeat, muscle cramps or muscle pain; pain, tingling, numbness in the hands or feet; seizures  unusual bleeding or bruising  unusually weak or tired Side effects that usually do not require medical attention (report to your doctor or health care professional if they continue or are bothersome):  constipation  diarrhea  headache  joint pain  loss of appetite  muscle pain  runny nose  tiredness  upset stomach This list may not describe all possible side effects. Call your doctor for medical advice about side effects. You may report side effects to FDA at 1-800-FDA-1088. Where should I keep my medicine? This medicine is only given in a clinic, doctor's office, or other health care setting and will not be stored at home. NOTE: This sheet is a summary. It may not cover all possible information. If you have questions about this medicine, talk to your doctor, pharmacist, or health care provider.  2020 Elsevier/Gold Standard (2017-05-04 16:10:44)

## 2019-10-07 DIAGNOSIS — M5412 Radiculopathy, cervical region: Secondary | ICD-10-CM | POA: Diagnosis not present

## 2019-10-07 DIAGNOSIS — Z7982 Long term (current) use of aspirin: Secondary | ICD-10-CM | POA: Diagnosis not present

## 2019-10-07 DIAGNOSIS — Z9181 History of falling: Secondary | ICD-10-CM | POA: Diagnosis not present

## 2019-10-07 DIAGNOSIS — I10 Essential (primary) hypertension: Secondary | ICD-10-CM | POA: Diagnosis not present

## 2019-10-07 DIAGNOSIS — S12300D Unspecified displaced fracture of fourth cervical vertebra, subsequent encounter for fracture with routine healing: Secondary | ICD-10-CM | POA: Diagnosis not present

## 2019-10-07 DIAGNOSIS — W19XXXD Unspecified fall, subsequent encounter: Secondary | ICD-10-CM | POA: Diagnosis not present

## 2019-10-08 DIAGNOSIS — M25561 Pain in right knee: Secondary | ICD-10-CM | POA: Diagnosis not present

## 2019-10-08 DIAGNOSIS — M25551 Pain in right hip: Secondary | ICD-10-CM | POA: Diagnosis not present

## 2019-10-08 DIAGNOSIS — M545 Low back pain: Secondary | ICD-10-CM | POA: Diagnosis not present

## 2019-10-15 DIAGNOSIS — W19XXXD Unspecified fall, subsequent encounter: Secondary | ICD-10-CM | POA: Diagnosis not present

## 2019-10-15 DIAGNOSIS — S12300D Unspecified displaced fracture of fourth cervical vertebra, subsequent encounter for fracture with routine healing: Secondary | ICD-10-CM | POA: Diagnosis not present

## 2019-10-15 DIAGNOSIS — I10 Essential (primary) hypertension: Secondary | ICD-10-CM | POA: Diagnosis not present

## 2019-10-15 DIAGNOSIS — Z7982 Long term (current) use of aspirin: Secondary | ICD-10-CM | POA: Diagnosis not present

## 2019-10-15 DIAGNOSIS — Z9181 History of falling: Secondary | ICD-10-CM | POA: Diagnosis not present

## 2019-10-15 DIAGNOSIS — M5412 Radiculopathy, cervical region: Secondary | ICD-10-CM | POA: Diagnosis not present

## 2019-10-16 DIAGNOSIS — Z23 Encounter for immunization: Secondary | ICD-10-CM | POA: Diagnosis not present

## 2019-10-21 DIAGNOSIS — S12300D Unspecified displaced fracture of fourth cervical vertebra, subsequent encounter for fracture with routine healing: Secondary | ICD-10-CM | POA: Diagnosis not present

## 2019-10-21 DIAGNOSIS — Z9181 History of falling: Secondary | ICD-10-CM | POA: Diagnosis not present

## 2019-10-21 DIAGNOSIS — Z7982 Long term (current) use of aspirin: Secondary | ICD-10-CM | POA: Diagnosis not present

## 2019-10-21 DIAGNOSIS — W19XXXD Unspecified fall, subsequent encounter: Secondary | ICD-10-CM | POA: Diagnosis not present

## 2019-10-21 DIAGNOSIS — I10 Essential (primary) hypertension: Secondary | ICD-10-CM | POA: Diagnosis not present

## 2019-10-21 DIAGNOSIS — M5412 Radiculopathy, cervical region: Secondary | ICD-10-CM | POA: Diagnosis not present

## 2019-10-24 DIAGNOSIS — M25551 Pain in right hip: Secondary | ICD-10-CM | POA: Diagnosis not present

## 2019-10-24 DIAGNOSIS — M5459 Other low back pain: Secondary | ICD-10-CM | POA: Diagnosis not present

## 2019-10-24 DIAGNOSIS — M545 Low back pain, unspecified: Secondary | ICD-10-CM | POA: Diagnosis not present

## 2019-10-26 DIAGNOSIS — Z7982 Long term (current) use of aspirin: Secondary | ICD-10-CM | POA: Diagnosis not present

## 2019-10-26 DIAGNOSIS — I10 Essential (primary) hypertension: Secondary | ICD-10-CM | POA: Diagnosis not present

## 2019-10-26 DIAGNOSIS — S12300D Unspecified displaced fracture of fourth cervical vertebra, subsequent encounter for fracture with routine healing: Secondary | ICD-10-CM | POA: Diagnosis not present

## 2019-10-26 DIAGNOSIS — Z791 Long term (current) use of non-steroidal anti-inflammatories (NSAID): Secondary | ICD-10-CM | POA: Diagnosis not present

## 2019-10-26 DIAGNOSIS — W19XXXD Unspecified fall, subsequent encounter: Secondary | ICD-10-CM | POA: Diagnosis not present

## 2019-10-26 DIAGNOSIS — M5412 Radiculopathy, cervical region: Secondary | ICD-10-CM | POA: Diagnosis not present

## 2019-10-26 DIAGNOSIS — Z9181 History of falling: Secondary | ICD-10-CM | POA: Diagnosis not present

## 2019-10-30 DIAGNOSIS — W19XXXD Unspecified fall, subsequent encounter: Secondary | ICD-10-CM | POA: Diagnosis not present

## 2019-10-30 DIAGNOSIS — Z7982 Long term (current) use of aspirin: Secondary | ICD-10-CM | POA: Diagnosis not present

## 2019-10-30 DIAGNOSIS — Z9181 History of falling: Secondary | ICD-10-CM | POA: Diagnosis not present

## 2019-10-30 DIAGNOSIS — S12300D Unspecified displaced fracture of fourth cervical vertebra, subsequent encounter for fracture with routine healing: Secondary | ICD-10-CM | POA: Diagnosis not present

## 2019-10-30 DIAGNOSIS — M5412 Radiculopathy, cervical region: Secondary | ICD-10-CM | POA: Diagnosis not present

## 2019-10-30 DIAGNOSIS — I10 Essential (primary) hypertension: Secondary | ICD-10-CM | POA: Diagnosis not present

## 2019-11-04 DIAGNOSIS — M545 Low back pain, unspecified: Secondary | ICD-10-CM | POA: Diagnosis not present

## 2019-11-04 DIAGNOSIS — M5136 Other intervertebral disc degeneration, lumbar region: Secondary | ICD-10-CM | POA: Diagnosis not present

## 2019-11-04 DIAGNOSIS — M25551 Pain in right hip: Secondary | ICD-10-CM | POA: Diagnosis not present

## 2019-11-04 DIAGNOSIS — M48061 Spinal stenosis, lumbar region without neurogenic claudication: Secondary | ICD-10-CM | POA: Diagnosis not present

## 2019-11-04 DIAGNOSIS — M5126 Other intervertebral disc displacement, lumbar region: Secondary | ICD-10-CM | POA: Diagnosis not present

## 2019-11-06 DIAGNOSIS — Z7982 Long term (current) use of aspirin: Secondary | ICD-10-CM | POA: Diagnosis not present

## 2019-11-06 DIAGNOSIS — Z9181 History of falling: Secondary | ICD-10-CM | POA: Diagnosis not present

## 2019-11-06 DIAGNOSIS — W19XXXD Unspecified fall, subsequent encounter: Secondary | ICD-10-CM | POA: Diagnosis not present

## 2019-11-06 DIAGNOSIS — I10 Essential (primary) hypertension: Secondary | ICD-10-CM | POA: Diagnosis not present

## 2019-11-06 DIAGNOSIS — M5412 Radiculopathy, cervical region: Secondary | ICD-10-CM | POA: Diagnosis not present

## 2019-11-06 DIAGNOSIS — S12300D Unspecified displaced fracture of fourth cervical vertebra, subsequent encounter for fracture with routine healing: Secondary | ICD-10-CM | POA: Diagnosis not present

## 2019-11-12 DIAGNOSIS — W19XXXD Unspecified fall, subsequent encounter: Secondary | ICD-10-CM | POA: Diagnosis not present

## 2019-11-12 DIAGNOSIS — Z7982 Long term (current) use of aspirin: Secondary | ICD-10-CM | POA: Diagnosis not present

## 2019-11-12 DIAGNOSIS — Z9181 History of falling: Secondary | ICD-10-CM | POA: Diagnosis not present

## 2019-11-12 DIAGNOSIS — S12300D Unspecified displaced fracture of fourth cervical vertebra, subsequent encounter for fracture with routine healing: Secondary | ICD-10-CM | POA: Diagnosis not present

## 2019-11-12 DIAGNOSIS — M5412 Radiculopathy, cervical region: Secondary | ICD-10-CM | POA: Diagnosis not present

## 2019-11-12 DIAGNOSIS — I10 Essential (primary) hypertension: Secondary | ICD-10-CM | POA: Diagnosis not present

## 2019-11-18 DIAGNOSIS — S12300D Unspecified displaced fracture of fourth cervical vertebra, subsequent encounter for fracture with routine healing: Secondary | ICD-10-CM | POA: Diagnosis not present

## 2019-11-18 DIAGNOSIS — W19XXXD Unspecified fall, subsequent encounter: Secondary | ICD-10-CM | POA: Diagnosis not present

## 2019-11-18 DIAGNOSIS — I10 Essential (primary) hypertension: Secondary | ICD-10-CM | POA: Diagnosis not present

## 2019-11-18 DIAGNOSIS — Z9181 History of falling: Secondary | ICD-10-CM | POA: Diagnosis not present

## 2019-11-18 DIAGNOSIS — Z7982 Long term (current) use of aspirin: Secondary | ICD-10-CM | POA: Diagnosis not present

## 2019-11-18 DIAGNOSIS — M5412 Radiculopathy, cervical region: Secondary | ICD-10-CM | POA: Diagnosis not present

## 2019-11-25 DIAGNOSIS — W19XXXD Unspecified fall, subsequent encounter: Secondary | ICD-10-CM | POA: Diagnosis not present

## 2019-11-25 DIAGNOSIS — S12300D Unspecified displaced fracture of fourth cervical vertebra, subsequent encounter for fracture with routine healing: Secondary | ICD-10-CM | POA: Diagnosis not present

## 2019-11-25 DIAGNOSIS — I10 Essential (primary) hypertension: Secondary | ICD-10-CM | POA: Diagnosis not present

## 2019-11-25 DIAGNOSIS — M5412 Radiculopathy, cervical region: Secondary | ICD-10-CM | POA: Diagnosis not present

## 2019-11-25 DIAGNOSIS — Z7982 Long term (current) use of aspirin: Secondary | ICD-10-CM | POA: Diagnosis not present

## 2019-11-25 DIAGNOSIS — Z9181 History of falling: Secondary | ICD-10-CM | POA: Diagnosis not present

## 2019-11-25 DIAGNOSIS — Z791 Long term (current) use of non-steroidal anti-inflammatories (NSAID): Secondary | ICD-10-CM | POA: Diagnosis not present

## 2019-11-26 DIAGNOSIS — S12300D Unspecified displaced fracture of fourth cervical vertebra, subsequent encounter for fracture with routine healing: Secondary | ICD-10-CM | POA: Diagnosis not present

## 2019-11-26 DIAGNOSIS — M5412 Radiculopathy, cervical region: Secondary | ICD-10-CM | POA: Diagnosis not present

## 2019-11-26 DIAGNOSIS — Z9181 History of falling: Secondary | ICD-10-CM | POA: Diagnosis not present

## 2019-11-26 DIAGNOSIS — I10 Essential (primary) hypertension: Secondary | ICD-10-CM | POA: Diagnosis not present

## 2019-11-26 DIAGNOSIS — Z7982 Long term (current) use of aspirin: Secondary | ICD-10-CM | POA: Diagnosis not present

## 2019-11-26 DIAGNOSIS — W19XXXD Unspecified fall, subsequent encounter: Secondary | ICD-10-CM | POA: Diagnosis not present

## 2019-12-03 DIAGNOSIS — Z7982 Long term (current) use of aspirin: Secondary | ICD-10-CM | POA: Diagnosis not present

## 2019-12-03 DIAGNOSIS — M5412 Radiculopathy, cervical region: Secondary | ICD-10-CM | POA: Diagnosis not present

## 2019-12-03 DIAGNOSIS — W19XXXD Unspecified fall, subsequent encounter: Secondary | ICD-10-CM | POA: Diagnosis not present

## 2019-12-03 DIAGNOSIS — I10 Essential (primary) hypertension: Secondary | ICD-10-CM | POA: Diagnosis not present

## 2019-12-03 DIAGNOSIS — S12300D Unspecified displaced fracture of fourth cervical vertebra, subsequent encounter for fracture with routine healing: Secondary | ICD-10-CM | POA: Diagnosis not present

## 2019-12-03 DIAGNOSIS — Z9181 History of falling: Secondary | ICD-10-CM | POA: Diagnosis not present

## 2019-12-08 DIAGNOSIS — S12300D Unspecified displaced fracture of fourth cervical vertebra, subsequent encounter for fracture with routine healing: Secondary | ICD-10-CM | POA: Diagnosis not present

## 2019-12-08 DIAGNOSIS — Z7982 Long term (current) use of aspirin: Secondary | ICD-10-CM | POA: Diagnosis not present

## 2019-12-08 DIAGNOSIS — W19XXXD Unspecified fall, subsequent encounter: Secondary | ICD-10-CM | POA: Diagnosis not present

## 2019-12-08 DIAGNOSIS — M5412 Radiculopathy, cervical region: Secondary | ICD-10-CM | POA: Diagnosis not present

## 2019-12-08 DIAGNOSIS — I10 Essential (primary) hypertension: Secondary | ICD-10-CM | POA: Diagnosis not present

## 2019-12-08 DIAGNOSIS — Z9181 History of falling: Secondary | ICD-10-CM | POA: Diagnosis not present

## 2019-12-17 DIAGNOSIS — W19XXXD Unspecified fall, subsequent encounter: Secondary | ICD-10-CM | POA: Diagnosis not present

## 2019-12-17 DIAGNOSIS — S12300D Unspecified displaced fracture of fourth cervical vertebra, subsequent encounter for fracture with routine healing: Secondary | ICD-10-CM | POA: Diagnosis not present

## 2019-12-17 DIAGNOSIS — I10 Essential (primary) hypertension: Secondary | ICD-10-CM | POA: Diagnosis not present

## 2019-12-17 DIAGNOSIS — M5412 Radiculopathy, cervical region: Secondary | ICD-10-CM | POA: Diagnosis not present

## 2019-12-17 DIAGNOSIS — Z9181 History of falling: Secondary | ICD-10-CM | POA: Diagnosis not present

## 2019-12-17 DIAGNOSIS — Z7982 Long term (current) use of aspirin: Secondary | ICD-10-CM | POA: Diagnosis not present

## 2019-12-23 DIAGNOSIS — M5412 Radiculopathy, cervical region: Secondary | ICD-10-CM | POA: Diagnosis not present

## 2019-12-23 DIAGNOSIS — S12300D Unspecified displaced fracture of fourth cervical vertebra, subsequent encounter for fracture with routine healing: Secondary | ICD-10-CM | POA: Diagnosis not present

## 2019-12-23 DIAGNOSIS — Z9181 History of falling: Secondary | ICD-10-CM | POA: Diagnosis not present

## 2019-12-23 DIAGNOSIS — W19XXXD Unspecified fall, subsequent encounter: Secondary | ICD-10-CM | POA: Diagnosis not present

## 2019-12-23 DIAGNOSIS — I10 Essential (primary) hypertension: Secondary | ICD-10-CM | POA: Diagnosis not present

## 2019-12-23 DIAGNOSIS — Z7982 Long term (current) use of aspirin: Secondary | ICD-10-CM | POA: Diagnosis not present

## 2020-01-12 ENCOUNTER — Telehealth: Payer: Self-pay | Admitting: Internal Medicine

## 2020-01-19 DIAGNOSIS — L718 Other rosacea: Secondary | ICD-10-CM | POA: Diagnosis not present

## 2020-01-19 DIAGNOSIS — D2271 Melanocytic nevi of right lower limb, including hip: Secondary | ICD-10-CM | POA: Diagnosis not present

## 2020-01-19 DIAGNOSIS — L821 Other seborrheic keratosis: Secondary | ICD-10-CM | POA: Diagnosis not present

## 2020-01-19 DIAGNOSIS — D2272 Melanocytic nevi of left lower limb, including hip: Secondary | ICD-10-CM | POA: Diagnosis not present

## 2020-01-19 DIAGNOSIS — D225 Melanocytic nevi of trunk: Secondary | ICD-10-CM | POA: Diagnosis not present

## 2020-03-22 DIAGNOSIS — M810 Age-related osteoporosis without current pathological fracture: Secondary | ICD-10-CM | POA: Diagnosis not present

## 2020-03-22 DIAGNOSIS — E785 Hyperlipidemia, unspecified: Secondary | ICD-10-CM | POA: Diagnosis not present

## 2020-04-01 ENCOUNTER — Other Ambulatory Visit (HOSPITAL_COMMUNITY): Payer: Self-pay

## 2020-04-02 ENCOUNTER — Ambulatory Visit (HOSPITAL_COMMUNITY)
Admission: RE | Admit: 2020-04-02 | Discharge: 2020-04-02 | Disposition: A | Discharge status: Home / Self Care | Payer: Medicare Other | Source: Ambulatory Visit | Attending: Internal Medicine | Admitting: Internal Medicine

## 2020-04-02 ENCOUNTER — Other Ambulatory Visit: Payer: Self-pay

## 2020-04-02 DIAGNOSIS — M810 Age-related osteoporosis without current pathological fracture: Secondary | ICD-10-CM | POA: Insufficient documentation

## 2020-04-02 MED ORDER — DENOSUMAB 60 MG/ML ~~LOC~~ SOSY
60.0000 mg | PREFILLED_SYRINGE | Freq: Once | SUBCUTANEOUS | Status: AC
  Administered 2020-04-02: 60 mg via SUBCUTANEOUS

## 2020-04-02 MED ORDER — DENOSUMAB 60 MG/ML ~~LOC~~ SOSY
PREFILLED_SYRINGE | SUBCUTANEOUS | Status: AC
  Filled 2020-04-02: qty 1

## 2020-07-14 NOTE — Progress Notes (Deleted)
Megan Pitts Date of Birth: 21-Oct-1927 Medical Record #[id number]  History of Present Illness: Megan Pitts is seen back today for a follow up Aortic valve disease. She has a history of severe AS and had AVR with a #35mm [place] stentless porcine valve in July of 2007. Echo in 1/18 and 6/20 showed good valve function with only trivial AI. Other issues include HTN, PVCs, HLD, post op atrial fib, GERD, Gilbert's syndrome, and carotid disease. She has a history of bradycardia when taking beta blockers.   She did suffer a fall in May with resultant nondisplaced fracture of C4-5 managed conservatively. Followed by Dr Rolena Infante. Still in a neck brace and walking with a walker. Prior to her fall she was quite active. She denies any chest pain, SOB, palpitations, dizziness.     Current Outpatient Medications  Medication Sig Dispense Refill   acetaminophen (TYLENOL) 500 MG tablet Take 500 mg by mouth every 6 (six) hours as needed for mild pain.     amLODipine (NORVASC) 10 MG tablet Take 10 mg by mouth daily.     aspirin 81 MG tablet Take 81 mg by mouth daily.       donepezil (ARICEPT) 10 MG tablet Take 10 mg by mouth at bedtime.   10   hydrochlorothiazide (HYDRODIURIL) 25 MG tablet Take 25 mg by mouth daily.     ibuprofen (ADVIL,MOTRIN) 400 MG tablet Take 1 tablet (400 mg total) by mouth every 6 (six) hours as needed for mild pain or moderate pain. 30 tablet 0   methocarbamol (ROBAXIN) 500 MG tablet Take 1 tablet by mouth as needed. For spasms/muscle tension     olmesartan (BENICAR) 40 MG tablet Take 40 mg by mouth daily.     Vitamin D, Ergocalciferol, (DRISDOL) 50000 units CAPS capsule Take 50,000 Units by mouth every 7 (seven) days. On Sundays     No current facility-administered medications for this visit.    Allergies  Allergen Reactions   Synthroid [Levothyroxine Sodium]     Pt stated not sure what happens when she takes this    Past Medical History:  Diagnosis Date   A-fib [facility])    Aortic  stenosis    s/p AVR 07/2005(porcine)   Carotid bruit 09/2008   <50% B   GERD (gastroesophageal reflux disease)    Gilbert's syndrome    Hyperlipidemia    Hypertension    IFG (impaired fasting glucose)    OAB (overactive bladder)    Osteopenia    Phlebitis    UPPER EXTREMITY   PVC's (premature ventricular contractions)    Shingles    Varicose veins    s/p treatment    Vertigo     Past Surgical History:  Procedure Laterality Date   ABDOMINAL HYSTERECTOMY  1963   partial   AORTIC VALVE REPLACEMENT  08/01/2005   WITH A #23MM [place] STENTLESS PORCINE AORTIC VALVE   CARDIAC CATHETERIZATION  07/25/2005   EF 60% THE AORTIC VALVE IS HEAVILY CALCIFIED WITH REDUCED OPENING. NO MVP   FOOT SURGERY     HAND SURGERY     HEMORRHOID SURGERY  1980's   US ECHOCARDIOGRAPHY  04/14/2008   EF 55-60%. NORMAL   US ECHOCARDIOGRAPHY  11/21/2005   EF 55-60%   US ECHOCARDIOGRAPHY  07/20/2005   EF 55-60%   US ECHOCARDIOGRAPHY  10/19/2004   EF 55-60%   VARICOSE VEIN SURGERY  2010   laser treatment    Social History   Tobacco Use  Smoking Status Never  Smokeless Tobacco Never    Social History   Substance and Sexual Activity  Alcohol Use No    Family History  Problem Relation Age of Onset   Pneumonia Mother 5   Alzheimer's disease Father    Leukemia Brother    Alcohol abuse Brother     Review of Systems: The review of systems is per the HPI.  All other systems were reviewed and are negative.  Physical Exam: There were no vitals taken for this visit. GENERAL:  Well appearing WF appears younger than stated age.  HEENT:  PERRL, EOMI, sclera are clear. Oropharynx is clear. NECK:  No jugular venous distention, carotid upstroke brisk and symmetric, no bruits, no thyromegaly or adenopathy LUNGS:  Clear to auscultation bilaterally CHEST:  Unremarkable HEART:  RRR,  PMI not displaced or sustained,S1 and S2 within normal limits, no S3, no S4: no clicks, no rubs, There is a gr 6-2/2  systolic murmur RUSB radiating to carotids.  ABD:  Soft, nontender. BS +, no masses or bruits. No hepatomegaly, no splenomegaly EXT:  2 + pulses throughout, no edema, no cyanosis no clubbing SKIN:  Warm and dry.  No rashes NEURO:  Alert and oriented x 3. Cranial nerves II through XII intact. PSYCH:  Cognitively intact    LABORATORY DATA: Lab Results  Component Value Date   WBC 8.4 06/04/2019   HGB 13.5 06/04/2019   HCT 39.6 06/04/2019   PLT 288 06/04/2019   GLUCOSE 175 (H) 06/05/2019   ALT 19 06/04/2019   AST 16 06/04/2019   NA 134 (L) 06/05/2019   K 3.7 06/05/2019   CL 102 06/05/2019   CREATININE 1.05 (H) 06/05/2019   BUN 14 06/05/2019   CO2 21 (L) 06/05/2019   INR 0.98 09/24/2017   Labs dated 05/11/16: cholesterol 181, triglycerides 84, HDL 68, LDL 96. Dated 05/04/16: normal chemistries, Hgb, and TSH. Dated 05/23/18: cholesterol 294, triglycerides 123, HDL 71, LDL 198. A1c 5.4%. Dated 07/17/19: cholesterol 259, triglycerides 144, HDL 64, LDL 166. A1c 5.4%. CMET and CBC normal.    Ecg today. NSR with rate 69 bpm. Otherwise normal. I have personally reviewed and interpreted this study.   Echo 02/03/16: Study Conclusions   - Left ventricle: The cavity size was normal. Wall thickness was   increased in a pattern of mild LVH. Doppler parameters are   consistent with abnormal left ventricular relaxation (grade 1   diastolic dysfunction). - Aortic valve: AV opens well Peak and mean gradients through the   valve are 23 and 13 mm Hg respectively. There was trivial   regurgitation. - Left atrium: The atrium was mildly dilated.  Echo 06/20/18: IMPRESSIONS     1. The left ventricle has hyperdynamic systolic function, with an  ejection fraction of >65%. The cavity size was normal. Left ventricular  diastolic Doppler parameters are consistent with impaired relaxation.   2. The right ventricle has normal systolic function. The cavity was  normal. There is no increase in right  ventricular wall thickness.   3. Aortic valve regurgitation is trivial by color flow Doppler. No  stenosis of the aortic valve.   4. The aortic root and ascending aorta are normal in size and structure.   5. The interatrial septum was not assessed.    Assessment / Plan:  1. S/P AVR with tissue prosthesis in 2007- Stable Echo in 2018 and in 2020. Asymptomatic.   2. HTN - controlled. Continue current therapy. Avoid beta blocker.   3. Hyperlipidemia  4.  Cervical fracture in neck brace.

## 2020-07-16 ENCOUNTER — Ambulatory Visit: Payer: Medicare Other | Admitting: Cardiology

## 2020-07-20 DIAGNOSIS — E039 Hypothyroidism, unspecified: Secondary | ICD-10-CM | POA: Diagnosis not present

## 2020-07-20 DIAGNOSIS — E785 Hyperlipidemia, unspecified: Secondary | ICD-10-CM | POA: Diagnosis not present

## 2020-07-20 DIAGNOSIS — M810 Age-related osteoporosis without current pathological fracture: Secondary | ICD-10-CM | POA: Diagnosis not present

## 2020-07-20 DIAGNOSIS — M109 Gout, unspecified: Secondary | ICD-10-CM | POA: Diagnosis not present

## 2020-07-27 DIAGNOSIS — I700 Atherosclerosis of aorta: Secondary | ICD-10-CM | POA: Diagnosis not present

## 2020-07-27 DIAGNOSIS — Z1339 Encounter for screening examination for other mental health and behavioral disorders: Secondary | ICD-10-CM | POA: Diagnosis not present

## 2020-07-27 DIAGNOSIS — Z952 Presence of prosthetic heart valve: Secondary | ICD-10-CM | POA: Diagnosis not present

## 2020-07-27 DIAGNOSIS — E785 Hyperlipidemia, unspecified: Secondary | ICD-10-CM | POA: Diagnosis not present

## 2020-07-27 DIAGNOSIS — N1831 Chronic kidney disease, stage 3a: Secondary | ICD-10-CM | POA: Diagnosis not present

## 2020-07-27 DIAGNOSIS — G300 Alzheimer's disease with early onset: Secondary | ICD-10-CM | POA: Diagnosis not present

## 2020-07-27 DIAGNOSIS — I350 Nonrheumatic aortic (valve) stenosis: Secondary | ICD-10-CM | POA: Diagnosis not present

## 2020-07-27 DIAGNOSIS — R82998 Other abnormal findings in urine: Secondary | ICD-10-CM | POA: Diagnosis not present

## 2020-07-27 DIAGNOSIS — M810 Age-related osteoporosis without current pathological fracture: Secondary | ICD-10-CM | POA: Diagnosis not present

## 2020-07-27 DIAGNOSIS — F0280 Dementia in other diseases classified elsewhere without behavioral disturbance: Secondary | ICD-10-CM | POA: Diagnosis not present

## 2020-07-27 DIAGNOSIS — Z0000 Encounter for general adult medical examination without abnormal findings: Secondary | ICD-10-CM | POA: Diagnosis not present

## 2020-07-27 DIAGNOSIS — E039 Hypothyroidism, unspecified: Secondary | ICD-10-CM | POA: Diagnosis not present

## 2020-07-27 DIAGNOSIS — Z1331 Encounter for screening for depression: Secondary | ICD-10-CM | POA: Diagnosis not present

## 2020-07-27 DIAGNOSIS — I129 Hypertensive chronic kidney disease with stage 1 through stage 4 chronic kidney disease, or unspecified chronic kidney disease: Secondary | ICD-10-CM | POA: Diagnosis not present

## 2020-07-27 DIAGNOSIS — N3941 Urge incontinence: Secondary | ICD-10-CM | POA: Diagnosis not present

## 2020-09-08 DIAGNOSIS — I10 Essential (primary) hypertension: Secondary | ICD-10-CM | POA: Diagnosis not present

## 2020-09-08 DIAGNOSIS — N1831 Chronic kidney disease, stage 3a: Secondary | ICD-10-CM | POA: Diagnosis not present

## 2020-09-08 DIAGNOSIS — E039 Hypothyroidism, unspecified: Secondary | ICD-10-CM | POA: Diagnosis not present

## 2020-09-08 DIAGNOSIS — E785 Hyperlipidemia, unspecified: Secondary | ICD-10-CM | POA: Diagnosis not present

## 2020-09-10 ENCOUNTER — Inpatient Hospital Stay (HOSPITAL_COMMUNITY)
Admission: EM | Admit: 2020-09-10 | Discharge: 2020-09-16 | DRG: 493 | Disposition: A | Discharge status: Skilled Nursing Facility | Payer: Medicare Other | Attending: Internal Medicine | Admitting: Internal Medicine

## 2020-09-10 ENCOUNTER — Emergency Department (HOSPITAL_COMMUNITY): Payer: Medicare Other

## 2020-09-10 ENCOUNTER — Inpatient Hospital Stay (HOSPITAL_COMMUNITY): Payer: Medicare Other

## 2020-09-10 ENCOUNTER — Other Ambulatory Visit: Payer: Self-pay

## 2020-09-10 ENCOUNTER — Encounter (HOSPITAL_COMMUNITY): Payer: Self-pay

## 2020-09-10 DIAGNOSIS — R5381 Other malaise: Secondary | ICD-10-CM | POA: Diagnosis not present

## 2020-09-10 DIAGNOSIS — M810 Age-related osteoporosis without current pathological fracture: Secondary | ICD-10-CM | POA: Diagnosis present

## 2020-09-10 DIAGNOSIS — Z952 Presence of prosthetic heart valve: Secondary | ICD-10-CM | POA: Diagnosis not present

## 2020-09-10 DIAGNOSIS — Z820 Family history of epilepsy and other diseases of the nervous system: Secondary | ICD-10-CM | POA: Diagnosis not present

## 2020-09-10 DIAGNOSIS — S82831A Other fracture of upper and lower end of right fibula, initial encounter for closed fracture: Secondary | ICD-10-CM | POA: Diagnosis not present

## 2020-09-10 DIAGNOSIS — Z8672 Personal history of thrombophlebitis: Secondary | ICD-10-CM

## 2020-09-10 DIAGNOSIS — S9304XA Dislocation of right ankle joint, initial encounter: Secondary | ICD-10-CM | POA: Diagnosis not present

## 2020-09-10 DIAGNOSIS — Z811 Family history of alcohol abuse and dependence: Secondary | ICD-10-CM

## 2020-09-10 DIAGNOSIS — K5900 Constipation, unspecified: Secondary | ICD-10-CM | POA: Diagnosis not present

## 2020-09-10 DIAGNOSIS — W010XXA Fall on same level from slipping, tripping and stumbling without subsequent striking against object, initial encounter: Secondary | ICD-10-CM | POA: Diagnosis present

## 2020-09-10 DIAGNOSIS — I69828 Other speech and language deficits following other cerebrovascular disease: Secondary | ICD-10-CM | POA: Diagnosis not present

## 2020-09-10 DIAGNOSIS — S82891A Other fracture of right lower leg, initial encounter for closed fracture: Secondary | ICD-10-CM | POA: Diagnosis present

## 2020-09-10 DIAGNOSIS — D72829 Elevated white blood cell count, unspecified: Secondary | ICD-10-CM | POA: Diagnosis present

## 2020-09-10 DIAGNOSIS — S82851A Displaced trimalleolar fracture of right lower leg, initial encounter for closed fracture: Secondary | ICD-10-CM | POA: Diagnosis not present

## 2020-09-10 DIAGNOSIS — K219 Gastro-esophageal reflux disease without esophagitis: Secondary | ICD-10-CM | POA: Diagnosis present

## 2020-09-10 DIAGNOSIS — I5032 Chronic diastolic (congestive) heart failure: Secondary | ICD-10-CM | POA: Diagnosis present

## 2020-09-10 DIAGNOSIS — I350 Nonrheumatic aortic (valve) stenosis: Secondary | ICD-10-CM | POA: Diagnosis not present

## 2020-09-10 DIAGNOSIS — I493 Ventricular premature depolarization: Secondary | ICD-10-CM | POA: Diagnosis not present

## 2020-09-10 DIAGNOSIS — I10 Essential (primary) hypertension: Secondary | ICD-10-CM | POA: Diagnosis present

## 2020-09-10 DIAGNOSIS — N3281 Overactive bladder: Secondary | ICD-10-CM | POA: Diagnosis present

## 2020-09-10 DIAGNOSIS — E785 Hyperlipidemia, unspecified: Secondary | ICD-10-CM | POA: Diagnosis present

## 2020-09-10 DIAGNOSIS — X501XXA Overexertion from prolonged static or awkward postures, initial encounter: Secondary | ICD-10-CM

## 2020-09-10 DIAGNOSIS — N182 Chronic kidney disease, stage 2 (mild): Secondary | ICD-10-CM | POA: Diagnosis not present

## 2020-09-10 DIAGNOSIS — Z953 Presence of xenogenic heart valve: Secondary | ICD-10-CM

## 2020-09-10 DIAGNOSIS — S82899A Other fracture of unspecified lower leg, initial encounter for closed fracture: Secondary | ICD-10-CM | POA: Diagnosis not present

## 2020-09-10 DIAGNOSIS — I5033 Acute on chronic diastolic (congestive) heart failure: Secondary | ICD-10-CM | POA: Diagnosis present

## 2020-09-10 DIAGNOSIS — Z09 Encounter for follow-up examination after completed treatment for conditions other than malignant neoplasm: Secondary | ICD-10-CM

## 2020-09-10 DIAGNOSIS — Z888 Allergy status to other drugs, medicaments and biological substances status: Secondary | ICD-10-CM | POA: Diagnosis not present

## 2020-09-10 DIAGNOSIS — I130 Hypertensive heart and chronic kidney disease with heart failure and stage 1 through stage 4 chronic kidney disease, or unspecified chronic kidney disease: Secondary | ICD-10-CM | POA: Diagnosis present

## 2020-09-10 DIAGNOSIS — R1111 Vomiting without nausea: Secondary | ICD-10-CM | POA: Diagnosis not present

## 2020-09-10 DIAGNOSIS — R0902 Hypoxemia: Secondary | ICD-10-CM | POA: Diagnosis not present

## 2020-09-10 DIAGNOSIS — S82891D Other fracture of right lower leg, subsequent encounter for closed fracture with routine healing: Secondary | ICD-10-CM | POA: Diagnosis not present

## 2020-09-10 DIAGNOSIS — I4891 Unspecified atrial fibrillation: Secondary | ICD-10-CM | POA: Diagnosis present

## 2020-09-10 DIAGNOSIS — S93431A Sprain of tibiofibular ligament of right ankle, initial encounter: Secondary | ICD-10-CM | POA: Diagnosis not present

## 2020-09-10 DIAGNOSIS — Z20822 Contact with and (suspected) exposure to covid-19: Secondary | ICD-10-CM | POA: Diagnosis present

## 2020-09-10 DIAGNOSIS — Z7401 Bed confinement status: Secondary | ICD-10-CM | POA: Diagnosis not present

## 2020-09-10 DIAGNOSIS — R2681 Unsteadiness on feet: Secondary | ICD-10-CM | POA: Diagnosis not present

## 2020-09-10 DIAGNOSIS — Z9181 History of falling: Secondary | ICD-10-CM

## 2020-09-10 DIAGNOSIS — S51011A Laceration without foreign body of right elbow, initial encounter: Secondary | ICD-10-CM | POA: Diagnosis present

## 2020-09-10 DIAGNOSIS — R262 Difficulty in walking, not elsewhere classified: Secondary | ICD-10-CM | POA: Diagnosis not present

## 2020-09-10 DIAGNOSIS — S9301XA Subluxation of right ankle joint, initial encounter: Secondary | ICD-10-CM | POA: Diagnosis not present

## 2020-09-10 DIAGNOSIS — Z806 Family history of leukemia: Secondary | ICD-10-CM | POA: Diagnosis not present

## 2020-09-10 DIAGNOSIS — I129 Hypertensive chronic kidney disease with stage 1 through stage 4 chronic kidney disease, or unspecified chronic kidney disease: Secondary | ICD-10-CM | POA: Diagnosis not present

## 2020-09-10 DIAGNOSIS — R41841 Cognitive communication deficit: Secondary | ICD-10-CM | POA: Diagnosis not present

## 2020-09-10 DIAGNOSIS — W19XXXA Unspecified fall, initial encounter: Secondary | ICD-10-CM | POA: Diagnosis not present

## 2020-09-10 DIAGNOSIS — M6281 Muscle weakness (generalized): Secondary | ICD-10-CM | POA: Diagnosis not present

## 2020-09-10 DIAGNOSIS — Z01818 Encounter for other preprocedural examination: Secondary | ICD-10-CM | POA: Diagnosis not present

## 2020-09-10 DIAGNOSIS — R110 Nausea: Secondary | ICD-10-CM | POA: Diagnosis not present

## 2020-09-10 DIAGNOSIS — S0990XA Unspecified injury of head, initial encounter: Secondary | ICD-10-CM | POA: Diagnosis not present

## 2020-09-10 DIAGNOSIS — S8251XA Displaced fracture of medial malleolus of right tibia, initial encounter for closed fracture: Secondary | ICD-10-CM | POA: Diagnosis not present

## 2020-09-10 LAB — CBC WITH DIFFERENTIAL/PLATELET
Abs Immature Granulocytes: 0.06 10*3/uL (ref 0.00–0.07)
Basophils Absolute: 0.1 10*3/uL (ref 0.0–0.1)
Basophils Relative: 0 %
Eosinophils Absolute: 0.1 10*3/uL (ref 0.0–0.5)
Eosinophils Relative: 0 %
HCT: 43 % (ref 36.0–46.0)
Hemoglobin: 14.4 g/dL (ref 12.0–15.0)
Immature Granulocytes: 0 %
Lymphocytes Relative: 10 %
Lymphs Abs: 1.4 10*3/uL (ref 0.7–4.0)
MCH: 29.9 pg (ref 26.0–34.0)
MCHC: 33.5 g/dL (ref 30.0–36.0)
MCV: 89.4 fL (ref 80.0–100.0)
Monocytes Absolute: 0.9 10*3/uL (ref 0.1–1.0)
Monocytes Relative: 7 %
Neutro Abs: 11 10*3/uL — ABNORMAL HIGH (ref 1.7–7.7)
Neutrophils Relative %: 83 %
Platelets: 375 10*3/uL (ref 150–400)
RBC: 4.81 MIL/uL (ref 3.87–5.11)
RDW: 12.7 % (ref 11.5–15.5)
WBC: 13.5 10*3/uL — ABNORMAL HIGH (ref 4.0–10.5)
nRBC: 0 % (ref 0.0–0.2)

## 2020-09-10 LAB — BASIC METABOLIC PANEL
Anion gap: 11 (ref 5–15)
BUN: 21 mg/dL (ref 8–23)
CO2: 24 mmol/L (ref 22–32)
Calcium: 9.2 mg/dL (ref 8.9–10.3)
Chloride: 103 mmol/L (ref 98–111)
Creatinine, Ser: 1.18 mg/dL — ABNORMAL HIGH (ref 0.44–1.00)
GFR, Estimated: 44 mL/min — ABNORMAL LOW (ref >60)
Glucose, Bld: 132 mg/dL — ABNORMAL HIGH (ref 70–99)
Potassium: 3.5 mmol/L (ref 3.5–5.1)
Sodium: 138 mmol/L (ref 135–145)

## 2020-09-10 LAB — I-STAT CHEM 8, ED
BUN: 22 mg/dL (ref 8–23)
Calcium, Ion: 1 mmol/L — ABNORMAL LOW (ref 1.15–1.40)
Chloride: 108 mmol/L (ref 98–111)
Creatinine, Ser: 1 mg/dL (ref 0.44–1.00)
Glucose, Bld: 129 mg/dL — ABNORMAL HIGH (ref 70–99)
HCT: 43 % (ref 36.0–46.0)
Hemoglobin: 14.6 g/dL (ref 12.0–15.0)
Potassium: 3.5 mmol/L (ref 3.5–5.1)
Sodium: 138 mmol/L (ref 135–145)
TCO2: 22 mmol/L (ref 22–32)

## 2020-09-10 LAB — RESP PANEL BY RT-PCR (FLU A&B, COVID) ARPGX2
Influenza A by PCR: NEGATIVE
Influenza B by PCR: NEGATIVE
SARS Coronavirus 2 by RT PCR: NEGATIVE

## 2020-09-10 MED ORDER — MORPHINE SULFATE (PF) 2 MG/ML IV SOLN
2.0000 mg | Freq: Once | INTRAVENOUS | Status: AC
  Administered 2020-09-10: 2 mg via INTRAVENOUS
  Filled 2020-09-10: qty 1

## 2020-09-10 MED ORDER — MORPHINE SULFATE (PF) 2 MG/ML IV SOLN: 1.0000 mg | INTRAVENOUS | Status: DC | PRN

## 2020-09-10 MED ORDER — HYDROCODONE-ACETAMINOPHEN 5-325 MG PO TABS
1.0000 | ORAL_TABLET | ORAL | Status: DC | PRN
  Administered 2020-09-10 – 2020-09-11 (×5): 1 via ORAL
  Filled 2020-09-10 (×5): qty 1

## 2020-09-10 MED ORDER — SODIUM CHLORIDE 0.9% FLUSH: 3.0000 mL | INTRAVENOUS | Status: DC | PRN

## 2020-09-10 MED ORDER — SODIUM CHLORIDE 0.9% FLUSH
3.0000 mL | Freq: Two times a day (BID) | INTRAVENOUS | Status: DC
  Administered 2020-09-11 – 2020-09-16 (×12): 3 mL via INTRAVENOUS

## 2020-09-10 MED ORDER — ETOMIDATE 2 MG/ML IV SOLN
10.0000 mg | Freq: Once | INTRAVENOUS | Status: DC
  Filled 2020-09-10: qty 10

## 2020-09-10 MED ORDER — HEPARIN SODIUM (PORCINE) 5000 UNIT/ML IJ SOLN: 5000.0000 [IU] | Freq: Three times a day (TID) | INTRAMUSCULAR | Status: DC

## 2020-09-10 MED ORDER — MORPHINE SULFATE (PF) 4 MG/ML IV SOLN: 4.0000 mg | Freq: Once | INTRAVENOUS | Status: DC

## 2020-09-10 MED ORDER — ETOMIDATE 2 MG/ML IV SOLN
INTRAVENOUS | Status: AC | PRN
Start: 2020-09-10 — End: 2020-09-10
  Administered 2020-09-10: 5 mg via INTRAVENOUS

## 2020-09-10 MED ORDER — ACETAMINOPHEN 650 MG RE SUPP: 650.0000 mg | Freq: Four times a day (QID) | RECTAL | Status: DC | PRN

## 2020-09-10 MED ORDER — ONDANSETRON HCL 4 MG PO TABS: 4.0000 mg | ORAL_TABLET | Freq: Four times a day (QID) | ORAL | Status: DC | PRN

## 2020-09-10 MED ORDER — SODIUM CHLORIDE 0.9 % IV SOLN: 250.0000 mL | INTRAVENOUS | Status: DC | PRN

## 2020-09-10 MED ORDER — HYDROCHLOROTHIAZIDE 25 MG PO TABS
25.0000 mg | ORAL_TABLET | Freq: Every day | ORAL | Status: DC
  Administered 2020-09-11 – 2020-09-16 (×6): 25 mg via ORAL
  Filled 2020-09-10 (×6): qty 1

## 2020-09-10 MED ORDER — ACETAMINOPHEN 325 MG PO TABS: 650.0000 mg | ORAL_TABLET | Freq: Four times a day (QID) | ORAL | Status: DC | PRN

## 2020-09-10 MED ORDER — HYDRALAZINE HCL 20 MG/ML IJ SOLN: 5.0000 mg | Freq: Four times a day (QID) | INTRAMUSCULAR | Status: DC | PRN

## 2020-09-10 MED ORDER — DONEPEZIL HCL 10 MG PO TABS
10.0000 mg | ORAL_TABLET | Freq: Every day | ORAL | Status: DC
  Administered 2020-09-10 – 2020-09-15 (×6): 10 mg via ORAL
  Filled 2020-09-10 (×6): qty 1

## 2020-09-10 MED ORDER — ONDANSETRON HCL 4 MG/2ML IJ SOLN: 4.0000 mg | Freq: Four times a day (QID) | INTRAMUSCULAR | Status: DC | PRN

## 2020-09-10 MED ORDER — ONDANSETRON HCL 4 MG/2ML IJ SOLN
4.0000 mg | Freq: Once | INTRAMUSCULAR | Status: AC
  Administered 2020-09-10: 4 mg via INTRAVENOUS
  Filled 2020-09-10: qty 2

## 2020-09-10 MED ORDER — IRBESARTAN 300 MG PO TABS
300.0000 mg | ORAL_TABLET | Freq: Every day | ORAL | Status: DC
  Administered 2020-09-11 – 2020-09-16 (×6): 300 mg via ORAL
  Filled 2020-09-10 (×6): qty 1

## 2020-09-10 MED ORDER — AMLODIPINE BESYLATE 10 MG PO TABS
10.0000 mg | ORAL_TABLET | Freq: Every day | ORAL | Status: DC
  Administered 2020-09-11 – 2020-09-16 (×6): 10 mg via ORAL
  Filled 2020-09-10 (×6): qty 1

## 2020-09-10 NOTE — ED Triage Notes (Signed)
Pt arrived to ED via EMS from home. Pt rolled her R ankle and then sat down. Skin tear to R elbow. Pt having intermittent spasms to R leg causing her to raise her leg off the bed. Obvious deformity to R ankle w/ R ankle turned outwards to the R. Per EMS decreased sensation and pulses to R foot. 13mg fentanyl and '4mg'$  zofran given. 20g L wrist. VSS w/ EMS

## 2020-09-10 NOTE — ED Notes (Signed)
Attempted to call report, advised pt will be going to [facility] instead of 22 which is still dirty and they will advise when it is ready.

## 2020-09-10 NOTE — Progress Notes (Signed)
Orthopedic Tech Progress Note Patient Details:  Megan Pitts 1927-02-18 [id number] Assisted PA with splint after reduction Ortho Devices Type of Ortho Device: Post (short leg) splint Ortho Device/Splint Location: RLE Ortho Device/Splint Interventions: Application, Ordered   Post Interventions Patient Tolerated: Well  Marshawn Ninneman A Dayzee Trower 09/10/2020, 1:49 PM

## 2020-09-10 NOTE — ED Provider Notes (Signed)
[facility] EMERGENCY DEPARTMENT Provider Note   CSN: [id number] Arrival date & time: 09/10/20  1228     History Chief Complaint  Patient presents with   Ankle Deformity    Megan Pitts is a 87 y.o. female.  Patient with history significant of A. fib, aortic stenosis, GERD, Gilbert's syndrome, hyperlipidemia, hypertension -- presents the emergency department today for evaluation of ankle injury.  Patient states that she was trying to take a step up when she twisted her ankle.  She did not hit her head or lose consciousness.  Patient had obvious deformity of the ankle.  She was transported by EMS.  She was treated with 100 mcg of fentanyl and had subsequent vomiting.  She is having spasms in her leg.  Denies acute knee or hip pain.  No chest pain or abdominal pain.  No other medical complaints other than R elbow skin tear.   Does note that she fell and hit her head several days ago. No sequela reported.       Past Medical History:  Diagnosis Date   A-fib [facility])    Aortic stenosis    s/p AVR 07/2005(porcine)   Carotid bruit 09/2008   <50% B   GERD (gastroesophageal reflux disease)    Gilbert's syndrome    Hyperlipidemia    Hypertension    IFG (impaired fasting glucose)    OAB (overactive bladder)    Osteopenia    Phlebitis    UPPER EXTREMITY   PVC's (premature ventricular contractions)    Shingles    Varicose veins    s/p treatment    Vertigo     Patient Active Problem List   Diagnosis Date Noted   Cervical spine fracture ([place]) 06/03/2019   Syncope, vasovagal 09/27/2017   Syncope and collapse 09/26/2017   Fracture dislocation of right shoulder joint 09/24/2017   Shoulder fracture, right 09/24/2017   Chronic diastolic CHF (congestive heart failure) ([place]) 09/24/2017   Leucocytosis 09/24/2017   Cellulitis 05/05/2015   S/P AVR 10/26/2010   Aortic stenosis    Hypertension    PVC's (premature ventricular contractions)    Hyperlipidemia     Past  Surgical History:  Procedure Laterality Date   ABDOMINAL HYSTERECTOMY  1963   partial   AORTIC VALVE REPLACEMENT  08/01/2005   WITH A #23MM TORONTO STENTLESS PORCINE AORTIC VALVE   CARDIAC CATHETERIZATION  07/25/2005   EF 60% THE AORTIC VALVE IS HEAVILY CALCIFIED WITH REDUCED OPENING. NO MVP   FOOT SURGERY     HAND SURGERY     HEMORRHOID SURGERY  1980's   US ECHOCARDIOGRAPHY  04/14/2008   EF 55-60%. NORMAL   US ECHOCARDIOGRAPHY  11/21/2005   EF 55-60%   US ECHOCARDIOGRAPHY  07/20/2005   EF 55-60%   US ECHOCARDIOGRAPHY  10/19/2004   EF 55-60%   VARICOSE VEIN SURGERY  2010   laser treatment     OB History   No obstetric history on file.     Family History  Problem Relation Age of Onset   Pneumonia Mother 53   Alzheimer's disease Father    Leukemia Brother    Alcohol abuse Brother     Social History   Tobacco Use   Smoking status: Never   Smokeless tobacco: Never  Vaping Use   Vaping Use: Never used  Substance Use Topics   Alcohol use: No   Drug use: No    Home Medications Prior to Admission medications   Medication Sig Start  Date End Date Taking? Authorizing Provider  acetaminophen (TYLENOL) 500 MG tablet Take 500 mg by mouth every 6 (six) hours as needed for mild pain.    [provider]  amLODipine (NORVASC) 10 MG tablet Take 10 mg by mouth daily. 03/19/19   [provider]  aspirin 81 MG tablet Take 81 mg by mouth daily.      [provider]  donepezil (ARICEPT) 10 MG tablet Take 10 mg by mouth at bedtime.  01/11/15   [provider]  hydrochlorothiazide (HYDRODIURIL) 25 MG tablet Take 25 mg by mouth daily. 03/19/19   [provider]  ibuprofen (ADVIL,MOTRIN) 400 MG tablet Take 1 tablet (400 mg total) by mouth every 6 (six) hours as needed for mild pain or moderate pain. 09/29/17   Shahmehdi, Valeria Batman, MD  methocarbamol (ROBAXIN) 500 MG tablet Take 1 tablet by mouth as needed. For spasms/muscle tension 05/12/19   [provider]  olmesartan (BENICAR) 40 MG tablet Take 40 mg by mouth daily.    [provider]  Vitamin D, Ergocalciferol, (DRISDOL) 50000 units CAPS capsule Take 50,000 Units by mouth every 7 (seven) days. On Sundays    [provider]    Allergies    Synthroid [levothyroxine sodium]  Review of Systems   Review of Systems  Constitutional:  Negative for fever.  HENT:  Negative for rhinorrhea and sore throat.   Eyes:  Negative for redness.  Respiratory:  Negative for cough.   Cardiovascular:  Negative for chest pain.  Gastrointestinal:  Negative for abdominal pain, diarrhea, nausea and vomiting.  Genitourinary:  Negative for dysuria, frequency, hematuria and urgency.  Musculoskeletal:  Positive for arthralgias, joint swelling and myalgias.  Skin:  Negative for rash.  Neurological:  Negative for headaches.   Physical Exam Updated Vital Signs BP 137/68   Pulse 69   Temp 97.6 F (36.4 C)   Resp (!) 21   Ht 5' 3.5" (1.613 m)   Wt 72.6 kg   SpO2 98%   BMI 27.90 kg/m   Physical Exam Vitals and nursing note reviewed.  Constitutional:      General: She is not in acute distress.    Appearance: She is well-developed.  HENT:     Head: Normocephalic and atraumatic.     Right Ear: External ear normal.     Left Ear: External ear normal.     Nose: Nose normal.  Eyes:     Conjunctiva/sclera: Conjunctivae normal.  Cardiovascular:     Rate and Rhythm: Normal rate and regular rhythm.     Heart sounds: No murmur heard. Pulmonary:     Effort: No respiratory distress.     Breath sounds: No wheezing, rhonchi or rales.  Abdominal:     Palpations: Abdomen is soft.     Tenderness: There is no abdominal tenderness. There is no guarding or rebound.  Musculoskeletal:     Cervical back: Normal range of motion and neck supple.     Right lower leg: No edema.     Left lower leg: No edema.     Comments: Right ankle: Obvious deformity.  2+ DP pulse, easily palpated.  Patient  is very tender with any movement of the ankle.  She has intermittent spasms of her right posterior leg.  Skin:    General: Skin is warm and dry.     Findings: No rash.  Neurological:     General: No focal deficit present.     Mental Status: She  is alert. Mental status is at baseline.     Motor: No weakness.  Psychiatric:        Mood and Affect: Mood normal.    ED Results / Procedures / Treatments   Labs (all labs ordered are listed, but only abnormal results are displayed) Labs Reviewed  CBC WITH DIFFERENTIAL/PLATELET - Abnormal; Notable for the following components:      Result Value   WBC 13.5 (*)    Neutro Abs 11.0 (*)    All other components within normal limits  I-STAT CHEM 8, ED - Abnormal; Notable for the following components:   Glucose, Bld 129 (*)    Calcium, Ion 1.00 (*)    All other components within normal limits  RESP PANEL BY RT-PCR (FLU A&B, COVID) ARPGX2  BASIC METABOLIC PANEL    EKG EKG Interpretation  Date/Time:  Friday September 10 2020 12:49:51 EDT Ventricular Rate:  73 PR Interval:  186 QRS Duration: 120 QT Interval:  438 QTC Calculation: 483 R Axis:   118 Text Interpretation: Sinus rhythm Atrial premature complex Consider left atrial enlargement Nonspecific intraventricular conduction delay Confirmed by Fredia Sorrow [phone]) on 09/10/2020 1:02:08 PM  Radiology DG Ankle Right Port  Result Date: 09/10/2020 CLINICAL DATA:  Ankle fracture.  Fall with ankle deformity. EXAM: PORTABLE RIGHT ANKLE - 2 VIEW COMPARISON:  09/18/2013 right foot radiographs FINDINGS: There are comminuted, displaced, and angulated fractures of the distal tibia and fibula. There is lateral and posterior dislocation of the talus relative to the tibia. A medial malleolus fragment remains approximated with the talus. The lateral malleolus is displaced laterally and angulated laterally relative to the proximal fibula fragment. There is a small plantar calcaneal enthesophyte. Mild soft  tissue swelling is noted at the ankle. IMPRESSION: Right ankle fracture-dislocation. Electronically Signed   By: Logan Bores M.D.   On: 09/10/2020 13:15    Procedures Reduction of dislocation  Date/Time: 09/10/2020 2:28 PM Performed by: Carlisle Cater, PA-C Authorized by: Carlisle Cater, PA-C  Consent: Written consent obtained. Consent given by: patient Patient understanding: patient states understanding of the procedure being performed Patient consent: the patient's understanding of the procedure matches consent given Procedure consent: procedure consent matches procedure scheduled Imaging studies: imaging studies available Required items: required blood products, implants, devices, and special equipment available Patient identity confirmed: verbally with patient, arm band and provided demographic data Time out: Immediately prior to procedure a "time out" was called to verify the correct patient, procedure, equipment, support staff and site/side marked as required. Local anesthesia used: no  Anesthesia: Local anesthesia used: no  Sedation: Patient sedated: yes  Patient tolerance: patient tolerated the procedure well with no immediate complications Comments: Ankle reduced with manual traction, splinted by ortho tech.      Medications Ordered in ED Medications  etomidate (AMIDATE) injection 10 mg (10 mg Intravenous Not Given 09/10/20 1341)  ondansetron (ZOFRAN) injection 4 mg (4 mg Intravenous Given 09/10/20 1319)  etomidate (AMIDATE) injection (5 mg Intravenous Given 09/10/20 1333)  morphine 2 MG/ML injection 2 mg (2 mg Intravenous Given 09/10/20 1350)    ED Course  I have reviewed the triage vital signs and the nursing notes.  Pertinent labs & imaging results that were available during my care of the patient were reviewed by me and considered in my medical decision making (see chart for details).  Patient seen and examined. Obvious deformity, closed. 2+ pedal pulse. She has spasms  and lifts her ankle. Vomiting ceased at time of my exam.  Vital signs reviewed and are as follows: BP 137/68   Pulse 69   Temp 97.6 F (36.4 C)   Resp (!) 21   Ht 5' 3.5" (1.613 m)   Wt 72.6 kg   SpO2 98%   BMI 27.90 kg/m   Pt seen by Dr. Rogene Houston.  Will need bedside reduction and splinting.  Patient lives by herself.  Daughter at bedside does not have a living situation to which this patient can return as her many stairs.  She will be admitted for PT/OT evaluation, possible need for placement, orthopedic consultation.  1:43 PM Reduction performed. See Dr. Gloris Manchester sedation note.   3:29 PM postreduction imaging reviewed.  Patient discussed with Dr. Rogers Blocker of [facility] hospitalist who will see.    MDM Rules/Calculators/A&P                           Admit.   Final Clinical Impression(s) / ED Diagnoses Final diagnoses:  Ankle fracture  Closed fracture of right ankle, initial encounter    Rx / DC Orders ED Discharge Orders     None        Carlisle Cater, PA-C 09/10/20 1530    Fredia Sorrow, MD 09/12/20 [phone]

## 2020-09-10 NOTE — Consult Note (Signed)
Reason for Consult:Right ankle fx Referring Physician: Fredia Sorrow Time called: [id number] Time at bedside: Megan Pitts is an 87 y.o. female.  HPI: Nyhla was walking and misstepped and fell, twisting her ankle. She had immediate pain and could not get up or bear weight. She was brought to the ED where x-rays showed an ankle fx and orthopedic surgery was consulted. She lives at home alone and usually ambulates with the aid of a RW.  Past Medical History:  Diagnosis Date   A-fib [facility])    Aortic stenosis    s/p AVR 07/2005(porcine)   Carotid bruit 09/2008   <50% B   GERD (gastroesophageal reflux disease)    Gilbert's syndrome    Hyperlipidemia    Hypertension    IFG (impaired fasting glucose)    OAB (overactive bladder)    Osteopenia    Phlebitis    UPPER EXTREMITY   PVC's (premature ventricular contractions)    Shingles    Varicose veins    s/p treatment    Vertigo     Past Surgical History:  Procedure Laterality Date   ABDOMINAL HYSTERECTOMY  1963   partial   AORTIC VALVE REPLACEMENT  08/01/2005   WITH A #23MM TORONTO STENTLESS PORCINE AORTIC VALVE   CARDIAC CATHETERIZATION  07/25/2005   EF 60% THE AORTIC VALVE IS HEAVILY CALCIFIED WITH REDUCED OPENING. NO MVP   FOOT SURGERY     HAND SURGERY     HEMORRHOID SURGERY  1980's   US ECHOCARDIOGRAPHY  04/14/2008   EF 55-60%. NORMAL   US ECHOCARDIOGRAPHY  11/21/2005   EF 55-60%   US ECHOCARDIOGRAPHY  07/20/2005   EF 55-60%   US ECHOCARDIOGRAPHY  10/19/2004   EF 55-60%   VARICOSE VEIN SURGERY  2010   laser treatment    Family History  Problem Relation Age of Onset   Pneumonia Mother 55   Alzheimer's disease Father    Leukemia Brother    Alcohol abuse Brother     Social History:  reports that she has never smoked. She has never used smokeless tobacco. She reports that she does not drink alcohol and does not use drugs.  Allergies:  Allergies  Allergen Reactions   Synthroid [Levothyroxine Sodium]     Pt stated not  sure what happens when she takes this    Medications: I have reviewed the patient's current medications.  No results found for this or any previous visit (from the past 48 hour(s)).  DG Ankle Right Port  Result Date: 09/10/2020 CLINICAL DATA:  Ankle fracture.  Fall with ankle deformity. EXAM: PORTABLE RIGHT ANKLE - 2 VIEW COMPARISON:  09/18/2013 right foot radiographs FINDINGS: There are comminuted, displaced, and angulated fractures of the distal tibia and fibula. There is lateral and posterior dislocation of the talus relative to the tibia. A medial malleolus fragment remains approximated with the talus. The lateral malleolus is displaced laterally and angulated laterally relative to the proximal fibula fragment. There is a small plantar calcaneal enthesophyte. Mild soft tissue swelling is noted at the ankle. IMPRESSION: Right ankle fracture-dislocation. Electronically Signed   By: Logan Bores M.D.   On: 09/10/2020 13:15    Review of Systems  HENT:  Negative for ear discharge, ear pain, hearing loss and tinnitus.   Eyes:  Negative for photophobia and pain.  Respiratory:  Negative for cough and shortness of breath.   Cardiovascular:  Negative for chest pain.  Gastrointestinal:  Negative for abdominal pain, nausea and vomiting.  Genitourinary:  Negative for dysuria, flank pain, frequency and urgency.  Musculoskeletal:  Positive for arthralgias (Right ankle). Negative for back pain, myalgias and neck pain.  Neurological:  Negative for dizziness and headaches.  Hematological:  Does not bruise/bleed easily.  Psychiatric/Behavioral:  The patient is not nervous/anxious.   Blood pressure 137/68, pulse 69, temperature 97.6 F (36.4 C), resp. rate (!) 21, height 5' 3.5" (1.613 m), weight 72.6 kg, SpO2 98 %. Physical Exam Constitutional:      General: She is not in acute distress.    Appearance: She is well-developed. She is not diaphoretic.  HENT:     Head: Normocephalic and atraumatic.  Eyes:      General: No scleral icterus.       Right eye: No discharge.        Left eye: No discharge.     Conjunctiva/sclera: Conjunctivae normal.  Cardiovascular:     Rate and Rhythm: Normal rate and regular rhythm.  Pulmonary:     Effort: Pulmonary effort is normal. No respiratory distress.  Musculoskeletal:     Cervical back: Normal range of motion.     Comments: RLE No traumatic wounds, ecchymosis, or rash  Ankle deformity, severe TTP, mild edema  No knee effusion  Knee stable to varus/ valgus and anterior/posterior stress  Sens SPN, TN intact, DPN paresthetic  Motor EHL 5/5  DP 2+, PT 0, No significant edema  Skin:    General: Skin is warm and dry.  Neurological:     Mental Status: She is alert.  Psychiatric:        Mood and Affect: Mood normal.        Behavior: Behavior normal.    Assessment/Plan: Right ankle fx -- EDP/PA to reduce ankle and splint. Will likely need delayed ORIF to allow swelling to subside but may attempt during this admission as it sounds like she'll need admission for PT/OT and possibly placement. NWB.    Lisette Abu, PA-C Orthopedic Surgery [phone] 09/10/2020, 1:25 PM

## 2020-09-10 NOTE — ED Notes (Signed)
Pt transported to xray at this time

## 2020-09-10 NOTE — H&P (Addendum)
History and Physical    Megan Pitts [id number] DOB: 08-10-1927 DOA: 09/10/2020  PCP: Megan Pitts., MD Consultants:  cardiology: dr. [place] Patient coming from:  Home - lives alone  Chief Complaint: fall and twisted ankle   HPI: Megan Pitts is a 87 y.o. female with medical history significant of HTN, aortic steonsis s/p AVR in 2007, HLD,  chronic diastolic CHF who presented to ER after she fell and tripped in her backyard.  She was walking and took a step up in her backyard and missed the step and fell down on her right ankle and twisted it. She had immediate pain and was unable to bear weight. No chest pain, palpitations or syncope.  Her daughter states "her ankle was off to the side." Did not hit head.  Did not have immediate swelling. Pain rated as a 10/10 and sharp in nature. No radiation. History of falls with broken neck and shoulder in the past. hx of osteoporosis and sounds like she is on prolia. She takes vitamin D daily.   She has been feeling well. Denies any fever/chills, headaches, dizziness, lightheadedness, chest pain, palpitations, shortness of breath, cough, stomach pain, N/V/D, dysuria, weight gain.   ED Course: vitals: Afebrile, 137/68, heart rate 69, respiratory rate 21, oxygen 98% on room air.  Pertinent labs: WBC 13.5, creatinine 1.18.  Right ankle x-ray: Denuded, displaced and angulated fractures of the distal tibia and fibula.  Medial malleolus fragment remains approximated with the talus.  The lateral malleolus is displaced laterally and angulated laterally relative to the proximal fibula fragment.  CT head no acute findings.  Given Zofran and morphine in ER.  Ortho consulted and we were asked to admit  Review of Systems: As per HPI; otherwise review of systems reviewed and negative.   Ambulatory Status:  Ambulates with rolling walker    Past Medical History:  Diagnosis Date   A-fib [facility])    Aortic stenosis    s/p AVR 07/2005(porcine)   Carotid bruit 09/2008    <50% B   GERD (gastroesophageal reflux disease)    Gilbert's syndrome    Hyperlipidemia    Hypertension    IFG (impaired fasting glucose)    OAB (overactive bladder)    Osteopenia    Phlebitis    UPPER EXTREMITY   PVC's (premature ventricular contractions)    Shingles    Varicose veins    s/p treatment    Vertigo     Past Surgical History:  Procedure Laterality Date   ABDOMINAL HYSTERECTOMY  1963   partial   AORTIC VALVE REPLACEMENT  08/01/2005   WITH A #23MM TORONTO STENTLESS PORCINE AORTIC VALVE   CARDIAC CATHETERIZATION  07/25/2005   EF 60% THE AORTIC VALVE IS HEAVILY CALCIFIED WITH REDUCED OPENING. NO MVP   FOOT SURGERY     HAND SURGERY     HEMORRHOID SURGERY  1980's   US ECHOCARDIOGRAPHY  04/14/2008   EF 55-60%. NORMAL   US ECHOCARDIOGRAPHY  11/21/2005   EF 55-60%   US ECHOCARDIOGRAPHY  07/20/2005   EF 55-60%   US ECHOCARDIOGRAPHY  10/19/2004   EF 55-60%   VARICOSE VEIN SURGERY  2010   laser treatment    Social History   Socioeconomic History   Marital status: Widowed    Spouse name: Not on file   Number of children: 2   Years of education: 2   Highest education level: 12th grade  Occupational History   Occupation: retired  Tobacco Use  Smoking status: Never   Smokeless tobacco: Never  Vaping Use   Vaping Use: Never used  Substance and Sexual Activity   Alcohol use: No   Drug use: No   Sexual activity: Not Currently  Other Topics Concern   Not on file  Social History Narrative   Not on file   Social Determinants of Health   Financial Resource Strain: Not on file  Food Insecurity: Not on file  Transportation Needs: Not on file  Physical Activity: Not on file  Stress: Not on file  Social Connections: Not on file  Intimate Partner Violence: Not on file    Allergies  Allergen Reactions   Synthroid [Levothyroxine Sodium]     Pt stated not sure what happens when she takes this    Family History  Problem Relation Age of Onset    Pneumonia Mother 109   Alzheimer's disease Father    Leukemia Brother    Alcohol abuse Brother     Prior to Admission medications   Medication Sig Start Date End Date Taking? Authorizing Provider  acetaminophen (TYLENOL) 500 MG tablet Take 500 mg by mouth every 6 (six) hours as needed for mild pain.    [provider]  amLODipine (NORVASC) 10 MG tablet Take 10 mg by mouth daily. 03/19/19   [provider]  aspirin 81 MG tablet Take 81 mg by mouth daily.      [provider]  donepezil (ARICEPT) 10 MG tablet Take 10 mg by mouth at bedtime.  01/11/15   [provider]  hydrochlorothiazide (HYDRODIURIL) 25 MG tablet Take 25 mg by mouth daily. 03/19/19   [provider]  ibuprofen (ADVIL,MOTRIN) 400 MG tablet Take 1 tablet (400 mg total) by mouth every 6 (six) hours as needed for mild pain or moderate pain. 09/29/17   Shahmehdi, Valeria Batman, MD  methocarbamol (ROBAXIN) 500 MG tablet Take 1 tablet by mouth as needed. For spasms/muscle tension 05/12/19   [provider]  olmesartan (BENICAR) 40 MG tablet Take 40 mg by mouth daily.    [provider]  Vitamin D, Ergocalciferol, (DRISDOL) 50000 units CAPS capsule Take 50,000 Units by mouth every 7 (seven) days. On Sundays    [provider]    Physical Exam: Vitals:   09/10/20 1415 09/10/20 1445 09/10/20 1500 09/10/20 1515  BP: (!) 176/81 (!) 156/81 (!) 163/61 (!) 113/97  Pulse: 75 78 76 78  Resp: (!) 6 (!) '22 15 18  '$ Temp:    98 F (36.7 C)  SpO2: 96% 94% 95% 93%  Weight:      Height:         General:  Appears calm and comfortable and is in NAD. Does not appear stated age.  Eyes:  PERRL, EOMI, normal lids, iris ENT:  hard of hearing, lips & tongue, mmm; appropriate dentition Neck:  no LAD, masses or thyromegaly; no carotid bruits Cardiovascular:  RRR, loud systolic murmur. trace LE edema.  Respiratory:   CTA bilaterally with no wheezes/rales/rhonchi.  Normal respiratory  effort. Abdomen:  soft, NT, ND, NABS Back:   normal alignment, no CVAT Skin:  no rash or induration seen on limited exam Musculoskeletal:  grossly normal tone BUE/LLE. RLE: in splint. Can move leg up and down. No knee edema with flexion and extension. Sensation intact in toes. DP in right foot not able to be examined due to set splint, good ROM, no bony abnormality Lower extremity:  Limited foot exam with no ulcerations.  2+  distal pulses on left. Psychiatric:  grossly normal mood and affect, speech fluent and appropriate, AOx3 Neurologic:  CN 2-12 grossly intact, moves all extremities in coordinated fashion, sensation intact    Radiological Exams on Admission: Independently reviewed - see discussion in A/P where applicable  DG Ankle Complete Right  Result Date: 09/10/2020 CLINICAL DATA:  Status post reduction of right ankle fracture. EXAM: RIGHT ANKLE - COMPLETE 3+ VIEW COMPARISON:  September 10, 2020. FINDINGS: Right ankle has been casted and immobilized. There has been partial reduction of the talotibial dislocation noted on prior exam, with mild persistent lateral dislocation remaining. Displaced fractures involving the medial malleolus and distal right fibula are also noted and have been partially reduced since prior exam. IMPRESSION: Casting and immobilization of right ankle status post partial reduction of talotibial dislocation as well as displaced fractures involving medial malleolus and distal right fibula. Electronically Signed   By: Marijo Conception M.D.   On: 09/10/2020 14:39   DG Ankle Right Port  Result Date: 09/10/2020 CLINICAL DATA:  Ankle fracture.  Fall with ankle deformity. EXAM: PORTABLE RIGHT ANKLE - 2 VIEW COMPARISON:  09/18/2013 right foot radiographs FINDINGS: There are comminuted, displaced, and angulated fractures of the distal tibia and fibula. There is lateral and posterior dislocation of the talus relative to the tibia. A medial malleolus fragment remains approximated  with the talus. The lateral malleolus is displaced laterally and angulated laterally relative to the proximal fibula fragment. There is a small plantar calcaneal enthesophyte. Mild soft tissue swelling is noted at the ankle. IMPRESSION: Right ankle fracture-dislocation. Electronically Signed   By: Logan Bores M.D.   On: 09/10/2020 13:15    EKG: Independently reviewed.  NSR with rate 73 with PAC; nonspecific ST changes with no evidence of acute ischemia   Labs on Admission: I have personally reviewed the available labs and imaging studies at the time of the admission.  Pertinent labs:  WBC 13.5 (8.4-13.6) creatinine 1.18-->1.0 (.76-1.09)   Assessment/Plan Principal Problem:   Closed right ankle fracture -87 year old female presenting with right ankle fracture will likely need delayed ORIF to allow the swelling to subside however per orthopedics may attempt during this admission -PT/ OT -Nonweightbearing -SCDs for possible surgery tomorrow. Hold ASA.  -follow ortho recommendation -Placement at rehab/SNF: social work consult ordered  Active Problems:   Hypertension -continue norvasc 10 mg daily, hydrochlorothiazide 25 mg daily and Benicar 40 mg daily    Leucocytosis -History of leukocytosis and in her baseline range.  Do Not think she has any source of infection at this time -also could have a reactive component due to recent stress of ankle fracture  -Trend and continue to monitor    CKD (chronic kidney disease), stage II vs AKI -Baseline creatinine 0.76-1.09 -Does not have a diagnosis of chronic kidney disease in her problem list that I can find however has had mildly elevated creatinine for the past few years that fluctuates between normal to mildly elevated -Continue to monitor -Intake and output    Chronic diastolic CHF (congestive heart failure) ([place]) -Appears euvolemic with no signs of exacerbation -Echo June 2020: EF greater than 65%.  Diastolic Doppler parameters are  consistent with impaired reelection.  Normal right ventricular function. -Continue ACE inhibitor -monitor intake and output    Aortic stenosis s/p AVR in 2007 -Echo in June/2020 showed AV prosthesis functioning well.    PVC's (premature ventricular contractions) -Stable keep her on telemetry for 24 hours and reassess if needed  Hyperlipidemia On no statin therapy which is quite reasonable for a 87 year old -No current lipid panel can follow outpatient with PCP  Memory loss Mild, continue aricept  Body mass index is 27.9 kg/m.   Level of care: Telemetry Medical DVT prophylaxis:  SCDs for possible OR tomorrow.  Code Status:  Full - confirmed with patient Family Communication: daughter at bedside: [name] Disposition Plan:  The patient is from: home  Anticipated d/c is to: rehab/SNF Requires inpatient hospitalization and is at significant risk of l worsening, requires constant monitoring, assessment and possible surgical intervention and MDM with specialists.  Consults called: othopedics  Admission status:  inpatient    Orma Flaming MD [facility]   How to contact the [facility] Attending or Consulting provider [place] or covering provider during after hours [place], for this patient?  Check the care team in [facility] and look for a) attending/consulting [facility] provider listed and b) the [facility] team listed Log into [email or web address] and use [place] universal password to access. If you do not have the password, please contact the hospital operator. Locate the [facility] provider you are looking for under [facility] and page to a number that you can be directly reached. If you still have difficulty reaching the provider, please page the [facility] (Director on Call) for the Hospitalists listed on amion for assistance.   09/10/2020, 3:29 PM

## 2020-09-10 NOTE — H&P (View-Only) (Signed)
Reason for Consult:Right ankle fx Referring Physician: Fredia Sorrow Time called: [id number] Time at bedside: Megan Pitts is an 87 y.o. female.  HPI: Megan Pitts was walking and misstepped and fell, twisting her ankle. She had immediate pain and could not get up or bear weight. She was brought to the ED where x-rays showed an ankle fx and orthopedic surgery was consulted. She lives at home alone and usually ambulates with the aid of a RW.  Past Medical History:  Diagnosis Date   A-fib [facility])    Aortic stenosis    s/p AVR 07/2005(porcine)   Carotid bruit 09/2008   <50% B   GERD (gastroesophageal reflux disease)    Gilbert's syndrome    Hyperlipidemia    Hypertension    IFG (impaired fasting glucose)    OAB (overactive bladder)    Osteopenia    Phlebitis    UPPER EXTREMITY   PVC's (premature ventricular contractions)    Shingles    Varicose veins    s/p treatment    Vertigo     Past Surgical History:  Procedure Laterality Date   ABDOMINAL HYSTERECTOMY  1963   partial   AORTIC VALVE REPLACEMENT  08/01/2005   WITH A #23MM TORONTO STENTLESS PORCINE AORTIC VALVE   CARDIAC CATHETERIZATION  07/25/2005   EF 60% THE AORTIC VALVE IS HEAVILY CALCIFIED WITH REDUCED OPENING. NO MVP   FOOT SURGERY     HAND SURGERY     HEMORRHOID SURGERY  1980's   US ECHOCARDIOGRAPHY  04/14/2008   EF 55-60%. NORMAL   US ECHOCARDIOGRAPHY  11/21/2005   EF 55-60%   US ECHOCARDIOGRAPHY  07/20/2005   EF 55-60%   US ECHOCARDIOGRAPHY  10/19/2004   EF 55-60%   VARICOSE VEIN SURGERY  2010   laser treatment    Family History  Problem Relation Age of Onset   Pneumonia Mother 74   Alzheimer's disease Father    Leukemia Brother    Alcohol abuse Brother     Social History:  reports that she has never smoked. She has never used smokeless tobacco. She reports that she does not drink alcohol and does not use drugs.  Allergies:  Allergies  Allergen Reactions   Synthroid [Levothyroxine Sodium]     Pt stated not  sure what happens when she takes this    Medications: I have reviewed the patient's current medications.  No results found for this or any previous visit (from the past 48 hour(s)).  DG Ankle Right Port  Result Date: 09/10/2020 CLINICAL DATA:  Ankle fracture.  Fall with ankle deformity. EXAM: PORTABLE RIGHT ANKLE - 2 VIEW COMPARISON:  09/18/2013 right foot radiographs FINDINGS: There are comminuted, displaced, and angulated fractures of the distal tibia and fibula. There is lateral and posterior dislocation of the talus relative to the tibia. A medial malleolus fragment remains approximated with the talus. The lateral malleolus is displaced laterally and angulated laterally relative to the proximal fibula fragment. There is a small plantar calcaneal enthesophyte. Mild soft tissue swelling is noted at the ankle. IMPRESSION: Right ankle fracture-dislocation. Electronically Signed   By: Logan Bores M.D.   On: 09/10/2020 13:15    Review of Systems  HENT:  Negative for ear discharge, ear pain, hearing loss and tinnitus.   Eyes:  Negative for photophobia and pain.  Respiratory:  Negative for cough and shortness of breath.   Cardiovascular:  Negative for chest pain.  Gastrointestinal:  Negative for abdominal pain, nausea and vomiting.  Genitourinary:  Negative for dysuria, flank pain, frequency and urgency.  Musculoskeletal:  Positive for arthralgias (Right ankle). Negative for back pain, myalgias and neck pain.  Neurological:  Negative for dizziness and headaches.  Hematological:  Does not bruise/bleed easily.  Psychiatric/Behavioral:  The patient is not nervous/anxious.   Blood pressure 137/68, pulse 69, temperature 97.6 F (36.4 C), resp. rate (!) 21, height 5' 3.5" (1.613 m), weight 72.6 kg, SpO2 98 %. Physical Exam Constitutional:      General: She is not in acute distress.    Appearance: She is well-developed. She is not diaphoretic.  HENT:     Head: Normocephalic and atraumatic.  Eyes:      General: No scleral icterus.       Right eye: No discharge.        Left eye: No discharge.     Conjunctiva/sclera: Conjunctivae normal.  Cardiovascular:     Rate and Rhythm: Normal rate and regular rhythm.  Pulmonary:     Effort: Pulmonary effort is normal. No respiratory distress.  Musculoskeletal:     Cervical back: Normal range of motion.     Comments: RLE No traumatic wounds, ecchymosis, or rash  Ankle deformity, severe TTP, mild edema  No knee effusion  Knee stable to varus/ valgus and anterior/posterior stress  Sens SPN, TN intact, DPN paresthetic  Motor EHL 5/5  DP 2+, PT 0, No significant edema  Skin:    General: Skin is warm and dry.  Neurological:     Mental Status: She is alert.  Psychiatric:        Mood and Affect: Mood normal.        Behavior: Behavior normal.    Assessment/Plan: Right ankle fx -- EDP/PA to reduce ankle and splint. Will likely need delayed ORIF to allow swelling to subside but may attempt during this admission as it sounds like she'll need admission for PT/OT and possibly placement. NWB.    Lisette Abu, PA-C Orthopedic Surgery [phone] 09/10/2020, 1:25 PM

## 2020-09-10 NOTE — ED Provider Notes (Signed)
I provided a substantive portion of the care of this patient.  I personally performed the entirety of the history, exam, and medical decision making for this encounter.  EKG Interpretation  Date/Time:  Friday September 10 2020 12:49:51 EDT Ventricular Rate:  73 PR Interval:  186 QRS Duration: 120 QT Interval:  438 QTC Calculation: 483 R Axis:   118 Text Interpretation: Sinus rhythm Atrial premature complex Consider left atrial enlargement Nonspecific intraventricular conduction delay Confirmed by Fredia Sorrow [phone]) on 09/10/2020 1:02:08 PM   Patient seen by me along with physician assistant.  Patient overturned her ankle she lives by herself.  She did not fall.  However couple days ago she did have a fall and hit her head.  Patient will be admitted and will get head CT prior to admission.  Patient brought in by EMS with obvious ankle deformity to the right ankle.  X-ray showed right ankle fracture dislocation.  Patient had good cap refill to her toes.  Sensation was intact.  Only other injury related to twisting her ankle she apparently sat down but she does have a skin tear to her right elbow.  Patient was prepped and prepared for conscious sedation with etomidate.  And physician assistant did the ankle reduction and orthopedic tech applied a posterior and stirrup splint.  Patient not going to be a candidate for going home because she lives by herself.  Will probably require a medicine admission.  Patient followed by Rosanne Gutting before.  .Sedation  Date/Time: 09/10/2020 1:44 PM Performed by: Fredia Sorrow, MD Authorized by: Fredia Sorrow, MD   Consent:    Consent obtained:  Written   Consent given by:  Patient   Risks discussed:  Allergic reaction, prolonged hypoxia resulting in organ damage, respiratory compromise necessitating ventilatory assistance and intubation, inadequate sedation, nausea, vomiting and dysrhythmia   Alternatives discussed:  Analgesia without  sedation Universal protocol:    Procedure explained and questions answered to patient or proxy's satisfaction: yes     Relevant documents present and verified: yes     Test results available: yes     Imaging studies available: yes     Required blood products, implants, devices, and special equipment available: yes     Site/side marked: no     Immediately prior to procedure, a time out was called: yes     Patient identity confirmed:  Verbally with patient Indications:    Procedure performed:  Fracture reduction Pre-sedation assessment:    Time since last food or drink:  5   ASA classification: class 3 - patient with severe systemic disease     Mouth opening:  3 or more finger widths   Mallampati score:  II - soft palate, uvula, fauces visible   Neck mobility: normal     Pre-sedation assessments completed and reviewed: airway patency, cardiovascular function, mental status, nausea/vomiting, pain level and respiratory function     Pre-sedation assessment completed:  09/10/2020 1:46 PM Immediate pre-procedure details:    Reassessment: Patient reassessed immediately prior to procedure     Reviewed: vital signs     Verified: bag valve mask available, emergency equipment available, intubation equipment available, IV patency confirmed, oxygen available and suction available   Procedure details (see MAR for exact dosages):    Preoxygenation:  Nasal cannula   Sedation:  Etomidate   Intended level of sedation: deep   Analgesia:  Fentanyl   Intra-procedure monitoring:  Continuous capnometry, cardiac monitor, continuous pulse oximetry and frequent vital sign checks  Intra-procedure events: none     Total Provider sedation time (minutes):  30 Post-procedure details:    Attendance: Constant attendance by certified staff until patient recovered     Recovery: Patient returned to pre-procedure baseline     Post-sedation assessments completed and reviewed: airway patency, cardiovascular function,  mental status and respiratory function     Post-sedation assessments completed and reviewed: post-procedure nausea and vomiting status not reviewed and pain score not reviewed     Patient is stable for discharge or admission: yes     Procedure completion:  Tolerated well, no immediate complications    Fredia Sorrow, MD 09/10/20 1350

## 2020-09-11 ENCOUNTER — Inpatient Hospital Stay (HOSPITAL_COMMUNITY): Payer: Medicare Other | Admitting: Certified Registered Nurse Anesthetist

## 2020-09-11 ENCOUNTER — Encounter (HOSPITAL_COMMUNITY): Payer: Self-pay | Admitting: Family Medicine

## 2020-09-11 ENCOUNTER — Encounter (HOSPITAL_COMMUNITY)
Admission: EM | Disposition: A | Discharge status: Skilled Nursing Facility | Payer: Self-pay | Source: Home / Self Care | Attending: Family Medicine

## 2020-09-11 ENCOUNTER — Inpatient Hospital Stay (HOSPITAL_COMMUNITY): Payer: Medicare Other

## 2020-09-11 DIAGNOSIS — D72829 Elevated white blood cell count, unspecified: Secondary | ICD-10-CM

## 2020-09-11 DIAGNOSIS — S82891A Other fracture of right lower leg, initial encounter for closed fracture: Secondary | ICD-10-CM | POA: Diagnosis not present

## 2020-09-11 DIAGNOSIS — E785 Hyperlipidemia, unspecified: Secondary | ICD-10-CM

## 2020-09-11 DIAGNOSIS — I350 Nonrheumatic aortic (valve) stenosis: Secondary | ICD-10-CM

## 2020-09-11 DIAGNOSIS — I5032 Chronic diastolic (congestive) heart failure: Secondary | ICD-10-CM | POA: Diagnosis not present

## 2020-09-11 HISTORY — PX: ORIF ANKLE FRACTURE: SHX5408

## 2020-09-11 LAB — CBC
HCT: 38.9 % (ref 36.0–46.0)
Hemoglobin: 13.2 g/dL (ref 12.0–15.0)
MCH: 30.1 pg (ref 26.0–34.0)
MCHC: 33.9 g/dL (ref 30.0–36.0)
MCV: 88.6 fL (ref 80.0–100.0)
Platelets: 350 10*3/uL (ref 150–400)
RBC: 4.39 MIL/uL (ref 3.87–5.11)
RDW: 12.8 % (ref 11.5–15.5)
WBC: 12.6 10*3/uL — ABNORMAL HIGH (ref 4.0–10.5)
nRBC: 0 % (ref 0.0–0.2)

## 2020-09-11 LAB — BASIC METABOLIC PANEL
Anion gap: 7 (ref 5–15)
BUN: 18 mg/dL (ref 8–23)
CO2: 27 mmol/L (ref 22–32)
Calcium: 8.7 mg/dL — ABNORMAL LOW (ref 8.9–10.3)
Chloride: 101 mmol/L (ref 98–111)
Creatinine, Ser: 1.03 mg/dL — ABNORMAL HIGH (ref 0.44–1.00)
GFR, Estimated: 52 mL/min — ABNORMAL LOW (ref >60)
Glucose, Bld: 101 mg/dL — ABNORMAL HIGH (ref 70–99)
Potassium: 3.6 mmol/L (ref 3.5–5.1)
Sodium: 135 mmol/L (ref 135–145)

## 2020-09-11 LAB — SURGICAL PCR SCREEN
MRSA, PCR: NEGATIVE
Staphylococcus aureus: NEGATIVE

## 2020-09-11 SURGERY — OPEN REDUCTION INTERNAL FIXATION (ORIF) ANKLE FRACTURE
Anesthesia: Monitor Anesthesia Care | Site: Ankle | Laterality: Right

## 2020-09-11 MED ORDER — HYDROCODONE-ACETAMINOPHEN 7.5-325 MG PO TABS
1.0000 | ORAL_TABLET | ORAL | Status: DC | PRN
Start: 2020-09-11 — End: 2020-09-16
  Administered 2020-09-12 – 2020-09-13 (×5): 1 via ORAL
  Filled 2020-09-11 (×5): qty 1

## 2020-09-11 MED ORDER — DIPHENHYDRAMINE HCL 12.5 MG/5ML PO ELIX: 12.5000 mg | ORAL_SOLUTION | ORAL | Status: DC | PRN

## 2020-09-11 MED ORDER — VANCOMYCIN HCL 1000 MG IV SOLR
INTRAVENOUS | Status: AC
  Filled 2020-09-11: qty 20

## 2020-09-11 MED ORDER — ENOXAPARIN SODIUM 30 MG/0.3ML IJ SOSY
30.0000 mg | PREFILLED_SYRINGE | INTRAMUSCULAR | Status: DC
  Administered 2020-09-12 – 2020-09-13 (×2): 30 mg via SUBCUTANEOUS
  Filled 2020-09-11 (×2): qty 0.3

## 2020-09-11 MED ORDER — OXYCODONE HCL 5 MG PO TABS: 5.0000 mg | ORAL_TABLET | Freq: Once | ORAL | Status: DC | PRN

## 2020-09-11 MED ORDER — ONDANSETRON HCL 4 MG/2ML IJ SOLN
INTRAMUSCULAR | Status: DC | PRN
  Administered 2020-09-11: 4 mg via INTRAVENOUS

## 2020-09-11 MED ORDER — FENTANYL CITRATE (PF) 100 MCG/2ML IJ SOLN: 25.0000 ug | INTRAMUSCULAR | Status: DC | PRN

## 2020-09-11 MED ORDER — CHLORHEXIDINE GLUCONATE 0.12 % MT SOLN
OROMUCOSAL | Status: AC
  Administered 2020-09-11: 15 mL via OROMUCOSAL
  Filled 2020-09-11: qty 15

## 2020-09-11 MED ORDER — LIDOCAINE-EPINEPHRINE 2 %-1:100000 IJ SOLN
INTRAMUSCULAR | Status: DC | PRN
  Administered 2020-09-11: 5 mL via PERINEURAL
  Administered 2020-09-11: 10 mL via PERINEURAL

## 2020-09-11 MED ORDER — CHLORHEXIDINE GLUCONATE 4 % EX LIQD
60.0000 mL | Freq: Once | CUTANEOUS | Status: AC
  Administered 2020-09-11: 4 via TOPICAL
  Filled 2020-09-11: qty 60

## 2020-09-11 MED ORDER — VANCOMYCIN HCL 1000 MG IV SOLR
INTRAVENOUS | Status: DC | PRN
  Administered 2020-09-11: 1000 mg

## 2020-09-11 MED ORDER — BUPIVACAINE HCL (PF) 0.25 % IJ SOLN
INTRAMUSCULAR | Status: AC
  Filled 2020-09-11: qty 30

## 2020-09-11 MED ORDER — MIDAZOLAM HCL 2 MG/2ML IJ SOLN
INTRAMUSCULAR | Status: DC | PRN
  Administered 2020-09-11: 1 mg via INTRAVENOUS

## 2020-09-11 MED ORDER — DEXAMETHASONE SODIUM PHOSPHATE 10 MG/ML IJ SOLN
INTRAMUSCULAR | Status: AC
  Filled 2020-09-11: qty 2

## 2020-09-11 MED ORDER — ONDANSETRON HCL 4 MG/2ML IJ SOLN
INTRAMUSCULAR | Status: AC
  Filled 2020-09-11: qty 4

## 2020-09-11 MED ORDER — LIDOCAINE 2% (20 MG/ML) 5 ML SYRINGE
INTRAMUSCULAR | Status: AC
  Filled 2020-09-11: qty 10

## 2020-09-11 MED ORDER — ENSURE PRE-SURGERY PO LIQD
296.0000 mL | Freq: Once | ORAL | Status: AC
  Administered 2020-09-11: 296 mL via ORAL
  Filled 2020-09-11: qty 296

## 2020-09-11 MED ORDER — ONDANSETRON HCL 4 MG/2ML IJ SOLN: 4.0000 mg | Freq: Four times a day (QID) | INTRAMUSCULAR | Status: DC | PRN

## 2020-09-11 MED ORDER — ACETAMINOPHEN 160 MG/5ML PO SOLN: 1000.0000 mg | Freq: Once | ORAL | Status: DC | PRN

## 2020-09-11 MED ORDER — ORAL CARE MOUTH RINSE: 15.0000 mL | Freq: Once | OROMUCOSAL | Status: AC

## 2020-09-11 MED ORDER — LACTATED RINGERS IV SOLN: INTRAVENOUS | Status: DC

## 2020-09-11 MED ORDER — PHENYLEPHRINE 40 MCG/ML (10ML) SYRINGE FOR IV PUSH (FOR BLOOD PRESSURE SUPPORT)
PREFILLED_SYRINGE | INTRAVENOUS | Status: AC
  Filled 2020-09-11: qty 20

## 2020-09-11 MED ORDER — FENTANYL CITRATE (PF) 250 MCG/5ML IJ SOLN
INTRAMUSCULAR | Status: AC
  Filled 2020-09-11: qty 5

## 2020-09-11 MED ORDER — POVIDONE-IODINE 10 % EX SWAB
2.0000 "application " | Freq: Once | CUTANEOUS | Status: AC
  Administered 2020-09-11: 2 via TOPICAL

## 2020-09-11 MED ORDER — 0.9 % SODIUM CHLORIDE (POUR BTL) OPTIME
TOPICAL | Status: DC | PRN
  Administered 2020-09-11: 1000 mL

## 2020-09-11 MED ORDER — CHLORHEXIDINE GLUCONATE 0.12 % MT SOLN: 15.0000 mL | Freq: Once | OROMUCOSAL | Status: AC

## 2020-09-11 MED ORDER — CEFAZOLIN SODIUM-DEXTROSE 1-4 GM/50ML-% IV SOLN
1.0000 g | Freq: Three times a day (TID) | INTRAVENOUS | Status: AC
  Administered 2020-09-11 – 2020-09-12 (×3): 1 g via INTRAVENOUS
  Filled 2020-09-11 (×3): qty 50

## 2020-09-11 MED ORDER — ROCURONIUM BROMIDE 10 MG/ML (PF) SYRINGE
PREFILLED_SYRINGE | INTRAVENOUS | Status: AC
  Filled 2020-09-11: qty 60

## 2020-09-11 MED ORDER — OXYCODONE HCL 5 MG/5ML PO SOLN
5.0000 mg | Freq: Once | ORAL | Status: DC | PRN
Start: 2020-09-11 — End: 2020-09-11

## 2020-09-11 MED ORDER — CEFAZOLIN SODIUM-DEXTROSE 2-4 GM/100ML-% IV SOLN
2.0000 g | INTRAVENOUS | Status: AC
  Administered 2020-09-11: 2 g via INTRAVENOUS
  Filled 2020-09-11: qty 100

## 2020-09-11 MED ORDER — MIDAZOLAM HCL 2 MG/2ML IJ SOLN
INTRAMUSCULAR | Status: AC
  Filled 2020-09-11: qty 2

## 2020-09-11 MED ORDER — DOCUSATE SODIUM 100 MG PO CAPS
100.0000 mg | ORAL_CAPSULE | Freq: Two times a day (BID) | ORAL | Status: DC
  Administered 2020-09-11 – 2020-09-15 (×8): 100 mg via ORAL
  Filled 2020-09-11 (×8): qty 1

## 2020-09-11 MED ORDER — ONDANSETRON HCL 4 MG PO TABS: 4.0000 mg | ORAL_TABLET | Freq: Four times a day (QID) | ORAL | Status: DC | PRN

## 2020-09-11 MED ORDER — ACETAMINOPHEN 500 MG PO TABS: 1000.0000 mg | ORAL_TABLET | Freq: Once | ORAL | Status: DC | PRN

## 2020-09-11 MED ORDER — MORPHINE SULFATE (PF) 2 MG/ML IV SOLN
0.5000 mg | INTRAVENOUS | Status: DC | PRN
  Administered 2020-09-15: 1 mg via INTRAVENOUS
  Filled 2020-09-11: qty 1

## 2020-09-11 MED ORDER — PHENYLEPHRINE HCL-NACL 20-0.9 MG/250ML-% IV SOLN
INTRAVENOUS | Status: DC | PRN
  Administered 2020-09-11: 25 ug/min via INTRAVENOUS

## 2020-09-11 MED ORDER — BUPIVACAINE-EPINEPHRINE (PF) 0.5% -1:200000 IJ SOLN
INTRAMUSCULAR | Status: DC | PRN
  Administered 2020-09-11: 20 mL via PERINEURAL
  Administered 2020-09-11: 10 mL via PERINEURAL

## 2020-09-11 MED ORDER — FENTANYL CITRATE (PF) 250 MCG/5ML IJ SOLN
INTRAMUSCULAR | Status: DC | PRN
  Administered 2020-09-11 (×2): 50 ug via INTRAVENOUS

## 2020-09-11 MED ORDER — PROPOFOL 500 MG/50ML IV EMUL
INTRAVENOUS | Status: DC | PRN
  Administered 2020-09-11: 50 ug/kg/min via INTRAVENOUS

## 2020-09-11 MED ORDER — ACETAMINOPHEN 10 MG/ML IV SOLN: 1000.0000 mg | Freq: Once | INTRAVENOUS | Status: DC | PRN

## 2020-09-11 MED ORDER — HYDROCODONE-ACETAMINOPHEN 5-325 MG PO TABS
1.0000 | ORAL_TABLET | ORAL | Status: DC | PRN
  Administered 2020-09-14 – 2020-09-16 (×7): 1 via ORAL
  Filled 2020-09-11 (×8): qty 1

## 2020-09-11 MED ORDER — ACETAMINOPHEN 500 MG PO TABS
500.0000 mg | ORAL_TABLET | Freq: Four times a day (QID) | ORAL | Status: AC
  Administered 2020-09-11 – 2020-09-12 (×4): 500 mg via ORAL
  Filled 2020-09-11 (×3): qty 1

## 2020-09-11 SURGICAL SUPPLY — 80 items
APL SKNCLS STERI-STRIP NONHPOA (GAUZE/BANDAGES/DRESSINGS)
BAG COUNTER SPONGE SURGICOUNT (BAG) ×1 IMPLANT
BAG SPNG CNTER NS LX DISP (BAG) ×1
BENZOIN TINCTURE PRP APPL 2/3 (GAUZE/BANDAGES/DRESSINGS) IMPLANT
BIT DRILL 2 CANN GRADUATED (BIT) ×1 IMPLANT
BIT DRILL 2.5 CANN LNG (BIT) ×1 IMPLANT
BIT DRILL 2.5 CANN STRL (BIT) ×1 IMPLANT
BIT DRILL 2.6 CANN (BIT) ×1 IMPLANT
BIT DRILL 2.7 (BIT) ×2
BIT DRILL 2.7X2.7/3XSCR ANKL (BIT) IMPLANT
BIT DRL 2.7X2.7/3XSCR ANKL (BIT) ×1
BLADE SURG 10 STRL SS (BLADE) ×2 IMPLANT
BLADE SURG 15 STRL LF DISP TIS (BLADE) ×2 IMPLANT
BLADE SURG 15 STRL SS (BLADE) ×4
BNDG CMPR MED 15X6 ELC VLCR LF (GAUZE/BANDAGES/DRESSINGS) ×1
BNDG COHESIVE 4X5 TAN STRL (GAUZE/BANDAGES/DRESSINGS) ×2 IMPLANT
BNDG ELASTIC 4X5.8 VLCR STR LF (GAUZE/BANDAGES/DRESSINGS) ×2 IMPLANT
BNDG ELASTIC 6X15 VLCR STRL LF (GAUZE/BANDAGES/DRESSINGS) ×1 IMPLANT
BNDG ELASTIC 6X5.8 VLCR STR LF (GAUZE/BANDAGES/DRESSINGS) ×2 IMPLANT
CUFF TOURN SGL QUICK 34 (TOURNIQUET CUFF)
CUFF TRNQT CYL 34X4.125X (TOURNIQUET CUFF) IMPLANT
DECANTER SPIKE VIAL GLASS SM (MISCELLANEOUS) IMPLANT
DRAPE INCISE IOBAN 66X45 STRL (DRAPES) ×2 IMPLANT
DRAPE OEC MINIVIEW 54X84 (DRAPES) ×2 IMPLANT
DRAPE U-SHAPE 47X51 STRL (DRAPES) ×2 IMPLANT
DRSG PAD ABDOMINAL 8X10 ST (GAUZE/BANDAGES/DRESSINGS) ×3 IMPLANT
DURAPREP 26ML APPLICATOR (WOUND CARE) ×2 IMPLANT
ELECT REM PT RETURN 9FT ADLT (ELECTROSURGICAL) ×2
ELECTRODE REM PT RTRN 9FT ADLT (ELECTROSURGICAL) ×1 IMPLANT
GAUZE SPONGE 4X4 12PLY STRL (GAUZE/BANDAGES/DRESSINGS) ×2 IMPLANT
GAUZE SPONGE 4X4 12PLY STRL LF (GAUZE/BANDAGES/DRESSINGS) ×1 IMPLANT
GLOVE SRG 8 PF TXTR STRL LF DI (GLOVE) ×1 IMPLANT
GLOVE SURG ENC MOIS LTX SZ6.5 (GLOVE) ×2 IMPLANT
GLOVE SURG ENC MOIS LTX SZ8 (GLOVE) ×2 IMPLANT
GLOVE SURG LTX SZ8 (GLOVE) ×2 IMPLANT
GLOVE SURG UNDER LTX SZ6.5 (GLOVE) ×2 IMPLANT
GLOVE SURG UNDER POLY LF SZ8 (GLOVE) ×2
GOWN STRL REUS W/ TWL LRG LVL3 (GOWN DISPOSABLE) ×1 IMPLANT
GOWN STRL REUS W/ TWL XL LVL3 (GOWN DISPOSABLE) ×1 IMPLANT
GOWN STRL REUS W/TWL LRG LVL3 (GOWN DISPOSABLE) ×2
GOWN STRL REUS W/TWL XL LVL3 (GOWN DISPOSABLE) ×2
GUIDEWIRE 1.35MM (WIRE) ×1 IMPLANT
KIT BASIN OR (CUSTOM PROCEDURE TRAY) ×2 IMPLANT
NEEDLE HYPO 22GX1.5 SAFETY (NEEDLE) IMPLANT
NS IRRIG 1000ML POUR BTL (IV SOLUTION) ×3 IMPLANT
PACK ORTHO EXTREMITY (CUSTOM PROCEDURE TRAY) ×2 IMPLANT
PAD CAST 4YDX4 CTTN HI CHSV (CAST SUPPLIES) ×1 IMPLANT
PADDING CAST COTTON 4X4 STRL (CAST SUPPLIES) ×4
PADDING CAST COTTON 6X4 STRL (CAST SUPPLIES) ×2 IMPLANT
PLATE DST LCK RT H4 (Plate) ×1 IMPLANT
SCREW COMP KREULOCK 2.7X12 (Screw) ×1 IMPLANT
SCREW COMP KREULOCK 2.7X14 (Screw) ×2 IMPLANT
SCREW COMP KREULOCK 2.7X16 (Screw) ×2 IMPLANT
SCREW CORT 3.5X40 LP ANKLE (Screw) ×1 IMPLANT
SCREW LO PRO 2.7X22MM CORTEX (Screw) ×1 IMPLANT
SCREW LOCK T15 FT 14X3.5XST (Screw) IMPLANT
SCREW LOCKING 3.5X14MM (Screw) ×2 IMPLANT
SCREW LOW PROFILE 3.5X14 (Screw) ×1 IMPLANT
SCREW LP CANN 4.0X45MM (Screw) ×2 IMPLANT
SLEEVE SCD COMPRESS KNEE MED (STOCKING) IMPLANT
SPLINT PLASTER CAST XFAST 5X30 (CAST SUPPLIES) IMPLANT
SPLINT PLASTER XFAST SET 5X30 (CAST SUPPLIES) ×20
STRIP CLOSURE SKIN 1/2X4 (GAUZE/BANDAGES/DRESSINGS) ×2 IMPLANT
SUCTION FRAZIER HANDLE 10FR (MISCELLANEOUS) ×2
SUCTION TUBE FRAZIER 10FR DISP (MISCELLANEOUS) ×1 IMPLANT
SUT ETHILON 2 0 FS 18 (SUTURE) ×3 IMPLANT
SUT MNCRL AB 4-0 PS2 18 (SUTURE) IMPLANT
SUT MON AB 3-0 SH 27 (SUTURE)
SUT MON AB 3-0 SH27 (SUTURE) IMPLANT
SUT VIC AB 0 CT1 27 (SUTURE) ×2
SUT VIC AB 0 CT1 27XBRD ANBCTR (SUTURE) ×1 IMPLANT
SUT VIC AB 3-0 SH 27 (SUTURE) ×2
SUT VIC AB 3-0 SH 27X BRD (SUTURE) ×1 IMPLANT
SYR CONTROL 10ML LL (SYRINGE) IMPLANT
TOWEL GREEN STERILE FF (TOWEL DISPOSABLE) ×4 IMPLANT
TUBE CONNECTING 20X1/4 (TUBING) ×2 IMPLANT
UNDERPAD 30X36 HEAVY ABSORB (UNDERPADS AND DIAPERS) ×2 IMPLANT
WASHER (Orthopedic Implant) ×2 IMPLANT
WASHER ORTHO 7X (Orthopedic Implant) IMPLANT
YANKAUER SUCT BULB TIP NO VENT (SUCTIONS) ×2 IMPLANT

## 2020-09-11 NOTE — Interval H&P Note (Signed)
Discussed ex fix vs orif, all questions answered.

## 2020-09-11 NOTE — Transfer of Care (Signed)
Immediate Anesthesia Transfer of Care Note  Patient: Megan Pitts  Procedure(s) Performed: OPEN REDUCTION INTERNAL FIXATION (ORIF) ANKLE FRACTURE VS EXTERNAL FIXATION (Right: Ankle)  Patient Location: PACU  Anesthesia Type:MAC and Regional  Level of Consciousness: awake  Airway & Oxygen Therapy: Patient Spontanous Breathing  Post-op Assessment: Report given to RN and Post -op Vital signs reviewed and stable  Post vital signs: Reviewed and stable  Last Vitals:  Vitals Value Taken Time  BP 150/59 09/11/20 1504  Temp    Pulse 76 09/11/20 1506  Resp 14 09/11/20 1506  SpO2 95 % 09/11/20 1506  Vitals shown include unvalidated device data.  Last Pain:  Vitals:   09/11/20 1228  TempSrc: Oral  PainSc: 0-No pain      Patients Stated Pain Goal: 1 (79/81/02 5486)  Complications: No notable events documented.

## 2020-09-11 NOTE — Anesthesia Preprocedure Evaluation (Signed)
Anesthesia Evaluation  Patient identified by MRN, date of birth, ID band Patient awake    Reviewed: Allergy & Precautions, NPO status , Patient's Chart, lab work & pertinent test results  History of Anesthesia Complications Negative for: history of anesthetic complications  Airway Mallampati: III  TM Distance: >3 FB Neck ROM: Full    Dental  (+) Dental Advisory Given, Teeth Intact   Pulmonary neg pulmonary ROS, neg shortness of breath, neg sleep apnea, neg COPD, neg recent URI,  Covid-19 Nucleic Acid Test Results Lab Results      Component                Value               Date                      SARSCOV2NAA              NEGATIVE            09/10/2020                [place]              NEGATIVE            06/05/2019                [place]              NEGATIVE            06/03/2019              breath sounds clear to auscultation       Cardiovascular hypertension, Pt. on medications +CHF   Rhythm:Regular  1. The left ventricle has hyperdynamic systolic function, with an  ejection fraction of >65%. The cavity size was normal. Left ventricular  diastolic Doppler parameters are consistent with impaired relaxation.  2. The right ventricle has normal systolic function. The cavity was  normal. There is no increase in right ventricular wall thickness.  3. Aortic valve regurgitation is trivial by color flow Doppler. No  stenosis of the aortic valve.  4. The aortic root and ascending aorta are normal in size and structure.  5. The interatrial septum was not assessed.    Neuro/Psych negative neurological ROS  negative psych ROS   GI/Hepatic Neg liver ROS, GERD  Controlled,  Endo/Other  negative endocrine ROS  Renal/GU Renal InsufficiencyRenal diseaseLab Results      Component                Value               Date                      CREATININE               1.03 (H)            09/11/2020                 Musculoskeletal Right ankle fracture   Abdominal   Peds  Hematology negative hematology ROS (+) Lab Results      Component                Value               Date  WBC                      12.6 (H)            09/11/2020                HGB                      13.2                09/11/2020                HCT                      38.9                09/11/2020                MCV                      88.6                09/11/2020                PLT                      350                 09/11/2020              Anesthesia Other Findings   Reproductive/Obstetrics                             Anesthesia Physical Anesthesia Plan  ASA: 2  Anesthesia Plan: MAC and Regional   Post-op Pain Management:    Induction: Intravenous  PONV Risk Score and Plan: 2 and Propofol infusion and Treatment may vary due to age or medical condition  Airway Management Planned: Nasal Cannula  Additional Equipment: None  Intra-op Plan:   Post-operative Plan:   Informed Consent: I have reviewed the patients History and Physical, chart, labs and discussed the procedure including the risks, benefits and alternatives for the proposed anesthesia with the patient or authorized representative who has indicated his/her understanding and acceptance.     Dental advisory given  Plan Discussed with: CRNA, Anesthesiologist and Surgeon  Anesthesia Plan Comments:         Anesthesia Quick Evaluation

## 2020-09-11 NOTE — Progress Notes (Signed)
OT Cancellation Note  Patient Details Name: Megan Pitts MRN: [id number] DOB: 10/16/1927   Cancelled Treatment:    Reason Eval/Treat Not Completed: Patient at procedure or test/ unavailable. Pt off unit for R ankle ORIF. OT will follow up as schedule allows to complete Evaluation post operatively, with new weight bearing orders.   Zakyia Gagan H., OTR/L Acute Rehabilitation  Elad Macphail Elane Yolanda Bonine 09/11/2020, 2:46 PM

## 2020-09-11 NOTE — Progress Notes (Signed)
Off unit for surgery

## 2020-09-11 NOTE — Progress Notes (Signed)
PROGRESS NOTE    Megan Pitts  [id number] DOB: [birth year] DOA: 09/10/2020 PCP: Ginger Organ., MD   Brief Narrative: Megan Pitts is a 87 y.o. female with a history of hypertension, aortic stenosis s/p AVR, hyperlipidemia, diastolic heart failure. Patient presented after a fall, suffering a right ankle fracture. Orthopedic surgery consulted for surgical management.   Assessment & Plan:   Principal Problem:   Closed right ankle fracture Active Problems:   Aortic stenosis   Hypertension   PVC's (premature ventricular contractions)   Hyperlipidemia   Chronic diastolic CHF (congestive heart failure) (HCC)   Leucocytosis   CKD (chronic kidney disease), stage II   Right ankle fracture Secondary to misstep and fall, twisting her ankle with resultant fracture. Orthopedic surgery consulted on admission. -Continue morphine and Norco prn -Orthopedic surgery recommendations: plan for surgery today  Primary hypertension -Continue amlodipine, hydrochlorothiazide, irbesartan (substituted for olmesartan while inpatient)  Leukocytosis Likely reactive in setting of fracture.  CKD stage II Creatinine stable.  Chronic diastolic heart failure Euvolemic. Stable.  Aortic stenosis History of AVR. Not on anticoagulation.  History of PVCs Noted.   DVT prophylaxis: SCDs; per orthopedic surgery post-op Code Status:   Code Status: Full Code Family Communication: Daughter at bedside Disposition Plan: Discharge pending orthopedic surgery management and eventual PT/OT recommendations   Consultants:  Orthopedic surgery  Procedures:  None  Antimicrobials: None    Subjective: Some ankle pain is returning. No other concerns.  Objective: Vitals:   09/10/20 2035 09/10/20 2350 09/11/20 0412 09/11/20 0808  BP: (!) 143/68 (!) 154/52 (!) 176/54 (!) 168/80  Pulse: 83 74 80 69  Resp:  18 16   Temp: 98.8 F (37.1 C) 97.8 F (36.6 C) 98.1 F (36.7 C) 97.7 F (36.5 C)   TempSrc: Oral Oral Oral Oral  SpO2: 97% 96% 96% 96%  Weight:      Height:        Intake/Output Summary (Last 24 hours) at 09/11/2020 0846 Last data filed at 09/11/2020 0425 Gross per 24 hour  Intake 240 ml  Output 200 ml  Net 40 ml   Filed Weights   09/10/20 1240  Weight: 72.6 kg    Examination:  General exam: Appears calm and comfortable Respiratory system: Clear to auscultation. Respiratory effort normal. Cardiovascular system: S1 & S2 heard, RRR. No murmurs, rubs, gallops or clicks. Gastrointestinal system: Abdomen is nondistended, soft and nontender. No organomegaly or masses felt. Normal bowel sounds heard. Central nervous system: Alert and oriented. No focal neurological deficits. Musculoskeletal: Right ankle with ACE bandage wrap Skin: No cyanosis. No rashes Psychiatry: Judgement and insight appear normal. Mood & affect appropriate.     Data Reviewed: I have personally reviewed following labs and imaging studies  CBC Lab Results  Component Value Date   WBC 12.6 (H) 09/11/2020   RBC 4.39 09/11/2020   HGB 13.2 09/11/2020   HCT 38.9 09/11/2020   MCV 88.6 09/11/2020   MCH 30.1 09/11/2020   PLT 350 09/11/2020   MCHC 33.9 09/11/2020   RDW 12.8 09/11/2020   LYMPHSABS 1.4 09/10/2020   MONOABS 0.9 09/10/2020   EOSABS 0.1 09/10/2020   BASOSABS 0.1 [id number]     Last metabolic panel Lab Results  Component Value Date   NA 135 09/11/2020   K 3.6 09/11/2020   CL 101 09/11/2020   CO2 27 09/11/2020   BUN 18 09/11/2020   CREATININE 1.03 (H) 09/11/2020   GLUCOSE 101 (H) 09/11/2020   GFRNONAA 52 (  L) 09/11/2020   GFRAA 55 (L) 06/05/2019   CALCIUM 8.7 (L) 09/11/2020   PROT 6.2 (L) 06/04/2019   ALBUMIN 3.4 (L) 06/04/2019   BILITOT 1.3 (H) 06/04/2019   ALKPHOS 59 06/04/2019   AST 16 06/04/2019   ALT 19 06/04/2019   ANIONGAP 7 09/11/2020    CBG (last 3)  No results for input(s): GLUCAP in the last 72 hours.   GFR: Estimated Creatinine Clearance: 35.1 mL/min  (A) (by C-G formula based on SCr of 1.03 mg/dL (H)).  Coagulation Profile: No results for input(s): INR, PROTIME in the last 168 hours.  Recent Results (from the past 240 hour(s))  Resp Panel by RT-PCR (Flu A&B, Covid) Nasopharyngeal Swab     Status: None   Collection Time: 09/10/20  1:55 PM   Specimen: Nasopharyngeal Swab; Nasopharyngeal(NP) swabs in vial transport medium  Result Value Ref Range Status   SARS Coronavirus 2 by RT PCR NEGATIVE NEGATIVE Final    Comment: (NOTE) SARS-CoV-2 target nucleic acids are NOT DETECTED.  The SARS-CoV-2 RNA is generally detectable in upper respiratory specimens during the acute phase of infection. The lowest concentration of SARS-CoV-2 viral copies this assay can detect is 138 copies/mL. A negative result does not preclude SARS-Cov-2 infection and should not be used as the sole basis for treatment or other patient management decisions. A negative result may occur with  improper specimen collection/handling, submission of specimen other than nasopharyngeal swab, presence of viral mutation(s) within the areas targeted by this assay, and inadequate number of viral copies(<138 copies/mL). A negative result must be combined with clinical observations, patient history, and epidemiological information. The expected result is Negative.  Fact Sheet for Patients:  EntrepreneurPulse.com.au  Fact Sheet for Healthcare Providers:  IncredibleEmployment.be  This test is no t yet approved or cleared by the [place] FDA and  has been authorized for detection and/or diagnosis of SARS-CoV-2 by FDA under an Emergency Use Authorization (EUA). This EUA will remain  in effect (meaning this test can be used) for the duration of the COVID-19 declaration under Section 564(b)(1) of the Act, 21 U.S.C.section 360bbb-3(b)(1), unless the authorization is terminated  or revoked sooner.       Influenza A by PCR NEGATIVE NEGATIVE  Final   Influenza B by PCR NEGATIVE NEGATIVE Final    Comment: (NOTE) The Xpert Xpress SARS-CoV-2/FLU/RSV plus assay is intended as an aid in the diagnosis of influenza from Nasopharyngeal swab specimens and should not be used as a sole basis for treatment. Nasal washings and aspirates are unacceptable for Xpert Xpress SARS-CoV-2/FLU/RSV testing.  Fact Sheet for Patients: EntrepreneurPulse.com.au  Fact Sheet for Healthcare Providers: IncredibleEmployment.be  This test is not yet approved or cleared by the [place] FDA and has been authorized for detection and/or diagnosis of SARS-CoV-2 by FDA under an Emergency Use Authorization (EUA). This EUA will remain in effect (meaning this test can be used) for the duration of the COVID-19 declaration under Section 564(b)(1) of the Act, 21 U.S.C. section 360bbb-3(b)(1), unless the authorization is terminated or revoked.  Performed at [facility], [place] [address], [place], [place] [address]   Surgical pcr screen     Status: None   Collection Time: 09/10/20  9:36 PM   Specimen: Nasal Mucosa; Nasal Swab  Result Value Ref Range Status   MRSA, PCR NEGATIVE NEGATIVE Final   Staphylococcus aureus NEGATIVE NEGATIVE Final    Comment: (NOTE) The Xpert SA Assay (FDA approved for NASAL specimens in patients 22 years  of age and older), is one component of a comprehensive surveillance program. It is not intended to diagnose infection nor to guide or monitor treatment. Performed at [facility], [place] [address], [place], [place] [address]         Radiology Studies: DG Ankle Complete Right  Result Date: 09/10/2020 CLINICAL DATA:  Status post reduction of right ankle fracture. EXAM: RIGHT ANKLE - COMPLETE 3+ VIEW COMPARISON:  September 10, 2020. FINDINGS: Right ankle has been casted and immobilized. There has been partial reduction of the talotibial dislocation noted on prior exam, with mild  persistent lateral dislocation remaining. Displaced fractures involving the medial malleolus and distal right fibula are also noted and have been partially reduced since prior exam. IMPRESSION: Casting and immobilization of right ankle status post partial reduction of talotibial dislocation as well as displaced fractures involving medial malleolus and distal right fibula. Electronically Signed   By: Marijo Conception M.D.   On: 09/10/2020 14:39   CT HEAD WO CONTRAST (5MM)  Result Date: 09/10/2020 CLINICAL DATA:  Head injury after fall. EXAM: CT HEAD WITHOUT CONTRAST TECHNIQUE: Contiguous axial images were obtained from the base of the skull through the vertex without intravenous contrast. COMPARISON:  September 24, 2017. FINDINGS: Brain: No evidence of acute infarction, hemorrhage, hydrocephalus, extra-axial collection or mass lesion/mass effect. Vascular: No hyperdense vessel or unexpected calcification. Skull: Normal. Negative for fracture or focal lesion. Sinuses/Orbits: No acute finding. Other: None. IMPRESSION: No acute intracranial abnormality seen. Electronically Signed   By: Marijo Conception M.D.   On: 09/10/2020 15:40   CT ANKLE RIGHT WO CONTRAST  Result Date: 09/10/2020 CLINICAL DATA:  Right ankle fracture dislocation. Fall with ankle deformity. EXAM: CT OF THE RIGHT ANKLE WITHOUT CONTRAST TECHNIQUE: Multidetector CT imaging of the right ankle was performed according to the standard protocol. Multiplanar CT image reconstructions were also generated. COMPARISON:  Radiographs 09/10/2020 FINDINGS: Bones/Joint/Cartilage The ankle is splinted. The overall alignment is improved from the original radiographs, although there is persistent lateral subluxation of the talar dome relative to the tibial plafond. Comminuted, transverse fracture through the base of the medial malleolus remains laterally displaced by up to 1.4 cm. Intra-articular fracture involving the posterior articular surface of the tibial plafond  demonstrates up to 4 mm of depression, best seen on the sagittal images. Oblique, mildly comminuted fracture of the distal fibula demonstrates up to 9 mm of posterior and lateral displacement. This fracture is also mildly angulated laterally. The talar dome and additional tarsal bones appear intact there is only a small ankle joint effusion, and no fracture fragments are seen interposed between the talar dome and tibial plafond. Ligaments Suboptimally assessed by CT. Muscles and Tendons The ankle tendons appear intact. The posterior tibialis tendon is in close proximity to the fracture of the medial malleolus, but demonstrates no entrapment. Soft tissues Mild subcutaneous edema surrounding the ankle without evidence of foreign body, focal hematoma or soft tissue emphysema. Scattered vascular calcifications are noted IMPRESSION: 1. Improved alignment of the trimalleolar fracture compared with the original radiographs. There is persistent lateral displacement of the distal fibula and medial malleolus as well as residual lateral subluxation of the talar dome relative to the tibial plafond. 2. No evidence of talar dome or other tarsal bone injury. 3. No evidence ankle tendon rupture or entrapment. Electronically Signed   By: Richardean Sale M.D.   On: 09/10/2020 17:45   DG Ankle Right Port  Result Date: 09/10/2020 CLINICAL DATA:  Ankle fracture.  Fall with ankle deformity. EXAM: PORTABLE RIGHT ANKLE - 2 VIEW COMPARISON:  09/18/2013 right foot radiographs FINDINGS: There are comminuted, displaced, and angulated fractures of the distal tibia and fibula. There is lateral and posterior dislocation of the talus relative to the tibia. A medial malleolus fragment remains approximated with the talus. The lateral malleolus is displaced laterally and angulated laterally relative to the proximal fibula fragment. There is a small plantar calcaneal enthesophyte. Mild soft tissue swelling is noted at the ankle. IMPRESSION: Right  ankle fracture-dislocation. Electronically Signed   By: Logan Bores M.D.   On: 09/10/2020 13:15        Scheduled Meds:  amLODipine  10 mg Oral Daily   donepezil  10 mg Oral QHS   etomidate  10 mg Intravenous Once   hydrochlorothiazide  25 mg Oral Daily   irbesartan  300 mg Oral Daily   sodium chloride flush  3 mL Intravenous Q12H   Continuous Infusions:  sodium chloride      ceFAZolin (ANCEF) IV       LOS: 1 day     Cordelia Poche, MD [facility] 09/11/2020, 8:46 AM  If 7PM-7AM, please contact night-coverage [email or web address]

## 2020-09-11 NOTE — Anesthesia Procedure Notes (Signed)
Anesthesia Regional Block: Adductor canal block   Pre-Anesthetic Checklist: , timeout performed,  Correct Patient, Correct Site, Correct Laterality,  Correct Procedure, Correct Position, site marked,  Risks and benefits discussed,  Surgical consent,  Pre-op evaluation,  At surgeon's request and post-op pain management  Laterality: Right and Lower  Prep: chloraprep       Needles:  Injection technique: Single-shot      Needle Length: 9cm  Needle Gauge: 22     Additional Needles: Arrow StimuQuik ECHO Echogenic Stimulating PNB Needle  Procedures:,,,, ultrasound used (permanent image in chart),,    Narrative:  Start time: 09/11/2020 1:21 PM End time: 09/11/2020 1:25 PM Injection made incrementally with aspirations every 5 mL.  Performed by: Personally  Anesthesiologist: Oleta Mouse, MD

## 2020-09-11 NOTE — Progress Notes (Signed)
Pt returned from surgery a/o x3 (no date). Repositioned for comfort and pt went to sleep.  Daughter bedside.  No feeling or movement in toes/foot yet d/t nerve block.

## 2020-09-11 NOTE — Progress Notes (Signed)
PT Cancellation Note  Patient Details Name: MOSSIE WOWK MRN: [id number] DOB: 1927/12/14   Cancelled Treatment:    Reason Eval/Treat Not Completed: Other (comment) Spoke with RN. Reports pt just medicated and preparing for surgery today. Will hold off formal evaluation at this time. Please re-order PT post-op with any new pertinent precautions or updated weight-bearing changes.  Thank you,  Ellouise Newer 09/11/2020, 10:58 AM

## 2020-09-11 NOTE — Op Note (Signed)
Orthopaedic Surgery Operative Note (CSN: [id number])  Megan Pitts  February 23, 1927 Date of Surgery: 09/11/2020   Diagnoses:  Right ankle fracture dislocation  Procedure: Right trimalleolar open reduction internal fixation Syndesmosis ORIF   Operative Finding Successful completion of the planned procedure.  Patient's bone quality was exceptionally poor.  She had a comminuted medial side however we felt that a plate with her skin quality would be a poor option.  We avoided periosteal stripping and were able to obtain appropriate reduction.  We then used a lateral locking plate to obtain good fixation of the lateral side.  Our lag screw had very little purchase as the bone quality was so poor that it was essentially just for position.  We used a Quadra cortical screw to fixate the syndesmosis.  We expect the screw to break based on our experience or at least become loose.  We would remove it if the patient desired it.  Post-operative plan: The patient will be touchdown weightbearing for 6 weeks with progressive weightbearing afterwards.  The patient will be readmitted the floor with expected discharge to a SNF.  DVT prophylaxis Lovenox 40 mg/day until mobilizing and then consider transition in clinic to alternative medicines.   Pain control with PRN pain medication preferring oral medicines.  Follow up plan will be scheduled in approximately 7 days for incision check and XR.  Post-Op Diagnosis: Same Surgeons:Primary: Hiram Gash, MD Assistants:Caroline McBane PA-C Location: [facility] OR ROOM 08 Anesthesia: Sedation plus regional anesthesia Antibiotics: Ancef 2 g with local vancomycin powder 1 g at the surgical site Tourniquet time: 45 Estimated Blood Loss: Minimal Complications: None Specimens: None Implants: Implant Name Type Inv. Item Serial No. Manufacturer Lot No. LRB No. Used Action  SCREW COMP KREULOCK 2.7X16 - [id number] Screw SCREW COMP KREULOCK 2.7X16  [employer]  Right 2 Implanted  SCREW  COMP KREULOCK 2.7X14 - [id number] Screw SCREW COMP KREULOCK 2.7X14  [employer]  Right 2 Implanted  SCREW LP CANN 4.0X45MM - [id number] Screw SCREW LP CANN 4.0X45MM  [employer]  Right 2 Implanted    Indications for Surgery:   Megan Pitts is a 87 y.o. female with fall resulting in ankle fracture dislocation reduced by the emergency room.  This was an unstable fracture and still necessitated surgery.  Benefits and risks of operative and nonoperative management were discussed prior to surgery with patient/guardian(s) and informed consent form was completed.  Specific risks including infection, need for additional surgery, skin issues, healing issues, nonunion, malunion, postoperative pain.   Procedure:   The patient was identified properly. Informed consent was obtained and the surgical site was marked. The patient was taken up to suite where general anesthesia was induced.  The patient was positioned supine on a regular bed.  The right ankle was prepped and draped in the usual sterile fashion.  Timeout was performed before the beginning of the case.  Tourniquet was used for the above duration.  We began with the medial side of the ankle.  Went through skin sharply achieving hemostasis we progressed using a 4 cm incision centered on the anterior medial aspect of the distal tibia over the medial malleolus.  We identified the saphenous vein and nerve and retracted these.  We then were able to identify that there was some comminution however we felt that due to the patient's thin superficial tissues we did not feel a plate would be appropriate.  We instead used a point-to-point locking clamp to obtain a provisional reduction and hold  this with 2 partially-threaded cannulated screws.  With good purchase with the screws.  Medial malleolus had appropriate overall anatomic reduction.  We turned our attention the lateral side.  We went through skin sharply achieving hemostasis we progressed.  We identified that  the fracture site was relatively oblique we cleared hematoma from the fracture site and used a point-to-point clamp to hold it in appropriate position.  We used a 2.7 millimeter screw by technique to lag the fracture together.  There was poor purchase with the screw.  We then selected an Ar[employer]rex lateral distal fibular locking plate with 4 holes and used a series of locking and nonlocking screws proximal and distal to the fracture to hold it in place.  We used 1 screw Quadra cortical across the syndesmosis to fixate the syndesmosis as well.  The posterior malleolus fragment was less than 20% of the joint we did not feel that would require fixation.  We irrigated the wound copiously before placing local antibiotic as listed above.  We closed the incision in a multilayer fashion with nonabsorbable suture.  Sterile dressing was placed.  Well molded well-padded posterior slab splint with stirrup was placed.  Patient was awoken taken to PACU in stable condition.  Noemi Chapel, PA-C, present and scrubbed throughout the case, critical for completion in a timely fashion, and for retraction, instrumentation, closure.

## 2020-09-11 NOTE — Anesthesia Procedure Notes (Signed)
Anesthesia Regional Block: Popliteal block   Pre-Anesthetic Checklist: , timeout performed,  Correct Patient, Correct Site, Correct Laterality,  Correct Procedure, Correct Position, site marked,  Risks and benefits discussed,  Surgical consent,  Pre-op evaluation,  At surgeon's request and post-op pain management  Laterality: Right and Lower  Prep: chloraprep       Needles:  Injection technique: Single-shot      Needle Length: 9cm  Needle Gauge: 22     Additional Needles: Arrow StimuQuik ECHO Echogenic Stimulating PNB Needle  Procedures:,,,, ultrasound used (permanent image in chart),,    Narrative:  Start time: 09/11/2020 1:26 PM End time: 09/11/2020 1:29 PM Injection made incrementally with aspirations every 5 mL.  Performed by: Personally  Anesthesiologist: Oleta Mouse, MD

## 2020-09-11 NOTE — Plan of Care (Signed)
?  Problem: Clinical Measurements: ?Goal: Will remain free from infection ?Outcome: Progressing ?  ?

## 2020-09-12 DIAGNOSIS — E785 Hyperlipidemia, unspecified: Secondary | ICD-10-CM | POA: Diagnosis not present

## 2020-09-12 DIAGNOSIS — S82891A Other fracture of right lower leg, initial encounter for closed fracture: Secondary | ICD-10-CM | POA: Diagnosis not present

## 2020-09-12 DIAGNOSIS — I350 Nonrheumatic aortic (valve) stenosis: Secondary | ICD-10-CM | POA: Diagnosis not present

## 2020-09-12 DIAGNOSIS — I5032 Chronic diastolic (congestive) heart failure: Secondary | ICD-10-CM | POA: Diagnosis not present

## 2020-09-12 LAB — CBC
HCT: 42.2 % (ref 36.0–46.0)
Hemoglobin: 14 g/dL (ref 12.0–15.0)
MCH: 29.7 pg (ref 26.0–34.0)
MCHC: 33.2 g/dL (ref 30.0–36.0)
MCV: 89.6 fL (ref 80.0–100.0)
Platelets: 311 10*3/uL (ref 150–400)
RBC: 4.71 MIL/uL (ref 3.87–5.11)
RDW: 12.7 % (ref 11.5–15.5)
WBC: 10.9 10*3/uL — ABNORMAL HIGH (ref 4.0–10.5)
nRBC: 0 % (ref 0.0–0.2)

## 2020-09-12 LAB — BASIC METABOLIC PANEL
Anion gap: 12 (ref 5–15)
BUN: 14 mg/dL (ref 8–23)
CO2: 25 mmol/L (ref 22–32)
Calcium: 8.9 mg/dL (ref 8.9–10.3)
Chloride: 99 mmol/L (ref 98–111)
Creatinine, Ser: 0.97 mg/dL (ref 0.44–1.00)
GFR, Estimated: 56 mL/min — ABNORMAL LOW (ref >60)
Glucose, Bld: 128 mg/dL — ABNORMAL HIGH (ref 70–99)
Potassium: 3.8 mmol/L (ref 3.5–5.1)
Sodium: 136 mmol/L (ref 135–145)

## 2020-09-12 MED ORDER — GABAPENTIN 100 MG PO CAPS
100.0000 mg | ORAL_CAPSULE | Freq: Every day | ORAL | Status: DC
  Administered 2020-09-12 – 2020-09-15 (×4): 100 mg via ORAL
  Filled 2020-09-12 (×4): qty 1

## 2020-09-12 NOTE — Progress Notes (Signed)
Subjective: 1 Day Post-Op s/p Procedure(s): OPEN REDUCTION INTERNAL FIXATION (ORIF) ANKLE FRACTURE   Patient is alert, oriented. Patient reports pain as moderate, mainly burning in ankle and foot.  Denies chest pain, SOB, Calf pain. No nausea/vomiting. Has not had a bowel movement in 3 days. No other complaints.   Objective:  PE: VITALS:   Vitals:   09/11/20 1607 09/11/20 1935 09/11/20 2356 09/12/20 0250  BP: (!) 153/59 (!) 123/50 (!) 150/54 (!) 156/56  Pulse: 78 79 71 67  Resp:  '18 17 14  '$ Temp:  98.2 F (36.8 C) 98.1 F (36.7 C) 98.8 F (37.1 C)  TempSrc:  Oral Oral Oral  SpO2: 99% 96% 99% 96%  Weight:      Height:       General: sitting up in bed, in no acute distress GI: abdomen nontender, soft MSK: RLE in splint. Able to flex and extend all toes. Toes warm and well perfused. Sensation intact to all toes.   LABS  No results found for this or any previous visit (from the past 24 hour(s)).  DG Ankle Complete Right  Result Date: 09/10/2020 CLINICAL DATA:  Status post reduction of right ankle fracture. EXAM: RIGHT ANKLE - COMPLETE 3+ VIEW COMPARISON:  September 10, 2020. FINDINGS: Right ankle has been casted and immobilized. There has been partial reduction of the talotibial dislocation noted on prior exam, with mild persistent lateral dislocation remaining. Displaced fractures involving the medial malleolus and distal right fibula are also noted and have been partially reduced since prior exam. IMPRESSION: Casting and immobilization of right ankle status post partial reduction of talotibial dislocation as well as displaced fractures involving medial malleolus and distal right fibula. Electronically Signed   By: Marijo Conception M.D.   On: 09/10/2020 14:39   CT HEAD WO CONTRAST (5MM)  Result Date: 09/10/2020 CLINICAL DATA:  Head injury after fall. EXAM: CT HEAD WITHOUT CONTRAST TECHNIQUE: Contiguous axial images were obtained from the base of the skull through the vertex  without intravenous contrast. COMPARISON:  September 24, 2017. FINDINGS: Brain: No evidence of acute infarction, hemorrhage, hydrocephalus, extra-axial collection or mass lesion/mass effect. Vascular: No hyperdense vessel or unexpected calcification. Skull: Normal. Negative for fracture or focal lesion. Sinuses/Orbits: No acute finding. Other: None. IMPRESSION: No acute intracranial abnormality seen. Electronically Signed   By: Marijo Conception M.D.   On: 09/10/2020 15:40   CT ANKLE RIGHT WO CONTRAST  Result Date: 09/10/2020 CLINICAL DATA:  Right ankle fracture dislocation. Fall with ankle deformity. EXAM: CT OF THE RIGHT ANKLE WITHOUT CONTRAST TECHNIQUE: Multidetector CT imaging of the right ankle was performed according to the standard protocol. Multiplanar CT image reconstructions were also generated. COMPARISON:  Radiographs 09/10/2020 FINDINGS: Bones/Joint/Cartilage The ankle is splinted. The overall alignment is improved from the original radiographs, although there is persistent lateral subluxation of the talar dome relative to the tibial plafond. Comminuted, transverse fracture through the base of the medial malleolus remains laterally displaced by up to 1.4 cm. Intra-articular fracture involving the posterior articular surface of the tibial plafond demonstrates up to 4 mm of depression, best seen on the sagittal images. Oblique, mildly comminuted fracture of the distal fibula demonstrates up to 9 mm of posterior and lateral displacement. This fracture is also mildly angulated laterally. The talar dome and additional tarsal bones appear intact there is only a small ankle joint effusion, and no fracture fragments are seen interposed between the talar dome and tibial plafond. Ligaments Suboptimally assessed by  CT. Muscles and Tendons The ankle tendons appear intact. The posterior tibialis tendon is in close proximity to the fracture of the medial malleolus, but demonstrates no entrapment. Soft tissues Mild  subcutaneous edema surrounding the ankle without evidence of foreign body, focal hematoma or soft tissue emphysema. Scattered vascular calcifications are noted IMPRESSION: 1. Improved alignment of the trimalleolar fracture compared with the original radiographs. There is persistent lateral displacement of the distal fibula and medial malleolus as well as residual lateral subluxation of the talar dome relative to the tibial plafond. 2. No evidence of talar dome or other tarsal bone injury. 3. No evidence ankle tendon rupture or entrapment. Electronically Signed   By: Richardean Sale M.D.   On: 09/10/2020 17:45   DG Ankle Right Port  Result Date: 09/11/2020 CLINICAL DATA:  Postop right ankle fracture. EXAM: PORTABLE RIGHT ANKLE - 2 VIEW COMPARISON:  Preoperative imaging yesterday. FINDINGS: Two screws traverse the medial malleolar fracture in improved alignment. Lateral plate and multi screw fixation of distal fibular fracture with syndesmotic and inter fragmentary screw. Fibular fracture is in improved alignment. Posterior tibial tubercle fracture is obscured on the current exam due to overlying hardware. Improved ankle mortise alignment. Overlying cast material in place. IMPRESSION: ORIF distal tibia and fibular fractures without immediate postoperative complication. Improved fracture and ankle mortise alignment from preoperative exams. Electronically Signed   By: Keith Rake M.D.   On: 09/11/2020 16:10   DG Ankle Right Port  Result Date: 09/10/2020 CLINICAL DATA:  Ankle fracture.  Fall with ankle deformity. EXAM: PORTABLE RIGHT ANKLE - 2 VIEW COMPARISON:  09/18/2013 right foot radiographs FINDINGS: There are comminuted, displaced, and angulated fractures of the distal tibia and fibula. There is lateral and posterior dislocation of the talus relative to the tibia. A medial malleolus fragment remains approximated with the talus. The lateral malleolus is displaced laterally and angulated laterally relative  to the proximal fibula fragment. There is a small plantar calcaneal enthesophyte. Mild soft tissue swelling is noted at the ankle. IMPRESSION: Right ankle fracture-dislocation. Electronically Signed   By: Logan Bores M.D.   On: 09/10/2020 13:15   DG MINI C-ARM IMAGE ONLY  Result Date: 09/11/2020 There is no interpretation for this exam.  This order is for images obtained during a surgical procedure.  Please See "Surgeries" Tab for more information regarding the procedure.    Assessment/Plan: Principal Problem:   Closed right ankle fracture Active Problems:   Aortic stenosis   Hypertension   PVC's (premature ventricular contractions)   Hyperlipidemia   Chronic diastolic CHF (congestive heart failure) (HCC)   Leucocytosis   CKD (chronic kidney disease), stage II  Right ankle fracture 1 Day Post-Op s/p Procedure(s): OPEN REDUCTION INTERNAL FIXATION (ORIF) ANKLE FRACTURE  Weightbearing: NWB RLE Orthopedic device(s): Splint VTE prophylaxis: lovenox '40mg'$ /day until mobilizing Pain control: Hydrocodone 5/325, 7/325, IV dilaudid, tylenol, will add on low dose gabapentin for burning pain Dispo: pending PT eval today  Contact information:   Weekdays 8-5 Merlene Pulling, PA-C [phone] A fter hours and holidays please check [email or web address] for group call information for [place] 09/12/2020, 9:00 AM

## 2020-09-12 NOTE — Progress Notes (Signed)
Pt up to the chair x2 today.  Stand and pivot on left foot with front wheel walker.  Bedside bath.  Pt preference for rehab:  #1=[facility], #2=[facility]. DOES NOT want [facility].

## 2020-09-12 NOTE — Evaluation (Signed)
Occupational Therapy Evaluation Patient Details Name: Megan GURGANIOUS MRN: [id number] DOB: 1927-06-12 Today's Date: 09/12/2020    History of Present Illness Mrs. Megan Pitts is a very pleasant 87 y.o. female with medical history significant of HTN, aortic steonsis s/p AVR in 2007, HLD,  chronic diastolic CHF. [place] her Rt ankle. Currently s/p Rt ankle ORIF on 09/11/20.   Clinical Impression   Pt admitted for concerns/procedure listed above. PTA pt reported that she was independent with all ADL's and IADL's, including hosting "get togethers" at her apartment for her neighbors. At this time, pt presents with increased pain and weakness, as well as balance deficits due to NWB on her RLE. Pt requiring mod A for LB ADL's and transfers, due to additional support needed to maintain balance in standing. Pt will benefit from SNF level therapies at this time to maximize her safety and progress towards independence. OT will follow acutely.     Follow Up Recommendations  SNF;Supervision/Assistance - 24 hour    Equipment Recommendations  Other (comment) (TBD at next venue)    Recommendations for Other Services       Precautions / Restrictions Precautions Precautions: Fall Restrictions Weight Bearing Restrictions: Yes RLE Weight Bearing: Non weight bearing      Mobility Bed Mobility Overal bed mobility: Needs Assistance Bed Mobility: Supine to Sit     Supine to sit: Min assist     General bed mobility comments: Very light assist to rise to EOB, cues for technique. Good LE strength to bring OOB.    Transfers Overall transfer level: Needs assistance Equipment used: Rolling walker (2 wheeled) Transfers: Sit to/from [facility] Sit to Stand: Mod assist Stand pivot transfers: Mod assist       General transfer comment: Mod assist for boost to stand from bed with cues for hand placement, NWB through RLE. Pt distracted by  urinary incontinence and releasing RW several times requiring cues for safety awareness and technique. Mod assist to mobilize RW for pt, VC for sequencing with pivot towards her left side with good strength to remain standing and sustain NWB through Rt.    Balance Overall balance assessment: Needs assistance Sitting-balance support: No upper extremity supported;Feet supported Sitting balance-Leahy Scale: Good     Standing balance support: Bilateral upper extremity supported;During functional activity Standing balance-Leahy Scale: Poor                             ADL either performed or assessed with clinical judgement   ADL Overall ADL's : Needs assistance/impaired Eating/Feeding: Set up;Sitting   Grooming: Set up;Sitting   Upper Body Bathing: Set up;Sitting   Lower Body Bathing: Moderate assistance;Maximal assistance;Sitting/lateral leans;Sit to/from stand   Upper Body Dressing : Set up;Sitting   Lower Body Dressing: Moderate assistance;Sitting/lateral leans;Sit to/from stand   Toilet Transfer: Moderate assistance;Stand-pivot   Toileting- Clothing Manipulation and Hygiene: Moderate assistance;Sitting/lateral lean;Sit to/from stand   Tub/ Banker: Moderate assistance   Functional mobility during ADLs: Moderate assistance;Rolling walker General ADL Comments: Pt maintianing NWB precautions well, requiring mod A for all LB ADL's due to balance when needing to stand to complete tasks.     Vision Baseline Vision/History: 1 Wears glasses Ability to See in Adequate Light: 0 Adequate Patient Visual Report: No change from baseline Vision Assessment?: No apparent visual deficits     Perception Perception Perception Tested?: No   Praxis  Praxis Praxis tested?: Not tested    Pertinent Vitals/Pain Pain Assessment: 0-10 Pain Score: 5  Pain Location: Rt ankle Pain Descriptors / Indicators: Burning Pain Intervention(s): Monitored during session;Repositioned      Hand Dominance Right   Extremity/Trunk Assessment Upper Extremity Assessment Upper Extremity Assessment: Overall WFL for tasks assessed   Lower Extremity Assessment Lower Extremity Assessment: RLE deficits/detail RLE Deficits / Details: Limited due to bracing RLE: Unable to fully assess due to immobilization RLE Sensation: WNL (Toes)   Cervical / Trunk Assessment Cervical / Trunk Assessment: Normal   Communication Communication Communication: HOH   Cognition Arousal/Alertness: Awake/alert Behavior During Therapy: WFL for tasks assessed/performed Overall Cognitive Status: Within Functional Limits for tasks assessed                                     General Comments  VSS on RA    Exercises     Shoulder Instructions      Home Living Family/patient expects to be discharged to:: Skilled nursing facility Living Arrangements: Alone Available Help at Discharge: Family;Friend(s);Available PRN/intermittently (daughter lives 5 min away) Type of Home: Apartment Home Access: Other (comment) (small threshold)     Home Layout: One level     Bathroom Shower/Tub: Walk-in shower;Door         Home Equipment: Wheelchair - [employer] - 4 wheels;Walker - standard;Cane - single point   Additional Comments: Pt lives in what sounds like a senior living apartment or an ILF. The apartment is all handicap accessible.      Prior Functioning/Environment Level of Independence: Independent                 OT Problem List: Decreased strength;Decreased activity tolerance;Impaired balance (sitting and/or standing);Decreased safety awareness;Decreased knowledge of use of DME or AE;Pain      OT Treatment/Interventions: Self-care/ADL training;Therapeutic exercise;Energy conservation;DME and/or AE instruction;Therapeutic activities;Patient/family education;Balance training    OT Goals(Current goals can be found in the care plan section) Acute Rehab OT  Goals Patient Stated Goal: Get back home OT Goal Formulation: With patient Time For Goal Achievement: 09/26/20 Potential to Achieve Goals: Good ADL Goals Pt Will Perform Lower Body Bathing: with min assist;sitting/lateral leans;sit to/from stand Pt Will Perform Lower Body Dressing: with min assist;sitting/lateral leans;sit to/from stand Pt Will Transfer to Toilet: with min guard assist;stand pivot transfer Pt Will Perform Toileting - Clothing Manipulation and hygiene: with min assist;sitting/lateral leans;sit to/from stand Additional ADL Goal #1: Pt will verbalize 3 fall prevention techniques  OT Frequency: Min 2X/week   Barriers to D/C:            Co-evaluation              AM-PAC OT "6 Clicks" Daily Activity     Outcome Measure Help from another person eating meals?: A Little Help from another person taking care of personal grooming?: A Little Help from another person toileting, which includes using toliet, bedpan, or urinal?: A Lot Help from another person bathing (including washing, rinsing, drying)?: A Lot Help from another person to put on and taking off regular upper body clothing?: A Little Help from another person to put on and taking off regular lower body clothing?: A Lot 6 Click Score: 15   End of Session Equipment Utilized During Treatment: Rolling walker;Gait belt Nurse Communication: Mobility status  Activity Tolerance: Patient tolerated treatment well Patient left: in chair;with call bell/phone within reach;with chair  alarm set  OT Visit Diagnosis: Unsteadiness on feet (R26.81);Other abnormalities of gait and mobility (R26.89);Muscle weakness (generalized) (M62.81)                Time: [id number] OT Time Calculation (min): 34 min Charges:  OT General Charges $OT Visit: 1 Visit OT Evaluation $OT Eval Moderate Complexity: 1 Mod OT Treatments $Self Care/Home Management : 8-22 mins  Javia Dillow H., OTR/L Acute Rehabilitation  Barri Neidlinger Elane Chava Dulac 09/12/2020,  3:31 PM

## 2020-09-12 NOTE — Progress Notes (Signed)
PROGRESS NOTE    Megan Pitts  [id number] DOB: 1927-07-25 DOA: 09/10/2020 PCP: Ginger Organ., MD   Brief Narrative: Megan Pitts is a 87 y.o. female with a history of hypertension, aortic stenosis s/p AVR, hyperlipidemia, diastolic heart failure. Patient presented after a fall, suffering a right ankle fracture. Orthopedic surgery consulted for surgical management.   Assessment & Plan:   Principal Problem:   Closed right ankle fracture Active Problems:   Aortic stenosis   Hypertension   PVC's (premature ventricular contractions)   Hyperlipidemia   Chronic diastolic CHF (congestive heart failure) (HCC)   Leucocytosis   CKD (chronic kidney disease), stage II   Right ankle fracture Secondary to misstep and fall, twisting her ankle with resultant fracture. Orthopedic surgery consulted on admission. -Orthopedic surgery recommendations: NWB RLE, PT, Lovenox for VTE prophylaxis, analgesics  Primary hypertension -Continue amlodipine, hydrochlorothiazide, irbesartan (substituted for olmesartan while inpatient)  Leukocytosis Likely reactive in setting of fracture.  CKD stage II Creatinine stable.  Chronic diastolic heart failure Euvolemic. Stable.  Aortic stenosis History of AVR. Not on anticoagulation.  History of PVCs Noted.   DVT prophylaxis: Lovenox per orthopedic surgery Code Status:   Code Status: Full Code Family Communication: None at bedside Disposition Plan: Discharge pending orthopedic surgery management and eventual PT/OT recommendations. Likely SNF   Consultants:  Orthopedic surgery  Procedures:  None  Antimicrobials: None    Subjective: No issues today although she does have a little pain with moving her toes. No other concerns.  Objective: Vitals:   09/11/20 1935 09/11/20 2356 09/12/20 0250 09/12/20 1002  BP: (!) 123/50 (!) 150/54 (!) 156/56 (!) 155/41  Pulse: 79 71 67 62  Resp: '18 17 14 18  '$ Temp: 98.2 F (36.8 C) 98.1 F (36.7  C) 98.8 F (37.1 C) 98.2 F (36.8 C)  TempSrc: Oral Oral Oral Oral  SpO2: 96% 99% 96% 98%  Weight:      Height:        Intake/Output Summary (Last 24 hours) at 09/12/2020 1320 Last data filed at 09/12/2020 0511 Gross per 24 hour  Intake 960 ml  Output 1115 ml  Net -155 ml    Filed Weights   09/10/20 1240  Weight: 72.6 kg    Examination:  General exam: Appears calm and comfortable Respiratory system: Clear to auscultation. Respiratory effort normal. Cardiovascular system: S1 & S2 heard. Systolic murmur Gastrointestinal system: Abdomen is nondistended, soft and nontender. No organomegaly or masses felt. Normal bowel sounds heard. Central nervous system: Alert and oriented. No focal neurological deficits. Musculoskeletal: Right ankle splint. Skin: No cyanosis. No rashes Psychiatry: Judgement and insight appear normal. Mood & affect appropriate.     Data Reviewed: I have personally reviewed following labs and imaging studies  CBC Lab Results  Component Value Date   WBC 10.9 (H) 09/12/2020   RBC 4.71 09/12/2020   HGB 14.0 09/12/2020   HCT 42.2 09/12/2020   MCV 89.6 09/12/2020   MCH 29.7 09/12/2020   PLT 311 09/12/2020   MCHC 33.2 09/12/2020   RDW 12.7 09/12/2020   LYMPHSABS 1.4 09/10/2020   MONOABS 0.9 09/10/2020   EOSABS 0.1 09/10/2020   BASOSABS 0.1 [id number]     Last metabolic panel Lab Results  Component Value Date   NA 136 09/12/2020   K 3.8 09/12/2020   CL 99 09/12/2020   CO2 25 09/12/2020   BUN 14 09/12/2020   CREATININE 0.97 09/12/2020   GLUCOSE 128 (H) 09/12/2020   GFRNONAA  56 (L) 09/12/2020   GFRAA 55 (L) 06/05/2019   CALCIUM 8.9 09/12/2020   PROT 6.2 (L) 06/04/2019   ALBUMIN 3.4 (L) 06/04/2019   BILITOT 1.3 (H) 06/04/2019   ALKPHOS 59 06/04/2019   AST 16 06/04/2019   ALT 19 06/04/2019   ANIONGAP 12 09/12/2020    CBG (last 3)  No results for input(s): GLUCAP in the last 72 hours.   GFR: Estimated Creatinine Clearance: 37.2 mL/min  (by C-G formula based on SCr of 0.97 mg/dL).  Coagulation Profile: No results for input(s): INR, PROTIME in the last 168 hours.  Recent Results (from the past 240 hour(s))  Resp Panel by RT-PCR (Flu A&B, Covid) Nasopharyngeal Swab     Status: None   Collection Time: 09/10/20  1:55 PM   Specimen: Nasopharyngeal Swab; Nasopharyngeal(NP) swabs in vial transport medium  Result Value Ref Range Status   SARS Coronavirus 2 by RT PCR NEGATIVE NEGATIVE Final    Comment: (NOTE) SARS-CoV-2 target nucleic acids are NOT DETECTED.  The SARS-CoV-2 RNA is generally detectable in upper respiratory specimens during the acute phase of infection. The lowest concentration of SARS-CoV-2 viral copies this assay can detect is 138 copies/mL. A negative result does not preclude SARS-Cov-2 infection and should not be used as the sole basis for treatment or other patient management decisions. A negative result may occur with  improper specimen collection/handling, submission of specimen other than nasopharyngeal swab, presence of viral mutation(s) within the areas targeted by this assay, and inadequate number of viral copies(<138 copies/mL). A negative result must be combined with clinical observations, patient history, and epidemiological information. The expected result is Negative.  Fact Sheet for Patients:  EntrepreneurPulse.com.au  Fact Sheet for Healthcare Providers:  IncredibleEmployment.be  This test is no t yet approved or cleared by the [place] FDA and  has been authorized for detection and/or diagnosis of SARS-CoV-2 by FDA under an Emergency Use Authorization (EUA). This EUA will remain  in effect (meaning this test can be used) for the duration of the COVID-19 declaration under Section 564(b)(1) of the Act, 21 U.S.C.section 360bbb-3(b)(1), unless the authorization is terminated  or revoked sooner.       Influenza A by PCR NEGATIVE NEGATIVE Final    Influenza B by PCR NEGATIVE NEGATIVE Final    Comment: (NOTE) The Xpert Xpress SARS-CoV-2/FLU/RSV plus assay is intended as an aid in the diagnosis of influenza from Nasopharyngeal swab specimens and should not be used as a sole basis for treatment. Nasal washings and aspirates are unacceptable for Xpert Xpress SARS-CoV-2/FLU/RSV testing.  Fact Sheet for Patients: EntrepreneurPulse.com.au  Fact Sheet for Healthcare Providers: IncredibleEmployment.be  This test is not yet approved or cleared by the [place] FDA and has been authorized for detection and/or diagnosis of SARS-CoV-2 by FDA under an Emergency Use Authorization (EUA). This EUA will remain in effect (meaning this test can be used) for the duration of the COVID-19 declaration under Section 564(b)(1) of the Act, 21 U.S.C. section 360bbb-3(b)(1), unless the authorization is terminated or revoked.  Performed at [facility], [place] [address] [place], [place] [address]   Surgical pcr screen     Status: None   Collection Time: 09/10/20  9:36 PM   Specimen: Nasal Mucosa; Nasal Swab  Result Value Ref Range Status   MRSA, PCR NEGATIVE NEGATIVE Final   Staphylococcus aureus NEGATIVE NEGATIVE Final    Comment: (NOTE) The Xpert SA Assay (FDA approved for NASAL specimens in patients 31 years of age  and older), is one component of a comprehensive surveillance program. It is not intended to diagnose infection nor to guide or monitor treatment. Performed at [facility], [place] [address], [place], [place] [address]         Radiology Studies: DG Ankle Complete Right  Result Date: 09/10/2020 CLINICAL DATA:  Status post reduction of right ankle fracture. EXAM: RIGHT ANKLE - COMPLETE 3+ VIEW COMPARISON:  September 10, 2020. FINDINGS: Right ankle has been casted and immobilized. There has been partial reduction of the talotibial dislocation noted on prior exam, with mild persistent  lateral dislocation remaining. Displaced fractures involving the medial malleolus and distal right fibula are also noted and have been partially reduced since prior exam. IMPRESSION: Casting and immobilization of right ankle status post partial reduction of talotibial dislocation as well as displaced fractures involving medial malleolus and distal right fibula. Electronically Signed   By: Marijo Conception M.D.   On: 09/10/2020 14:39   CT HEAD WO CONTRAST (5MM)  Result Date: 09/10/2020 CLINICAL DATA:  Head injury after fall. EXAM: CT HEAD WITHOUT CONTRAST TECHNIQUE: Contiguous axial images were obtained from the base of the skull through the vertex without intravenous contrast. COMPARISON:  September 24, 2017. FINDINGS: Brain: No evidence of acute infarction, hemorrhage, hydrocephalus, extra-axial collection or mass lesion/mass effect. Vascular: No hyperdense vessel or unexpected calcification. Skull: Normal. Negative for fracture or focal lesion. Sinuses/Orbits: No acute finding. Other: None. IMPRESSION: No acute intracranial abnormality seen. Electronically Signed   By: Marijo Conception M.D.   On: 09/10/2020 15:40   CT ANKLE RIGHT WO CONTRAST  Result Date: 09/10/2020 CLINICAL DATA:  Right ankle fracture dislocation. Fall with ankle deformity. EXAM: CT OF THE RIGHT ANKLE WITHOUT CONTRAST TECHNIQUE: Multidetector CT imaging of the right ankle was performed according to the standard protocol. Multiplanar CT image reconstructions were also generated. COMPARISON:  Radiographs 09/10/2020 FINDINGS: Bones/Joint/Cartilage The ankle is splinted. The overall alignment is improved from the original radiographs, although there is persistent lateral subluxation of the talar dome relative to the tibial plafond. Comminuted, transverse fracture through the base of the medial malleolus remains laterally displaced by up to 1.4 cm. Intra-articular fracture involving the posterior articular surface of the tibial plafond  demonstrates up to 4 mm of depression, best seen on the sagittal images. Oblique, mildly comminuted fracture of the distal fibula demonstrates up to 9 mm of posterior and lateral displacement. This fracture is also mildly angulated laterally. The talar dome and additional tarsal bones appear intact there is only a small ankle joint effusion, and no fracture fragments are seen interposed between the talar dome and tibial plafond. Ligaments Suboptimally assessed by CT. Muscles and Tendons The ankle tendons appear intact. The posterior tibialis tendon is in close proximity to the fracture of the medial malleolus, but demonstrates no entrapment. Soft tissues Mild subcutaneous edema surrounding the ankle without evidence of foreign body, focal hematoma or soft tissue emphysema. Scattered vascular calcifications are noted IMPRESSION: 1. Improved alignment of the trimalleolar fracture compared with the original radiographs. There is persistent lateral displacement of the distal fibula and medial malleolus as well as residual lateral subluxation of the talar dome relative to the tibial plafond. 2. No evidence of talar dome or other tarsal bone injury. 3. No evidence ankle tendon rupture or entrapment. Electronically Signed   By: Richardean Sale M.D.   On: 09/10/2020 17:45   DG Ankle Right Port  Result Date: 09/11/2020 CLINICAL DATA:  Postop right ankle fracture.  EXAM: PORTABLE RIGHT ANKLE - 2 VIEW COMPARISON:  Preoperative imaging yesterday. FINDINGS: Two screws traverse the medial malleolar fracture in improved alignment. Lateral plate and multi screw fixation of distal fibular fracture with syndesmotic and inter fragmentary screw. Fibular fracture is in improved alignment. Posterior tibial tubercle fracture is obscured on the current exam due to overlying hardware. Improved ankle mortise alignment. Overlying cast material in place. IMPRESSION: ORIF distal tibia and fibular fractures without immediate postoperative  complication. Improved fracture and ankle mortise alignment from preoperative exams. Electronically Signed   By: Keith Rake M.D.   On: 09/11/2020 16:10   DG MINI C-ARM IMAGE ONLY  Result Date: 09/11/2020 There is no interpretation for this exam.  This order is for images obtained during a surgical procedure.  Please See "Surgeries" Tab for more information regarding the procedure.        Scheduled Meds:  acetaminophen  500 mg Oral Q6H   amLODipine  10 mg Oral Daily   docusate sodium  100 mg Oral BID   donepezil  10 mg Oral QHS   enoxaparin (LOVENOX) injection  30 mg Subcutaneous Q24H   etomidate  10 mg Intravenous Once   gabapentin  100 mg Oral QHS   hydrochlorothiazide  25 mg Oral Daily   irbesartan  300 mg Oral Daily   sodium chloride flush  3 mL Intravenous Q12H   Continuous Infusions:  sodium chloride      ceFAZolin (ANCEF) IV 1 g (09/12/20 0509)     LOS: 2 days     Cordelia Poche, MD [facility] 09/12/2020, 1:20 PM  If 7PM-7AM, please contact night-coverage [email or web address]

## 2020-09-12 NOTE — Evaluation (Signed)
Physical Therapy Evaluation Patient Details Name: Megan Pitts MRN: [id number] DOB: Mar 14, 1927 Today's Date: 09/12/2020   History of Present Illness  Megan Pitts is a very pleasant 87 y.o. female with medical history significant of HTN, aortic steonsis s/p AVR in 2007, HLD,  chronic diastolic CHF. [place] fractured her Rt ankle. Currently s/p Rt ankle ORIF on 09/11/20.  Clinical Impression  Patient is s/p above surgery resulting in functional limitations due to the deficits listed below (see PT Problem List). Min assist with bed mobility, up to mod assist with sit<>stand, and stand pivot transfers during evaluation. Very motivated, participated very well with therex. Requires safety reinforcement with transfers, distracted 2/2 incontinence, releasing RW at times. Patient will benefit from skilled PT to increase their independence and safety with mobility to allow discharge to the venue listed below.       Follow Up Recommendations SNF    Equipment Recommendations  None recommended by PT (TBD next venue of care)    Recommendations for Other Services       Precautions / Restrictions Precautions Precautions: Fall Restrictions Weight Bearing Restrictions: Yes RLE Weight Bearing: Non weight bearing      Mobility  Bed Mobility Overal bed mobility: Needs Assistance Bed Mobility: Supine to Sit     Supine to sit: Min assist     General bed mobility comments: Very light assist to rise to EOB, cues for technique. Good LE strength to bring OOB.    Transfers Overall transfer level: Needs assistance Equipment used: Rolling walker (2 wheeled) Transfers: Sit to/from [facility] Sit to Stand: Mod assist Stand pivot transfers: Mod assist       General transfer comment: Mod assist for boost to stand from bed with cues for hand placement, NWB through RLE. Pt distracted by urinary incontinence and releasing RW several times  requiring cues for safety awareness and technique. Mod assist to mobilize RW for pt, VC for sequencing with pivot towards her left side with good strength to remain standing and sustain NWB through Rt.  Ambulation/Gait                Stairs            Wheelchair Mobility    Modified Rankin (Stroke Patients Only)       Balance Overall balance assessment: Needs assistance Sitting-balance support: No upper extremity supported;Feet supported Sitting balance-Leahy Scale: Good     Standing balance support: Bilateral upper extremity supported;During functional activity Standing balance-Leahy Scale: Poor                               Pertinent Vitals/Pain Pain Assessment: 0-10 Pain Score: 9  Pain Location: Rt ankle Pain Descriptors / Indicators: Burning Pain Intervention(s): Limited activity within patient's tolerance;Monitored during session;Repositioned    Home Living Family/patient expects to be discharged to:: Skilled nursing facility Living Arrangements: Alone Available Help at Discharge: Family;Friend(s);Available PRN/intermittently (daughter lives 5 min away) Type of Home: House Home Access: Other (comment) (small threshold)     Home Layout: One level Home Equipment: Wheelchair - [employer] - 4 wheels;Walker - standard;Cane - single point      Prior Function Level of Independence: Independent               Hand Dominance   Dominant Hand: Right    Extremity/Trunk Assessment   Upper Extremity Assessment Upper Extremity Assessment:  Defer to OT evaluation    Lower Extremity Assessment Lower Extremity Assessment: RLE deficits/detail;Generalized weakness RLE Deficits / Details: Limited due to bracing RLE: Unable to fully assess due to immobilization RLE Sensation: WNL (Toes)       Communication   Communication: HOH  Cognition Arousal/Alertness: Awake/alert Behavior During Therapy: WFL for tasks assessed/performed Overall  Cognitive Status: Within Functional Limits for tasks assessed                                        General Comments General comments (skin integrity, edema, etc.): Pt on 2L supplemental O2 when PT entered room and left at this setting.    Exercises General Exercises - Lower Extremity Ankle Circles/Pumps: AROM;Left;20 reps;Seated Gluteal Sets: Strengthening;Both;20 reps;Seated Long Arc Quad: Strengthening;Both;20 reps;Seated Hip ABduction/ADduction: Strengthening;Both;20 reps;Seated   Assessment/Plan    PT Assessment Patient needs continued PT services  PT Problem List Decreased strength;Decreased range of motion;Decreased balance;Decreased activity tolerance;Decreased mobility;Decreased knowledge of use of DME;Decreased safety awareness;Decreased knowledge of precautions;Pain       PT Treatment Interventions DME instruction;Gait training;Functional mobility training;Therapeutic activities;Therapeutic exercise;Balance training;Neuromuscular re-education;Patient/family education    PT Goals (Current goals can be found in the Care Plan section)  Acute Rehab PT Goals Patient Stated Goal: Get well PT Goal Formulation: With patient Time For Goal Achievement: 09/26/20 Potential to Achieve Goals: Good    Frequency Min 3X/week   Barriers to discharge Decreased caregiver support lives alone    Co-evaluation               AM-PAC PT "6 Clicks" Mobility  Outcome Measure Help needed turning from your back to your side while in a flat bed without using bedrails?: A Little Help needed moving from lying on your back to sitting on the side of a flat bed without using bedrails?: A Little Help needed moving to and from a bed to a chair (including a wheelchair)?: A Lot Help needed standing up from a chair using your arms (e.g., wheelchair or bedside chair)?: A Lot Help needed to walk in hospital room?: Total Help needed climbing 3-5 steps with a railing? : Total 6  Click Score: 12    End of Session Equipment Utilized During Treatment: Gait belt;Oxygen Activity Tolerance: Patient tolerated treatment well Patient left: in chair;with call bell/phone within reach;with chair alarm set;with SCD's reapplied Nurse Communication: Mobility status PT Visit Diagnosis: Muscle weakness (generalized) (M62.81);History of falling (Z91.81);Difficulty in walking, not elsewhere classified (R26.2);Pain Pain - Right/Left: Right Pain - part of body: Ankle and joints of foot    Time: [id number] PT Time Calculation (min) (ACUTE ONLY): 47 min   Charges:   PT Evaluation $PT Eval Low Complexity: 1 Low PT Treatments $Therapeutic Exercise: 8-22 mins $Therapeutic Activity: 8-22 mins        Candie Mile, PT, DPT  Ellouise Newer 09/12/2020, 9:44 AM

## 2020-09-13 DIAGNOSIS — S82891A Other fracture of right lower leg, initial encounter for closed fracture: Secondary | ICD-10-CM | POA: Diagnosis not present

## 2020-09-13 MED ORDER — ENOXAPARIN SODIUM 40 MG/0.4ML IJ SOSY
40.0000 mg | PREFILLED_SYRINGE | INTRAMUSCULAR | Status: DC
  Administered 2020-09-14 – 2020-09-16 (×3): 40 mg via SUBCUTANEOUS
  Filled 2020-09-13 (×3): qty 0.4

## 2020-09-13 MED ORDER — POLYETHYLENE GLYCOL 3350 17 G PO PACK
17.0000 g | PACK | Freq: Once | ORAL | Status: AC
  Administered 2020-09-13: 17 g via ORAL
  Filled 2020-09-13: qty 1

## 2020-09-13 NOTE — TOC CAGE-AID Note (Signed)
Transition of Care [facility]) - CAGE-AID Screening   Patient Details  Name: Megan Pitts MRN: [id number] Date of Birth: 08-29-1927  [facility] CM/SW Contact:    [name], [place] Phone Number: 09/13/2020, 10:57 AM   Clinical Narrative: Pt is unable to participate in Cage Aid.  Pt is in appropriate for assessment.  Megan Pitts, MSW, LCSW-A Pronouns:  She/Her/Hers [facility]itions of [facility] Clinical Social Worker Direct Number:  [phone] [email or web address]  CAGE-AID Screening: Substance Abuse Screening unable to be completed due to: : Patient unable to participate

## 2020-09-13 NOTE — Anesthesia Postprocedure Evaluation (Signed)
Anesthesia Post Note  Patient: Megan Pitts  Procedure(s) Performed: OPEN REDUCTION INTERNAL FIXATION (ORIF) ANKLE FRACTURE (Right: Ankle)     Patient location during evaluation: PACU Anesthesia Type: Regional and MAC Level of consciousness: awake and alert Pain management: pain level controlled Vital Signs Assessment: post-procedure vital signs reviewed and stable Respiratory status: spontaneous breathing, nonlabored ventilation, respiratory function stable and patient connected to nasal cannula oxygen Cardiovascular status: blood pressure returned to baseline and stable Postop Assessment: no apparent nausea or vomiting Anesthetic complications: no   No notable events documented.  Last Vitals:  Vitals:   09/13/20 0844 09/13/20 1551  BP: (!) 128/59 (!) 143/64  Pulse: 72 73  Resp: 18 18  Temp: 36.6 C 36.8 C  SpO2: 96% 97%    Last Pain:  Vitals:   09/13/20 1551  TempSrc: Oral  PainSc:                  Donnamae [place]

## 2020-09-13 NOTE — Progress Notes (Signed)
Physical Therapy Treatment Patient Details Name: Megan Pitts MRN: [id number] DOB: August 24, 1927 Today's Date: 09/13/2020    History of Present Illness Megan Pitts is a very pleasant 87 y.o. female admitted on 09/10/20 s/p fall with R ankle fx s/p ORIF on 09/11/20 NWB post op.  Pt with significant PMH of HTN, aortic steonsis s/p AVR in 2007, HLD,  chronic diastolic CHF.    PT Comments    Pt was able to get up OOB to the recliner chair today with min assist with a lateral scoot.  She then practiced standing with this PT x 3, improving with lower assist levels each stand.  She participated in both seated and standing exercises.  She is appropriate for SNF level therapies at discharge given her NWB status and will work very hard to get better.  Next session, if she is still here bring WC for hallway [place] mobility and training. 18x18 will work with elevating R leg rest.   Follow Up Recommendations  SNF     Equipment Recommendations  Wheelchair (measurements PT);Wheelchair cushion (measurements PT);3in1 (PT);Rolling walker with 5" wheels    Recommendations for Other Services       Precautions / Restrictions Precautions Precautions: Fall Restrictions RLE Weight Bearing: Non weight bearing    Mobility  Bed Mobility Overal bed mobility: Needs Assistance Bed Mobility: Supine to Sit     Supine to sit: Min assist;HOB elevated     General bed mobility comments: HOB elevated to ~30 degrees, min assist to help ensure safe movment, assist at hips to scoot mostly, pt using bed rail for support at trunk.    Transfers Overall transfer level: Needs assistance Equipment used: None Transfers: Lateral/Scoot Transfers;Sit to/from Stand Sit to Stand: Mod assist;Min assist        Lateral/Scoot Transfers: Min assist;From elevated surface General transfer comment: Laterally scooted to the recliner chair to ensure safe transfer, PT mostly kept R LE NWB during lateral scooting with light assist at hips, after  we were in the recliner mod assist for first stand and min assist for two more stands with therapist helping to maintain NWB on R leg.  Ambulation/Gait                 Stairs             Wheelchair Mobility    Modified Rankin (Stroke Patients Only)       Balance Overall balance assessment: Needs assistance Sitting-balance support: Feet supported;Bilateral upper extremity supported;Single extremity supported;No upper extremity supported Sitting balance-Leahy Scale: Good     Standing balance support: Bilateral upper extremity supported Standing balance-Leahy Scale: Poor Standing balance comment: needs support of RW and therapist in standing due to NWB R LE                            Cognition Arousal/Alertness: Awake/alert Behavior During Therapy: WFL for tasks assessed/performed Overall Cognitive Status: Within Functional Limits for tasks assessed                                 General Comments: HOH, so decreased comprehension at times, needs repetition      Exercises General Exercises - Lower Extremity Ankle Circles/Pumps:  (toe wiggles right) Long Arc Quad: AROM;Both;10 reps Hip Flexion/Marching: AROM;Both;10 reps;Other (comment) (R hip flexion in standing x 10, both in sitting x10 ea)    General Comments  Pertinent Vitals/Pain Pain Assessment: Faces Faces Pain Scale: Hurts little more Pain Location: Rt ankle Pain Descriptors / Indicators: Burning Pain Intervention(s): Limited activity within patient's tolerance;Monitored during session;Repositioned    Home Living                      Prior Function            PT Goals (current goals can now be found in the care plan section) Acute Rehab PT Goals Patient Stated Goal: Get back home Progress towards PT goals: Progressing toward goals    Frequency    Min 3X/week      PT Plan Current plan remains appropriate    Co-evaluation               AM-PAC PT "6 Clicks" Mobility   Outcome Measure  Help needed turning from your back to your side while in a flat bed without using bedrails?: A Little Help needed moving from lying on your back to sitting on the side of a flat bed without using bedrails?: A Little Help needed moving to and from a bed to a chair (including a wheelchair)?: A Little Help needed standing up from a chair using your arms (e.g., wheelchair or bedside chair)?: A Lot Help needed to walk in hospital room?: A Lot Help needed climbing 3-5 steps with a railing? : Total 6 Click Score: 14    End of Session Equipment Utilized During Treatment: Gait belt Activity Tolerance: Patient tolerated treatment well Patient left: in chair;with call bell/phone within reach;with chair alarm set;with family/visitor present   PT Visit Diagnosis: Muscle weakness (generalized) (M62.81);History of falling (Z91.81);Difficulty in walking, not elsewhere classified (R26.2);Pain Pain - Right/Left: Right Pain - part of body: Ankle and joints of foot     Time: 1025-1049 PT Time Calculation (min) (ACUTE ONLY): 24 min  Charges:  $Therapeutic Exercise: 8-22 mins $Therapeutic Activity: 8-22 mins                    Verdene Lennert, PT, DPT  Acute Rehabilitation Ortho Tech Supervisor [phone] pager [phone]) [phone] office

## 2020-09-13 NOTE — Progress Notes (Signed)
PROGRESS NOTE    Megan Pitts  [id number] DOB: 1927/06/18 DOA: 09/10/2020 PCP: Ginger Organ., MD   Brief Narrative:  This 87 years old female with PMH significant for hypertension, aortic stenosis s/p AVR, hyperlipidemia, diastolic heart failure presented s/p mechanical fall.  X-ray found to have right ankle fracture.  Orthopedic surgery consulted.  Patient underwent open reduction and internal fixation of ankle fracture.  PT recommended SNF.  Assessment & Plan:   Principal Problem:   Closed right ankle fracture Active Problems:   Aortic stenosis   Hypertension   PVC's (premature ventricular contractions)   Hyperlipidemia   Chronic diastolic CHF (congestive heart failure) (HCC)   Leucocytosis   CKD (chronic kidney disease), stage II  Right ankle fracture: Patient presented s/p mechanical fall, twisted her ankle with resulting fracture. Orthopedic surgery consulted, underwent open reduction internal fixation. Continue not weightbearing right lower extremity, Lovenox for DVT prophylaxis and pain control. PT recommended skilled nursing facility  Hypertension: Continue amlodipine, HCTZ, irbesartan  Leukocytosis Could be reactive in the setting of fracture.  CKD stage II: Renal functions at baseline  Chronic diastolic heart failure: Appears euvolemic on exam,  Aortic stenosis: History of AVR, not on anticoagulation.   DVT prophylaxis: Lovenox Code Status: Full code Family Communication: No family at bedside Disposition Plan:   Status is: Inpatient  Remains inpatient appropriate because:Inpatient level of care appropriate due to severity of illness  Dispo: The patient is from: Home              Anticipated d/c is to: SNF              Patient currently is not medically stable to d/c.   Difficult to place patient No  Consultants:  Orthopedic surgery  Procedures: Open reduction internal fixation ankle fracture Antimicrobials:   Anti-infectives (From  admission, onward)    Start     Dose/Rate Route Frequency Ordered Stop   09/11/20 2200  ceFAZolin (ANCEF) IVPB 1 g/50 mL premix        1 g 100 mL/hr over 30 Minutes Intravenous Every 8 hours 09/11/20 2035 09/12/20 1446   09/11/20 1415  vancomycin (VANCOCIN) powder  Status:  Discontinued          As needed 09/11/20 1415 09/11/20 1528   09/11/20 1215  ceFAZolin (ANCEF) IVPB 2g/100 mL premix        2 g 200 mL/hr over 30 Minutes Intravenous To [facility] 09/11/20 0055 09/11/20 1341        Subjective: Patient was seen and examined at bedside.  Overnight events noted.   She reports feeling better, states having mild pain.  Able to move extremity.  Objective: Vitals:   09/12/20 1002 09/12/20 2130 09/13/20 0429 09/13/20 0844  BP: (!) 155/41 (!) 157/49 (!) 167/40 (!) 128/59  Pulse: 62 72 63 72  Resp: '18 18  18  '$ Temp: 98.2 F (36.8 C) 99 F (37.2 C) 97.9 F (36.6 C) 97.8 F (36.6 C)  TempSrc: Oral Oral Oral Axillary  SpO2: 98% 95% 93% 96%  Weight:      Height:        Intake/Output Summary (Last 24 hours) at 09/13/2020 1506 Last data filed at 09/13/2020 0300 Gross per 24 hour  Intake 370 ml  Output 850 ml  Net -480 ml   Filed Weights   09/10/20 1240  Weight: 72.6 kg    Examination:  General exam: Appears comfortable, not in any acute distress. Respiratory system: Clear to  auscultation bilaterally, respiratory effort normal. Cardiovascular system: S1-S2 heard, regular rate and rhythm, +murmur Gastrointestinal system: Abdomen is soft, nontender, nondistended, BS + Central nervous system: Alert and oriented X 3. No focal neurological deficits. Extremities: Right ankle in splint.  Tenderness noted. Skin: No rashes, lesions or ulcers Psychiatry: Judgement and insight appear normal. Mood & affect appropriate.     Data Reviewed: I have personally reviewed following labs and imaging studies  CBC: Recent Labs  Lab 09/10/20 1318 09/10/20 1330 09/11/20 0027  09/12/20 1015  WBC 13.5*  --  12.6* 10.9*  NEUTROABS 11.0*  --   --   --   HGB 14.4 14.6 13.2 14.0  HCT 43.0 43.0 38.9 42.2  MCV 89.4  --  88.6 89.6  PLT 375  --  350 [id number]   Basic Metabolic Panel: Recent Labs  Lab 09/10/20 1318 09/10/20 1330 09/11/20 0027 09/12/20 1015  NA 138 138 135 136  K 3.5 3.5 3.6 3.8  CL 103 108 101 99  CO2 24  --  27 25  GLUCOSE 132* 129* 101* 128*  BUN '21 22 18 14  '$ CREATININE 1.18* 1.00 1.03* 0.97  CALCIUM 9.2  --  8.7* 8.9   GFR: Estimated Creatinine Clearance: 37.2 mL/min (by C-G formula based on SCr of 0.97 mg/dL). Liver Function Tests: No results for input(s): AST, ALT, ALKPHOS, BILITOT, PROT, ALBUMIN in the last 168 hours. No results for input(s): LIPASE, AMYLASE in the last 168 hours. No results for input(s): AMMONIA in the last 168 hours. Coagulation Profile: No results for input(s): INR, PROTIME in the last 168 hours. Cardiac Enzymes: No results for input(s): CKTOTAL, CKMB, CKMBINDEX, TROPONINI in the last 168 hours. BNP (last 3 results) No results for input(s): PROBNP in the last 8760 hours. HbA1C: No results for input(s): HGBA1C in the last 72 hours. CBG: No results for input(s): GLUCAP in the last 168 hours. Lipid Profile: No results for input(s): CHOL, HDL, LDLCALC, TRIG, CHOLHDL, LDLDIRECT in the last 72 hours. Thyroid Function Tests: No results for input(s): TSH, T4TOTAL, FREET4, T3FREE, THYROIDAB in the last 72 hours. Anemia Panel: No results for input(s): VITAMINB12, FOLATE, FERRITIN, TIBC, IRON, RETICCTPCT in the last 72 hours. Sepsis Labs: No results for input(s): PROCALCITON, LATICACIDVEN in the last 168 hours.  Recent Results (from the past 240 hour(s))  Resp Panel by RT-PCR (Flu A&B, Covid) Nasopharyngeal Swab     Status: None   Collection Time: 09/10/20  1:55 PM   Specimen: Nasopharyngeal Swab; Nasopharyngeal(NP) swabs in vial transport medium  Result Value Ref Range Status   SARS Coronavirus 2 by RT PCR NEGATIVE  NEGATIVE Final    Comment: (NOTE) SARS-CoV-2 target nucleic acids are NOT DETECTED.  The SARS-CoV-2 RNA is generally detectable in upper respiratory specimens during the acute phase of infection. The lowest concentration of SARS-CoV-2 viral copies this assay can detect is 138 copies/mL. A negative result does not preclude SARS-Cov-2 infection and should not be used as the sole basis for treatment or other patient management decisions. A negative result may occur with  improper specimen collection/handling, submission of specimen other than nasopharyngeal swab, presence of viral mutation(s) within the areas targeted by this assay, and inadequate number of viral copies(<138 copies/mL). A negative result must be combined with clinical observations, patient history, and epidemiological information. The expected result is Negative.  Fact Sheet for Patients:  EntrepreneurPulse.com.au  Fact Sheet for Healthcare Providers:  IncredibleEmployment.be  This test is no t yet approved or cleared by the [place]  States FDA and  has been authorized for detection and/or diagnosis of SARS-CoV-2 by FDA under an Emergency Use Authorization (EUA). This EUA will remain  in effect (meaning this test can be used) for the duration of the COVID-19 declaration under Section 564(b)(1) of the Act, 21 U.S.C.section 360bbb-3(b)(1), unless the authorization is terminated  or revoked sooner.       Influenza A by PCR NEGATIVE NEGATIVE Final   Influenza B by PCR NEGATIVE NEGATIVE Final    Comment: (NOTE) The Xpert Xpress SARS-CoV-2/FLU/RSV plus assay is intended as an aid in the diagnosis of influenza from Nasopharyngeal swab specimens and should not be used as a sole basis for treatment. Nasal washings and aspirates are unacceptable for Xpert Xpress SARS-CoV-2/FLU/RSV testing.  Fact Sheet for Patients: EntrepreneurPulse.com.au  Fact Sheet for Healthcare  Providers: IncredibleEmployment.be  This test is not yet approved or cleared by the [place] FDA and has been authorized for detection and/or diagnosis of SARS-CoV-2 by FDA under an Emergency Use Authorization (EUA). This EUA will remain in effect (meaning this test can be used) for the duration of the COVID-19 declaration under Section 564(b)(1) of the Act, 21 U.S.C. section 360bbb-3(b)(1), unless the authorization is terminated or revoked.  Performed at [facility], [place] [address] [place], [place] [address]   Surgical pcr screen     Status: None   Collection Time: 09/10/20  9:36 PM   Specimen: Nasal Mucosa; Nasal Swab  Result Value Ref Range Status   MRSA, PCR NEGATIVE NEGATIVE Final   Staphylococcus aureus NEGATIVE NEGATIVE Final    Comment: (NOTE) The Xpert SA Assay (FDA approved for NASAL specimens in patients 71 years of age and older), is one component of a comprehensive surveillance program. It is not intended to diagnose infection nor to guide or monitor treatment. Performed at [facility], [place] [address], [place], [place] [address]     Radiology Studies: DG Ankle Right Port  Result Date: 09/11/2020 CLINICAL DATA:  Postop right ankle fracture. EXAM: PORTABLE RIGHT ANKLE - 2 VIEW COMPARISON:  Preoperative imaging yesterday. FINDINGS: Two screws traverse the medial malleolar fracture in improved alignment. Lateral plate and multi screw fixation of distal fibular fracture with syndesmotic and inter fragmentary screw. Fibular fracture is in improved alignment. Posterior tibial tubercle fracture is obscured on the current exam due to overlying hardware. Improved ankle mortise alignment. Overlying cast material in place. IMPRESSION: ORIF distal tibia and fibular fractures without immediate postoperative complication. Improved fracture and ankle mortise alignment from preoperative exams. Electronically Signed   By: Keith Rake M.D.   On:  09/11/2020 16:10     Scheduled Meds:  amLODipine  10 mg Oral Daily   docusate sodium  100 mg Oral BID   donepezil  10 mg Oral QHS   [START ON 09/14/2020] enoxaparin (LOVENOX) injection  40 mg Subcutaneous Q24H   etomidate  10 mg Intravenous Once   gabapentin  100 mg Oral QHS   hydrochlorothiazide  25 mg Oral Daily   irbesartan  300 mg Oral Daily   sodium chloride flush  3 mL Intravenous Q12H   Continuous Infusions:  sodium chloride       LOS: 3 days    Time spent: 25 mins    Jager Koska, MD [facility]   If 7PM-7AM, please contact night-coverage

## 2020-09-13 NOTE — TOC CAGE-AID Note (Deleted)
Transition of Care [facility]) - CAGE-AID Screening   Patient Details  Name: Megan Pitts MRN: [id number] Date of Birth: 1927/07/21  [facility]) CM/SW Contact:    Shenia Alan C Tarpley-Carter, [place] Phone Number: 09/13/2020, 10:55 AM   Clinical Narrative: Pt is unable to participate in Cage Aid. Pt is inappropriate for assessment.  Freedom Peddy Tarpley-Carter, MSW, LCSW-A Pronouns:  She/Her/Hers [facility] HealthTransitions of Care Clinical Social Worker Direct Number:  [phone] [email or web address]   CAGE-AID Screening:

## 2020-09-13 NOTE — Plan of Care (Signed)
  Problem: Education: Goal: Knowledge of General Education information will improve Description: Including pain rating scale, medication(s)/side effects and non-pharmacologic comfort measures Outcome: Progressing   Problem: Health Behavior/Discharge Planning: Goal: Ability to manage health-related needs will improve Outcome: Progressing   Problem: Clinical Measurements: Goal: Will remain free from infection Outcome: Progressing   Problem: Clinical Measurements: Goal: Respiratory complications will improve Outcome: Progressing   Problem: Clinical Measurements: Goal: Cardiovascular complication will be avoided Outcome: Progressing   Problem: Activity: Goal: Risk for activity intolerance will decrease Outcome: Progressing   Problem: Nutrition: Goal: Adequate nutrition will be maintained Outcome: Progressing   Problem: Elimination: Goal: Will not experience complications related to urinary retention Outcome: Progressing   Problem: Safety: Goal: Ability to remain free from injury will improve Outcome: Progressing   Problem: Skin Integrity: Goal: Risk for impaired skin integrity will decrease Outcome: Progressing

## 2020-09-13 NOTE — Progress Notes (Addendum)
Subjective: 2 Days Post-Op s/p Procedure(s): OPEN REDUCTION INTERNAL FIXATION (ORIF) ANKLE FRACTURE   Patient is alert, oriented. Very frustrated that she is unable to do her normal regimen due to this injury including morning shower, morning BM, and walking.  Patient reports pain as moderate, improves with pain medication, burning sensation has improved since adding on gabapentin. Has not had a BM in multiple days, she does use miralax at home from time to time.  Denies chest pain, SOB, Calf pain. No nausea/vomiting.    Objective:  PE: VITALS:   Vitals:   09/12/20 0250 09/12/20 1002 09/12/20 2130 09/13/20 0429  BP: (!) 156/56 (!) 155/41 (!) 157/49 (!) 167/40  Pulse: 67 62 72 63  Resp: '14 18 18   '$ Temp: 98.8 F (37.1 C) 98.2 F (36.8 C) 99 F (37.2 C) 97.9 F (36.6 C)  TempSrc: Oral Oral Oral Oral  SpO2: 96% 98% 95% 93%  Weight:      Height:       General: sitting up in bed, in no acute distress GI: abdomen nontender, soft MSK: RLE in splint. Able to flex and extend all toes. Toes warm and well perfused. Sensation intact to all toes. Able to perform straight leg raise.   LABS  Results for orders placed or performed during the hospital encounter of 09/10/20 (from the past 24 hour(s))  CBC     Status: Abnormal   Collection Time: 09/12/20 10:15 AM  Result Value Ref Range   WBC 10.9 (H) 4.0 - 10.5 K/uL   RBC 4.71 3.87 - 5.11 MIL/uL   Hemoglobin 14.0 12.0 - 15.0 g/dL   HCT 42.2 36.0 - 46.0 %   MCV 89.6 80.0 - 100.0 fL   MCH 29.7 26.0 - 34.0 pg   MCHC 33.2 30.0 - 36.0 g/dL   RDW 12.7 11.5 - 15.5 %   Platelets 311 150 - 400 K/uL   nRBC 0.0 0.0 - 0.2 %  Basic metabolic panel     Status: Abnormal   Collection Time: 09/12/20 10:15 AM  Result Value Ref Range   Sodium 136 135 - 145 mmol/L   Potassium 3.8 3.5 - 5.1 mmol/L   Chloride 99 98 - 111 mmol/L   CO2 25 22 - 32 mmol/L   Glucose, Bld 128 (H) 70 - 99 mg/dL   BUN 14 8 - 23 mg/dL   Creatinine, Ser 0.97 0.44 - 1.00  mg/dL   Calcium 8.9 8.9 - 10.3 mg/dL   GFR, Estimated 56 (L) >60 mL/min   Anion gap 12 5 - 15    DG Ankle Right Port  Result Date: 09/11/2020 CLINICAL DATA:  Postop right ankle fracture. EXAM: PORTABLE RIGHT ANKLE - 2 VIEW COMPARISON:  Preoperative imaging yesterday. FINDINGS: Two screws traverse the medial malleolar fracture in improved alignment. Lateral plate and multi screw fixation of distal fibular fracture with syndesmotic and inter fragmentary screw. Fibular fracture is in improved alignment. Posterior tibial tubercle fracture is obscured on the current exam due to overlying hardware. Improved ankle mortise alignment. Overlying cast material in place. IMPRESSION: ORIF distal tibia and fibular fractures without immediate postoperative complication. Improved fracture and ankle mortise alignment from preoperative exams. Electronically Signed   By: Keith Rake M.D.   On: 09/11/2020 16:10   DG MINI C-ARM IMAGE ONLY  Result Date: 09/11/2020 There is no interpretation for this exam.  This order is for images obtained during a surgical procedure.  Please See "Surgeries" Tab for more information  regarding the procedure.    Assessment/Plan: Principal Problem:   Closed right ankle fracture Active Problems:   Aortic stenosis   Hypertension   PVC's (premature ventricular contractions)   Hyperlipidemia   Chronic diastolic CHF (congestive heart failure) (HCC)   Leucocytosis   CKD (chronic kidney disease), stage II  Right ankle fracture 2 Days Post-Op s/p Procedure(s): OPEN REDUCTION INTERNAL FIXATION (ORIF) ANKLE FRACTURE - added a miralax dose this morning for constipation  Weightbearing: NWB RLE Orthopedic device(s): Splint VTE prophylaxis: lovenox   40 mg/day until mobilizing Pain control: Hydrocodone 5/325, 7/325, IV dilaudid, tylenol, low dose gabapentin for burning pain Dispo: PT and OT recommending SNF, TOC on board for SNF placement  Contact information:   Weekdays 8-5  Merlene Pulling, [place] [phone] A fter hours and holidays please check [email or web address] for group call information for [facility]  Ventura Bruns 09/13/2020, 7:23 AM

## 2020-09-13 NOTE — NC FL2 (Signed)
[place] LEVEL OF CARE SCREENING TOOL     IDENTIFICATION  Patient Name: Megan Pitts Birthdate: [birth year] Sex: female Admission Date (Current Location): 09/10/2020  [facility] and [place] Number:  Herbalist and Address:  [facility], [place] [address] [place], [place] [address]      Provider Number: [id number]  Attending Physician Name and Address:  Shawna Clamp, MD  Relative Name and Phone Number:       Current Level of Care: Hospital Recommended Level of Care: [place] Prior Approval Number:    Date Approved/Denied:   PASRR Number: [id number]  Discharge Plan: SNF    Current Diagnoses: Patient Active Problem List   Diagnosis Date Noted   Closed right ankle fracture 09/10/2020   CKD (chronic kidney disease), stage II 09/10/2020   Cervical spine fracture ([place]) 06/03/2019   Syncope, vasovagal 09/27/2017   Syncope and collapse 09/26/2017   Fracture dislocation of right shoulder joint 09/24/2017   Shoulder fracture, right 09/24/2017   Chronic diastolic CHF (congestive heart failure) ([place]) 09/24/2017   Leucocytosis 09/24/2017   Cellulitis 05/05/2015   S/P AVR 10/26/2010   Aortic stenosis    Hypertension    PVC's (premature ventricular contractions)    Hyperlipidemia     Orientation RESPIRATION BLADDER Height & Weight     Self, Time, Situation, Place  Normal Continent, Incontinent Weight: 160 lb (72.6 kg) Height:  5' 3.5" (161.3 cm)  BEHAVIORAL SYMPTOMS/MOOD NEUROLOGICAL BOWEL NUTRITION STATUS      Continent Diet (regular)  AMBULATORY STATUS COMMUNICATION OF NEEDS Skin   Extensive Assist Verbally Surgical wounds (right leg, compression wrap dressing)                       Personal Care Assistance Level of Assistance  Bathing, Feeding, Dressing Bathing Assistance: Limited assistance Feeding assistance: Independent Dressing Assistance: Limited assistance     Functional Limitations Info   Hearing   Hearing Info: Impaired (hearing aids)      [place]  PT (By licensed PT), OT (By licensed OT)     PT Frequency: 5x/wk OT Frequency: 5x/wk            Contractures Contractures Info: Not present    Additional Factors Info  Code Status, Allergies, Psychotropic Code Status Info: Full Allergies Info: Synthroid Psychotropic Info: Aricept '10mg'$  daily at bed         Current Medications (09/13/2020):  This is the current hospital active medication list Current Facility-Administered Medications  Medication Dose Route Frequency Provider Last Rate Last Admin   0.9 %  sodium chloride infusion  250 mL Intravenous PRN McBane, Maylene Roes, PA-C       amLODipine (NORVASC) tablet 10 mg  10 mg Oral Daily Ethelda Chick, PA-C   10 mg at 09/13/20 0849   diphenhydrAMINE (BENADRYL) 12.5 MG/5ML elixir 12.5-25 mg  12.5-25 mg Oral Q4H PRN McBane, Maylene Roes, PA-C       docusate sodium (COLACE) capsule 100 mg  100 mg Oral BID Ethelda Chick, PA-C   100 mg at 09/13/20 0849   donepezil (ARICEPT) tablet 10 mg  10 mg Oral QHS Ethelda Chick, PA-C   10 mg at 09/12/20 2007   enoxaparin (LOVENOX) injection 30 mg  30 mg Subcutaneous Q24H McBane, Caroline N, PA-C   30 mg at 09/13/20 0831   etomidate (AMIDATE) injection 10 mg  10 mg Intravenous Once Fredia Sorrow, MD  gabapentin (NEURONTIN) capsule 100 mg  100 mg Oral QHS Merlene Pulling K, PA-C   100 mg at 09/12/20 2007   hydrALAZINE (APRESOLINE) injection 5 mg  5 mg Intravenous Q6H PRN McBane, Maylene Roes, PA-C       hydrochlorothiazide (HYDRODIURIL) tablet 25 mg  25 mg Oral Daily Ethelda Chick, PA-C   25 mg at 09/13/20 0849   HYDROcodone-acetaminophen (NORCO) 7.5-325 MG per tablet 1 tablet  1 tablet Oral Q4H PRN Ethelda Chick, PA-C   1 tablet at 09/13/20 0158   HYDROcodone-acetaminophen (NORCO/VICODIN) 5-325 MG per tablet 1 tablet  1 tablet Oral Q4H PRN McBane, Maylene Roes, PA-C       irbesartan  (AVAPRO) tablet 300 mg  300 mg Oral Daily Ethelda Chick, PA-C   300 mg at 09/13/20 0849   morphine 2 MG/ML injection 0.5-1 mg  0.5-1 mg Intravenous Q2H PRN McBane, Caroline N, PA-C       ondansetron (ZOFRAN) tablet 4 mg  4 mg Oral Q6H PRN McBane, Maylene Roes, PA-C       Or   ondansetron (ZOFRAN) injection 4 mg  4 mg Intravenous Q6H PRN McBane, Caroline N, PA-C       sodium chloride flush (NS) 0.9 % injection 3 mL  3 mL Intravenous Q12H McBane, Maylene Roes, PA-C   3 mL at 09/13/20 [id number]   sodium chloride flush (NS) 0.9 % injection 3 mL  3 mL Intravenous PRN Ethelda Chick, PA-C         Discharge Medications: Please see discharge summary for a list of discharge medications.  Relevant Imaging Results:  Relevant Lab Results:   Additional Information SS#: [id number]  Geralynn Ochs, LCSW

## 2020-09-13 NOTE — TOC Initial Note (Signed)
Transition of Care [facility]) - Initial/Assessment Note    Patient Details  Name: Megan Pitts MRN: [id number] Date of Birth: March 23, 1927  [facility]) CM/SW Contact:    Geralynn Ochs, LCSW Phone Number: 09/13/2020, 10:21 AM  Clinical Narrative:      Patient from home alone, agreeable to SNF placement, preference for [facility] [place]. CSW completed referral and faxed out, asked [facility] and [facility] to review. Bed offers pending. CSW to follow.             Expected Discharge Plan: Skilled Nursing Facility Barriers to Discharge: Continued Medical Work up, SNF Pending bed offer   Patient Goals and CMS Choice Patient states their goals for this hospitalization and ongoing recovery are:: to get well CMS Medicare.gov Compare Post Acute Care list provided to:: Patient Choice offered to / list presented to : Patient  Expected Discharge Plan and Services Expected Discharge Plan: [place] Choice: [place] Living arrangements for the past 2 months: Single Family Home                                      Prior Living Arrangements/Services Living arrangements for the past 2 months: Single Family Home Lives with:: Self Patient language and need for interpreter reviewed:: No Do you feel safe going back to the place where you live?: Yes      Need for Family Participation in Patient Care: No (Comment) Care giver support system in place?: No (comment)   Criminal Activity/Legal Involvement Pertinent to Current Situation/Hospitalization: No - Comment as needed  Activities of Daily Living Home Assistive Devices/Equipment: Eyeglasses ADL Screening (condition at time of admission) Patient's cognitive ability adequate to safely complete daily activities?: Yes Is the patient deaf or have difficulty hearing?: Yes Does the patient have difficulty seeing, even when wearing glasses/contacts?: No Does the patient have  difficulty concentrating, remembering, or making decisions?: No Patient able to express need for assistance with ADLs?: Yes Does the patient have difficulty dressing or bathing?: No Independently performs ADLs?: Yes (appropriate for developmental age) Does the patient have difficulty walking or climbing stairs?: No Weakness of Legs: None Weakness of Arms/Hands: None  Permission Sought/Granted Permission sought to share information with : Facility Sport and exercise psychologist, Family Supports Permission granted to share information with : Yes, Verbal Permission Granted  Share Information with NAME: Patty  Permission granted to share info w AGENCY: SNF  Permission granted to share info w Relationship: Daughter     Emotional Assessment Appearance:: Appears stated age Attitude/Demeanor/Rapport: Engaged Affect (typically observed): Appropriate Orientation: : Oriented to Self, Oriented to Place, Oriented to  Time, Oriented to Situation Alcohol / Substance Use: Not Applicable Psych Involvement: No (comment)  Admission diagnosis:  Ankle fracture [S82.899A] Closed right ankle fracture [S82.891A] Closed fracture of right ankle, initial encounter [S82.891A] Patient Active Problem List   Diagnosis Date Noted   Closed right ankle fracture 09/10/2020   CKD (chronic kidney disease), stage II 09/10/2020   Cervical spine fracture ([place]) 06/03/2019   Syncope, vasovagal 09/27/2017   Syncope and collapse 09/26/2017   Fracture dislocation of right shoulder joint 09/24/2017   Shoulder fracture, right 09/24/2017   Chronic diastolic CHF (congestive heart failure) ([place]) 09/24/2017   Leucocytosis 09/24/2017   Cellulitis 05/05/2015   S/P AVR 10/26/2010   Aortic stenosis    Hypertension    PVC's (  premature ventricular contractions)    Hyperlipidemia    PCP:  Ginger Organ., MD Pharmacy:   [place], [place]  [address] Phone: [phone] Fax: [phone]     Social Determinants of Health (SDOH) Interventions    Readmission Risk Interventions Readmission Risk Prevention Plan 06/04/2019  Post Dischage Appt Not Complete  Appt Comments pending disposition/likely SNF  Medication Screening Complete  Transportation Screening Complete  Some recent data might be hidden

## 2020-09-14 ENCOUNTER — Encounter (HOSPITAL_COMMUNITY): Payer: Self-pay | Admitting: Orthopaedic Surgery

## 2020-09-14 DIAGNOSIS — S82891A Other fracture of right lower leg, initial encounter for closed fracture: Secondary | ICD-10-CM | POA: Diagnosis not present

## 2020-09-14 MED ORDER — HYDROCODONE-ACETAMINOPHEN 5-325 MG PO TABS: 1.0000 | ORAL_TABLET | Freq: Four times a day (QID) | ORAL | 0 refills | Status: DC | PRN

## 2020-09-14 MED ORDER — ENOXAPARIN SODIUM 40 MG/0.4ML IJ SOSY: 40.0000 mg | PREFILLED_SYRINGE | INTRAMUSCULAR | 0 refills | Status: DC

## 2020-09-14 NOTE — Progress Notes (Signed)
PROGRESS NOTE    Megan Pitts  [id number] DOB: May 04, 1927 DOA: 09/10/2020 PCP: Ginger Organ., MD   Brief Narrative:  This 87 years old female with PMH significant for hypertension, aortic stenosis s/p AVR, hyperlipidemia, diastolic heart failure presented s/p mechanical fall.  X-ray found to have right ankle fracture.  Orthopedic surgery consulted.  Patient underwent open reduction and internal fixation of ankle fracture.  PT recommended SNF.  Assessment & Plan:   Principal Problem:   Closed right ankle fracture Active Problems:   Aortic stenosis   Hypertension   PVC's (premature ventricular contractions)   Hyperlipidemia   Chronic diastolic CHF (congestive heart failure) (HCC)   Leucocytosis   CKD (chronic kidney disease), stage II  Right ankle fracture: Patient presented s/p mechanical fall, twisted her ankle with resulting fracture. Orthopedic surgery consulted, underwent open reduction internal fixation. Continue not weightbearing right lower extremity, Lovenox for DVT prophylaxis and pain control. PT recommended skilled nursing facility, pending insurance authorization.  Hypertension: Continue amlodipine, HCTZ, irbesartan  Leukocytosis > Improving. Could be reactive in the setting of fracture.  CKD stage II: Renal functions at baseline  Chronic diastolic heart failure: Appears euvolemic on exam,  Aortic stenosis: History of AVR, not on anticoagulation.   DVT prophylaxis: Lovenox Code Status: Full code Family Communication: No family at bedside Disposition Plan:   Status is: Inpatient  Remains inpatient appropriate because:Inpatient level of care appropriate due to severity of illness  Dispo: The patient is from: Home              Anticipated d/c is to: SNF              Patient currently is not medically stable to d/c.   Difficult to place patient No  Consultants:  Orthopedic surgery  Procedures: Open reduction internal fixation ankle  fracture Antimicrobials:   Anti-infectives (From admission, onward)    Start     Dose/Rate Route Frequency Ordered Stop   09/11/20 2200  ceFAZolin (ANCEF) IVPB 1 g/50 mL premix        1 g 100 mL/hr over 30 Minutes Intravenous Every 8 hours 09/11/20 2035 09/12/20 1446   09/11/20 1415  vancomycin (VANCOCIN) powder  Status:  Discontinued          As needed 09/11/20 1415 09/11/20 1528   09/11/20 1215  ceFAZolin (ANCEF) IVPB 2g/100 mL premix        2 g 200 mL/hr over 30 Minutes Intravenous To [facility] 09/11/20 0055 09/11/20 1341        Subjective: Patient was seen and examined at bedside.  Overnight events noted.   Patient reports feeling better, reports having mild pain and able to move toes. She is awaiting insurance authorization for SNF placement.  Objective: Vitals:   09/13/20 1551 09/13/20 2027 09/14/20 0900 09/14/20 0901  BP: (!) 143/64 (!) 161/62 (!) 143/51 (!) 138/43  Pulse: 73 79 75 71  Resp: '18 19 17 16  '$ Temp: 98.2 F (36.8 C) 98.2 F (36.8 C) 97.9 F (36.6 C) 97.9 F (36.6 C)  TempSrc: Oral Oral Oral Oral  SpO2: 97% 100% 100% 97%  Weight:      Height:        Intake/Output Summary (Last 24 hours) at 09/14/2020 1233 Last data filed at 09/14/2020 1153 Gross per 24 hour  Intake 480 ml  Output 1400 ml  Net -920 ml   Filed Weights   09/10/20 1240  Weight: 72.6 kg    Examination:  General exam: Appears comfortable, not in any acute distress.   Respiratory system: Clear to auscultation bilaterally, respiratory effort normal. Cardiovascular system: S1-S2 heard, regular rate and rhythm, +murmur Gastrointestinal system: Abdomen is soft, nontender, nondistended, BS + Central nervous system: Alert and oriented X 3. No focal neurological deficits. Extremities: Right ankle in splint.  Tenderness noted.  Able to move toes. Skin: No rashes, lesions or ulcers Psychiatry: Judgement and insight appear normal. Mood & affect appropriate.     Data Reviewed: I  have personally reviewed following labs and imaging studies  CBC: Recent Labs  Lab 09/10/20 1318 09/10/20 1330 09/11/20 0027 09/12/20 1015  WBC 13.5*  --  12.6* 10.9*  NEUTROABS 11.0*  --   --   --   HGB 14.4 14.6 13.2 14.0  HCT 43.0 43.0 38.9 42.2  MCV 89.4  --  88.6 89.6  PLT 375  --  350 [id number]   Basic Metabolic Panel: Recent Labs  Lab 09/10/20 1318 09/10/20 1330 09/11/20 0027 09/12/20 1015  NA 138 138 135 136  K 3.5 3.5 3.6 3.8  CL 103 108 101 99  CO2 24  --  27 25  GLUCOSE 132* 129* 101* 128*  BUN '21 22 18 14  '$ CREATININE 1.18* 1.00 1.03* 0.97  CALCIUM 9.2  --  8.7* 8.9   GFR: Estimated Creatinine Clearance: 37.2 mL/min (by C-G formula based on SCr of 0.97 mg/dL). Liver Function Tests: No results for input(s): AST, ALT, ALKPHOS, BILITOT, PROT, ALBUMIN in the last 168 hours. No results for input(s): LIPASE, AMYLASE in the last 168 hours. No results for input(s): AMMONIA in the last 168 hours. Coagulation Profile: No results for input(s): INR, PROTIME in the last 168 hours. Cardiac Enzymes: No results for input(s): CKTOTAL, CKMB, CKMBINDEX, TROPONINI in the last 168 hours. BNP (last 3 results) No results for input(s): PROBNP in the last 8760 hours. HbA1C: No results for input(s): HGBA1C in the last 72 hours. CBG: No results for input(s): GLUCAP in the last 168 hours. Lipid Profile: No results for input(s): CHOL, HDL, LDLCALC, TRIG, CHOLHDL, LDLDIRECT in the last 72 hours. Thyroid Function Tests: No results for input(s): TSH, T4TOTAL, FREET4, T3FREE, THYROIDAB in the last 72 hours. Anemia Panel: No results for input(s): VITAMINB12, FOLATE, FERRITIN, TIBC, IRON, RETICCTPCT in the last 72 hours. Sepsis Labs: No results for input(s): PROCALCITON, LATICACIDVEN in the last 168 hours.  Recent Results (from the past 240 hour(s))  Resp Panel by RT-PCR (Flu A&B, Covid) Nasopharyngeal Swab     Status: None   Collection Time: 09/10/20  1:55 PM   Specimen: Nasopharyngeal  Swab; Nasopharyngeal(NP) swabs in vial transport medium  Result Value Ref Range Status   SARS Coronavirus 2 by RT PCR NEGATIVE NEGATIVE Final    Comment: (NOTE) SARS-CoV-2 target nucleic acids are NOT DETECTED.  The SARS-CoV-2 RNA is generally detectable in upper respiratory specimens during the acute phase of infection. The lowest concentration of SARS-CoV-2 viral copies this assay can detect is 138 copies/mL. A negative result does not preclude SARS-Cov-2 infection and should not be used as the sole basis for treatment or other patient management decisions. A negative result may occur with  improper specimen collection/handling, submission of specimen other than nasopharyngeal swab, presence of viral mutation(s) within the areas targeted by this assay, and inadequate number of viral copies(<138 copies/mL). A negative result must be combined with clinical observations, patient history, and epidemiological information. The expected result is Negative.  Fact Sheet for Patients:  EntrepreneurPulse.com.au  Fact Sheet for Healthcare Providers:  IncredibleEmployment.be  This test is no t yet approved or cleared by the [place] FDA and  has been authorized for detection and/or diagnosis of SARS-CoV-2 by FDA under an Emergency Use Authorization (EUA). This EUA will remain  in effect (meaning this test can be used) for the duration of the COVID-19 declaration under Section 564(b)(1) of the Act, 21 U.S.C.section 360bbb-3(b)(1), unless the authorization is terminated  or revoked sooner.       Influenza A by PCR NEGATIVE NEGATIVE Final   Influenza B by PCR NEGATIVE NEGATIVE Final    Comment: (NOTE) The Xpert Xpress SARS-CoV-2/FLU/RSV plus assay is intended as an aid in the diagnosis of influenza from Nasopharyngeal swab specimens and should not be used as a sole basis for treatment. Nasal washings and aspirates are unacceptable for Xpert Xpress  SARS-CoV-2/FLU/RSV testing.  Fact Sheet for Patients: EntrepreneurPulse.com.au  Fact Sheet for Healthcare Providers: IncredibleEmployment.be  This test is not yet approved or cleared by the [place] FDA and has been authorized for detection and/or diagnosis of SARS-CoV-2 by FDA under an Emergency Use Authorization (EUA). This EUA will remain in effect (meaning this test can be used) for the duration of the COVID-19 declaration under Section 564(b)(1) of the Act, 21 U.S.C. section 360bbb-3(b)(1), unless the authorization is terminated or revoked.  Performed at [facility], [place] [address], [place], [place] [address]   Surgical pcr screen     Status: None   Collection Time: 09/10/20  9:36 PM   Specimen: Nasal Mucosa; Nasal Swab  Result Value Ref Range Status   MRSA, PCR NEGATIVE NEGATIVE Final   Staphylococcus aureus NEGATIVE NEGATIVE Final    Comment: (NOTE) The Xpert SA Assay (FDA approved for NASAL specimens in patients 56 years of age and older), is one component of a comprehensive surveillance program. It is not intended to diagnose infection nor to guide or monitor treatment. Performed at [facility], [place] [address] [place], [place] [address]     Radiology Studies: No results found.   Scheduled Meds:  amLODipine  10 mg Oral Daily   docusate sodium  100 mg Oral BID   donepezil  10 mg Oral QHS   enoxaparin (LOVENOX) injection  40 mg Subcutaneous Q24H   etomidate  10 mg Intravenous Once   gabapentin  100 mg Oral QHS   hydrochlorothiazide  25 mg Oral Daily   irbesartan  300 mg Oral Daily   sodium chloride flush  3 mL Intravenous Q12H   Continuous Infusions:  sodium chloride       LOS: 4 days    Time spent: 25 mins    Shawna Clamp, MD [facility]   If 7PM-7AM, please contact night-coverage

## 2020-09-14 NOTE — Progress Notes (Signed)
     Subjective: 3 Days Post-Op s/p Procedure(s): OPEN REDUCTION INTERNAL FIXATION (ORIF) ANKLE FRACTURE  Patient reports pain as moderate, improves with pain medication, burning sensation has improved since adding on gabapentin. PT recommending SNF. Denies chest pain, SOB, Calf pain. No nausea/vomiting.    Objective:  PE: VITALS:   Vitals:   09/13/20 0429 09/13/20 0844 09/13/20 1551 09/13/20 2027  BP: (!) 167/40 (!) 128/59 (!) 143/64 (!) 161/62  Pulse: 63 72 73 79  Resp:  '18 18 19  '$ Temp: 97.9 F (36.6 C) 97.8 F (36.6 C) 98.2 F (36.8 C) 98.2 F (36.8 C)  TempSrc: Oral Axillary Oral Oral  SpO2: 93% 96% 97% 100%  Weight:      Height:       General: sitting up in bed, in no acute distress GI: abdomen nontender, soft MSK: splint CDI, +EHL though remainder of motor difficult to test due to splint, sensation intact distally with warm well perfused foot, no pain w passive stretch   LABS  No results found for this or any previous visit (from the past 24 hour(s)).   No results found.  Assessment/Plan: Principal Problem:   Closed right ankle fracture Active Problems:   Aortic stenosis   Hypertension   PVC's (premature ventricular contractions)   Hyperlipidemia   Chronic diastolic CHF (congestive heart failure) (HCC)   Leucocytosis   CKD (chronic kidney disease), stage II  Right ankle fracture 3 Days Post-Op s/p Procedure(s): OPEN REDUCTION INTERNAL FIXATION (ORIF) ANKLE FRACTURE - added a miralax yesterday for constipation  Weightbearing: NWB RLE Orthopedic device(s): Splint VTE prophylaxis: lovenox   40 mg/day until mobilizing Pain control: Hydrocodone 5/325, 7/325, IV dilaudid, tylenol, low dose gabapentin for burning pain Dispo: PT and OT recommending SNF, TOC on board for SNF placement  Cleared for discharge from orthopedics standpoint once cleared by medicine team and therapies. Discharge medications printed and placed in patient's chart.    Contact  information:   Dr. Ophelia Charter, [place], After hours and holidays please check [email or web address] for group call information for [place] 09/14/2020, 7:43 AM

## 2020-09-14 NOTE — TOC Progression Note (Signed)
Transition of Care [facility]) - Progression Note    Patient Details  Name: Megan Pitts MRN: [id number] Date of Birth: [birth year]  [facility]) CM/SW Contact  Emeterio Reeve, [place] Phone Number: 09/14/2020, 1:47 PM  Clinical Narrative:     CSW gave pt bed offers with daughter [name] joining by phone. Patty and pt decided on [facility] can accept pt on Thrusday. CSW requested covid test. CSW will follow for updates.   Expected Discharge Plan: [place] Barriers to Discharge: Continued Medical Work up, SNF Pending bed offer  Expected Discharge Plan and Services Expected Discharge Plan: [place] Choice: [place] arrangements for the past 2 months: Single Family Home                                       Social Determinants of Health (SDOH) Interventions    Readmission Risk Interventions Readmission Risk Prevention Plan 06/04/2019  Post Dischage Appt Not Complete  Appt Comments pending disposition/likely SNF  Medication Screening Complete  Transportation Screening Complete  Some recent data might be hidden   Emeterio Reeve, LCSW Clinical Social Worker

## 2020-09-14 NOTE — Discharge Instructions (Signed)
Ophelia Charter MD, MPH [place] [address] [phone] (tel)   [phone] (fax)   POST-OPERATIVE INSTRUCTIONS - LOWER EXTREMITY   WOUND CARE Please keep splint clean dry and intact until followup.  You may shower on Post-Op Day #2.  You must keep splint dry during this process and may find that a plastic bag taped around the leg or alternatively a towel based bath may be a better option.   If you get your splint wet or if it is damaged please contact our clinic.  EXERCISES Due to your splint being in place you will not be able to bear weight through your extremity.   DO NOT PUT ANY WEIGHT ON YOUR OPERATIVE LEG Please use crutches or a walker to avoid weight bearing.   REGIONAL ANESTHESIA (NERVE BLOCKS) The anesthesia team may have performed a nerve block for you if safe in the setting of your care.  This is a great tool used to minimize pain.  Typically the block may start wearing off overnight but the long acting medicine may last for 3-4 days.  The nerve block wearing off can be a challenging period but please utilize your as needed pain medications to try and manage this period.    POST-OP MEDICATIONS- Multimodal approach to pain control  In general your pain will be controlled with a combination of substances.  Prescriptions unless otherwise discussed are electronically sent to your pharmacy.  This is a carefully made plan we use to minimize narcotic use.       - Acetaminophen - Non-narcotic pain medicine taken on a scheduled basis     - Do not take more than 4,000 mg of Tylenol daily  - Norco (hydrocodone-acetaminophen) - This is a strong narcotic, to be used only on an "as needed" basis for SEVERE pain.  -  Lovenox - This medicine is used to minimize the risk of blood clots after surgery.             -         FOLLOW-UP If you develop a Fever (>101.5), Redness or Drainage from the surgical incision site, please call our office to  arrange for an evaluation. Please call the office to schedule a follow-up appointment for your incision check if you do not already have one, 7-10 days post-operatively.  IF YOU HAVE ANY QUESTIONS, PLEASE FEEL FREE TO CALL OUR OFFICE.  HELPFUL INFORMATION  If you had a block, it will wear off between 8-24 hrs postop typically.  This is period when your pain may go from nearly zero to the pain you would have had postop without the block.  This is an abrupt transition but nothing dangerous is happening.  You may take an extra dose of narcotic when this happens.  You should wean off your narcotic medicines as soon as you are able.  Most patients will be off or using minimal narcotics before their first postop appointment.   We suggest you use the pain medication the first night prior to going to bed, in order to ease any pain when the anesthesia wears off. You should avoid taking pain medications on an empty stomach as it will make you nauseous.  Do not drink alcoholic beverages or take illicit drugs when taking pain medications.  In most states it is against the law to drive while you are in a splint or sling.  And certainly against the law to drive while taking narcotics.  You may  return to work/school in the next couple of days when you feel up to it.   Pain medication may make you constipated.  Below are a few solutions to try in this order: Decrease the amount of pain medication if you aren't having pain. Drink lots of decaffeinated fluids. Drink prune juice and/or each dried prunes  If the first 3 don't work start with additional solutions Take Colace - an over-the-counter stool softener Take Senokot - an over-the-counter laxative Take Miralax - a stronger over-the-counter laxative  For more information including helpful videos and documents visit our website:   [email or web address]

## 2020-09-14 NOTE — Care Management Important Message (Signed)
Important Message  Patient Details  Name: Megan Pitts MRN: [id number] Date of Birth: 12-28-1927   Medicare Important Message Given:  Yes     [name] 09/14/2020, 4:03 PM

## 2020-09-15 DIAGNOSIS — S82891A Other fracture of right lower leg, initial encounter for closed fracture: Secondary | ICD-10-CM | POA: Diagnosis not present

## 2020-09-15 MED ORDER — LACTULOSE 10 GM/15ML PO SOLN
20.0000 g | Freq: Two times a day (BID) | ORAL | Status: DC
  Administered 2020-09-15 (×2): 20 g via ORAL
  Filled 2020-09-15 (×2): qty 30

## 2020-09-15 MED ORDER — BISACODYL 10 MG RE SUPP: 10.0000 mg | Freq: Every day | RECTAL | Status: DC | PRN

## 2020-09-15 MED ORDER — PSYLLIUM 95 % PO PACK
1.0000 | PACK | Freq: Every day | ORAL | Status: DC
  Administered 2020-09-15: 1 via ORAL
  Filled 2020-09-15: qty 1

## 2020-09-15 MED ORDER — SENNOSIDES-DOCUSATE SODIUM 8.6-50 MG PO TABS
1.0000 | ORAL_TABLET | Freq: Two times a day (BID) | ORAL | Status: DC
  Administered 2020-09-15 – 2020-09-16 (×3): 1 via ORAL
  Filled 2020-09-15 (×3): qty 1

## 2020-09-15 MED ORDER — POLYETHYLENE GLYCOL 3350 17 G PO PACK: 17.0000 g | PACK | Freq: Every day | ORAL | Status: DC

## 2020-09-15 NOTE — Progress Notes (Signed)
Occupational Therapy Treatment Patient Details Name: Megan Pitts MRN: [id number] DOB: Sep 10, 1927 Today's Date: 09/15/2020    History of present illness Megan Pitts is a very pleasant 87 y.o. female admitted on 09/10/20 s/p fall with R ankle fx s/p ORIF on 09/11/20 NWB post op.  Pt with significant PMH of HTN, aortic steonsis s/p AVR in 2007, HLD,  chronic diastolic CHF.   OT comments  Treatment focused on standing and transfer to [facility]. Patient stood x 2 from recliner with min assist. Verbal cues for technique. Patient able to scoot left foot to transfer to [facility] using a RW but unable to hop. Treatment limited do to patient wanting to have BM and adamant about staying on [facility]. Nursing notified. Continues to need short term to maximize functional abilities.    Follow Up Recommendations  SNF;Supervision/Assistance - 24 hour    Equipment Recommendations  Other (comment) (TBD)    Recommendations for Other Services      Precautions / Restrictions Precautions Precautions: Fall Restrictions Weight Bearing Restrictions: Yes RLE Weight Bearing: Non weight bearing       Mobility Bed Mobility               General bed mobility comments: up in chair    Transfers Overall transfer level: Needs assistance Equipment used: Rolling walker (2 wheeled) Transfers: Sit to/from [facility] Sit to Stand: Min assist Stand pivot transfers: Min assist       General transfer comment: Min assist to rise from recliner x 2. Then min assist to pivot to the left to [facility] with walker sliding her foot instead of hopping.    Balance Overall balance assessment: Needs assistance Sitting-balance support: No upper extremity supported;Feet supported Sitting balance-Leahy Scale: Good     Standing balance support: Bilateral upper extremity supported Standing balance-Leahy Scale: Poor Standing balance comment: needs support of RW and therapist in standing due to NWB R LE                            ADL either performed or assessed with clinical judgement   ADL Overall ADL's : Needs assistance/impaired                         Toilet Transfer: Minimal assistance;BSC;Stand-pivot;RW Toilet Transfer Details (indicate cue type and reason): Performed stand pivot transfer, to the left, with. Patient unable to hop - so braced herself on walker and slid/scooted her foot in order to pivot. Min assist for steadying during turn. Toileting- Clothing Manipulation and Hygiene: Sitting/lateral lean;Moderate assistance Toileting - Clothing Manipulation Details (indicate cue type and reason): patient able to manage pericare but needs assistance with perianal.             Vision Baseline Vision/History: 1 Wears glasses Patient Visual Report: No change from baseline     Perception     Praxis      Cognition Arousal/Alertness: Awake/alert Behavior During Therapy: WFL for tasks assessed/performed Overall Cognitive Status: Within Functional Limits for tasks assessed                                 General Comments: HOH, so decreased comprehension at times, needs repetition        Exercises Exercises: General Lower Extremity General Exercises - Lower Extremity Ankle Circles/Pumps:  (toe wiggles right) Long Arc Quad: AROM;Both;10 reps;Seated Hip  ABduction/ADduction: Strengthening;Seated;Right;10 reps Straight Leg Raises: AROM;Right;10 reps;Seated   Shoulder Instructions       General Comments VSS on RA    Pertinent Vitals/ Pain       Pain Assessment: Faces Pain Score: 6  Faces Pain Scale: Hurts whole lot Pain Location: Rt ankle Pain Descriptors / Indicators: Aching Pain Intervention(s): Limited activity within patient's tolerance;Monitored during session  Home Living                                          Prior Functioning/Environment              Frequency  Min 2X/week        Progress Toward Goals  OT  Goals(current goals can now be found in the care plan section)  Progress towards OT goals: Progressing toward goals  Acute Rehab OT Goals Patient Stated Goal: Get back home OT Goal Formulation: With patient Time For Goal Achievement: 09/26/20 Potential to Achieve Goals: Good  Plan Discharge plan remains appropriate    Co-evaluation                 AM-PAC OT "6 Clicks" Daily Activity     Outcome Measure   Help from another person eating meals?: A Little Help from another person taking care of personal grooming?: A Little Help from another person toileting, which includes using toliet, bedpan, or urinal?: A Lot Help from another person bathing (including washing, rinsing, drying)?: A Lot Help from another person to put on and taking off regular upper body clothing?: A Little Help from another person to put on and taking off regular lower body clothing?: A Lot 6 Click Score: 15    End of Session Equipment Utilized During Treatment: Rolling walker;Gait belt  OT Visit Diagnosis: Unsteadiness on feet (R26.81);Other abnormalities of gait and mobility (R26.89);Muscle weakness (generalized) (M62.81)   Activity Tolerance Patient tolerated treatment well   Patient Left with call bell/phone within reach;with chair alarm set;Other (comment) (on [facility], nursing aware)   Nurse Communication Mobility status        Time: [id number] OT Time Calculation (min): 18 min  Charges: OT General Charges $OT Visit: 1 Visit OT Treatments $Self Care/Home Management : 8-22 mins  Derl Barrow, OTR/L [place]  Office [phone] Pager: [place] 09/15/2020, 3:44 PM

## 2020-09-15 NOTE — Progress Notes (Signed)
PROGRESS NOTE    Megan Pitts  [id number] DOB: Aug 06, 1927 DOA: 09/10/2020 PCP: Ginger Organ., MD   Brief Narrative:  This 87 years old female with PMH significant for hypertension, aortic stenosis s/p AVR, hyperlipidemia, diastolic heart failure presented s/p mechanical fall.  X-ray found to have right ankle fracture.  Orthopedic surgery consulted.  Patient underwent open reduction and internal fixation of ankle fracture.  PT recommended SNF.  Assessment & Plan:  Right ankle fracture: Patient presented s/p mechanical fall, twisted her ankle with resulting fracture. Orthopedic surgery consulted, underwent open reduction internal fixation. Continue not weightbearing right lower extremity, Lovenox for DVT prophylaxis and pain control. PT recommended skilled nursing facility, pending insurance authorization.  Hypertension: Continue amlodipine, HCTZ, irbesartan  Leukocytosis > Improving. Could be reactive in the setting of fracture.  CKD stage II: Renal functions at baseline  Chronic diastolic heart failure: Appears euvolemic on exam,  Aortic stenosis: History of AVR, not on anticoagulation.   Constipation. Will treat aggressively with bowel regimen.  Monitor.  DVT prophylaxis: Lovenox Code Status: Full code Family Communication: family at bedside Disposition Plan:   Status is: Inpatient  Remains inpatient appropriate because:Inpatient level of care appropriate due to severity of illness  Dispo: The patient is from: Home              Anticipated d/c is to: SNF              Patient currently is not medically stable to d/c.   Difficult to place patient No  Consultants:  Orthopedic surgery  Procedures: Open reduction internal fixation ankle fracture Antimicrobials:   Anti-infectives (From admission, onward)    Start     Dose/Rate Route Frequency Ordered Stop   09/11/20 2200  ceFAZolin (ANCEF) IVPB 1 g/50 mL premix        1 g 100 mL/hr over 30 Minutes  Intravenous Every 8 hours 09/11/20 2035 09/12/20 1446   09/11/20 1415  vancomycin (VANCOCIN) powder  Status:  Discontinued          As needed 09/11/20 1415 09/11/20 1528   09/11/20 1215  ceFAZolin (ANCEF) IVPB 2g/100 mL premix        2 g 200 mL/hr over 30 Minutes Intravenous To [facility]Stay [facility] 09/11/20 0055 09/11/20 1341        Subjective: No nausea no vomiting.  Pain well controlled.  No fever no chills.  Objective: Vitals:   09/15/20 0049 09/15/20 0459 09/15/20 0917 09/15/20 1610  BP: (!) 144/41 (!) 143/41 102/85 (!) 141/49  Pulse: 75 67 77 94  Resp: '16 16 18 18  '$ Temp: 98.8 F (37.1 C) 98.3 F (36.8 C) 98.9 F (37.2 C) 98.9 F (37.2 C)  TempSrc: Oral Oral Oral Oral  SpO2: 95% 95% 96% 91%  Weight:      Height:        Intake/Output Summary (Last 24 hours) at 09/15/2020 2015 Last data filed at 09/15/2020 1049 Gross per 24 hour  Intake --  Output 1000 ml  Net -1000 ml    Filed Weights   09/10/20 1240  Weight: 72.6 kg    Examination:  General: Appear in mild distress, no Rash; Oral Mucosa Clear, moist. no Abnormal Neck Mass Or lumps, Conjunctiva normal  Cardiovascular: S1 and S2 Present, no Murmur, Respiratory: good respiratory effort, Bilateral Air entry present and CTA, no Crackles, no wheezes Abdomen: Bowel Sound present, Soft and no tenderness Extremities: no Pedal edema Neurology: alert and oriented to time, place, and  person affect appropriate. no new focal deficit Gait not checked due to patient safety concerns     Data Reviewed: I have personally reviewed following labs and imaging studies  CBC: Recent Labs  Lab 09/10/20 1318 09/10/20 1330 09/11/20 0027 09/12/20 1015  WBC 13.5*  --  12.6* 10.9*  NEUTROABS 11.0*  --   --   --   HGB 14.4 14.6 13.2 14.0  HCT 43.0 43.0 38.9 42.2  MCV 89.4  --  88.6 89.6  PLT 375  --  350 [id number]    Basic Metabolic Panel: Recent Labs  Lab 09/10/20 1318 09/10/20 1330 09/11/20 0027 09/12/20 1015  NA 138 138 135  136  K 3.5 3.5 3.6 3.8  CL 103 108 101 99  CO2 24  --  27 25  GLUCOSE 132* 129* 101* 128*  BUN '21 22 18 14  '$ CREATININE 1.18* 1.00 1.03* 0.97  CALCIUM 9.2  --  8.7* 8.9    GFR: Estimated Creatinine Clearance: 37.2 mL/min (by C-G formula based on SCr of 0.97 mg/dL). Liver Function Tests: No results for input(s): AST, ALT, ALKPHOS, BILITOT, PROT, ALBUMIN in the last 168 hours. No results for input(s): LIPASE, AMYLASE in the last 168 hours. No results for input(s): AMMONIA in the last 168 hours. Coagulation Profile: No results for input(s): INR, PROTIME in the last 168 hours. Cardiac Enzymes: No results for input(s): CKTOTAL, CKMB, CKMBINDEX, TROPONINI in the last 168 hours. BNP (last 3 results) No results for input(s): PROBNP in the last 8760 hours. HbA1C: No results for input(s): HGBA1C in the last 72 hours. CBG: No results for input(s): GLUCAP in the last 168 hours. Lipid Profile: No results for input(s): CHOL, HDL, LDLCALC, TRIG, CHOLHDL, LDLDIRECT in the last 72 hours. Thyroid Function Tests: No results for input(s): TSH, T4TOTAL, FREET4, T3FREE, THYROIDAB in the last 72 hours. Anemia Panel: No results for input(s): VITAMINB12, FOLATE, FERRITIN, TIBC, IRON, RETICCTPCT in the last 72 hours. Sepsis Labs: No results for input(s): PROCALCITON, LATICACIDVEN in the last 168 hours.  Recent Results (from the past 240 hour(s))  Resp Panel by RT-PCR (Flu A&B, Covid) Nasopharyngeal Swab     Status: None   Collection Time: 09/10/20  1:55 PM   Specimen: Nasopharyngeal Swab; Nasopharyngeal(NP) swabs in vial transport medium  Result Value Ref Range Status   SARS Coronavirus 2 by RT PCR NEGATIVE NEGATIVE Final    Comment: (NOTE) SARS-CoV-2 target nucleic acids are NOT DETECTED.  The SARS-CoV-2 RNA is generally detectable in upper respiratory specimens during the acute phase of infection. The lowest concentration of SARS-CoV-2 viral copies this assay can detect is 138 copies/mL. A  negative result does not preclude SARS-Cov-2 infection and should not be used as the sole basis for treatment or other patient management decisions. A negative result may occur with  improper specimen collection/handling, submission of specimen other than nasopharyngeal swab, presence of viral mutation(s) within the areas targeted by this assay, and inadequate number of viral copies(<138 copies/mL). A negative result must be combined with clinical observations, patient history, and epidemiological information. The expected result is Negative.  Fact Sheet for Patients:  EntrepreneurPulse.com.au  Fact Sheet for Healthcare Providers:  IncredibleEmployment.be  This test is no t yet approved or cleared by the [place] FDA and  has been authorized for detection and/or diagnosis of SARS-CoV-2 by FDA under an Emergency Use Authorization (EUA). This EUA will remain  in effect (meaning this test can be used) for the duration of the COVID-19 declaration under  Section 564(b)(1) of the Act, 21 U.S.C.section 360bbb-3(b)(1), unless the authorization is terminated  or revoked sooner.       Influenza A by PCR NEGATIVE NEGATIVE Final   Influenza B by PCR NEGATIVE NEGATIVE Final    Comment: (NOTE) The Xpert Xpress SARS-CoV-2/FLU/RSV plus assay is intended as an aid in the diagnosis of influenza from Nasopharyngeal swab specimens and should not be used as a sole basis for treatment. Nasal washings and aspirates are unacceptable for Xpert Xpress SARS-CoV-2/FLU/RSV testing.  Fact Sheet for Patients: EntrepreneurPulse.com.au  Fact Sheet for Healthcare Providers: IncredibleEmployment.be  This test is not yet approved or cleared by the [place] FDA and has been authorized for detection and/or diagnosis of SARS-CoV-2 by FDA under an Emergency Use Authorization (EUA). This EUA will remain in effect (meaning this test can  be used) for the duration of the COVID-19 declaration under Section 564(b)(1) of the Act, 21 U.S.C. section 360bbb-3(b)(1), unless the authorization is terminated or revoked.  Performed at [facility], [place] [address], [place], [place] [address]   Surgical pcr screen     Status: None   Collection Time: 09/10/20  9:36 PM   Specimen: Nasal Mucosa; Nasal Swab  Result Value Ref Range Status   MRSA, PCR NEGATIVE NEGATIVE Final   Staphylococcus aureus NEGATIVE NEGATIVE Final    Comment: (NOTE) The Xpert SA Assay (FDA approved for NASAL specimens in patients 33 years of age and older), is one component of a comprehensive surveillance program. It is not intended to diagnose infection nor to guide or monitor treatment. Performed at [facility], [place] [address], [place], [place] [address]      Radiology Studies: No results found.   Scheduled Meds:  amLODipine  10 mg Oral Daily   donepezil  10 mg Oral QHS   enoxaparin (LOVENOX) injection  40 mg Subcutaneous Q24H   etomidate  10 mg Intravenous Once   gabapentin  100 mg Oral QHS   hydrochlorothiazide  25 mg Oral Daily   irbesartan  300 mg Oral Daily   lactulose  20 g Oral BID   psyllium  1 packet Oral Daily   senna-docusate  1 tablet Oral BID   sodium chloride flush  3 mL Intravenous Q12H   Continuous Infusions:  sodium chloride       LOS: 5 days    Time spent: 25 mins    Berle Mull, MD [facility]   If 7PM-7AM, please contact night-coverage

## 2020-09-15 NOTE — Progress Notes (Signed)
Physical Therapy Treatment Patient Details Name: Megan Pitts MRN: [id number] DOB: [birth year] Today's Date: 09/15/2020    History of Present Illness Megan Pitts is a very pleasant 87 y.o. female admitted on 09/10/20 s/p fall with R ankle fx s/p ORIF on 09/11/20 NWB post op.  Pt with significant PMH of HTN, aortic steonsis s/p AVR in 2007, HLD,  chronic diastolic CHF.    PT Comments    Pt received in chair and not yet ready to get back in the bed. Worked on sit<>stand transfers from recliner with min A and RW. Pt unable to hop on LLE at this time to progress ambulation. Performed BLE there ex in sitting. Pt requesting pain meds due to throbbing RLE, RN notified. PT will continue to follow.    Follow Up Recommendations  SNF     Equipment Recommendations  Wheelchair (measurements PT);Wheelchair cushion (measurements PT);3in1 (PT);Rolling walker with 5" wheels    Recommendations for Other Services       Precautions / Restrictions Precautions Precautions: Fall Restrictions Weight Bearing Restrictions: Yes RLE Weight Bearing: Non weight bearing    Mobility  Bed Mobility               General bed mobility comments: up in chair and did not want to go back to bed yet    Transfers Overall transfer level: Needs assistance Equipment used: Rolling walker (2 wheeled) Transfers: Sit to/from Stand Sit to Stand: Min assist         General transfer comment: min A for stabilizing from recliner and therapist's foot under pt's R foot to ensure NWB. Performed multiple times. Pt fatigued by last rep  Ambulation/Gait             General Gait Details: pt not able to hop yet   Stairs             Wheelchair Mobility    Modified Rankin (Stroke Patients Only)       Balance Overall balance assessment: Needs assistance Sitting-balance support: Feet supported;Bilateral upper extremity supported;Single extremity supported;No upper extremity supported Sitting balance-Leahy  Scale: Good     Standing balance support: Bilateral upper extremity supported Standing balance-Leahy Scale: Poor Standing balance comment: needs support of RW and therapist in standing due to NWB R LE                            Cognition Arousal/Alertness: Awake/alert Behavior During Therapy: WFL for tasks assessed/performed Overall Cognitive Status: Within Functional Limits for tasks assessed                                 General Comments: HOH, so decreased comprehension at times, needs repetition      Exercises General Exercises - Lower Extremity Ankle Circles/Pumps:  (toe wiggles right) Long Arc Quad: AROM;Both;10 reps;Seated Hip ABduction/ADduction: Strengthening;Seated;Right;10 reps Straight Leg Raises: AROM;Right;10 reps;Seated    General Comments General comments (skin integrity, edema, etc.): VSS on RA      Pertinent Vitals/Pain Pain Assessment: Faces Faces Pain Scale: Hurts whole lot Pain Location: Rt ankle Pain Descriptors / Indicators: Throbbing Pain Intervention(s): Limited activity within patient's tolerance;Monitored during session;Patient requesting pain meds-RN notified    Home Living                      Prior Function  PT Goals (current goals can now be found in the care plan section) Acute Rehab PT Goals Patient Stated Goal: Get back home PT Goal Formulation: With patient Time For Goal Achievement: 09/26/20 Potential to Achieve Goals: Good Progress towards PT goals: Progressing toward goals    Frequency    Min 3X/week      PT Plan Current plan remains appropriate    Co-evaluation              AM-PAC PT "6 Clicks" Mobility   Outcome Measure  Help needed turning from your back to your side while in a flat bed without using bedrails?: A Little Help needed moving from lying on your back to sitting on the side of a flat bed without using bedrails?: A Little Help needed moving to and  from a bed to a chair (including a wheelchair)?: A Little Help needed standing up from a chair using your arms (e.g., wheelchair or bedside chair)?: A Lot Help needed to walk in hospital room?: A Lot Help needed climbing 3-5 steps with a railing? : Total 6 Click Score: 14    End of Session Equipment Utilized During Treatment: Gait belt Activity Tolerance: Patient tolerated treatment well Patient left: in chair;with call bell/phone within reach Nurse Communication: Mobility status;Patient requests pain meds PT Visit Diagnosis: Muscle weakness (generalized) (M62.81);History of falling (Z91.81);Difficulty in walking, not elsewhere classified (R26.2);Pain Pain - Right/Left: Right Pain - part of body: Ankle and joints of foot     Time: [id number] PT Time Calculation (min) (ACUTE ONLY): 14 min  Charges:  $Therapeutic Exercise: 8-22 mins                     Leighton Roach, PT  Acute Rehab Services  Pager [phone] Office [place] 09/15/2020, 2:32 PM

## 2020-09-16 DIAGNOSIS — S82891A Other fracture of right lower leg, initial encounter for closed fracture: Secondary | ICD-10-CM | POA: Diagnosis not present

## 2020-09-16 DIAGNOSIS — S82891D Other fracture of right lower leg, subsequent encounter for closed fracture with routine healing: Secondary | ICD-10-CM | POA: Diagnosis not present

## 2020-09-16 DIAGNOSIS — E785 Hyperlipidemia, unspecified: Secondary | ICD-10-CM | POA: Diagnosis not present

## 2020-09-16 DIAGNOSIS — I129 Hypertensive chronic kidney disease with stage 1 through stage 4 chronic kidney disease, or unspecified chronic kidney disease: Secondary | ICD-10-CM | POA: Diagnosis not present

## 2020-09-16 DIAGNOSIS — Z9181 History of falling: Secondary | ICD-10-CM | POA: Diagnosis not present

## 2020-09-16 DIAGNOSIS — M6281 Muscle weakness (generalized): Secondary | ICD-10-CM | POA: Diagnosis not present

## 2020-09-16 DIAGNOSIS — S82899A Other fracture of unspecified lower leg, initial encounter for closed fracture: Secondary | ICD-10-CM | POA: Diagnosis not present

## 2020-09-16 DIAGNOSIS — R2681 Unsteadiness on feet: Secondary | ICD-10-CM | POA: Diagnosis not present

## 2020-09-16 DIAGNOSIS — N182 Chronic kidney disease, stage 2 (mild): Secondary | ICD-10-CM | POA: Diagnosis not present

## 2020-09-16 DIAGNOSIS — I350 Nonrheumatic aortic (valve) stenosis: Secondary | ICD-10-CM | POA: Diagnosis not present

## 2020-09-16 DIAGNOSIS — Z79810 Long term (current) use of selective estrogen receptor modulators (SERMs): Secondary | ICD-10-CM | POA: Diagnosis not present

## 2020-09-16 DIAGNOSIS — I130 Hypertensive heart and chronic kidney disease with heart failure and stage 1 through stage 4 chronic kidney disease, or unspecified chronic kidney disease: Secondary | ICD-10-CM | POA: Diagnosis not present

## 2020-09-16 DIAGNOSIS — I5032 Chronic diastolic (congestive) heart failure: Secondary | ICD-10-CM | POA: Diagnosis not present

## 2020-09-16 DIAGNOSIS — R5381 Other malaise: Secondary | ICD-10-CM | POA: Diagnosis not present

## 2020-09-16 DIAGNOSIS — W19XXXA Unspecified fall, initial encounter: Secondary | ICD-10-CM | POA: Diagnosis not present

## 2020-09-16 DIAGNOSIS — Z7401 Bed confinement status: Secondary | ICD-10-CM | POA: Diagnosis not present

## 2020-09-16 DIAGNOSIS — S82851D Displaced trimalleolar fracture of right lower leg, subsequent encounter for closed fracture with routine healing: Secondary | ICD-10-CM | POA: Diagnosis not present

## 2020-09-16 DIAGNOSIS — R262 Difficulty in walking, not elsewhere classified: Secondary | ICD-10-CM | POA: Diagnosis not present

## 2020-09-16 DIAGNOSIS — I69828 Other speech and language deficits following other cerebrovascular disease: Secondary | ICD-10-CM | POA: Diagnosis not present

## 2020-09-16 DIAGNOSIS — Z952 Presence of prosthetic heart valve: Secondary | ICD-10-CM | POA: Diagnosis not present

## 2020-09-16 DIAGNOSIS — I493 Ventricular premature depolarization: Secondary | ICD-10-CM | POA: Diagnosis not present

## 2020-09-16 DIAGNOSIS — R41841 Cognitive communication deficit: Secondary | ICD-10-CM | POA: Diagnosis not present

## 2020-09-16 LAB — RESP PANEL BY RT-PCR (FLU A&B, COVID) ARPGX2
Influenza A by PCR: NEGATIVE
Influenza B by PCR: NEGATIVE
SARS Coronavirus 2 by RT PCR: NEGATIVE

## 2020-09-16 MED ORDER — BISACODYL 10 MG RE SUPP: 10.0000 mg | Freq: Every day | RECTAL | 0 refills | Status: DC | PRN

## 2020-09-16 MED ORDER — DOCUSATE SODIUM 100 MG PO CAPS: 100.0000 mg | ORAL_CAPSULE | Freq: Two times a day (BID) | ORAL | 2 refills | Status: AC | PRN

## 2020-09-16 MED ORDER — PSYLLIUM 95 % PO PACK: 1.0000 | PACK | Freq: Every day | ORAL | 0 refills | Status: DC

## 2020-09-16 NOTE — Consult Note (Signed)
   [facility] CM Inpatient Consult   09/16/2020  Megan Pitts 04/16/1927 [id number]  [facility] [ACO] Patient: Medicare CMS DCE  Will request for patient to be followed by [facility] PAC  RN with traditional Medicare for any known or needs for transitional care needs for returning to post facility care or complex disease management.  For questions or referrals, please contact:   Natividad Brood, RN BSN [facility] Liaison  [phone] business mobile phone Toll free office [phone]  Fax number: [phone] [email or web address]

## 2020-09-16 NOTE — Progress Notes (Signed)
Report called to SNF and given to [facility], LPN. PTAR at bedside to transport pt.

## 2020-09-16 NOTE — TOC Transition Note (Addendum)
Transition of Care [facility]) - CM/SW Discharge Note   Patient Details  Name: Megan Pitts MRN: [id number] Date of Birth: Nov 22, 1927  Transition of Care [facility]) CM/SW Contact:  Emeterio Reeve, LCSW Phone Number: 09/16/2020, 11:50 AM   Clinical Narrative:     Patient will DC to: [facility] Anticipated DC date: 09/16/20 Family notified: daughter Transport by: Corey Harold     Per MD patient ready for DC to [facility], patient, patient's family, and facility notified of DC. Discharge Summary and FL2 sent to facility. DC packet on chart. Insurance Josem Kaufmann has been received and pt is covid negative. Ambulance transport requested for patient.    RN to call report to [phone].  CSW will sign off for now as social work intervention is no longer needed. Please consult [place] again if new needs arise.   Final next level of care: Skilled Nursing Facility Barriers to Discharge: Barriers Resolved   Patient Goals and CMS Choice Patient states their goals for this hospitalization and ongoing recovery are:: to get well CMS Medicare.gov Compare Post Acute Care list provided to:: Patient Choice offered to / list presented to : Patient  Discharge Placement              Patient chooses bed at: [facility] Patient to be transferred to facility by: ptar Name of family member notified: daughter Patient and family notified of of transfer: 09/16/20  Discharge Plan and Services     Post Acute Care Choice: [place]                               Social Determinants of Health ([place]) Interventions     Readmission Risk Interventions Readmission Risk Prevention Plan 06/04/2019  Post Dischage Appt Not Complete  Appt Comments pending disposition/likely SNF  Medication Screening Complete  Transportation Screening Complete  Some recent data might be hidden    Emeterio Reeve, LCSW Clinical Social Worker

## 2020-09-16 NOTE — Discharge Summary (Signed)
[facility] Discharge Summary   Patient: Megan Pitts [id number]  PCP: Ginger Organ., MD  Date of admission: 09/10/2020   Date of discharge:  09/16/2020     Discharge Diagnoses:  Principal Problem:   Closed right ankle fracture Active Problems:   Aortic stenosis   Hypertension   PVC's (premature ventricular contractions)   Hyperlipidemia   Chronic diastolic CHF (congestive heart failure) (HCC)   Leucocytosis   CKD (chronic kidney disease), stage II   Admitted From: home Disposition:  SNF   Recommendations for Outpatient Follow-up:  PCP: please follow up with PCP in 1 week and Orthopedics as recommended  Follow up LABS/TEST:  CBC and BMP    Follow-up Information     Hiram Gash, MD Follow up in 1 week(s).   Specialty: Orthopedic Surgery Why: For wound re-check Contact information: [address] [place] [address] [phone]         Ginger Organ., MD. Schedule an appointment as soon as possible for a visit in 1 week(s).   Specialty: Internal Medicine Contact information: [place] [address] [phone]                Discharge Instructions     Diet - low sodium heart healthy   Complete by: As directed    Increase activity slowly   Complete by: As directed    Leave dressing on - Keep it clean, dry, and intact until clinic visit   Complete by: As directed        Diet recommendation: Regular diet  Activity: The patient is advised to gradually reintroduce usual activities, as tolerated  Discharge Condition: stable  Code Status: Full code   History of present illness: As per the H and P dictated on admission, " Megan Pitts is a 87 y.o. female with medical history significant of HTN, aortic steonsis s/p AVR in 2007, HLD,  chronic diastolic CHF who presented to ER after she fell and tripped in her backyard.  She was walking and took a step up in her backyard and missed the step and fell down on  her right ankle and twisted it. She had immediate pain and was unable to bear weight. No chest pain, palpitations or syncope.  Her daughter states "her ankle was off to the side." Did not hit head.  Did not have immediate swelling. Pain rated as a 10/10 and sharp in nature. No radiation. History of falls with broken neck and shoulder in the past. hx of osteoporosis and sounds like she is on prolia. She takes vitamin D daily.    She has been feeling well. Denies any fever/chills, headaches, dizziness, lightheadedness, chest pain, palpitations, shortness of breath, cough, stomach pain, N/V/D, dysuria, weight gain.    ED Course: vitals: Afebrile, 137/68, heart rate 69, respiratory rate 21, oxygen 98% on room air.  Pertinent labs: WBC 13.5, creatinine 1.18.  Right ankle x-ray: Denuded, displaced and angulated fractures of the distal tibia and fibula.  Medial malleolus fragment remains approximated with the talus.  The lateral malleolus is displaced laterally and angulated laterally relative to the proximal fibula fragment.  CT head no acute findings.  Given Zofran and morphine in ER.  Ortho consulted and we were asked to admit"  Hospital Course:  Summary of her active problems in the hospital is as following. Right ankle fracture: Patient presented s/p mechanical fall, twisted her ankle with resulting fracture. Orthopedic surgery consulted, underwent  open reduction internal fixation. Continue not weightbearing right lower extremity, Lovenox for DVT prophylaxis and pain control. PT recommended skilled nursing facility.    Hypertension: Continue amlodipine, HCTZ, ARB    Leukocytosis > Improving. Could be reactive in the setting of fracture.   CKD stage II: Renal functions at baseline   Chronic diastolic heart failure: Appears euvolemic on exam   Severe Aortic stenosis: History of AVR bioprosthetic  not on anticoagulation.    Constipation. Will treat aggressively with bowel regimen.   Monitor.  Body mass index is 27.9 kg/m.   Pain control  - [employer] Controlled Substance Reporting System database was reviewed. - 5 day supply was provided. - Patient was instructed, not to drive, operate heavy machinery, perform activities at heights, swimming or participation in water activities or provide baby sitting services while on Pain, Sleep and Anxiety Medications; until her outpatient Physician has advised to do so again.  - Also recommended to not to take more than prescribed Pain, Sleep and Anxiety Medications.  Patient was seen by physical therapy, who recommended SNF, . On the day of the discharge the patient's vitals were stable, and no other new acute medical condition were reported. The patient was felt safe to be discharge at SNF with Therapy.  Consultants: Orthopedics  Procedures: ORIF right ankle  DISCHARGE MEDICATION: Allergies as of 09/16/2020       Reactions   Synthroid [levothyroxine Sodium]    Pt stated not sure what happens when she takes this        Medication List     STOP taking these medications    ibuprofen 400 MG tablet Commonly known as: ADVIL       TAKE these medications    amLODipine 10 MG tablet Commonly known as: NORVASC Take 10 mg by mouth daily.   aspirin 81 MG tablet Take 81 mg by mouth daily.   bisacodyl 10 MG suppository Commonly known as: DULCOLAX Place 1 suppository (10 mg total) rectally daily as needed for moderate constipation.   docusate sodium 100 MG capsule Commonly known as: Colace Take 1 capsule (100 mg total) by mouth 2 (two) times daily as needed for mild constipation.   donepezil 10 MG tablet Commonly known as: ARICEPT Take 10 mg by mouth at bedtime.   enoxaparin 40 MG/0.4ML injection Commonly known as: LOVENOX Inject 0.4 mLs (40 mg total) into the skin daily.   hydrochlorothiazide 25 MG tablet Commonly known as: HYDRODIURIL Take 25 mg by mouth daily.   HYDROcodone-acetaminophen 5-325 MG  tablet Commonly known as: Norco Take 1-2 tablets by mouth every 6 (six) hours as needed for severe pain.   methocarbamol 500 MG tablet Commonly known as: ROBAXIN Take 1 tablet by mouth as needed. For spasms/muscle tension   olmesartan 40 MG tablet Commonly known as: BENICAR Take 40 mg by mouth daily.   psyllium 95 % Pack Commonly known as: HYDROCIL/METAMUCIL Take 1 packet by mouth daily.   Vitamin D (Ergocalciferol) 1.25 MG (50000 UNIT) Caps capsule Commonly known as: DRISDOL Take 50,000 Units by mouth every 7 (seven) days. On 'Sundays               Discharge Care Instructions  (From admission, onward)           Start     Ordered   09/16/20 0000  Leave dressing on - Keep it clean, dry, and intact until clinic visit        09'$ /08/22 1026  Discharge Exam: Filed Weights   09/10/20 1240  Weight: 72.6 kg   Vitals:   09/16/20 0748 09/16/20 0754  BP: (!) 146/55 (!) 146/55  Pulse: 73 73  Resp:  16  Temp: 98.4 F (36.9 C) 98.4 F (36.9 C)  SpO2: 97% 97%   General: Appear in mild distress, no Rash; Oral Mucosa Clear, moist. no Abnormal Neck Mass Or lumps, Conjunctiva normal  Cardiovascular: S1 and S2 Present, aortic systolic  Murmur, Respiratory: good respiratory effort, Bilateral Air entry present and CTA, no Crackles, no wheezes Abdomen: Bowel Sound present, Soft and no tenderness Extremities: no Pedal edema Neurology: alert and oriented to time, place, and person affect appropriate. no new focal deficit Gait not checked due to patient safety concerns  The results of significant diagnostics from this hospitalization (including imaging, microbiology, ancillary and laboratory) are listed below for reference.    Significant Diagnostic Studies: DG Ankle Complete Right  Result Date: 09/10/2020 CLINICAL DATA:  Status post reduction of right ankle fracture. EXAM: RIGHT ANKLE - COMPLETE 3+ VIEW COMPARISON:  September 10, 2020. FINDINGS: Right ankle has  been casted and immobilized. There has been partial reduction of the talotibial dislocation noted on prior exam, with mild persistent lateral dislocation remaining. Displaced fractures involving the medial malleolus and distal right fibula are also noted and have been partially reduced since prior exam. IMPRESSION: Casting and immobilization of right ankle status post partial reduction of talotibial dislocation as well as displaced fractures involving medial malleolus and distal right fibula. Electronically Signed   By: Marijo Conception M.D.   On: 09/10/2020 14:39   CT HEAD WO CONTRAST (5MM)  Result Date: 09/10/2020 CLINICAL DATA:  Head injury after fall. EXAM: CT HEAD WITHOUT CONTRAST TECHNIQUE: Contiguous axial images were obtained from the base of the skull through the vertex without intravenous contrast. COMPARISON:  September 24, 2017. FINDINGS: Brain: No evidence of acute infarction, hemorrhage, hydrocephalus, extra-axial collection or mass lesion/mass effect. Vascular: No hyperdense vessel or unexpected calcification. Skull: Normal. Negative for fracture or focal lesion. Sinuses/Orbits: No acute finding. Other: None. IMPRESSION: No acute intracranial abnormality seen. Electronically Signed   By: Marijo Conception M.D.   On: 09/10/2020 15:40   CT ANKLE RIGHT WO CONTRAST  Result Date: 09/10/2020 CLINICAL DATA:  Right ankle fracture dislocation. Fall with ankle deformity. EXAM: CT OF THE RIGHT ANKLE WITHOUT CONTRAST TECHNIQUE: Multidetector CT imaging of the right ankle was performed according to the standard protocol. Multiplanar CT image reconstructions were also generated. COMPARISON:  Radiographs 09/10/2020 FINDINGS: Bones/Joint/Cartilage The ankle is splinted. The overall alignment is improved from the original radiographs, although there is persistent lateral subluxation of the talar dome relative to the tibial plafond. Comminuted, transverse fracture through the base of the medial malleolus remains  laterally displaced by up to 1.4 cm. Intra-articular fracture involving the posterior articular surface of the tibial plafond demonstrates up to 4 mm of depression, best seen on the sagittal images. Oblique, mildly comminuted fracture of the distal fibula demonstrates up to 9 mm of posterior and lateral displacement. This fracture is also mildly angulated laterally. The talar dome and additional tarsal bones appear intact there is only a small ankle joint effusion, and no fracture fragments are seen interposed between the talar dome and tibial plafond. Ligaments Suboptimally assessed by CT. Muscles and Tendons The ankle tendons appear intact. The posterior tibialis tendon is in close proximity to the fracture of the medial malleolus, but demonstrates no entrapment. Soft tissues Mild subcutaneous edema  surrounding the ankle without evidence of foreign body, focal hematoma or soft tissue emphysema. Scattered vascular calcifications are noted IMPRESSION: 1. Improved alignment of the trimalleolar fracture compared with the original radiographs. There is persistent lateral displacement of the distal fibula and medial malleolus as well as residual lateral subluxation of the talar dome relative to the tibial plafond. 2. No evidence of talar dome or other tarsal bone injury. 3. No evidence ankle tendon rupture or entrapment. Electronically Signed   By: Richardean Sale M.D.   On: 09/10/2020 17:45   DG Ankle Right Port  Result Date: 09/11/2020 CLINICAL DATA:  Postop right ankle fracture. EXAM: PORTABLE RIGHT ANKLE - 2 VIEW COMPARISON:  Preoperative imaging yesterday. FINDINGS: Two screws traverse the medial malleolar fracture in improved alignment. Lateral plate and multi screw fixation of distal fibular fracture with syndesmotic and inter fragmentary screw. Fibular fracture is in improved alignment. Posterior tibial tubercle fracture is obscured on the current exam due to overlying hardware. Improved ankle mortise  alignment. Overlying cast material in place. IMPRESSION: ORIF distal tibia and fibular fractures without immediate postoperative complication. Improved fracture and ankle mortise alignment from preoperative exams. Electronically Signed   By: Keith Rake M.D.   On: 09/11/2020 16:10   DG Ankle Right Port  Result Date: 09/10/2020 CLINICAL DATA:  Ankle fracture.  Fall with ankle deformity. EXAM: PORTABLE RIGHT ANKLE - 2 VIEW COMPARISON:  09/18/2013 right foot radiographs FINDINGS: There are comminuted, displaced, and angulated fractures of the distal tibia and fibula. There is lateral and posterior dislocation of the talus relative to the tibia. A medial malleolus fragment remains approximated with the talus. The lateral malleolus is displaced laterally and angulated laterally relative to the proximal fibula fragment. There is a small plantar calcaneal enthesophyte. Mild soft tissue swelling is noted at the ankle. IMPRESSION: Right ankle fracture-dislocation. Electronically Signed   By: Logan Bores M.D.   On: 09/10/2020 13:15   DG MINI C-ARM IMAGE ONLY  Result Date: 09/11/2020 There is no interpretation for this exam.  This order is for images obtained during a surgical procedure.  Please See "Surgeries" Tab for more information regarding the procedure.    Microbiology: Recent Results (from the past 240 hour(s))  Resp Panel by RT-PCR (Flu A&B, Covid) Nasopharyngeal Swab     Status: None   Collection Time: 09/10/20  1:55 PM   Specimen: Nasopharyngeal Swab; Nasopharyngeal(NP) swabs in vial transport medium  Result Value Ref Range Status   SARS Coronavirus 2 by RT PCR NEGATIVE NEGATIVE Final    Comment: (NOTE) SARS-CoV-2 target nucleic acids are NOT DETECTED.  The SARS-CoV-2 RNA is generally detectable in upper respiratory specimens during the acute phase of infection. The lowest concentration of SARS-CoV-2 viral copies this assay can detect is 138 copies/mL. A negative result does not preclude  SARS-Cov-2 infection and should not be used as the sole basis for treatment or other patient management decisions. A negative result may occur with  improper specimen collection/handling, submission of specimen other than nasopharyngeal swab, presence of viral mutation(s) within the areas targeted by this assay, and inadequate number of viral copies(<138 copies/mL). A negative result must be combined with clinical observations, patient history, and epidemiological information. The expected result is Negative.  Fact Sheet for Patients:  EntrepreneurPulse.com.au  Fact Sheet for Healthcare Providers:  IncredibleEmployment.be  This test is no t yet approved or cleared by the [place] FDA and  has been authorized for detection and/or diagnosis of SARS-CoV-2 by FDA under an  Emergency Use Authorization (EUA). This EUA will remain  in effect (meaning this test can be used) for the duration of the COVID-19 declaration under Section 564(b)(1) of the Act, 21 U.S.C.section 360bbb-3(b)(1), unless the authorization is terminated  or revoked sooner.       Influenza A by PCR NEGATIVE NEGATIVE Final   Influenza B by PCR NEGATIVE NEGATIVE Final    Comment: (NOTE) The Xpert Xpress SARS-CoV-2/FLU/RSV plus assay is intended as an aid in the diagnosis of influenza from Nasopharyngeal swab specimens and should not be used as a sole basis for treatment. Nasal washings and aspirates are unacceptable for Xpert Xpress SARS-CoV-2/FLU/RSV testing.  Fact Sheet for Patients: EntrepreneurPulse.com.au  Fact Sheet for Healthcare Providers: IncredibleEmployment.be  This test is not yet approved or cleared by the [place] FDA and has been authorized for detection and/or diagnosis of SARS-CoV-2 by FDA under an Emergency Use Authorization (EUA). This EUA will remain in effect (meaning this test can be used) for the duration of  the COVID-19 declaration under Section 564(b)(1) of the Act, 21 U.S.C. section 360bbb-3(b)(1), unless the authorization is terminated or revoked.  Performed at [facility], [place] [address], [place], [place] [address]   Surgical pcr screen     Status: None   Collection Time: 09/10/20  9:36 PM   Specimen: Nasal Mucosa; Nasal Swab  Result Value Ref Range Status   MRSA, PCR NEGATIVE NEGATIVE Final   Staphylococcus aureus NEGATIVE NEGATIVE Final    Comment: (NOTE) The Xpert SA Assay (FDA approved for NASAL specimens in patients 75 years of age and older), is one component of a comprehensive surveillance program. It is not intended to diagnose infection nor to guide or monitor treatment. Performed at [facility], [place] [address], [place], [place] [address]      Labs: CBC: Recent Labs  Lab 09/10/20 1318 09/10/20 1330 09/11/20 0027 09/12/20 1015  WBC 13.5*  --  12.6* 10.9*  NEUTROABS 11.0*  --   --   --   HGB 14.4 14.6 13.2 14.0  HCT 43.0 43.0 38.9 42.2  MCV 89.4  --  88.6 89.6  PLT 375  --  350 [id number]   Basic Metabolic Panel: Recent Labs  Lab 09/10/20 1318 09/10/20 1330 09/11/20 0027 09/12/20 1015  NA 138 138 135 136  K 3.5 3.5 3.6 3.8  CL 103 108 101 99  CO2 24  --  27 25  GLUCOSE 132* 129* 101* 128*  BUN '21 22 18 14  '$ CREATININE 1.18* 1.00 1.03* 0.97  CALCIUM 9.2  --  8.7* 8.9   Liver Function Tests: No results for input(s): AST, ALT, ALKPHOS, BILITOT, PROT, ALBUMIN in the last 168 hours. CBG: No results for input(s): GLUCAP in the last 168 hours.  Time spent: 35 minutes  Signed:  Berle Mull  [facility]  09/16/2020

## 2020-09-17 ENCOUNTER — Ambulatory Visit: Payer: Medicare Other | Admitting: Student

## 2020-09-23 DIAGNOSIS — I350 Nonrheumatic aortic (valve) stenosis: Secondary | ICD-10-CM | POA: Diagnosis not present

## 2020-09-23 DIAGNOSIS — Z952 Presence of prosthetic heart valve: Secondary | ICD-10-CM | POA: Diagnosis not present

## 2020-09-23 DIAGNOSIS — R262 Difficulty in walking, not elsewhere classified: Secondary | ICD-10-CM | POA: Diagnosis not present

## 2020-09-23 DIAGNOSIS — S82891D Other fracture of right lower leg, subsequent encounter for closed fracture with routine healing: Secondary | ICD-10-CM | POA: Diagnosis not present

## 2020-09-28 DIAGNOSIS — S82851D Displaced trimalleolar fracture of right lower leg, subsequent encounter for closed fracture with routine healing: Secondary | ICD-10-CM | POA: Diagnosis not present

## 2020-10-12 ENCOUNTER — Other Ambulatory Visit: Payer: Self-pay | Admitting: *Deleted

## 2020-10-12 DIAGNOSIS — S82851D Displaced trimalleolar fracture of right lower leg, subsequent encounter for closed fracture with routine healing: Secondary | ICD-10-CM | POA: Diagnosis not present

## 2020-10-12 NOTE — Patient Outreach (Signed)
Member screened for potential [facility] Care Management needs. Mrs. Grumbine resides in [facility].   Update received from [facility] SW reporting member's goal is to ultimately return home. She lives alone. Primary contact is daughter/DPR Pattie.  Will continue to follow for potential [facility] Care Management services.   Marthenia Rolling, MSN, RN,BSN [place] Acute Care Coordinator [phone] [facility]) [phone]  (Toll free office)

## 2020-10-25 DIAGNOSIS — E785 Hyperlipidemia, unspecified: Secondary | ICD-10-CM | POA: Diagnosis not present

## 2020-10-25 DIAGNOSIS — I129 Hypertensive chronic kidney disease with stage 1 through stage 4 chronic kidney disease, or unspecified chronic kidney disease: Secondary | ICD-10-CM | POA: Diagnosis not present

## 2020-10-25 DIAGNOSIS — I5032 Chronic diastolic (congestive) heart failure: Secondary | ICD-10-CM | POA: Diagnosis not present

## 2020-10-25 DIAGNOSIS — N182 Chronic kidney disease, stage 2 (mild): Secondary | ICD-10-CM | POA: Diagnosis not present

## 2020-11-09 DIAGNOSIS — S82851D Displaced trimalleolar fracture of right lower leg, subsequent encounter for closed fracture with routine healing: Secondary | ICD-10-CM | POA: Diagnosis not present

## 2020-11-10 ENCOUNTER — Other Ambulatory Visit: Payer: Self-pay | Admitting: *Deleted

## 2020-11-10 NOTE — Patient Outreach (Signed)
[place] Coordinator follow up. Mrs. Schwan resides in [place] per [employer].   On 11/09/20 update request sent to [facility] SW to inquire about transition plans.   Will continue to follow.   Marthenia Rolling, MSN, RN,BSN [place] Acute Care Coordinator [phone] [facility]) [phone]  (Toll free office)

## 2020-11-24 DIAGNOSIS — S82891D Other fracture of right lower leg, subsequent encounter for closed fracture with routine healing: Secondary | ICD-10-CM | POA: Diagnosis not present

## 2020-11-24 DIAGNOSIS — R262 Difficulty in walking, not elsewhere classified: Secondary | ICD-10-CM | POA: Diagnosis not present

## 2020-11-24 DIAGNOSIS — I5032 Chronic diastolic (congestive) heart failure: Secondary | ICD-10-CM | POA: Diagnosis not present

## 2020-11-24 DIAGNOSIS — E785 Hyperlipidemia, unspecified: Secondary | ICD-10-CM | POA: Diagnosis not present

## 2020-11-25 ENCOUNTER — Other Ambulatory Visit: Payer: Self-pay | Admitting: *Deleted

## 2020-11-25 DIAGNOSIS — I10 Essential (primary) hypertension: Secondary | ICD-10-CM

## 2020-11-25 NOTE — Patient Outreach (Signed)
[facility] Post- Acute Care Coordinator follow up. Member screened for potential [facility] Care Management needs. Per [facility] Mrs. Kopecky resides in [facility].  Communication sent to [facility] to inquire about transition plans and the need for potential [facility] services.  Telephone call made to Mrs. Lennox Grumbles [phone]. No answer. HIPAA compliant voicemail message left to request return call.   Will continue to follow.    Marthenia Rolling, MSN, RN,BSN [place] Acute Care Coordinator [phone] [facility]) [phone]  (Toll free office)

## 2020-11-25 NOTE — Patient Outreach (Signed)
Telephone call received from Kristen Loader. Mrs. Crutchley remains in [facility]. States she is going home Friday, 11/26/20.  Discussed [facility] Care Management follow up. Discussed that Mrs. Dixson was active with [facility] Management services last year. Mrs. Smotherman is agreeable to [place] Management re-engagement.  Mrs. Roma confirms she lives alone. States her daughter Precious Bard lives 5 minutes away. Mrs. Cusic states she sometimes has concerns with transportation to MD appointment at times when her daughter Precious Bard is unable to take her.   Agreeable to follow up for potential transportation needs ([place]) as well a [facility] Care Management RNCM referral. Mrs. Berberich confirms best contact number is [phone]. States she does not need Probation officer to contact her daughter Precious Bard to discuss [facility] re-engagement.   Mrs. Jennings will transition to home on 11/26/20. Writer will follow up with [facility] SW to inquire about home health arrangements.   Marthenia Rolling, MSN, RN,BSN [place] Acute Care Coordinator [phone] [facility]) [phone]  (Toll free office)

## 2020-11-26 DIAGNOSIS — S82891D Other fracture of right lower leg, subsequent encounter for closed fracture with routine healing: Secondary | ICD-10-CM | POA: Diagnosis not present

## 2020-11-26 DIAGNOSIS — E785 Hyperlipidemia, unspecified: Secondary | ICD-10-CM | POA: Diagnosis not present

## 2020-11-26 DIAGNOSIS — Z79810 Long term (current) use of selective estrogen receptor modulators (SERMs): Secondary | ICD-10-CM | POA: Diagnosis not present

## 2020-11-26 DIAGNOSIS — I5032 Chronic diastolic (congestive) heart failure: Secondary | ICD-10-CM | POA: Diagnosis not present

## 2020-11-26 DIAGNOSIS — Z952 Presence of prosthetic heart valve: Secondary | ICD-10-CM | POA: Diagnosis not present

## 2020-11-26 DIAGNOSIS — Z9181 History of falling: Secondary | ICD-10-CM | POA: Diagnosis not present

## 2020-11-26 DIAGNOSIS — N182 Chronic kidney disease, stage 2 (mild): Secondary | ICD-10-CM | POA: Diagnosis not present

## 2020-11-26 DIAGNOSIS — I130 Hypertensive heart and chronic kidney disease with heart failure and stage 1 through stage 4 chronic kidney disease, or unspecified chronic kidney disease: Secondary | ICD-10-CM | POA: Diagnosis not present

## 2020-11-29 ENCOUNTER — Telehealth: Payer: Self-pay | Admitting: *Deleted

## 2020-11-29 DIAGNOSIS — E785 Hyperlipidemia, unspecified: Secondary | ICD-10-CM | POA: Diagnosis not present

## 2020-11-29 DIAGNOSIS — S82891D Other fracture of right lower leg, subsequent encounter for closed fracture with routine healing: Secondary | ICD-10-CM | POA: Diagnosis not present

## 2020-11-29 DIAGNOSIS — I130 Hypertensive heart and chronic kidney disease with heart failure and stage 1 through stage 4 chronic kidney disease, or unspecified chronic kidney disease: Secondary | ICD-10-CM | POA: Diagnosis not present

## 2020-11-29 DIAGNOSIS — I5032 Chronic diastolic (congestive) heart failure: Secondary | ICD-10-CM | POA: Diagnosis not present

## 2020-11-29 DIAGNOSIS — N182 Chronic kidney disease, stage 2 (mild): Secondary | ICD-10-CM | POA: Diagnosis not present

## 2020-11-29 DIAGNOSIS — Z952 Presence of prosthetic heart valve: Secondary | ICD-10-CM | POA: Diagnosis not present

## 2020-11-29 NOTE — Telephone Encounter (Signed)
   Telephone encounter was:  Successful.  11/29/2020 Name: Megan Pitts MRN: [id number] DOB: 24-Nov-1927  Megan Pitts is a 87 y.o. year old female who is a primary care patient of Ginger Organ., MD . The community resource team was consulted for assistance with Transportation Needs Patient is not wanting transportaion at this time she will reach to careguide at number provided for further assitance if she needs it   Care guide performed the following interventions: Patient provided with information about care guide support team and interviewed to confirm resource needs Follow up call placed to community resources to determine status of patients referral.  Follow Up Plan:  No further follow up planned at this time. The patient has been provided with needed resources.  [place], Care Management  [phone] [address] , [place] [address] Email : [email or web address]

## 2020-11-30 DIAGNOSIS — S82891D Other fracture of right lower leg, subsequent encounter for closed fracture with routine healing: Secondary | ICD-10-CM | POA: Diagnosis not present

## 2020-11-30 DIAGNOSIS — E785 Hyperlipidemia, unspecified: Secondary | ICD-10-CM | POA: Diagnosis not present

## 2020-11-30 DIAGNOSIS — N182 Chronic kidney disease, stage 2 (mild): Secondary | ICD-10-CM | POA: Diagnosis not present

## 2020-11-30 DIAGNOSIS — Z952 Presence of prosthetic heart valve: Secondary | ICD-10-CM | POA: Diagnosis not present

## 2020-11-30 DIAGNOSIS — I130 Hypertensive heart and chronic kidney disease with heart failure and stage 1 through stage 4 chronic kidney disease, or unspecified chronic kidney disease: Secondary | ICD-10-CM | POA: Diagnosis not present

## 2020-11-30 DIAGNOSIS — I5032 Chronic diastolic (congestive) heart failure: Secondary | ICD-10-CM | POA: Diagnosis not present

## 2020-12-01 ENCOUNTER — Other Ambulatory Visit: Payer: Self-pay | Admitting: *Deleted

## 2020-12-01 NOTE — Patient Outreach (Signed)
[facility]) Care Management  12/01/2020  AUBRIELLE STROUD Dec 12, 1927 [id number]  Referral Received 11/18 Transition of Care-unsuccessful Initial outreach call  RN attempted outreach call however unsuccessful. RN able to leave a HIPAA approved voice message requesting a call back.  RN will send an outreach letter and rescheduled another outreach call over the next week.  Raina Mina, RN Care Management Coordinator [place] Office [phone]

## 2020-12-03 DIAGNOSIS — E785 Hyperlipidemia, unspecified: Secondary | ICD-10-CM | POA: Diagnosis not present

## 2020-12-03 DIAGNOSIS — Z952 Presence of prosthetic heart valve: Secondary | ICD-10-CM | POA: Diagnosis not present

## 2020-12-03 DIAGNOSIS — I130 Hypertensive heart and chronic kidney disease with heart failure and stage 1 through stage 4 chronic kidney disease, or unspecified chronic kidney disease: Secondary | ICD-10-CM | POA: Diagnosis not present

## 2020-12-03 DIAGNOSIS — S82891D Other fracture of right lower leg, subsequent encounter for closed fracture with routine healing: Secondary | ICD-10-CM | POA: Diagnosis not present

## 2020-12-03 DIAGNOSIS — N182 Chronic kidney disease, stage 2 (mild): Secondary | ICD-10-CM | POA: Diagnosis not present

## 2020-12-03 DIAGNOSIS — I5032 Chronic diastolic (congestive) heart failure: Secondary | ICD-10-CM | POA: Diagnosis not present

## 2020-12-06 DIAGNOSIS — E785 Hyperlipidemia, unspecified: Secondary | ICD-10-CM | POA: Diagnosis not present

## 2020-12-06 DIAGNOSIS — N182 Chronic kidney disease, stage 2 (mild): Secondary | ICD-10-CM | POA: Diagnosis not present

## 2020-12-06 DIAGNOSIS — I130 Hypertensive heart and chronic kidney disease with heart failure and stage 1 through stage 4 chronic kidney disease, or unspecified chronic kidney disease: Secondary | ICD-10-CM | POA: Diagnosis not present

## 2020-12-06 DIAGNOSIS — I5032 Chronic diastolic (congestive) heart failure: Secondary | ICD-10-CM | POA: Diagnosis not present

## 2020-12-06 DIAGNOSIS — Z952 Presence of prosthetic heart valve: Secondary | ICD-10-CM | POA: Diagnosis not present

## 2020-12-06 DIAGNOSIS — S82891D Other fracture of right lower leg, subsequent encounter for closed fracture with routine healing: Secondary | ICD-10-CM | POA: Diagnosis not present

## 2020-12-07 ENCOUNTER — Other Ambulatory Visit: Payer: Self-pay | Admitting: *Deleted

## 2020-12-07 DIAGNOSIS — E785 Hyperlipidemia, unspecified: Secondary | ICD-10-CM | POA: Diagnosis not present

## 2020-12-07 DIAGNOSIS — I5032 Chronic diastolic (congestive) heart failure: Secondary | ICD-10-CM | POA: Diagnosis not present

## 2020-12-07 DIAGNOSIS — S82851D Displaced trimalleolar fracture of right lower leg, subsequent encounter for closed fracture with routine healing: Secondary | ICD-10-CM | POA: Diagnosis not present

## 2020-12-07 DIAGNOSIS — N182 Chronic kidney disease, stage 2 (mild): Secondary | ICD-10-CM | POA: Diagnosis not present

## 2020-12-07 DIAGNOSIS — I130 Hypertensive heart and chronic kidney disease with heart failure and stage 1 through stage 4 chronic kidney disease, or unspecified chronic kidney disease: Secondary | ICD-10-CM | POA: Diagnosis not present

## 2020-12-07 DIAGNOSIS — Z952 Presence of prosthetic heart valve: Secondary | ICD-10-CM | POA: Diagnosis not present

## 2020-12-07 DIAGNOSIS — S82891D Other fracture of right lower leg, subsequent encounter for closed fracture with routine healing: Secondary | ICD-10-CM | POA: Diagnosis not present

## 2020-12-07 NOTE — Patient Outreach (Signed)
[place] [facility]) Care Management  12/07/2020  EZELL MELIKIAN Jan 16, 1927 [id number]  Telephone Assessment-RE: call back  RN spoke with pt briefly concerning [facility] services. Pt receptive to the information discussed and enrollment. Pt providing information to related inquires and questions however phone line constantly being interrupted with someone dialing a number. Pt states she was home alone. Due to the interruption requested call back tomorrow morning to completed the initial assessment. Will enroll pt into the HF program and focus on her ongoing recovery of her right ankle fracture.   Will follow up tomorrow to complete the initial assessment via [facility] program and service.   Raina Mina, RN Care Management Coordinator [place] Office [phone]

## 2020-12-08 ENCOUNTER — Encounter: Payer: Self-pay | Admitting: *Deleted

## 2020-12-08 ENCOUNTER — Other Ambulatory Visit: Payer: Self-pay | Admitting: *Deleted

## 2020-12-08 DIAGNOSIS — E785 Hyperlipidemia, unspecified: Secondary | ICD-10-CM | POA: Diagnosis not present

## 2020-12-08 DIAGNOSIS — Z952 Presence of prosthetic heart valve: Secondary | ICD-10-CM | POA: Diagnosis not present

## 2020-12-08 DIAGNOSIS — N182 Chronic kidney disease, stage 2 (mild): Secondary | ICD-10-CM | POA: Diagnosis not present

## 2020-12-08 DIAGNOSIS — S82891D Other fracture of right lower leg, subsequent encounter for closed fracture with routine healing: Secondary | ICD-10-CM | POA: Diagnosis not present

## 2020-12-08 DIAGNOSIS — I130 Hypertensive heart and chronic kidney disease with heart failure and stage 1 through stage 4 chronic kidney disease, or unspecified chronic kidney disease: Secondary | ICD-10-CM | POA: Diagnosis not present

## 2020-12-08 DIAGNOSIS — I5032 Chronic diastolic (congestive) heart failure: Secondary | ICD-10-CM | POA: Diagnosis not present

## 2020-12-08 DIAGNOSIS — N1831 Chronic kidney disease, stage 3a: Secondary | ICD-10-CM | POA: Diagnosis not present

## 2020-12-08 DIAGNOSIS — I10 Essential (primary) hypertension: Secondary | ICD-10-CM | POA: Diagnosis not present

## 2020-12-08 DIAGNOSIS — M810 Age-related osteoporosis without current pathological fracture: Secondary | ICD-10-CM | POA: Diagnosis not present

## 2020-12-08 NOTE — Patient Instructions (Signed)
Visit Information  Thank you for taking time to visit with me today. Please don't hesitate to contact me if I can be of assistance to you before our next scheduled telephone appointment.   Following is a copy of your care plan:  Care Plan : RN Care Manager  Updates made by Tobi Bastos, RN since 12/08/2020 12:00 AM     Problem: Knowledge Deficit related to CHF and care coordination needs   Priority: High     Long-Range Goal: Development plan of care for Management of CHF   Start Date: 12/08/2020  Expected End Date: 03/08/2021  Priority: High  Note:   Current Barriers:  Knowledge Deficits related to plan of care for management of CHF   RNCM Clinical Goal(s):  Patient will verbalize understanding of plan for management of CHF as evidenced by verbalizing an understanding of the plan discussed today. take all medications exactly as prescribed and will call provider for medication related questions as evidenced by review of all medication today attend all scheduled medical appointments: as reviewed today as evidenced by confirming sufficient transportation from her primary caregiver daughter [name])  Interventions: Inter-disciplinary care team collaboration (see longitudinal plan of care) Evaluation of current treatment plan related to  self management and patient's adherence to plan as established by provider   Heart Failure Interventions:  (Status:  New goal.) Long Term Goal Basic overview and discussion of pathophysiology of Heart Failure reviewed Reviewed Heart Failure Action Plan in depth and provided written copy Discussed importance of daily weight and advised patient to weigh and record daily Discussed the importance of keeping all appointments with provider Provided patient with education about the role of exercise in the management of heart failure Screening for signs and symptoms of depression related to chronic disease state   Patient Goals/Self-Care  Activities: Take all medications as prescribed Attend all scheduled provider appointments Call pharmacy for medication refills 3-7 days in advance of running out of medications Perform IADL's (shopping, preparing meals, housekeeping, managing finances) independently Call provider office for new concerns or questions   Follow Up Plan:  Telephone follow up appointment with care management team member scheduled for:  Dec 2022 The patient has been provided with contact information for the care management team and has been advised to call with any health related questions or concerns.       The patient verbalized understanding of instructions, educational materials, and care plan provided today and agreed to receive a mailed copy of patient instructions, educational materials, and care plan.   SIGNATURE   Raina Mina, RN Care Management Coordinator [place] Office [phone]

## 2020-12-08 NOTE — Patient Outreach (Signed)
[place] [facility]) Care Management  12/08/2020  Megan Pitts 10-29-1927 [id number]   [facility] [facility]) Care Management Telephonic RN Care Manager Note   12/08/2020 Name:  Megan Pitts MRN:  [id number] DOB:  10-Dec-1927  Subjective: Megan Pitts is an 87 y.o. year old female who is a primary patient of Ginger Organ., MD. The care management team was consulted for assistance with care management and/or care coordination needs.    Telephonic RN Care Manager completed Telephone Visit today.   Objective:  Medications Reviewed Today     Reviewed by Tobi Bastos, RN (Registered Nurse) on 12/08/20 at 1815  Med List Status: <None>   Medication Order Taking? Sig Documenting Provider Last Dose Status Informant  amLODipine (NORVASC) 10 MG tablet [id number] Yes Take 10 mg by mouth daily. [provider] Taking Active Family Member  aspirin 81 MG tablet [id number] Yes Take 81 mg by mouth daily.   [provider] Taking Active Family Member  bisacodyl (DULCOLAX) 10 MG suppository [id number] Yes Place 1 suppository (10 mg total) rectally daily as needed for moderate constipation. Lavina Hamman, MD Taking Active   docusate sodium (COLACE) 100 MG capsule [id number] Yes Take 1 capsule (100 mg total) by mouth 2 (two) times daily as needed for mild constipation. Lavina Hamman, MD Taking Active   donepezil (ARICEPT) 10 MG tablet [id number] Yes Take 10 mg by mouth at bedtime.  [provider] Taking Active Family Member           Med Note Brett Albino, [place] May 05, 2015  4:08 PM)    enoxaparin (LOVENOX) 40 MG/0.4ML injection [id number]  Inject 0.4 mLs (40 mg total) into the skin daily. Ethelda Chick, [place]  Expired 10/14/20 2359   hydrochlorothiazide (HYDRODIURIL) 25 MG tablet [id number] Yes Take 25 mg by mouth daily. [provider] Taking Active Family Member  HYDROcodone-acetaminophen (NORCO) 5-325 MG tablet [id number] No Take 1-2  tablets by mouth every 6 (six) hours as needed for severe pain.  Patient not taking: Reported on 12/08/2020   Ethelda Chick, PA-C Not Taking Active   methocarbamol (ROBAXIN) 500 MG tablet [id number] Yes Take 1 tablet by mouth as needed. For spasms/muscle tension [provider] Taking Active Family Member  olmesartan (BENICAR) 40 MG tablet [id number] Yes Take 40 mg by mouth daily. [provider] Taking Active Family Member  psyllium (HYDROCIL/METAMUCIL) 95 % PACK [id number] Yes Take 1 packet by mouth daily. Lavina Hamman, MD Taking Active   Vitamin D, Ergocalciferol, (DRISDOL) 50000 units CAPS capsule [id number] Yes Take 50,000 Units by mouth every 7 (seven) days. On Sundays [provider] Taking Active Family Member             SDOH:  (Social Determinants of Health) assessments and interventions performed:  SDOH Interventions    Flowsheet Row Most Recent Value  SDOH Interventions   Food Insecurity Interventions Intervention Not Indicated  Transportation Interventions Intervention Not Indicated       Care Plan  Review of patient past medical history, allergies, medications, health status, was performed as part of comprehensive evaluation for care management services.   Care Plan : RN Care Manager  Updates made by Tobi Bastos, RN since 12/08/2020 12:00 AM     Problem: Knowledge Deficit related to CHF and care coordination needs   Priority: High     Long-Range Goal: Development plan of care for Management of CHF  Start Date: 12/08/2020  Expected End Date: 03/08/2021  Priority: High  Note:   Current Barriers:  Knowledge Deficits related to plan of care for management of CHF   RNCM Clinical Goal(s):  Patient will verbalize understanding of plan for management of CHF as evidenced by verbalizing an understanding of the plan discussed today. take all medications exactly as prescribed and will call provider for medication related questions  as evidenced by review of all medication today attend all scheduled medical appointments: as reviewed today as evidenced by confirming sufficient transportation from her primary caregiver daughter [name])  Interventions: Inter-disciplinary care team collaboration (see longitudinal plan of care) Evaluation of current treatment plan related to  self management and patient's adherence to plan as established by provider   Heart Failure Interventions:  (Status:  New goal.) Long Term Goal Basic overview and discussion of pathophysiology of Heart Failure reviewed Reviewed Heart Failure Action Plan in depth and provided written copy Discussed importance of daily weight and advised patient to weigh and record daily Discussed the importance of keeping all appointments with provider Provided patient with education about the role of exercise in the management of heart failure Screening for signs and symptoms of depression related to chronic disease state   Patient Goals/Self-Care Activities: Take all medications as prescribed Attend all scheduled provider appointments Call pharmacy for medication refills 3-7 days in advance of running out of medications Perform IADL's (shopping, preparing meals, housekeeping, managing finances) independently Call provider office for new concerns or questions   Follow Up Plan:  Telephone follow up appointment with care management team member scheduled for:  Dec 2022 The patient has been provided with contact information for the care management team and has been advised to call with any health related questions or concerns.         Raina Mina, RN Care Management Coordinator [place] Office [phone]

## 2020-12-09 DIAGNOSIS — M810 Age-related osteoporosis without current pathological fracture: Secondary | ICD-10-CM | POA: Diagnosis not present

## 2020-12-09 DIAGNOSIS — K5900 Constipation, unspecified: Secondary | ICD-10-CM | POA: Diagnosis not present

## 2020-12-09 DIAGNOSIS — I129 Hypertensive chronic kidney disease with stage 1 through stage 4 chronic kidney disease, or unspecified chronic kidney disease: Secondary | ICD-10-CM | POA: Diagnosis not present

## 2020-12-09 DIAGNOSIS — S82891A Other fracture of right lower leg, initial encounter for closed fracture: Secondary | ICD-10-CM | POA: Diagnosis not present

## 2020-12-09 DIAGNOSIS — H6123 Impacted cerumen, bilateral: Secondary | ICD-10-CM | POA: Diagnosis not present

## 2020-12-13 DIAGNOSIS — E785 Hyperlipidemia, unspecified: Secondary | ICD-10-CM | POA: Diagnosis not present

## 2020-12-13 DIAGNOSIS — S82891D Other fracture of right lower leg, subsequent encounter for closed fracture with routine healing: Secondary | ICD-10-CM | POA: Diagnosis not present

## 2020-12-13 DIAGNOSIS — I5032 Chronic diastolic (congestive) heart failure: Secondary | ICD-10-CM | POA: Diagnosis not present

## 2020-12-13 DIAGNOSIS — I130 Hypertensive heart and chronic kidney disease with heart failure and stage 1 through stage 4 chronic kidney disease, or unspecified chronic kidney disease: Secondary | ICD-10-CM | POA: Diagnosis not present

## 2020-12-13 DIAGNOSIS — N182 Chronic kidney disease, stage 2 (mild): Secondary | ICD-10-CM | POA: Diagnosis not present

## 2020-12-13 DIAGNOSIS — Z952 Presence of prosthetic heart valve: Secondary | ICD-10-CM | POA: Diagnosis not present

## 2020-12-17 DIAGNOSIS — E785 Hyperlipidemia, unspecified: Secondary | ICD-10-CM | POA: Diagnosis not present

## 2020-12-17 DIAGNOSIS — S82891D Other fracture of right lower leg, subsequent encounter for closed fracture with routine healing: Secondary | ICD-10-CM | POA: Diagnosis not present

## 2020-12-17 DIAGNOSIS — I5032 Chronic diastolic (congestive) heart failure: Secondary | ICD-10-CM | POA: Diagnosis not present

## 2020-12-17 DIAGNOSIS — Z952 Presence of prosthetic heart valve: Secondary | ICD-10-CM | POA: Diagnosis not present

## 2020-12-17 DIAGNOSIS — N182 Chronic kidney disease, stage 2 (mild): Secondary | ICD-10-CM | POA: Diagnosis not present

## 2020-12-17 DIAGNOSIS — I130 Hypertensive heart and chronic kidney disease with heart failure and stage 1 through stage 4 chronic kidney disease, or unspecified chronic kidney disease: Secondary | ICD-10-CM | POA: Diagnosis not present

## 2020-12-20 DIAGNOSIS — S82891D Other fracture of right lower leg, subsequent encounter for closed fracture with routine healing: Secondary | ICD-10-CM | POA: Diagnosis not present

## 2020-12-20 DIAGNOSIS — E785 Hyperlipidemia, unspecified: Secondary | ICD-10-CM | POA: Diagnosis not present

## 2020-12-20 DIAGNOSIS — I130 Hypertensive heart and chronic kidney disease with heart failure and stage 1 through stage 4 chronic kidney disease, or unspecified chronic kidney disease: Secondary | ICD-10-CM | POA: Diagnosis not present

## 2020-12-20 DIAGNOSIS — I5032 Chronic diastolic (congestive) heart failure: Secondary | ICD-10-CM | POA: Diagnosis not present

## 2020-12-20 DIAGNOSIS — N182 Chronic kidney disease, stage 2 (mild): Secondary | ICD-10-CM | POA: Diagnosis not present

## 2020-12-20 DIAGNOSIS — Z952 Presence of prosthetic heart valve: Secondary | ICD-10-CM | POA: Diagnosis not present

## 2020-12-23 DIAGNOSIS — E785 Hyperlipidemia, unspecified: Secondary | ICD-10-CM | POA: Diagnosis not present

## 2020-12-23 DIAGNOSIS — S82891D Other fracture of right lower leg, subsequent encounter for closed fracture with routine healing: Secondary | ICD-10-CM | POA: Diagnosis not present

## 2020-12-23 DIAGNOSIS — Z952 Presence of prosthetic heart valve: Secondary | ICD-10-CM | POA: Diagnosis not present

## 2020-12-23 DIAGNOSIS — I5032 Chronic diastolic (congestive) heart failure: Secondary | ICD-10-CM | POA: Diagnosis not present

## 2020-12-23 DIAGNOSIS — N182 Chronic kidney disease, stage 2 (mild): Secondary | ICD-10-CM | POA: Diagnosis not present

## 2020-12-23 DIAGNOSIS — I130 Hypertensive heart and chronic kidney disease with heart failure and stage 1 through stage 4 chronic kidney disease, or unspecified chronic kidney disease: Secondary | ICD-10-CM | POA: Diagnosis not present

## 2020-12-26 DIAGNOSIS — I5032 Chronic diastolic (congestive) heart failure: Secondary | ICD-10-CM | POA: Diagnosis not present

## 2020-12-26 DIAGNOSIS — N182 Chronic kidney disease, stage 2 (mild): Secondary | ICD-10-CM | POA: Diagnosis not present

## 2020-12-26 DIAGNOSIS — Z9181 History of falling: Secondary | ICD-10-CM | POA: Diagnosis not present

## 2020-12-26 DIAGNOSIS — I130 Hypertensive heart and chronic kidney disease with heart failure and stage 1 through stage 4 chronic kidney disease, or unspecified chronic kidney disease: Secondary | ICD-10-CM | POA: Diagnosis not present

## 2020-12-26 DIAGNOSIS — Z79810 Long term (current) use of selective estrogen receptor modulators (SERMs): Secondary | ICD-10-CM | POA: Diagnosis not present

## 2020-12-26 DIAGNOSIS — Z952 Presence of prosthetic heart valve: Secondary | ICD-10-CM | POA: Diagnosis not present

## 2020-12-26 DIAGNOSIS — S82891D Other fracture of right lower leg, subsequent encounter for closed fracture with routine healing: Secondary | ICD-10-CM | POA: Diagnosis not present

## 2020-12-26 DIAGNOSIS — E785 Hyperlipidemia, unspecified: Secondary | ICD-10-CM | POA: Diagnosis not present

## 2020-12-28 DIAGNOSIS — N182 Chronic kidney disease, stage 2 (mild): Secondary | ICD-10-CM | POA: Diagnosis not present

## 2020-12-28 DIAGNOSIS — Z952 Presence of prosthetic heart valve: Secondary | ICD-10-CM | POA: Diagnosis not present

## 2020-12-28 DIAGNOSIS — I5032 Chronic diastolic (congestive) heart failure: Secondary | ICD-10-CM | POA: Diagnosis not present

## 2020-12-28 DIAGNOSIS — I130 Hypertensive heart and chronic kidney disease with heart failure and stage 1 through stage 4 chronic kidney disease, or unspecified chronic kidney disease: Secondary | ICD-10-CM | POA: Diagnosis not present

## 2020-12-28 DIAGNOSIS — E785 Hyperlipidemia, unspecified: Secondary | ICD-10-CM | POA: Diagnosis not present

## 2020-12-28 DIAGNOSIS — S82891D Other fracture of right lower leg, subsequent encounter for closed fracture with routine healing: Secondary | ICD-10-CM | POA: Diagnosis not present

## 2020-12-30 ENCOUNTER — Other Ambulatory Visit: Payer: Self-pay | Admitting: *Deleted

## 2020-12-30 NOTE — Patient Instructions (Signed)
Visit Information  Thank you for taking time to visit with me today. Please don't hesitate to contact me if I can be of assistance to you before our next scheduled telephone appointment.    

## 2020-12-30 NOTE — Patient Outreach (Signed)
[facility]) Care Management  [name]  12/30/2020   Megan Pitts [birth year] [id number]  Telephone Assessment-Successful  RN spoke with pt today concerning update on pt's ongoing management of care. Pt doing well with no residual symptoms of swelling or related to her HF.  Will follow up next month with ongoing care management services.  Encounter Medications:  Outpatient Encounter Medications as of 12/30/2020  Medication Sig   amLODipine (NORVASC) 10 MG tablet Take 10 mg by mouth daily.   aspirin 81 MG tablet Take 81 mg by mouth daily.     bisacodyl (DULCOLAX) 10 MG suppository Place 1 suppository (10 mg total) rectally daily as needed for moderate constipation.   docusate sodium (COLACE) 100 MG capsule Take 1 capsule (100 mg total) by mouth 2 (two) times daily as needed for mild constipation.   donepezil (ARICEPT) 10 MG tablet Take 10 mg by mouth at bedtime.    enoxaparin (LOVENOX) 40 MG/0.4ML injection Inject 0.4 mLs (40 mg total) into the skin daily.   hydrochlorothiazide (HYDRODIURIL) 25 MG tablet Take 25 mg by mouth daily.   HYDROcodone-acetaminophen (NORCO) 5-325 MG tablet Take 1-2 tablets by mouth every 6 (six) hours as needed for severe pain. (Patient not taking: Reported on 12/08/2020)   methocarbamol (ROBAXIN) 500 MG tablet Take 1 tablet by mouth as needed. For spasms/muscle tension   olmesartan (BENICAR) 40 MG tablet Take 40 mg by mouth daily.   psyllium (HYDROCIL/METAMUCIL) 95 % PACK Take 1 packet by mouth daily.   Vitamin D, Ergocalciferol, (DRISDOL) 50000 units CAPS capsule Take 50,000 Units by mouth every 7 (seven) days. On Sundays   No facility-administered encounter medications on file as of 12/30/2020.    Functional Status:  In your present state of health, do you have any difficulty performing the following activities: 12/08/2020 09/11/2020  Hearing? N Y  Vision? N N  Difficulty concentrating or making decisions? N N  Walking or  climbing stairs? Y N  Comment uses walker -  Dressing or bathing? N N  Doing errands, shopping? Y N  Comment assistance with this task -  Preparing Food and eating ? N -  Using the Toilet? N -  In the past six months, have you accidently leaked urine? N -  Do you have problems with loss of bowel control? N -  Managing your Medications? Y -  Comment assistance with this task -  Managing your Finances? N -  Housekeeping or managing your Housekeeping? N -  Some recent data might be hidden    Fall/Depression Screening: Fall Risk  12/08/2020 06/30/2019  Falls in the past year? 0 1  Number falls in past yr: - 1  Injury with Fall? - 1  Risk for fall due to : History of fall(s) History of fall(s);Impaired balance/gait;Impaired mobility  Follow up Falls prevention discussed;Education provided Education provided;Falls prevention discussed   PHQ 2/9 Scores 12/08/2020 06/30/2019  PHQ - 2 Score 0 0    Assessment:   Care Plan Care Plan : RN Care Manager  Updates made by Tobi Bastos, RN since 12/30/2020 12:00 AM     Problem: Knowledge Deficit related to CHF and care coordination needs   Priority: High     Long-Range Goal: Development plan of care for Management of CHF   Start Date: 12/08/2020  Expected End Date: 03/08/2021  This Visit's Progress: On track  Priority: High  Note:   Current Barriers:  Knowledge Deficits related to plan of care  for management of CHF   RNCM Clinical Goal(s):  Patient will verbalize understanding of plan for management of CHF as evidenced by verbalizing an understanding of the plan discussed today. take all medications exactly as prescribed and will call provider for medication related questions as evidenced by reviewed medication today with no changes attend all scheduled medical appointments: as reviewed today as evidenced by confirming sufficient transportation from her primary caregiver daughter [name])  Interventions: Inter-disciplinary care  team collaboration (see longitudinal plan of care) Evaluation of current treatment plan related to  self management and patient's adherence to plan as established by provider   Heart Failure Interventions:  (Status:  Goal on track:  Yes.) Long Term Goal Provided education on low sodium diet Advised patient to weigh each morning after emptying bladder Discussed importance of daily weight and advised patient to weigh and record daily Discussed the importance of keeping all appointments with provider Provided patient with education about the role of exercise in the management of heart failure Update-12/22: Pt continue to do well with no acute needs at this time. Will continue to encourage ongoing adherence to the reviewed and discussed plan of care. Noted updates presented in the plan of care. Weights reported 158-159 lbs with no acute symptoms or swelling reported.  Patient Goals/Self-Care Activities: Take all medications as prescribed Attend all scheduled provider appointments Call pharmacy for medication refills 3-7 days in advance of running out of medications Perform IADL's (shopping, preparing meals, housekeeping, managing finances) independently Call provider office for new concerns or questions   Follow Up Plan:  Telephone follow up appointment with care management team member scheduled for:  Jan 2023 The patient has been provided with contact information for the care management team and has been advised to call with any health related questions or concerns.      Raina Mina, RN Care Management Coordinator [place] Office [phone]

## 2021-01-01 ENCOUNTER — Encounter (HOSPITAL_BASED_OUTPATIENT_CLINIC_OR_DEPARTMENT_OTHER): Payer: Self-pay | Admitting: Emergency Medicine

## 2021-01-01 ENCOUNTER — Other Ambulatory Visit: Payer: Self-pay

## 2021-01-01 ENCOUNTER — Emergency Department (HOSPITAL_BASED_OUTPATIENT_CLINIC_OR_DEPARTMENT_OTHER)
Admission: EM | Admit: 2021-01-01 | Discharge: 2021-01-01 | Disposition: A | Discharge status: Home / Self Care | Payer: Medicare Other | Attending: Emergency Medicine | Admitting: Emergency Medicine

## 2021-01-01 ENCOUNTER — Emergency Department (HOSPITAL_BASED_OUTPATIENT_CLINIC_OR_DEPARTMENT_OTHER): Payer: Medicare Other

## 2021-01-01 DIAGNOSIS — I130 Hypertensive heart and chronic kidney disease with heart failure and stage 1 through stage 4 chronic kidney disease, or unspecified chronic kidney disease: Secondary | ICD-10-CM | POA: Insufficient documentation

## 2021-01-01 DIAGNOSIS — W01190A Fall on same level from slipping, tripping and stumbling with subsequent striking against furniture, initial encounter: Secondary | ICD-10-CM | POA: Insufficient documentation

## 2021-01-01 DIAGNOSIS — Z23 Encounter for immunization: Secondary | ICD-10-CM | POA: Insufficient documentation

## 2021-01-01 DIAGNOSIS — S0101XA Laceration without foreign body of scalp, initial encounter: Secondary | ICD-10-CM | POA: Insufficient documentation

## 2021-01-01 DIAGNOSIS — Z96661 Presence of right artificial ankle joint: Secondary | ICD-10-CM | POA: Insufficient documentation

## 2021-01-01 DIAGNOSIS — Z7982 Long term (current) use of aspirin: Secondary | ICD-10-CM | POA: Diagnosis not present

## 2021-01-01 DIAGNOSIS — N182 Chronic kidney disease, stage 2 (mild): Secondary | ICD-10-CM | POA: Insufficient documentation

## 2021-01-01 DIAGNOSIS — I5032 Chronic diastolic (congestive) heart failure: Secondary | ICD-10-CM | POA: Insufficient documentation

## 2021-01-01 DIAGNOSIS — W19XXXA Unspecified fall, initial encounter: Secondary | ICD-10-CM

## 2021-01-01 DIAGNOSIS — Z79899 Other long term (current) drug therapy: Secondary | ICD-10-CM | POA: Diagnosis not present

## 2021-01-01 DIAGNOSIS — S199XXA Unspecified injury of neck, initial encounter: Secondary | ICD-10-CM | POA: Diagnosis not present

## 2021-01-01 DIAGNOSIS — S0990XA Unspecified injury of head, initial encounter: Secondary | ICD-10-CM | POA: Diagnosis not present

## 2021-01-01 MED ORDER — LIDOCAINE-EPINEPHRINE (PF) 2 %-1:200000 IJ SOLN
20.0000 mL | Freq: Once | INTRAMUSCULAR | Status: DC
  Filled 2021-01-01: qty 20

## 2021-01-01 MED ORDER — TETANUS-DIPHTH-ACELL PERTUSSIS 5-2.5-18.5 LF-MCG/0.5 IM SUSY
0.5000 mL | PREFILLED_SYRINGE | Freq: Once | INTRAMUSCULAR | Status: AC
  Administered 2021-01-01: 13:00:00 0.5 mL via INTRAMUSCULAR
  Filled 2021-01-01: qty 0.5

## 2021-01-01 NOTE — ED Triage Notes (Signed)
Pt lives at home alone. Pt bent over to pick up a  poinsettia leaf and fell forward hitting her head on a coffee table at approx 1100 hours. Pt denis LOC. Pt denis blurry vision and N/V. Pt denis pain.    Pt is NOT on thinners.

## 2021-01-01 NOTE — Discharge Instructions (Addendum)
You were evaluated in the Emergency Department and after careful evaluation, we did not find any emergent condition requiring admission or further testing in the hospital.  Your exam/testing today was overall reassuring.  Your CT imaging of the head and cervical spine did not reveal acute intracranial abnormality or acute fracture of the cervical spine.  It did show old healing fractures of the cervical spine.  Your wound was closed with 3 staples.  Staples will need to be removed in around 10 days and can be removed by your PCP, urgent care or the emergency department.  Wound care instructions have been attached.  Please return to the Emergency Department if you experience any worsening of your condition.  Thank you for allowing [place] to be a part of your care.

## 2021-01-01 NOTE — ED Provider Notes (Signed)
[facility] EMERGENCY DEPT Provider Note   CSN: [id number] Arrival date & time: 01/01/21  1118     History Chief Complaint  Patient presents with   Megan Pitts is a 87 y.o. female.   Fall   87 year old female with a history of atrial fibrillation and aortic stenosis status post AVR in 2007, GERD, HLD, HTN not on anticoagulation who presents to the emergency department after a mechanical fall.  The patient presents as a nonlevel trauma.  She states that earlier today she bent over to pick up a poinsettia leaf and fell backwards.  She struck the top of her head on a coffee table and sustained a gash to her scalp.  No loss of consciousness.  She denies any neurologic deficits, changes in vision.  She denies any other pain or complaints.  She denies any neck pain.  She arrived to the [facility] emergency department GCS 15, ABC intact, complaining of pain in the vicinity of her laceration.  Unclear last tetanus status.  Past Medical History:  Diagnosis Date   A-fib [facility])    Aortic stenosis    s/p AVR 07/2005(porcine)   Carotid bruit 09/2008   <50% B   GERD (gastroesophageal reflux disease)    Gilbert's syndrome    Hyperlipidemia    Hypertension    IFG (impaired fasting glucose)    OAB (overactive bladder)    Osteopenia    Phlebitis    UPPER EXTREMITY   PVC's (premature ventricular contractions)    Shingles    Varicose veins    s/p treatment    Vertigo     Patient Active Problem List   Diagnosis Date Noted   Closed right ankle fracture 09/10/2020   CKD (chronic kidney disease), stage II 09/10/2020   Cervical spine fracture ([place]) 06/03/2019   Syncope, vasovagal 09/27/2017   Syncope and collapse 09/26/2017   Fracture dislocation of right shoulder joint 09/24/2017   Shoulder fracture, right 09/24/2017   Chronic diastolic CHF (congestive heart failure) ([place]) 09/24/2017   Leucocytosis 09/24/2017   Cellulitis 05/05/2015   S/P AVR 10/26/2010   Aortic  stenosis    Hypertension    PVC's (premature ventricular contractions)    Hyperlipidemia     Past Surgical History:  Procedure Laterality Date   ABDOMINAL HYSTERECTOMY  1963   partial   AORTIC VALVE REPLACEMENT  08/01/2005   WITH A #23MM [place]OR[place]TO STENTLESS PORCINE AORTIC VALVE   CARDIAC CATHETERIZATION  07/25/2005   EF 60% THE AORTIC VALVE IS HEAVILY CALCIFIED WITH REDUCED OPENING. NO MVP   FOOT SURGERY     HAND SURGERY     HEMORRHOID SURGERY  1980's   ORIF ANKLE FRACTURE Right 09/11/2020   Procedure: OPEN REDUCTION INTERNAL FIXATION (ORIF) ANKLE FRACTURE;  Surgeon: Hiram Gash, MD;  Location: [facility];  Service: Orthopedics;  Laterality: Right;   US ECHOCARDIOGRAPHY  04/14/2008   EF 55-60%. NORMAL   US ECHOCARDIOGRAPHY  11/21/2005   EF 55-60%   US ECHOCARDIOGRAPHY  07/20/2005   EF 55-60%   US ECHOCARDIOGRAPHY  10/19/2004   EF 55-60%   VARICOSE VEIN SURGERY  2010   laser treatment     OB History   No obstetric history on file.     Family History  Problem Relation Age of Onset   Pneumonia Mother 36   Alzheimer's disease Father    Leukemia Brother    Alcohol abuse Brother     Social History   Tobacco Use  Smoking status: Never   Smokeless tobacco: Never  Vaping Use   Vaping Use: Never used  Substance Use Topics   Alcohol use: No   Drug use: No    Home Medications Prior to Admission medications   Medication Sig Start Date End Date Taking? Authorizing Provider  amLODipine (NORVASC) 10 MG tablet Take 10 mg by mouth daily. 03/19/19   [provider]  aspirin 81 MG tablet Take 81 mg by mouth daily.      [provider]  bisacodyl (DULCOLAX) 10 MG suppository Place 1 suppository (10 mg total) rectally daily as needed for moderate constipation. 09/16/20   Lavina Hamman, MD  docusate sodium (COLACE) 100 MG capsule Take 1 capsule (100 mg total) by mouth 2 (two) times daily as needed for mild constipation. 09/16/20 09/16/21  Lavina Hamman, MD  donepezil  (ARICEPT) 10 MG tablet Take 10 mg by mouth at bedtime.  01/11/15   [provider]  enoxaparin (LOVENOX) 40 MG/0.4ML injection Inject 0.4 mLs (40 mg total) into the skin daily. 09/14/20 10/14/20  Ethelda Chick, PA-C  hydrochlorothiazide (HYDRODIURIL) 25 MG tablet Take 25 mg by mouth daily. 03/19/19   [provider]  HYDROcodone-acetaminophen (NORCO) 5-325 MG tablet Take 1-2 tablets by mouth every 6 (six) hours as needed for severe pain. Patient not taking: Reported on 12/08/2020 09/14/20   Ethelda Chick, PA-C  methocarbamol (ROBAXIN) 500 MG tablet Take 1 tablet by mouth as needed. For spasms/muscle tension 05/12/19   [provider]  olmesartan (BENICAR) 40 MG tablet Take 40 mg by mouth daily.    [provider]  psyllium (HYDROCIL/METAMUCIL) 95 % PACK Take 1 packet by mouth daily. 09/16/20   Lavina Hamman, MD  Vitamin D, Ergocalciferol, (DRISDOL) 50000 units CAPS capsule Take 50,000 Units by mouth every 7 (seven) days. On Sundays    [provider]    Allergies    Synthroid [levothyroxine sodium]  Review of Systems   Review of Systems  Skin:  Positive for wound.  All other systems reviewed and are negative.  Physical Exam Updated Vital Signs BP (!) 183/64    Pulse 72    Temp 98.2 F (36.8 C) (Oral)    Resp 17    Ht 5\' 4"  (1.626 m)    Wt 72.6 kg    SpO2 100%    BMI 27.46 kg/m   Physical Exam Vitals and nursing note reviewed.  Constitutional:      General: She is not in acute distress.    Appearance: She is well-developed.     Comments: GCS 15, ABC intact  HENT:     Head: Normocephalic.     Comments: 1 cm laceration to the vertex of the scalp, hemostatic Eyes:     Extraocular Movements: Extraocular movements intact.     Conjunctiva/sclera: Conjunctivae normal.     Pupils: Pupils are equal, round, and reactive to light.  Neck:     Comments: No midline tenderness to palpation of the cervical spine.  Range of motion  intact Cardiovascular:     Rate and Rhythm: Normal rate and regular rhythm.     Heart sounds: No murmur heard. Pulmonary:     Effort: Pulmonary effort is normal. No respiratory distress.     Breath sounds: Normal breath sounds.  Chest:     Comments: Clavicles stable nontender to AP compression.  Chest wall stable and nontender to AP and lateral compression. Abdominal:     Palpations: Abdomen  is soft.     Tenderness: There is no abdominal tenderness.  Musculoskeletal:     Cervical back: Neck supple.     Comments: No midline tenderness to palpation of the thoracic or lumbar spine.  Extremities atraumatic with intact range of motion  Skin:    General: Skin is warm and dry.  Neurological:     Mental Status: She is alert.     Comments: Cranial nerves II through XII grossly intact.  Moving all 4 extremities spontaneously.  Sensation grossly intact all 4 extremities    ED Results / Procedures / Treatments   Labs (all labs ordered are listed, but only abnormal results are displayed) Labs Reviewed - No data to display  EKG None  Radiology CT Head Wo Contrast  Result Date: 01/01/2021 CLINICAL DATA:  Trauma EXAM: CT HEAD WITHOUT CONTRAST TECHNIQUE: Contiguous axial images were obtained from the base of the skull through the vertex without intravenous contrast. COMPARISON:  09/10/2020 FINDINGS: Brain: No acute intracranial abnormality is seen. There are no signs of bleeding. Cortical sulci are prominent. Vascular: Unremarkable. Skull: No displaced fracture is seen. Sinuses/Orbits: There is mild mucosal thickening in the ethmoid and sphenoid sinuses. Other: No significant interval changes are noted. IMPRESSION: No acute intracranial findings are seen in noncontrast CT brain. Atrophy. Chronic sinusitis. Electronically Signed   By: Elmer Picker M.D.   On: 01/01/2021 12:37   CT Cervical Spine Wo Contrast  Result Date: 01/01/2021 CLINICAL DATA:  Trauma EXAM: CT CERVICAL SPINE WITHOUT  CONTRAST TECHNIQUE: Multidetector CT imaging of the cervical spine was performed without intravenous contrast. Multiplanar CT image reconstructions were also generated. COMPARISON:  06/03/2019 FINDINGS: Alignment: There is 3 mm anterolisthesis at C4-C5 level which has not changed significantly. Skull base and vertebrae: No recent fracture is seen. There is interval healing of fractures of right facet at C4 and C5 levels Soft tissues and spinal canal: Overall shape of spinal canal is unremarkable. Posterior bony spurs are causing extrinsic pressure over the ventral margin of thecal sac at C5-C6 level, more so on the right side with spinal stenosis. There is minimal extrinsic pressure over the ventral margin of thecal sac at C6-C7 level. Disc levels: There is encroachment of neural foramina from C3-C7 levels. Upper chest: There are small linear densities in both apices, possibly suggesting scarring. Other: There is inhomogeneous attenuation in the thyroid with multiple low-density nodules. Similar finding was seen in the previous CT done on 06/03/2019. IMPRESSION: No recent fracture or dislocation is seen in the cervical spine. There is interval healing of fractures of right facet at C4 and C5 levels. There is 3 mm anterolisthesis at C4-C5 level which has not changed significantly. Cervical spondylosis with spinal stenosis at C5-C6 level and encroachment of neural foramina at multiple levels. There is inhomogeneous attenuation in thyroid with multiple nodules. Electronically Signed   By: Elmer Picker M.D.   On: 01/01/2021 12:46    Procedures .Marland KitchenLaceration Repair  Date/Time: 01/01/2021 1:03 PM Performed by: Regan Lemming, MD Authorized by: Regan Lemming, MD   Consent:    Consent obtained:  Verbal   Consent given by:  Patient   Risks discussed:  Infection and pain Universal protocol:    Patient identity confirmed:  Verbally with patient Anesthesia:    Anesthesia method:  None Laceration details:     Location:  Scalp   Length (cm):  1   Depth (mm):  2 Treatment:    Area cleansed with:  Soap and water and saline  Amount of cleaning:  Standard   Irrigation solution:  Sterile saline Skin repair:    Repair method:  Staples   Number of staples:  3 Approximation:    Approximation:  Close Repair type:    Repair type:  Simple Post-procedure details:    Dressing:  Open (no dressing)   Procedure completion:  Tolerated   Medications Ordered in ED Medications  lidocaine-EPINEPHrine (XYLOCAINE W/EPI) 2 %-1:200000 (PF) injection 20 mL (has no administration in time range)  Tdap (BOOSTRIX) injection 0.5 mL (0.5 mLs Intramuscular Given 01/01/21 1300)    ED Course  I have reviewed the triage vital signs and the nursing notes.  Pertinent labs & imaging results that were available during my care of the patient were reviewed by me and considered in my medical decision making (see chart for details).    MDM Rules/Calculators/A&P                          87 year old female with a history of atrial fibrillation and aortic stenosis status post AVR in 2007, GERD, HLD, HTN not on anticoagulation who presents to the emergency department after a mechanical fall.  The patient presents as a nonlevel trauma.  She states that earlier today she bent over to pick up a poinsettia leaf and fell backwards.  She struck the top of her head on a coffee table and sustained a gash to her scalp.  No loss of consciousness.  She denies any neurologic deficits, changes in vision.  She denies any other pain or complaints.  She denies any neck pain.  She arrived to the [facility] emergency department GCS 15, ABC intact, complaining of pain in the vicinity of her laceration.  Unclear last tetanus status.  Given the patient's age and head trauma, will obtain CT of the head and cervical spine.  Patient's tetanus is updated in the emergency department.  Her wound was cleaned bedside by nursing staff.  CT imaging of the head  and cervical spine was negative for acute intracranial malady or acute fracture of the cervical spine.  CT of the cervical spine did show old healing fractures of the cervical spine.  Nothing new or acute.  Patient is overall well-appearing.  No other injuries identified on physical exam.  The patient's laceration of her scalp was closed with 3 staples as per the procedure note above.  Her tetanus was updated.  Overall at this time stable for discharge.  Wound care instructions provided and the patient was advised to follow-up with her PCP, urgent care or the emergency department for staple removal in 10 days.   Final Clinical Impression(s) / ED Diagnoses Final diagnoses:  Fall, initial encounter  Laceration of scalp, initial encounter    Rx / DC Orders ED Discharge Orders     None        Regan Lemming, MD 01/01/21 1305

## 2021-01-01 NOTE — ED Notes (Signed)
Patient verbalizes understanding of discharge instructions. Opportunity for questioning and answers were provided. Patient discharged from ED.  °

## 2021-01-03 DIAGNOSIS — I130 Hypertensive heart and chronic kidney disease with heart failure and stage 1 through stage 4 chronic kidney disease, or unspecified chronic kidney disease: Secondary | ICD-10-CM | POA: Diagnosis not present

## 2021-01-03 DIAGNOSIS — N182 Chronic kidney disease, stage 2 (mild): Secondary | ICD-10-CM | POA: Diagnosis not present

## 2021-01-03 DIAGNOSIS — S82891D Other fracture of right lower leg, subsequent encounter for closed fracture with routine healing: Secondary | ICD-10-CM | POA: Diagnosis not present

## 2021-01-03 DIAGNOSIS — Z952 Presence of prosthetic heart valve: Secondary | ICD-10-CM | POA: Diagnosis not present

## 2021-01-03 DIAGNOSIS — I5032 Chronic diastolic (congestive) heart failure: Secondary | ICD-10-CM | POA: Diagnosis not present

## 2021-01-03 DIAGNOSIS — E785 Hyperlipidemia, unspecified: Secondary | ICD-10-CM | POA: Diagnosis not present

## 2021-01-10 DIAGNOSIS — I5032 Chronic diastolic (congestive) heart failure: Secondary | ICD-10-CM | POA: Diagnosis not present

## 2021-01-10 DIAGNOSIS — Z952 Presence of prosthetic heart valve: Secondary | ICD-10-CM | POA: Diagnosis not present

## 2021-01-10 DIAGNOSIS — N182 Chronic kidney disease, stage 2 (mild): Secondary | ICD-10-CM | POA: Diagnosis not present

## 2021-01-10 DIAGNOSIS — S82891D Other fracture of right lower leg, subsequent encounter for closed fracture with routine healing: Secondary | ICD-10-CM | POA: Diagnosis not present

## 2021-01-10 DIAGNOSIS — I130 Hypertensive heart and chronic kidney disease with heart failure and stage 1 through stage 4 chronic kidney disease, or unspecified chronic kidney disease: Secondary | ICD-10-CM | POA: Diagnosis not present

## 2021-01-10 DIAGNOSIS — E785 Hyperlipidemia, unspecified: Secondary | ICD-10-CM | POA: Diagnosis not present

## 2021-01-11 DIAGNOSIS — S0101XA Laceration without foreign body of scalp, initial encounter: Secondary | ICD-10-CM | POA: Diagnosis not present

## 2021-01-11 DIAGNOSIS — I129 Hypertensive chronic kidney disease with stage 1 through stage 4 chronic kidney disease, or unspecified chronic kidney disease: Secondary | ICD-10-CM | POA: Diagnosis not present

## 2021-01-11 DIAGNOSIS — Z4802 Encounter for removal of sutures: Secondary | ICD-10-CM | POA: Diagnosis not present

## 2021-01-11 DIAGNOSIS — W19XXXA Unspecified fall, initial encounter: Secondary | ICD-10-CM | POA: Diagnosis not present

## 2021-01-20 DIAGNOSIS — S82891D Other fracture of right lower leg, subsequent encounter for closed fracture with routine healing: Secondary | ICD-10-CM | POA: Diagnosis not present

## 2021-01-20 DIAGNOSIS — I5032 Chronic diastolic (congestive) heart failure: Secondary | ICD-10-CM | POA: Diagnosis not present

## 2021-01-20 DIAGNOSIS — N182 Chronic kidney disease, stage 2 (mild): Secondary | ICD-10-CM | POA: Diagnosis not present

## 2021-01-20 DIAGNOSIS — Z952 Presence of prosthetic heart valve: Secondary | ICD-10-CM | POA: Diagnosis not present

## 2021-01-20 DIAGNOSIS — E785 Hyperlipidemia, unspecified: Secondary | ICD-10-CM | POA: Diagnosis not present

## 2021-01-20 DIAGNOSIS — I130 Hypertensive heart and chronic kidney disease with heart failure and stage 1 through stage 4 chronic kidney disease, or unspecified chronic kidney disease: Secondary | ICD-10-CM | POA: Diagnosis not present

## 2021-01-25 DIAGNOSIS — I130 Hypertensive heart and chronic kidney disease with heart failure and stage 1 through stage 4 chronic kidney disease, or unspecified chronic kidney disease: Secondary | ICD-10-CM | POA: Diagnosis not present

## 2021-01-25 DIAGNOSIS — N182 Chronic kidney disease, stage 2 (mild): Secondary | ICD-10-CM | POA: Diagnosis not present

## 2021-01-25 DIAGNOSIS — Z952 Presence of prosthetic heart valve: Secondary | ICD-10-CM | POA: Diagnosis not present

## 2021-01-25 DIAGNOSIS — Z9181 History of falling: Secondary | ICD-10-CM | POA: Diagnosis not present

## 2021-01-25 DIAGNOSIS — S82891D Other fracture of right lower leg, subsequent encounter for closed fracture with routine healing: Secondary | ICD-10-CM | POA: Diagnosis not present

## 2021-01-25 DIAGNOSIS — E785 Hyperlipidemia, unspecified: Secondary | ICD-10-CM | POA: Diagnosis not present

## 2021-01-25 DIAGNOSIS — Z7982 Long term (current) use of aspirin: Secondary | ICD-10-CM | POA: Diagnosis not present

## 2021-01-25 DIAGNOSIS — I5032 Chronic diastolic (congestive) heart failure: Secondary | ICD-10-CM | POA: Diagnosis not present

## 2021-01-25 DIAGNOSIS — Z79810 Long term (current) use of selective estrogen receptor modulators (SERMs): Secondary | ICD-10-CM | POA: Diagnosis not present

## 2021-01-26 DIAGNOSIS — I130 Hypertensive heart and chronic kidney disease with heart failure and stage 1 through stage 4 chronic kidney disease, or unspecified chronic kidney disease: Secondary | ICD-10-CM | POA: Diagnosis not present

## 2021-01-26 DIAGNOSIS — S82891D Other fracture of right lower leg, subsequent encounter for closed fracture with routine healing: Secondary | ICD-10-CM | POA: Diagnosis not present

## 2021-01-26 DIAGNOSIS — Z952 Presence of prosthetic heart valve: Secondary | ICD-10-CM | POA: Diagnosis not present

## 2021-01-26 DIAGNOSIS — N182 Chronic kidney disease, stage 2 (mild): Secondary | ICD-10-CM | POA: Diagnosis not present

## 2021-01-26 DIAGNOSIS — I5032 Chronic diastolic (congestive) heart failure: Secondary | ICD-10-CM | POA: Diagnosis not present

## 2021-01-26 DIAGNOSIS — E785 Hyperlipidemia, unspecified: Secondary | ICD-10-CM | POA: Diagnosis not present

## 2021-01-27 ENCOUNTER — Other Ambulatory Visit: Payer: Self-pay | Admitting: *Deleted

## 2021-01-27 NOTE — Patient Outreach (Signed)
[facility]) Care Management  [place]  01/27/2021   QUANNA WITTKE [birth year] [id number]   Telephone Assessment-Successful  RN spoke with pt today and received an update on her ongoing management of care. Pt reports the following documented with the plan of care. No acute issues or needs at this time as pt continue to adhere to the plan.  Will follow up next month with ongoing care management services related to pt's Heart Failure.  Encounter Medications:  Outpatient Encounter Medications as of 01/27/2021  Medication Sig   amLODipine (NORVASC) 10 MG tablet Take 10 mg by mouth daily.   aspirin 81 MG tablet Take 81 mg by mouth daily.     bisacodyl (DULCOLAX) 10 MG suppository Place 1 suppository (10 mg total) rectally daily as needed for moderate constipation.   docusate sodium (COLACE) 100 MG capsule Take 1 capsule (100 mg total) by mouth 2 (two) times daily as needed for mild constipation.   donepezil (ARICEPT) 10 MG tablet Take 10 mg by mouth at bedtime.    enoxaparin (LOVENOX) 40 MG/0.4ML injection Inject 0.4 mLs (40 mg total) into the skin daily.   hydrochlorothiazide (HYDRODIURIL) 25 MG tablet Take 25 mg by mouth daily.   HYDROcodone-acetaminophen (NORCO) 5-325 MG tablet Take 1-2 tablets by mouth every 6 (six) hours as needed for severe pain. (Patient not taking: Reported on 12/08/2020)   methocarbamol (ROBAXIN) 500 MG tablet Take 1 tablet by mouth as needed. For spasms/muscle tension   olmesartan (BENICAR) 40 MG tablet Take 40 mg by mouth daily.   psyllium (HYDROCIL/METAMUCIL) 95 % PACK Take 1 packet by mouth daily.   Vitamin D, Ergocalciferol, (DRISDOL) 50000 units CAPS capsule Take 50,000 Units by mouth every 7 (seven) days. On Sundays   No facility-administered encounter medications on file as of 01/27/2021.    Functional Status:  In your present state of health, do you have any difficulty performing the following activities: 12/08/2020 09/11/2020   Hearing? N Y  Vision? N N  Difficulty concentrating or making decisions? N N  Walking or climbing stairs? Y N  Comment uses walker -  Dressing or bathing? N N  Doing errands, shopping? Y N  Comment assistance with this task -  Preparing Food and eating ? N -  Using the Toilet? N -  In the past six months, have you accidently leaked urine? N -  Do you have problems with loss of bowel control? N -  Managing your Medications? Y -  Comment assistance with this task -  Managing your Finances? N -  Housekeeping or managing your Housekeeping? N -  Some recent data might be hidden    Fall/Depression Screening: Fall Risk  12/08/2020 06/30/2019  Falls in the past year? 0 1  Number falls in past yr: - 1  Injury with Fall? - 1  Risk for fall due to : History of fall(s) History of fall(s);Impaired balance/gait;Impaired mobility  Follow up Falls prevention discussed;Education provided Education provided;Falls prevention discussed   PHQ 2/9 Scores 12/08/2020 06/30/2019  PHQ - 2 Score 0 0    Assessment:   Care Plan Care Plan : RN Care Manager  Updates made by Tobi Bastos, RN since 01/27/2021 12:00 AM     Problem: Knowledge Deficit related to CHF and care coordination needs   Priority: High     Long-Range Goal: Development plan of care for Management of CHF   Start Date: 12/08/2020  Expected End Date: 03/08/2021  This Visit's  Progress: On track  Recent Progress: On track  Priority: High  Note:   Current Barriers:  Knowledge Deficits related to plan of care for management of CHF   RNCM Clinical Goal(s):  Patient will verbalize understanding of plan for management of CHF as evidenced by verbalizing an understanding of the plan discussed today. take all medications exactly as prescribed and will call provider for medication related questions as evidenced by reviewed medication today with no changes attend all scheduled medical appointments: as reviewed today as evidenced by  confirming sufficient transportation from her primary caregiver daughter [name])  Interventions: Inter-disciplinary care team collaboration (see longitudinal plan of care) Evaluation of current treatment plan related to  self management and patient's adherence to plan as established by provider   Heart Failure Interventions:  (Status:  Goal on track:  Yes.) Long Term Goal Provided education on low sodium diet Advised patient to weigh each morning after emptying bladder Discussed importance of daily weight and advised patient to weigh and record daily Discussed the importance of keeping all appointments with provider Provided patient with education about the role of exercise in the management of heart failure Update-12/22: Pt continue to do well with no acute needs at this time. Will continue to encourage ongoing adherence to the reviewed and discussed plan of care. Noted updates presented in the plan of care. Weights reported 158-159 lbs with no acute symptoms or swelling reported.  Update 01/27/2021: Pt continues to report she is doing well with no residual retention of fluid. States she is taking all her medications, has sufficient transportation and reports her ongoing daily weights at baseline 158-159 lbs with no reported issues or symptoms.Pt continues to participate with HHPT weekly and continues to improve with no reported falls or issues. No new needs as RN iterated on the plan of care and will continue to encouraged adherence to the discussed plan. Verified ongoing support system and encouraged pt to communicate with her daughter when needed.  Patient Goals/Self-Care Activities: Take all medications as prescribed Attend all scheduled provider appointments Call pharmacy for medication refills 3-7 days in advance of running out of medications Perform IADL's (shopping, preparing meals, housekeeping, managing finances) independently Call provider office for new concerns or questions    Follow Up Plan:  Telephone follow up appointment with care management team member scheduled for:  Feb 2023 The patient has been provided with contact information for the care management team and has been advised to call with any health related questions or concerns.       Raina Mina, RN Care Management Coordinator [place] Office [phone]

## 2021-01-27 NOTE — Patient Instructions (Signed)
Visit Information  Thank you for taking time to visit with me today. Please don't hesitate to contact me if I can be of assistance to you before our next scheduled telephone appointment.  Following are the goals we discussed today:  Provided education on low sodium diet Advised patient to weigh each morning after emptying bladder Discussed importance of daily weight and advised patient to weigh and record daily Discussed the importance of keeping all appointments with provider Provided patient with education about the role of exercise in the management of heart failure

## 2021-02-02 DIAGNOSIS — I5032 Chronic diastolic (congestive) heart failure: Secondary | ICD-10-CM | POA: Diagnosis not present

## 2021-02-02 DIAGNOSIS — I130 Hypertensive heart and chronic kidney disease with heart failure and stage 1 through stage 4 chronic kidney disease, or unspecified chronic kidney disease: Secondary | ICD-10-CM | POA: Diagnosis not present

## 2021-02-02 DIAGNOSIS — Z952 Presence of prosthetic heart valve: Secondary | ICD-10-CM | POA: Diagnosis not present

## 2021-02-02 DIAGNOSIS — E785 Hyperlipidemia, unspecified: Secondary | ICD-10-CM | POA: Diagnosis not present

## 2021-02-02 DIAGNOSIS — N182 Chronic kidney disease, stage 2 (mild): Secondary | ICD-10-CM | POA: Diagnosis not present

## 2021-02-02 DIAGNOSIS — S82891D Other fracture of right lower leg, subsequent encounter for closed fracture with routine healing: Secondary | ICD-10-CM | POA: Diagnosis not present

## 2021-02-07 DIAGNOSIS — D225 Melanocytic nevi of trunk: Secondary | ICD-10-CM | POA: Diagnosis not present

## 2021-02-07 DIAGNOSIS — L821 Other seborrheic keratosis: Secondary | ICD-10-CM | POA: Diagnosis not present

## 2021-02-07 DIAGNOSIS — D2271 Melanocytic nevi of right lower limb, including hip: Secondary | ICD-10-CM | POA: Diagnosis not present

## 2021-02-11 DIAGNOSIS — S82891D Other fracture of right lower leg, subsequent encounter for closed fracture with routine healing: Secondary | ICD-10-CM | POA: Diagnosis not present

## 2021-02-11 DIAGNOSIS — N182 Chronic kidney disease, stage 2 (mild): Secondary | ICD-10-CM | POA: Diagnosis not present

## 2021-02-11 DIAGNOSIS — Z952 Presence of prosthetic heart valve: Secondary | ICD-10-CM | POA: Diagnosis not present

## 2021-02-11 DIAGNOSIS — I5032 Chronic diastolic (congestive) heart failure: Secondary | ICD-10-CM | POA: Diagnosis not present

## 2021-02-11 DIAGNOSIS — I130 Hypertensive heart and chronic kidney disease with heart failure and stage 1 through stage 4 chronic kidney disease, or unspecified chronic kidney disease: Secondary | ICD-10-CM | POA: Diagnosis not present

## 2021-02-11 DIAGNOSIS — E785 Hyperlipidemia, unspecified: Secondary | ICD-10-CM | POA: Diagnosis not present

## 2021-02-18 DIAGNOSIS — I5032 Chronic diastolic (congestive) heart failure: Secondary | ICD-10-CM | POA: Diagnosis not present

## 2021-02-18 DIAGNOSIS — S82891D Other fracture of right lower leg, subsequent encounter for closed fracture with routine healing: Secondary | ICD-10-CM | POA: Diagnosis not present

## 2021-02-18 DIAGNOSIS — I130 Hypertensive heart and chronic kidney disease with heart failure and stage 1 through stage 4 chronic kidney disease, or unspecified chronic kidney disease: Secondary | ICD-10-CM | POA: Diagnosis not present

## 2021-02-18 DIAGNOSIS — Z952 Presence of prosthetic heart valve: Secondary | ICD-10-CM | POA: Diagnosis not present

## 2021-02-18 DIAGNOSIS — E785 Hyperlipidemia, unspecified: Secondary | ICD-10-CM | POA: Diagnosis not present

## 2021-02-18 DIAGNOSIS — N182 Chronic kidney disease, stage 2 (mild): Secondary | ICD-10-CM | POA: Diagnosis not present

## 2021-02-23 DIAGNOSIS — I130 Hypertensive heart and chronic kidney disease with heart failure and stage 1 through stage 4 chronic kidney disease, or unspecified chronic kidney disease: Secondary | ICD-10-CM | POA: Diagnosis not present

## 2021-02-23 DIAGNOSIS — S82891D Other fracture of right lower leg, subsequent encounter for closed fracture with routine healing: Secondary | ICD-10-CM | POA: Diagnosis not present

## 2021-02-23 DIAGNOSIS — N182 Chronic kidney disease, stage 2 (mild): Secondary | ICD-10-CM | POA: Diagnosis not present

## 2021-02-23 DIAGNOSIS — E785 Hyperlipidemia, unspecified: Secondary | ICD-10-CM | POA: Diagnosis not present

## 2021-02-23 DIAGNOSIS — I5032 Chronic diastolic (congestive) heart failure: Secondary | ICD-10-CM | POA: Diagnosis not present

## 2021-02-23 DIAGNOSIS — Z952 Presence of prosthetic heart valve: Secondary | ICD-10-CM | POA: Diagnosis not present

## 2021-02-23 NOTE — Progress Notes (Signed)
Cardiology Office Note:    Date:  02/24/2021   ID:  Megan Pitts, DOB September 05, 1927, MRN [id number]  PCP:  Ginger Organ., MD [place] Cardiologist: Peter Martinique, MD   Reason for visit: 4-month follow-up  History of Present Illness:    Megan Pitts is a 88 y.o. female with a hx of severe AS and had AVR with a #48mm [place] stentless porcine valve in July of 2007. Echo in 1/18 and 6/20 showed good valve function with only trivial AI. Other issues include HTN, PVCs, HLD, post op atrial fib, GERD, Gilbert's syndrome, and carotid disease. She has a history of bradycardia when taking beta blockers.   She last saw Dr. Martinique in June 2021.  She denied cardiac symptoms.  Today, she comes in with her daughter.  Her only complaint is feeling a sharp pain in her right arm around 7 and 9:00 pm when she is in the recliner that feels like a nerve pain.  Her daughter wonders if this is from arm positioning or her bending her neck to read.  Otherwise, she walks quarter of a mile every day with no chest pressure, shortness of breath or arm pain.  She denies palpitations, lightheadedness, PND, orthopnea and lower extremity edema.  She is doing physical therapy once a week to work on balance.  She has fallen 2 times in the past year.  One was when she was bending over and the other one was when she miss stepped near a [place].  Both times she denies lightheadedness.  She states states her systolic blood pressure at home is around 130s.  She typically drinks 2 bottles of water a day.     Past Medical History:  Diagnosis Date   A-fib [facility])    Aortic stenosis    s/p AVR 07/2005(porcine)   Carotid bruit 09/2008   <50% B   GERD (gastroesophageal reflux disease)    Gilbert's syndrome    Hyperlipidemia    Hypertension    IFG (impaired fasting glucose)    OAB (overactive bladder)    Osteopenia    Phlebitis    UPPER EXTREMITY   PVC's (premature ventricular contractions)    Shingles    Varicose  veins    s/p treatment    Vertigo     Past Surgical History:  Procedure Laterality Date   ABDOMINAL HYSTERECTOMY  1963   partial   AORTIC VALVE REPLACEMENT  08/01/2005   WITH A #23MM [place] STENTLESS PORCINE AORTIC VALVE   CARDIAC CATHETERIZATION  07/25/2005   EF 60% THE AORTIC VALVE IS HEAVILY CALCIFIED WITH REDUCED OPENING. NO MVP   FOOT SURGERY     HAND SURGERY     HEMORRHOID SURGERY  1980's   ORIF ANKLE FRACTURE Right 09/11/2020   Procedure: OPEN REDUCTION INTERNAL FIXATION (ORIF) ANKLE FRACTURE;  Surgeon: Hiram Gash, MD;  Location: [facility];  Service: Orthopedics;  Laterality: Right;   US ECHOCARDIOGRAPHY  04/14/2008   EF 55-60%. NORMAL   US ECHOCARDIOGRAPHY  11/21/2005   EF 55-60%   US ECHOCARDIOGRAPHY  07/20/2005   EF 55-60%   US ECHOCARDIOGRAPHY  10/19/2004   EF 55-60%   VARICOSE VEIN SURGERY  2010   laser treatment    Current Medications: Current Meds  Medication Sig   amLODipine (NORVASC) 10 MG tablet Take 10 mg by mouth daily.   aspirin 81 MG tablet Take 81 mg by mouth daily.     bisacodyl (DULCOLAX) 10 MG suppository Place  1 suppository (10 mg total) rectally daily as needed for moderate constipation.   docusate sodium (COLACE) 100 MG capsule Take 1 capsule (100 mg total) by mouth 2 (two) times daily as needed for mild constipation.   donepezil (ARICEPT) 10 MG tablet Take 10 mg by mouth at bedtime.    hydrochlorothiazide (HYDRODIURIL) 25 MG tablet Take 25 mg by mouth daily.   methocarbamol (ROBAXIN) 500 MG tablet Take 1 tablet by mouth as needed. For spasms/muscle tension   olmesartan (BENICAR) 40 MG tablet Take 40 mg by mouth daily.   psyllium (HYDROCIL/METAMUCIL) 95 % PACK Take 1 packet by mouth daily.   Vitamin D, Ergocalciferol, (DRISDOL) 50000 units CAPS capsule Take 50,000 Units by mouth every 7 (seven) days. On Sundays     Allergies:   Synthroid [levothyroxine sodium] and Tramadol hcl   Social History   Socioeconomic History   Marital status: Widowed     Spouse name: Not on file   Number of children: 2   Years of education: 17   Highest education level: 12th grade  Occupational History   Occupation: retired  Tobacco Use   Smoking status: Never   Smokeless tobacco: Never  Vaping Use   Vaping Use: Never used  Substance and Sexual Activity   Alcohol use: No   Drug use: No   Sexual activity: Not Currently  Other Topics Concern   Not on file  Social History Narrative   Not on file   Social Determinants of Health   Financial Resource Strain: Not on file  Food Insecurity: No Food Insecurity   Worried About Charity fundraiser in the Last Year: Never true   [place] in the Last Year: Never true  Transportation Needs: No Transportation Needs   Lack of Transportation (Medical): No   Lack of Transportation (Non-Medical): No  Physical Activity: Not on file  Stress: Not on file  Social Connections: Not on file     Family History: The patient's family history includes Alcohol abuse in her brother; Alzheimer's disease in her father; Leukemia in her brother; Pneumonia (age of onset: 24) in her mother.  ROS:   Please see the history of present illness.     EKGs/Labs/Other Studies Reviewed:    EKG:  The ekg ordered today demonstrates normal sinus rhythm with sinus arrhythmia, heart rate 63, PR interval 182 ms, QRS duration 92 ms.  Recent Labs: 09/12/2020: BUN 14; Creatinine, Ser 0.97; Hemoglobin 14.0; Platelets 311; Potassium 3.8; Sodium 136   Recent Lipid Panel No results found for: CHOL, TRIG, HDL, LDLCALC, LDLDIRECT  Physical Exam:    VS:  BP (!) 120/50 (BP Location: Left Arm, Patient Position: Sitting)    Pulse 63    Ht 5\' 2"  (1.575 m)    Wt 161 lb 3.2 oz (73.1 kg)    SpO2 93%    BMI 29.48 kg/m    No data found.  Wt Readings from Last 3 Encounters:  02/24/21 161 lb 3.2 oz (73.1 kg)  01/01/21 160 lb (72.6 kg)  09/10/20 160 lb (72.6 kg)     GEN:  Well nourished, well developed in no acute distress HEENT:  Normal NECK: No JVD CARDIAC: RRR, systolic murmur radiating to the carotids. RESPIRATORY:  Clear to auscultation without rales, wheezing or rhonchi  ABDOMEN: Soft, non-tender, non-distended MUSCULOSKELETAL: No edema; No deformity  SKIN: Warm and dry NEUROLOGIC:  Alert and oriented PSYCHIATRIC:  Normal affect     ASSESSMENT AND PLAN   History  of aortic stenosis status post AVR with tissue prosthesis in 2007 -Stable echo in 2020 -Denies angina, dyspnea, heart failure symptoms and syncope.  Hypertension, well controlled -Continue current medications. -Goal SBP is 120-140.    Hyperlipidemia -LDL 169 in July 2022.  No longer term benefit from statin given her age.  Fragility -Recommend continue working with physical therapy.  Continue regular walking.  Recommend increasing water intake, particularly since she is taking HCTZ.  Disposition - Follow-up in 1 year with Dr. Martinique.        Medication Adjustments/Labs and Tests Ordered: Current medicines are reviewed at length with the patient today.  Concerns regarding medicines are outlined above.  Orders Placed This Encounter  Procedures   EKG 12-Lead   No orders of the defined types were placed in this encounter.   Patient Instructions  Medication Instructions:  No Changes  *If you need a refill on your cardiac medications before your next appointment, please call your pharmacy*   Lab Work: No Labs If you have labs (blood work) drawn today and your tests are completely normal, you will receive your results only by: [place] (if you have MyChart) OR A paper copy in the mail If you have any lab test that is abnormal or we need to change your treatment, we will call you to review the results.   Testing/Procedures: No Testing    Follow-Up: At [facility], you and your health needs are our priority.  As part of our continuing mission to provide you with exceptional heart care, we have created designated  Provider Care Teams.  These Care Teams include your primary Cardiologist (physician) and Advanced Practice Providers (APPs -  Physician Assistants and Nurse Practitioners) who all work together to provide you with the care you need, when you need it.  We recommend signing up for the patient portal called "MyChart".  Sign up information is provided on this After Visit Summary.  MyChart is used to connect with patients for Virtual Visits (Telemedicine).  Patients are able to view lab/test results, encounter notes, upcoming appointments, etc.  Non-urgent messages can be sent to your provider as well.   To learn more about what you can do with MyChart, go to [email or web address].    Your next appointment:   1 year(s)  The format for your next appointment:   In Person  Provider:   Peter Martinique, MD         Signed, Warren Lacy, PA-C  02/24/2021 11:02 AM    [place]

## 2021-02-24 ENCOUNTER — Ambulatory Visit (INDEPENDENT_AMBULATORY_CARE_PROVIDER_SITE_OTHER): Payer: Medicare Other | Admitting: Physician Assistant

## 2021-02-24 ENCOUNTER — Other Ambulatory Visit: Payer: Self-pay

## 2021-02-24 VITALS — BP 120/50 | HR 63 | Ht 62.0 in | Wt 161.2 lb

## 2021-02-24 DIAGNOSIS — Z952 Presence of prosthetic heart valve: Secondary | ICD-10-CM | POA: Diagnosis not present

## 2021-02-24 DIAGNOSIS — I130 Hypertensive heart and chronic kidney disease with heart failure and stage 1 through stage 4 chronic kidney disease, or unspecified chronic kidney disease: Secondary | ICD-10-CM | POA: Diagnosis not present

## 2021-02-24 DIAGNOSIS — N182 Chronic kidney disease, stage 2 (mild): Secondary | ICD-10-CM | POA: Diagnosis not present

## 2021-02-24 DIAGNOSIS — I10 Essential (primary) hypertension: Secondary | ICD-10-CM

## 2021-02-24 DIAGNOSIS — Z79810 Long term (current) use of selective estrogen receptor modulators (SERMs): Secondary | ICD-10-CM | POA: Diagnosis not present

## 2021-02-24 DIAGNOSIS — E785 Hyperlipidemia, unspecified: Secondary | ICD-10-CM | POA: Diagnosis not present

## 2021-02-24 DIAGNOSIS — I5032 Chronic diastolic (congestive) heart failure: Secondary | ICD-10-CM | POA: Diagnosis not present

## 2021-02-24 DIAGNOSIS — Z7982 Long term (current) use of aspirin: Secondary | ICD-10-CM | POA: Diagnosis not present

## 2021-02-24 DIAGNOSIS — Z9181 History of falling: Secondary | ICD-10-CM | POA: Diagnosis not present

## 2021-02-24 DIAGNOSIS — S82891D Other fracture of right lower leg, subsequent encounter for closed fracture with routine healing: Secondary | ICD-10-CM | POA: Diagnosis not present

## 2021-02-24 NOTE — Patient Instructions (Signed)
Medication Instructions:  No Changes  *If you need a refill on your cardiac medications before your next appointment, please call your pharmacy*   Lab Work: No Labs If you have labs (blood work) drawn today and your tests are completely normal, you will receive your results only by: [place] (if you have MyChart) OR A paper copy in the mail If you have any lab test that is abnormal or we need to change your treatment, we will call you to review the results.   Testing/Procedures: No Testing    Follow-Up: At [facility], you and your health needs are our priority.  As part of our continuing mission to provide you with exceptional heart care, we have created designated Provider Care Teams.  These Care Teams include your primary Cardiologist (physician) and Advanced Practice Providers (APPs -  Physician Assistants and Nurse Practitioners) who all work together to provide you with the care you need, when you need it.  We recommend signing up for the patient portal called "MyChart".  Sign up information is provided on this After Visit Summary.  MyChart is used to connect with patients for Virtual Visits (Telemedicine).  Patients are able to view lab/test results, encounter notes, upcoming appointments, etc.  Non-urgent messages can be sent to your provider as well.   To learn more about what you can do with MyChart, go to [email or web address].    Your next appointment:   1 year(s)  The format for your next appointment:   In Person  Provider:   Peter Martinique, MD

## 2021-03-01 DIAGNOSIS — Z952 Presence of prosthetic heart valve: Secondary | ICD-10-CM | POA: Diagnosis not present

## 2021-03-01 DIAGNOSIS — S82891D Other fracture of right lower leg, subsequent encounter for closed fracture with routine healing: Secondary | ICD-10-CM | POA: Diagnosis not present

## 2021-03-01 DIAGNOSIS — I130 Hypertensive heart and chronic kidney disease with heart failure and stage 1 through stage 4 chronic kidney disease, or unspecified chronic kidney disease: Secondary | ICD-10-CM | POA: Diagnosis not present

## 2021-03-01 DIAGNOSIS — I5032 Chronic diastolic (congestive) heart failure: Secondary | ICD-10-CM | POA: Diagnosis not present

## 2021-03-01 DIAGNOSIS — E785 Hyperlipidemia, unspecified: Secondary | ICD-10-CM | POA: Diagnosis not present

## 2021-03-01 DIAGNOSIS — N182 Chronic kidney disease, stage 2 (mild): Secondary | ICD-10-CM | POA: Diagnosis not present

## 2021-03-07 DIAGNOSIS — S82891D Other fracture of right lower leg, subsequent encounter for closed fracture with routine healing: Secondary | ICD-10-CM | POA: Diagnosis not present

## 2021-03-07 DIAGNOSIS — Z952 Presence of prosthetic heart valve: Secondary | ICD-10-CM | POA: Diagnosis not present

## 2021-03-07 DIAGNOSIS — E785 Hyperlipidemia, unspecified: Secondary | ICD-10-CM | POA: Diagnosis not present

## 2021-03-07 DIAGNOSIS — N182 Chronic kidney disease, stage 2 (mild): Secondary | ICD-10-CM | POA: Diagnosis not present

## 2021-03-07 DIAGNOSIS — I5032 Chronic diastolic (congestive) heart failure: Secondary | ICD-10-CM | POA: Diagnosis not present

## 2021-03-07 DIAGNOSIS — I130 Hypertensive heart and chronic kidney disease with heart failure and stage 1 through stage 4 chronic kidney disease, or unspecified chronic kidney disease: Secondary | ICD-10-CM | POA: Diagnosis not present

## 2021-03-08 ENCOUNTER — Other Ambulatory Visit: Payer: Self-pay | Admitting: *Deleted

## 2021-03-08 NOTE — Patient Outreach (Signed)
[facility]) Care Management Telephonic RN Care Manager Note   03/08/2021 Name:  MISHEEL GOWANS MRN:  [id number] DOB:  09-Mar-1927  Summary: Reports she remains in the GREEN zone with no related issue or symptoms with weights at baseline. No other needs presented at this time.  Recommendations/Changes made from today's visit: Continues to improve with her ongoing management of care independently with the provider education and tools. Will continue to encourage adherence.  Subjective: JENNETTE LEASK is an 88 y.o. year old female who is a primary patient of Ginger Organ., MD. The care management team was consulted for assistance with care management and/or care coordination needs.    Telephonic RN Care Manager completed Telephone Visit today.  Objective:   Medications Reviewed Today     Reviewed by Tobi Bastos, RN (Registered Nurse) on 12/08/20 at 1815  Med List Status: <None>   Medication Order Taking? Sig Documenting Provider Last Dose Status Informant  amLODipine (NORVASC) 10 MG tablet [id number] Yes Take 10 mg by mouth daily. [provider] Taking Active Family Member  aspirin 81 MG tablet [id number] Yes Take 81 mg by mouth daily.   [provider] Taking Active Family Member  bisacodyl (DULCOLAX) 10 MG suppository [id number] Yes Place 1 suppository (10 mg total) rectally daily as needed for moderate constipation. Lavina Hamman, MD Taking Active   docusate sodium (COLACE) 100 MG capsule [id number] Yes Take 1 capsule (100 mg total) by mouth 2 (two) times daily as needed for mild constipation. Lavina Hamman, MD Taking Active   donepezil (ARICEPT) 10 MG tablet [id number] Yes Take 10 mg by mouth at bedtime.  [provider] Taking Active Family Member           Med Note Brett Albino, Wet[place] May 05, 2015  4:08 PM)    enoxaparin (LOVENOX) 40 MG/0.4ML injection [id number]  Inject 0.4 mLs (40 mg total) into the skin daily. Ethelda Chick, [place]  Expired 10/14/20 2359   hydrochlorothiazide (HYDRODIURIL) 25 MG tablet [id number] Yes Take 25 mg by mouth daily. [provider] Taking Active Family Member  HYDROcodone-acetaminophen (NORCO) 5-325 MG tablet [id number] No Take 1-2 tablets by mouth every 6 (six) hours as needed for severe pain.  Patient not taking: Reported on 12/08/2020   Ethelda Chick, PA-C Not Taking Active   methocarbamol (ROBAXIN) 500 MG tablet [id number] Yes Take 1 tablet by mouth as needed. For spasms/muscle tension [provider] Taking Active Family Member  olmesartan (BENICAR) 40 MG tablet [id number] Yes Take 40 mg by mouth daily. [provider] Taking Active Family Member  psyllium (HYDROCIL/METAMUCIL) 95 % PACK [id number] Yes Take 1 packet by mouth daily. Lavina Hamman, MD Taking Active   Vitamin D, Ergocalciferol, (DRISDOL) 50000 units CAPS capsule [id number] Yes Take 50,000 Units by mouth every 7 (seven) days. On Sundays [provider] Taking Active Family Member             SDOH:  (Social Determinants of Health) assessments and interventions performed:     Care Plan  Review of patient past medical history, allergies, medications, health status, including review of consultants reports, laboratory and other test data, was performed as part of comprehensive evaluation for care management services.   Care Plan : RN Care Manager  Updates made by Tobi Bastos, RN since 03/08/2021 12:00 AM     Problem: Knowledge Deficit related to CHF and care coordination needs  Priority: High     Long-Range Goal: Development plan of care for Management of CHF   Start Date: 12/08/2020  Expected End Date: 08/08/2021  This Visit's Progress: On track  Recent Progress: On track  Priority: High  Note:   Current Barriers:  Knowledge Deficits related to plan of care for management of CHF   RNCM Clinical Goal(s):  Patient will verbalize understanding of plan for  management of CHF as evidenced by verbalizing an understanding of the plan discussed today. take all medications exactly as prescribed and will call provider for medication related questions as evidenced by reviewed medication today with no changes attend all scheduled medical appointments: as reviewed today as evidenced by confirming sufficient transportation from her primary caregiver daughter [name])  Interventions: Inter-disciplinary care team collaboration (see longitudinal plan of care) Evaluation of current treatment plan related to  self management and patient's adherence to plan as established by provider   Heart Failure Interventions:  (Status:  Goal on track:  Yes.) Long Term Goal Provided education on low sodium diet Advised patient to weigh each morning after emptying bladder Discussed importance of daily weight and advised patient to weigh and record daily Discussed the importance of keeping all appointments with provider Provided patient with education about the role of exercise in the management of heart failure Update-12/22: Pt continue to do well with no acute needs at this time. Will continue to encourage ongoing adherence to the reviewed and discussed plan of care. Noted updates presented in the plan of care. Weights reported 158-159 lbs with no acute symptoms or swelling reported.  Update 01/27/2021: Pt continues to report she is doing well with no residual retention of fluid. States she is taking all her medications, has sufficient transportation and reports her ongoing daily weights at baseline 158-159 lbs with no reported issues or symptoms.Pt continues to participate with HHPT weekly and continues to improve with no reported falls or issues. No new needs as RN iterated on the plan of care and will continue to encouraged adherence to the discussed plan. Verified ongoing support system and encouraged pt to communicate with her daughter when needed.  Update 03/08/2021: Pt states  her weights vary within her baseline at 158-160 lbs with no reported symptoms of swelling or congestion of mucus. Pt aware of what to do if acute symptoms occur and remains in the GREEN zone with no encountered needs or problems. Will continue to reiterate on the plan of care and stress ongoing adherence as a prevention measures to avoid hospitalizations. Adherent with all medications with sufficient transportation to all medical appointments.   Patient Goals/Self-Care Activities: Take all medications as prescribed Attend all scheduled provider appointments Call pharmacy for medication refills 3-7 days in advance of running out of medications Perform IADL's (shopping, preparing meals, housekeeping, managing finances) independently Call provider office for new concerns or questions   Follow Up Plan:  Telephone follow up appointment with care management team member scheduled for:  March 2023 The patient has been provided with contact information for the care management team and has been advised to call with any health related questions or concerns.       Raina Mina, RN Care Management Coordinator [place] Office [phone]

## 2021-03-16 DIAGNOSIS — I130 Hypertensive heart and chronic kidney disease with heart failure and stage 1 through stage 4 chronic kidney disease, or unspecified chronic kidney disease: Secondary | ICD-10-CM | POA: Diagnosis not present

## 2021-03-16 DIAGNOSIS — S82891D Other fracture of right lower leg, subsequent encounter for closed fracture with routine healing: Secondary | ICD-10-CM | POA: Diagnosis not present

## 2021-03-16 DIAGNOSIS — E785 Hyperlipidemia, unspecified: Secondary | ICD-10-CM | POA: Diagnosis not present

## 2021-03-16 DIAGNOSIS — I5032 Chronic diastolic (congestive) heart failure: Secondary | ICD-10-CM | POA: Diagnosis not present

## 2021-03-16 DIAGNOSIS — Z952 Presence of prosthetic heart valve: Secondary | ICD-10-CM | POA: Diagnosis not present

## 2021-03-16 DIAGNOSIS — N182 Chronic kidney disease, stage 2 (mild): Secondary | ICD-10-CM | POA: Diagnosis not present

## 2021-03-22 DIAGNOSIS — S82891D Other fracture of right lower leg, subsequent encounter for closed fracture with routine healing: Secondary | ICD-10-CM | POA: Diagnosis not present

## 2021-03-22 DIAGNOSIS — I130 Hypertensive heart and chronic kidney disease with heart failure and stage 1 through stage 4 chronic kidney disease, or unspecified chronic kidney disease: Secondary | ICD-10-CM | POA: Diagnosis not present

## 2021-03-22 DIAGNOSIS — N182 Chronic kidney disease, stage 2 (mild): Secondary | ICD-10-CM | POA: Diagnosis not present

## 2021-03-22 DIAGNOSIS — E785 Hyperlipidemia, unspecified: Secondary | ICD-10-CM | POA: Diagnosis not present

## 2021-03-22 DIAGNOSIS — Z952 Presence of prosthetic heart valve: Secondary | ICD-10-CM | POA: Diagnosis not present

## 2021-03-22 DIAGNOSIS — I5032 Chronic diastolic (congestive) heart failure: Secondary | ICD-10-CM | POA: Diagnosis not present

## 2021-04-01 ENCOUNTER — Other Ambulatory Visit: Payer: Self-pay | Admitting: *Deleted

## 2021-04-01 NOTE — Patient Outreach (Signed)
?[facility]) Care Management ?Telephonic RN Care Manager Note ? ? ?04/01/2021 ?Name:  Megan Pitts MRN:  [id number] DOB:  March 15, 1927 ? ?Summary: ?Pt continue to manager her ongoing HF remaining in the GREEN zone with no acute issues or symptoms. Reports her ongoing baseline weight at 159-160 lbs with no weigh gained. ? ?Recommendations/Changes made from today's visit: ?Will continue to encouraged ongoing adherence with managing her care related to HG management.  ? ?Subjective: ?Megan Pitts is an 88 y.o. year old female who is a primary patient of Ginger Organ., MD. The care management team was consulted for assistance with care management and/or care coordination needs.   ? ?Telephonic RN Care Manager completed Telephone Visit today. ? ?Objective:  ? ?Medications Reviewed Today   ? ? Reviewed by Tobi Bastos, RN (Registered Nurse) on 12/08/20 at 1815  Med List Status: <None>  ? ?Medication Order Taking? Sig Documenting Provider Last Dose Status Informant  ?amLODipine (NORVASC) 10 MG tablet [id number] Yes Take 10 mg by mouth daily. [provider] Taking Active Family Member  ?aspirin 81 MG tablet [id number] Yes Take 81 mg by mouth daily.   [provider] Taking Active Family Member  ?bisacodyl (DULCOLAX) 10 MG suppository [id number] Yes Place 1 suppository (10 mg total) rectally daily as needed for moderate constipation. Lavina Hamman, MD Taking Active   ?docusate sodium (COLACE) 100 MG capsule [id number] Yes Take 1 capsule (100 mg total) by mouth 2 (two) times daily as needed for mild constipation. Lavina Hamman, MD Taking Active   ?donepezil (ARICEPT) 10 MG tablet [id number] Yes Take 10 mg by mouth at bedtime.  [provider] Taking Active Family Member  ?         ?Med Note Marcy Salvo May 05, 2015  4:08 PM)    ?enoxaparin (LOVENOX) 40 MG/0.4ML injection [id number]  Inject 0.4 mLs (40 mg total) into the skin daily. Ethelda Chick, [place]  Expired  10/14/20 2359   ?hydrochlorothiazide (HYDRODIURIL) 25 MG tablet [id number] Yes Take 25 mg by mouth daily. [provider] Taking Active Family Member  ?HYDROcodone-acetaminophen (NORCO) 5-325 MG tablet [id number] No Take 1-2 tablets by mouth every 6 (six) hours as needed for severe pain.  ?Patient not taking: Reported on 12/08/2020  ? Ethelda Chick, PA-C Not Taking Active   ?methocarbamol (ROBAXIN) 500 MG tablet [id number] Yes Take 1 tablet by mouth as needed. For spasms/muscle tension [provider] Taking Active Family Member  ?olmesartan (BENICAR) 40 MG tablet [id number] Yes Take 40 mg by mouth daily. [provider] Taking Active Family Member  ?psyllium (HYDROCIL/METAMUCIL) 95 % PACK [id number] Yes Take 1 packet by mouth daily. Lavina Hamman, MD Taking Active   ?Vitamin D, Ergocalciferol, (DRISDOL) 50000 units CAPS capsule [id number] Yes Take 50,000 Units by mouth every 7 (seven) days. On Sundays [provider] Taking Active Family Member  ? ?  ?  ? ?  ? ? ? ?SDOH:  (Social Determinants of Health) assessments and interventions performed:  ? ? ? ?Care Plan ? ?Review of patient past medical history, allergies, medications, health status, including review of consultants reports, laboratory and other test data, was performed as part of comprehensive evaluation for care management services.  ? ?Care Plan : RN Care Manager  ?Updates made by Tobi Bastos, RN since 04/01/2021 12:00 AM  ?  ? ?Problem: Knowledge Deficit related to CHF and care coordination needs   ?  Priority: High  ?  ? ?Long-Range Goal: Development plan of care for Management of CHF   ?Start Date: 12/08/2020  ?Expected End Date: 08/08/2021  ?This Visit's Progress: On track  ?Recent Progress: On track  ?Priority: High  ?Note:   ?Current Barriers:  ?Knowledge Deficits related to plan of care for management of CHF  ? ?RNCM Clinical Goal(s):  ?Patient will verbalize understanding of plan for management of CHF as  evidenced by verbalizing an understanding of the plan discussed today. ?take all medications exactly as prescribed and will call provider for medication related questions as evidenced by reviewed medication today with no changes ?attend all scheduled medical appointments: as reviewed today as evidenced by confirming sufficient transportation from her primary caregiver daughter [name]) ? ?Interventions: ?Inter-disciplinary care team collaboration (see longitudinal plan of care) ?Evaluation of current treatment plan related to  self management and patient's adherence to plan as established by provider ? ? ?Heart Failure Interventions:  (Status:  Goal on track:  Yes.) Long Term Goal ?Provided education on low sodium diet ?Advised patient to weigh each morning after emptying bladder ?Discussed importance of daily weight and advised patient to weigh and record daily ?Discussed the importance of keeping all appointments with provider ?Provided patient with education about the role of exercise in the management of heart failure ?Update-12/22: Pt continue to do well with no acute needs at this time. Will continue to encourage ongoing adherence to the reviewed and discussed plan of care. Noted updates presented in the plan of care. Weights reported 158-159 lbs with no acute symptoms or swelling reported.  ?Update 01/27/2021: Pt continues to report she is doing well with no residual retention of fluid. States she is taking all her medications, has sufficient transportation and reports her ongoing daily weights at baseline 158-159 lbs with no reported issues or symptoms.Pt continues to participate with HHPT weekly and continues to improve with no reported falls or issues. No new needs as RN iterated on the plan of care and will continue to encouraged adherence to the discussed plan. Verified ongoing support system and encouraged pt to communicate with her daughter when needed.  ?Update 03/08/2021: Pt states her weights vary  within her baseline at 158-160 lbs with no reported symptoms of swelling or congestion of mucus. Pt aware of what to do if acute symptoms occur and remains in the GREEN zone with no encountered needs or problems. Will continue to reiterate on the plan of care and stress ongoing adherence as a prevention measures to avoid hospitalizations. Adherent with all medications with sufficient  ?transportation to all medical appointments.  ?Update 04/01/2021: RN spoke with pt today who indicates she contains to maintain her ideal weight at her baseline 159-160 lbs with no acute symptoms. Pt denies any SOB or swelling with no encountered problems since the last conversation. Will continue to reiterate on the plan of care and encouraged adherence with her ongoing management of care with daily weights and continue to verified pt remains in the GREEN zone with no acute issues. Pt continues to reports support from her daughter with sufficient transportation to her medical appointments and adherent with taking all her prescribed medications. ? ?Patient Goals/Self-Care Activities: ?Take all medications as prescribed ?Attend all scheduled provider appointments ?Call pharmacy for medication refills 3-7 days in advance of running out of medications ?Perform IADL's (shopping, preparing meals, housekeeping, managing finances) independently ?Call provider office for new concerns or questions  ? ?Follow Up Plan:  Telephone follow up appointment with  care management team member scheduled for:  April 2023 ?The patient has been provided with contact information for the care management team and has been advised to call with any health related questions or concerns.   ?  ? ? ? ?Raina Mina, RN ?Care Management Coordinator ?[place] ?Main Office [phone]  ?

## 2021-05-02 ENCOUNTER — Other Ambulatory Visit: Payer: Medicare Other | Admitting: *Deleted

## 2021-05-02 NOTE — Patient Outreach (Signed)
[facility]) Care Management Telephonic RN Care Manager Note   05/02/2021 Name:  Megan Pitts MRN:  [id number] DOB:  [birth year]  Summary: Pt continues to do well with no acute events or needs mentioned at this time Pt continue to manager her HF remaining at her baseline weights with no fluid retention noted. Pt continues to adhere to the discussed plan of care with no needs over the last month.   Recommendations/Changes made from today's visit: Will continue to encourage ongoing adherence and address all questions accordingly. Will verify pt continues to be aware of who to call with any acute symptoms or related HF issues.   Subjective: Megan Pitts is an 88 y.o. year old female who is a primary patient of Cleatis Polka., MD. The care management team was consulted for assistance with care management and/or care coordination needs.    Telephonic RN Care Manager completed Telephone Visit today.  Objective:   Medications Reviewed Today     Reviewed by Alejandro Mulling, RN (Registered Nurse) on 12/08/20 at 1815  Med List Status: <None>   Medication Order Taking? Sig Documenting Provider Last Dose Status Informant  amLODipine (NORVASC) 10 MG tablet [id number] Yes Take 10 mg by mouth daily. [provider] Taking Active Family Member  aspirin 81 MG tablet [id number] Yes Take 81 mg by mouth daily.   [provider] Taking Active Family Member  bisacodyl (DULCOLAX) 10 MG suppository [id number] Yes Place 1 suppository (10 mg total) rectally daily as needed for moderate constipation. Rolly Salter, MD Taking Active   docusate sodium (COLACE) 100 MG capsule [id number] Yes Take 1 capsule (100 mg total) by mouth 2 (two) times daily as needed for mild constipation. Rolly Salter, MD Taking Active   donepezil (ARICEPT) 10 MG tablet [id number] Yes Take 10 mg by mouth at bedtime.  [provider] Taking Active Family Member           Med Note Nancy Marus,  Jennye Moccasin May 05, 2015  4:08 PM)    enoxaparin (LOVENOX) 40 MG/0.4ML injection [id number]  Inject 0.4 mLs (40 mg total) into the skin daily. Vernetta Honey, [place]  Expired 10/14/20 2359   hydrochlorothiazide (HYDRODIURIL) 25 MG tablet [id number] Yes Take 25 mg by mouth daily. [provider] Taking Active Family Member  HYDROcodone-acetaminophen (NORCO) 5-325 MG tablet [id number] No Take 1-2 tablets by mouth every 6 (six) hours as needed for severe pain.  Patient not taking: Reported on 12/08/2020   Vernetta Honey, PA-C Not Taking Active   methocarbamol (ROBAXIN) 500 MG tablet [id number] Yes Take 1 tablet by mouth as needed. For spasms/muscle tension [provider] Taking Active Family Member  olmesartan (BENICAR) 40 MG tablet [id number] Yes Take 40 mg by mouth daily. [provider] Taking Active Family Member  psyllium (HYDROCIL/METAMUCIL) 95 % PACK [id number] Yes Take 1 packet by mouth daily. Rolly Salter, MD Taking Active   Vitamin D, Ergocalciferol, (DRISDOL) 50000 units CAPS capsule [id number] Yes Take 50,000 Units by mouth every 7 (seven) days. On Sundays [provider] Taking Active Family Member             SDOH:  (Social Determinants of Health) assessments and interventions performed:     Care Plan  Review of patient past medical history, allergies, medications, health status, including review of consultants reports, laboratory and other test data, was performed as part of comprehensive evaluation for care management services.  Care Plan : RN Care Manager  Updates made by Alejandro Mulling, RN since 05/02/2021 12:00 AM     Problem: Knowledge Deficit related to CHF and care coordination needs   Priority: High     Long-Range Goal: Development plan of care for Management of CHF   Start Date: 12/08/2020  Expected End Date: 08/08/2021  This Visit's Progress: On track  Recent Progress: On track  Priority: High  Note:   Current  Barriers:  Knowledge Deficits related to plan of care for management of CHF   RNCM Clinical Goal(s):  Patient will verbalize understanding of plan for management of CHF as evidenced by verbalizing an understanding of the plan discussed today. take all medications exactly as prescribed and will call provider for medication related questions as evidenced by reviewed medication today with no changes attend all scheduled medical appointments: as reviewed today as evidenced by confirming sufficient transportation from her primary caregiver daughter Alexia Freestone)  Interventions: Inter-disciplinary care team collaboration (see longitudinal plan of care) Evaluation of current treatment plan related to  self management and patient's adherence to plan as established by provider Update-12/22: Pt continue to do well with no acute needs at this time. Will continue to encourage ongoing adherence to the reviewed and discussed plan of care. Noted updates presented in the plan of care. Weights reported 158-159 lbs with no acute symptoms or swelling reported.  Update 01/27/2021: Pt continues to report she is doing well with no residual retention of fluid. States she is taking all her medications, has sufficient transportation and reports her ongoing daily weights at baseline 158-159 lbs with no reported issues or symptoms.Pt continues to participate with HHPT weekly and continues to improve with no reported falls or issues. No new needs as RN iterated on the plan of care and will continue to encouraged adherence to the discussed plan. Verified ongoing support system and encouraged pt to communicate with her daughter when needed.  Update 03/08/2021: Pt states her weights vary within her baseline at 158-160 lbs with no reported symptoms of swelling or congestion of mucus. Pt aware of what to do if acute symptoms occur and remains in the GREEN zone with no encountered needs or problems. Will continue to reiterate on the plan of  care and stress ongoing adherence as a prevention measures to avoid hospitalizations. Adherent with all medications with sufficient  transportation to all medical appointments.  Update 04/01/2021: RN spoke with pt today who indicates she contains to maintain her ideal weight at her baseline 159-160 lbs with no acute symptoms. Pt denies any SOB or swelling with no encountered problems since the last conversation. Will continue to reiterate on the plan of care and encouraged adherence with her ongoing management of care with daily weights and continue to verified pt remains in the GREEN zone with no acute issues. Pt continues to reports support from her daughter with sufficient transportation to her medical appointments and adherent with taking all her prescribed medications. Update 05/02/2021:Pt reports she continues to do well with no acute events over the last month. Pt reports her weights are at baseline with no noted fluid retention of related symptoms with her ongoing HF. Pt continues to manage her HF with no encountered issues or events. Reviewed and discussed the current plan of care with issues to address at this time. Pt remains in the GREEN zone and continues to be adherent to the discussed plan of care.  Heart Failure Interventions:  (Status:  Goal on track:  Yes.) Long Term Goal Provided education on low sodium diet Advised patient to weigh each morning after emptying bladder Discussed importance of daily weight and advised patient to weigh and record daily Discussed the importance of keeping all appointments with provider Provided patient with education about the role of exercise in the management of heart failure Patient Goals/Self-Care Activities: Take all medications as prescribed Attend all scheduled provider appointments Call pharmacy for medication refills 3-7 days in advance of running out of medications Perform IADL's (shopping, preparing meals, housekeeping, managing finances)  independently Call provider office for new concerns or questions   Follow Up Plan:  Telephone follow up appointment with care management team member scheduled for:  May 2023 The patient has been provided with contact information for the care management team and has been advised to call with any health related questions or concerns.       Elliot Cousin, RN Care Management Coordinator [facility] Main Office [phone]

## 2021-05-30 ENCOUNTER — Other Ambulatory Visit: Payer: Self-pay | Admitting: *Deleted

## 2021-05-30 NOTE — Patient Outreach (Signed)
[facility]) Care Management Telephonic RN Care Manager Note   05/30/2021 Name:  Megan Pitts MRN:  [id number] DOB:  [birth year]  Summary: Pt doing well with weights at baseline and in the GREEN zone with no needs or acute events over the last month since the last encounter.   Recommendations/Changes made from today's visit: Discussed the ongoing plan of care and encouraged adherence with her ongoing management of care related to her HF.  Subjective: Megan Pitts is an 88 y.o. year old female who is a primary patient of Megan Organ., MD. The care management team was consulted for assistance with care management and/or care coordination needs.    Telephonic RN Care Manager completed Telephone Visit today.  Objective:   Medications Reviewed Today     Reviewed by Tobi Bastos, RN (Registered Nurse) on 12/08/20 at 1815  Med List Status: <None>   Medication Order Taking? Sig Documenting Provider Last Dose Status Informant  amLODipine (NORVASC) 10 MG tablet [id number] Yes Take 10 mg by mouth daily. [provider] Taking Active Family Member  aspirin 81 MG tablet [id number] Yes Take 81 mg by mouth daily.   [provider] Taking Active Family Member  bisacodyl (DULCOLAX) 10 MG suppository [id number] Yes Place 1 suppository (10 mg total) rectally daily as needed for moderate constipation. Lavina Hamman, MD Taking Active   docusate sodium (COLACE) 100 MG capsule [id number] Yes Take 1 capsule (100 mg total) by mouth 2 (two) times daily as needed for mild constipation. Lavina Hamman, MD Taking Active   donepezil (ARICEPT) 10 MG tablet [id number] Yes Take 10 mg by mouth at bedtime.  [provider] Taking Active Family Member           Med Note Brett Albino, Wetzel Bjornstad May 05, 2015  4:08 PM)    enoxaparin (LOVENOX) 40 MG/0.4ML injection [id number]  Inject 0.4 mLs (40 mg total) into the skin daily. Ethelda Chick, [place]  Expired 10/14/20 2359    hydrochlorothiazide (HYDRODIURIL) 25 MG tablet [id number] Yes Take 25 mg by mouth daily. [provider] Taking Active Family Member  HYDROcodone-acetaminophen (NORCO) 5-325 MG tablet [id number] No Take 1-2 tablets by mouth every 6 (six) hours as needed for severe pain.  Patient not taking: Reported on 12/08/2020   Ethelda Chick, PA-C Not Taking Active   methocarbamol (ROBAXIN) 500 MG tablet [id number] Yes Take 1 tablet by mouth as needed. For spasms/muscle tension [provider] Taking Active Family Member  olmesartan (BENICAR) 40 MG tablet [id number] Yes Take 40 mg by mouth daily. [provider] Taking Active Family Member  psyllium (HYDROCIL/METAMUCIL) 95 % PACK [id number] Yes Take 1 packet by mouth daily. Lavina Hamman, MD Taking Active   Vitamin D, Ergocalciferol, (DRISDOL) 50000 units CAPS capsule [id number] Yes Take 50,000 Units by mouth every 7 (seven) days. On Sundays [provider] Taking Active Family Member             SDOH:  (Social Determinants of Health) assessments and interventions performed:     Care Plan  Review of patient past medical history, allergies, medications, health status, including review of consultants reports, laboratory and other test data, was performed as part of comprehensive evaluation for care management services.   Care Plan : RN Care Manager  Updates made by Tobi Bastos, RN since 05/30/2021 12:00 AM     Problem: Knowledge Deficit related to CHF and care coordination needs  Priority: High     Long-Range Goal: Development plan of care for Management of CHF   Start Date: 12/08/2020  Expected End Date: 08/08/2021  This Visit's Progress: On track  Recent Progress: On track  Priority: High  Note:   Current Barriers:  Knowledge Deficits related to plan of care for management of CHF   RNCM Clinical Goal(s):  Patient will verbalize understanding of plan for management of CHF as evidenced by  verbalizing an understanding of the plan discussed today. take all medications exactly as prescribed and will call provider for medication related questions as evidenced by reviewed medication today with no changes attend all scheduled medical appointments: as reviewed today as evidenced by confirming sufficient transportation from her primary caregiver daughter [name])  Interventions: Inter-disciplinary care team collaboration (see longitudinal plan of care) Evaluation of current treatment plan related to  self management and patient's adherence to plan as established by provider Update-12/22: Pt continue to do well with no acute needs at this time. Will continue to encourage ongoing adherence to the reviewed and discussed plan of care. Noted updates presented in the plan of care. Weights reported 158-159 lbs with no acute symptoms or swelling reported.  Update 01/27/2021: Pt continues to report she is doing well with no residual retention of fluid. States she is taking all her medications, has sufficient transportation and reports her ongoing daily weights at baseline 158-159 lbs with no reported issues or symptoms.Pt continues to participate with HHPT weekly and continues to improve with no reported falls or issues. No new needs as RN iterated on the plan of care and will continue to encouraged adherence to the discussed plan. Verified ongoing support system and encouraged pt to communicate with her daughter when needed.  Update 03/08/2021: Pt states her weights vary within her baseline at 158-160 lbs with no reported symptoms of swelling or congestion of mucus. Pt aware of what to do if acute symptoms occur and remains in the GREEN zone with no encountered needs or problems. Will continue to reiterate on the plan of care and stress ongoing adherence as a prevention measures to avoid hospitalizations. Adherent with all medications with sufficient  transportation to all medical appointments.  Update  04/01/2021: RN spoke with pt today who indicates she contains to maintain her ideal weight at her baseline 159-160 lbs with no acute symptoms. Pt denies any SOB or swelling with no encountered problems since the last conversation. Will continue to reiterate on the plan of care and encouraged adherence with her ongoing management of care with daily weights and continue to verified pt remains in the GREEN zone with no acute issues. Pt continues to reports support from her daughter with sufficient transportation to her medical appointments and adherent with taking all her prescribed medications. Update 05/02/2021:Pt reports she continues to do well with no acute events over the last month. Pt reports her weights are at baseline with no noted fluid retention of related symptoms with her ongoing HF. Pt continues to manage her HF with no encountered issues or events. Reviewed and discussed the current plan of care with issues to address at this time. Pt remains in the GREEN zone and continues to be adherent to the discussed plan of care. Update 05/30/2021:Spoke with pt today who continues to do well with no acute issues or reported needs. Pt remains at her baseline weights of 159-160 lbs with no residual symptoms of swelling and remains in the GREEN zone.  Plan of care discussed with no  acute changes or needs as pt continues to manage her care independently with her ongoing provider appointments.  Heart Failure Interventions:  (Status:  Goal on track:  Yes.) Long Term Goal Provided education on low sodium diet Advised patient to weigh each morning after emptying bladder Discussed importance of daily weight and advised patient to weigh and record daily Discussed the importance of keeping all appointments with provider Provided patient with education about the role of exercise in the management of heart failure Patient Goals/Self-Care Activities: Take all medications as prescribed Attend all scheduled provider  appointments Call pharmacy for medication refills 3-7 days in advance of running out of medications Perform IADL's (shopping, preparing meals, housekeeping, managing finances) independently Call provider office for new concerns or questions   Follow Up Plan:  Telephone follow up appointment with care management team member scheduled for:  June 2023 The patient has been provided with contact information for the care management team and has been advised to call with any health related questions or concerns.        Raina Mina, RN Care Management Coordinator [place] Office [phone]

## 2021-06-14 DIAGNOSIS — K5900 Constipation, unspecified: Secondary | ICD-10-CM | POA: Diagnosis not present

## 2021-06-14 DIAGNOSIS — R42 Dizziness and giddiness: Secondary | ICD-10-CM | POA: Diagnosis not present

## 2021-06-14 DIAGNOSIS — I129 Hypertensive chronic kidney disease with stage 1 through stage 4 chronic kidney disease, or unspecified chronic kidney disease: Secondary | ICD-10-CM | POA: Diagnosis not present

## 2021-06-24 DIAGNOSIS — M79662 Pain in left lower leg: Secondary | ICD-10-CM | POA: Diagnosis not present

## 2021-06-29 ENCOUNTER — Other Ambulatory Visit: Payer: Self-pay | Admitting: *Deleted

## 2021-06-29 NOTE — Patient Outreach (Signed)
[facility]) Care Management Telephonic RN Care Manager Note   06/29/2021 Name:  Megan Pitts MRN:  [id number] DOB:  [birth year]  Summary: Pt doing well with no acute issues and continues to manage her care independently. No additional needs at address at this time.  Recommendations/Changes made from today's visit: All goals met with interventions. Will close case and update the provider on pt's disposition.  Subjective: Megan Pitts is an 88 y.o. year old female who is a primary patient of Megan Organ., Pitts. The care management team was consulted for assistance with care management and/or care coordination needs.    Telephonic RN Care Manager completed Telephone Visit today.  Objective:   Medications Reviewed Today     Reviewed by Tobi Bastos, RN (Registered Nurse) on 12/08/20 at 1815  Med List Status: <None>   Medication Order Taking? Sig Documenting Provider Last Dose Status Informant  amLODipine (NORVASC) 10 MG tablet [id number] Yes Take 10 mg by mouth daily. Provider, Historical, Pitts Taking Active Family Member  aspirin 81 MG tablet [id number] Yes Take 81 mg by mouth daily.   Provider, Historical, Pitts Taking Active Family Member  bisacodyl (DULCOLAX) 10 MG suppository [id number] Yes Place 1 suppository (10 mg total) rectally daily as needed for moderate constipation. Megan Pitts Taking Active   docusate sodium (COLACE) 100 MG capsule [id number] Yes Take 1 capsule (100 mg total) by mouth 2 (two) times daily as needed for mild constipation. Megan Pitts Taking Active   donepezil (ARICEPT) 10 MG tablet [id number] Yes Take 10 mg by mouth at bedtime.  Provider, Historical, Pitts Taking Active Family Member           Med Note Brett Albino, Wetzel [place]orn[place] May 05, 2015  4:08 PM)    enoxaparin (LOVENOX) 40 MG/0.4ML injection [id number]  Inject 0.4 mLs (40 mg total) into the skin daily. Megan Pitts, [place]  Expired 10/14/20 2359   hydrochlorothiazide  (HYDRODIURIL) 25 MG tablet [id number] Yes Take 25 mg by mouth daily. Provider, Historical, Pitts Taking Active Family Member  HYDROcodone-acetaminophen (NORCO) 5-325 MG tablet [id number] No Take 1-2 tablets by mouth every 6 (six) hours as needed for severe pain.  Patient not taking: Reported on 12/08/2020   Megan Pitts Not Taking Active   methocarbamol (ROBAXIN) 500 MG tablet [id number] Yes Take 1 tablet by mouth as needed. For spasms/muscle tension Provider, Historical, Pitts Taking Active Family Member  olmesartan (BENICAR) 40 MG tablet [id number] Yes Take 40 mg by mouth daily. Provider, Historical, Pitts Taking Active Family Member  psyllium (HYDROCIL/METAMUCIL) 95 % PACK [id number] Yes Take 1 packet by mouth daily. Megan Pitts Taking Active   Vitamin D, Ergocalciferol, (DRISDOL) 50000 units CAPS capsule [id number] Yes Take 50,000 Units by mouth every 7 (seven) days. On Sundays Provider, Historical, Pitts Taking Active Family Member             SDOH:  (Social Determinants of Health) assessments and interventions performed:     Care Plan  Review of patient past medical history, allergies, medications, health status, including review of consultants reports, laboratory and other test data, was performed as part of comprehensive evaluation for care management services.   Care Plan : RN Care Manager  Updates made by Tobi Bastos, RN since 06/29/2021 12:00 AM     Problem: Knowledge Deficit related to CHF and care coordination needs   Priority: High     Long-Range Goal:  Development plan of care for Management of CHF Completed 06/29/2021  Start Date: 12/08/2020  Expected End Date: 08/08/2021  This Visit's Progress: On track  Recent Progress: On track  Priority: High  Note:   Current Barriers:  Knowledge Deficits related to plan of care for management of CHF   RNCM Clinical Goal(s):  Patient will verbalize understanding of plan for management of CHF as evidenced by  verbalizing an understanding of the plan discussed today. take all medications exactly as prescribed and will call provider for medication related questions as evidenced by reviewed medication today with no changes attend all scheduled medical appointments: as reviewed today as evidenced by confirming sufficient transportation from her primary caregiver daughter [name])  Interventions: Inter-disciplinary care team collaboration (see longitudinal plan of care) Evaluation of current treatment plan related to  self management and patient's adherence to plan as established by provider Update-12/22: Pt continue to do well with no acute needs at this time. Will continue to encourage ongoing adherence to the reviewed and discussed plan of care. Noted updates presented in the plan of care. Weights reported 158-159 lbs with no acute symptoms or swelling reported.  Update 01/27/2021: Pt continues to report she is doing well with no residual retention of fluid. States she is taking all her medications, has sufficient transportation and reports her ongoing daily weights at baseline 158-159 lbs with no reported issues or symptoms.Pt continues to participate with HHPT weekly and continues to improve with no reported falls or issues. No new needs as RN iterated on the plan of care and will continue to encouraged adherence to the discussed plan. Verified ongoing support system and encouraged pt to communicate with her daughter when needed.  Update 03/08/2021: Pt states her weights vary within her baseline at 158-160 lbs with no reported symptoms of swelling or congestion of mucus. Pt aware of what to do if acute symptoms occur and remains in the GREEN zone with no encountered needs or problems. Will continue to reiterate on the plan of care and stress ongoing adherence as a prevention measures to avoid hospitalizations. Adherent with all medications with sufficient  transportation to all medical appointments.  Update  04/01/2021: RN spoke with pt today who indicates she contains to maintain her ideal weight at her baseline 159-160 lbs with no acute symptoms. Pt denies any SOB or swelling with no encountered problems since the last conversation. Will continue to reiterate on the plan of care and encouraged adherence with her ongoing management of care with daily weights and continue to verified pt remains in the GREEN zone with no acute issues. Pt continues to reports support from her daughter with sufficient transportation to her medical appointments and adherent with taking all her prescribed medications. Update 05/02/2021:Pt reports she continues to do well with no acute events over the last month. Pt reports her weights are at baseline with no noted fluid retention of related symptoms with her ongoing HF. Pt continues to manage her HF with no encountered issues or events. Reviewed and discussed the current plan of care with issues to address at this time. Pt remains in the GREEN zone and continues to be adherent to the discussed plan of care. Update 05/30/2021:Spoke with pt today who continues to do well with no acute issues or reported needs. Pt remains at her baseline weights of 159-160 lbs with no residual symptoms of swelling and remains in the GREEN zone.  Plan of care discussed with no acute changes or needs as pt continues  to manage her care independently with her ongoing provider appointments.  Heart Failure Interventions:  (Status:  Goal on track:  Yes.) Long Term Goal Provided education on low sodium diet Advised patient to weigh each morning after emptying bladder Discussed importance of daily weight and advised patient to weigh and record daily Discussed the importance of keeping all appointments with provider Provided patient with education about the role of exercise in the management of heart failure Patient Goals/Self-Care Activities: Take all medications as prescribed Attend all scheduled provider  appointments Call pharmacy for medication refills 3-7 days in advance of running out of medications Perform IADL's (shopping, preparing meals, housekeeping, managing finances) independently Call provider office for new concerns or questions   Follow Up Plan:  Telephone follow up appointment with care management team member scheduled for:  June 2023 The patient has been provided with contact information for the care management team and has been advised to call with any health related questions or concerns.    6/21 Case closure with all goals met. Plan of care discussed with all interventions as pt remains asymptomatic with residual  fluid retention of issues presented at this time.      Raina Mina, RN Care Management Coordinator [place] Office [phone]

## 2021-08-11 DIAGNOSIS — R7989 Other specified abnormal findings of blood chemistry: Secondary | ICD-10-CM | POA: Diagnosis not present

## 2021-08-11 DIAGNOSIS — M109 Gout, unspecified: Secondary | ICD-10-CM | POA: Diagnosis not present

## 2021-08-11 DIAGNOSIS — M810 Age-related osteoporosis without current pathological fracture: Secondary | ICD-10-CM | POA: Diagnosis not present

## 2021-08-11 DIAGNOSIS — I10 Essential (primary) hypertension: Secondary | ICD-10-CM | POA: Diagnosis not present

## 2021-08-11 DIAGNOSIS — E039 Hypothyroidism, unspecified: Secondary | ICD-10-CM | POA: Diagnosis not present

## 2021-08-11 DIAGNOSIS — E785 Hyperlipidemia, unspecified: Secondary | ICD-10-CM | POA: Diagnosis not present

## 2021-08-18 DIAGNOSIS — F0280 Dementia in other diseases classified elsewhere without behavioral disturbance: Secondary | ICD-10-CM | POA: Diagnosis not present

## 2021-08-18 DIAGNOSIS — Z1331 Encounter for screening for depression: Secondary | ICD-10-CM | POA: Diagnosis not present

## 2021-08-18 DIAGNOSIS — G300 Alzheimer's disease with early onset: Secondary | ICD-10-CM | POA: Diagnosis not present

## 2021-08-18 DIAGNOSIS — Z7189 Other specified counseling: Secondary | ICD-10-CM | POA: Diagnosis not present

## 2021-08-18 DIAGNOSIS — R82998 Other abnormal findings in urine: Secondary | ICD-10-CM | POA: Diagnosis not present

## 2021-08-18 DIAGNOSIS — Z0000 Encounter for general adult medical examination without abnormal findings: Secondary | ICD-10-CM | POA: Diagnosis not present

## 2021-08-18 DIAGNOSIS — Z952 Presence of prosthetic heart valve: Secondary | ICD-10-CM | POA: Diagnosis not present

## 2021-08-18 DIAGNOSIS — Z1339 Encounter for screening examination for other mental health and behavioral disorders: Secondary | ICD-10-CM | POA: Diagnosis not present

## 2021-08-18 DIAGNOSIS — I350 Nonrheumatic aortic (valve) stenosis: Secondary | ICD-10-CM | POA: Diagnosis not present

## 2021-08-18 DIAGNOSIS — I129 Hypertensive chronic kidney disease with stage 1 through stage 4 chronic kidney disease, or unspecified chronic kidney disease: Secondary | ICD-10-CM | POA: Diagnosis not present

## 2021-08-18 DIAGNOSIS — I700 Atherosclerosis of aorta: Secondary | ICD-10-CM | POA: Diagnosis not present

## 2021-08-18 DIAGNOSIS — E039 Hypothyroidism, unspecified: Secondary | ICD-10-CM | POA: Diagnosis not present

## 2021-08-18 DIAGNOSIS — N1831 Chronic kidney disease, stage 3a: Secondary | ICD-10-CM | POA: Diagnosis not present

## 2021-08-18 DIAGNOSIS — I10 Essential (primary) hypertension: Secondary | ICD-10-CM | POA: Diagnosis not present

## 2021-08-18 DIAGNOSIS — M810 Age-related osteoporosis without current pathological fracture: Secondary | ICD-10-CM | POA: Diagnosis not present

## 2021-08-30 ENCOUNTER — Other Ambulatory Visit (HOSPITAL_COMMUNITY): Payer: Self-pay | Admitting: *Deleted

## 2021-08-31 ENCOUNTER — Ambulatory Visit (HOSPITAL_COMMUNITY)
Admission: RE | Admit: 2021-08-31 | Discharge: 2021-08-31 | Disposition: A | Discharge status: Home / Self Care | Payer: Medicare Other | Source: Ambulatory Visit | Attending: Internal Medicine | Admitting: Internal Medicine

## 2021-08-31 DIAGNOSIS — M810 Age-related osteoporosis without current pathological fracture: Secondary | ICD-10-CM | POA: Insufficient documentation

## 2021-08-31 MED ORDER — DENOSUMAB 60 MG/ML ~~LOC~~ SOSY
60.0000 mg | PREFILLED_SYRINGE | Freq: Once | SUBCUTANEOUS | Status: AC
  Administered 2021-08-31: 60 mg via SUBCUTANEOUS

## 2021-08-31 MED ORDER — DENOSUMAB 60 MG/ML ~~LOC~~ SOSY
PREFILLED_SYRINGE | SUBCUTANEOUS | Status: AC
  Filled 2021-08-31: qty 1

## 2021-10-20 DIAGNOSIS — Z23 Encounter for immunization: Secondary | ICD-10-CM | POA: Diagnosis not present

## 2022-03-23 DIAGNOSIS — L817 Pigmented purpuric dermatosis: Secondary | ICD-10-CM | POA: Diagnosis not present

## 2022-03-23 DIAGNOSIS — D2271 Melanocytic nevi of right lower limb, including hip: Secondary | ICD-10-CM | POA: Diagnosis not present

## 2022-03-23 DIAGNOSIS — D2272 Melanocytic nevi of left lower limb, including hip: Secondary | ICD-10-CM | POA: Diagnosis not present

## 2022-03-23 DIAGNOSIS — L821 Other seborrheic keratosis: Secondary | ICD-10-CM | POA: Diagnosis not present

## 2022-03-23 DIAGNOSIS — D692 Other nonthrombocytopenic purpura: Secondary | ICD-10-CM | POA: Diagnosis not present

## 2022-04-06 DIAGNOSIS — H5789 Other specified disorders of eye and adnexa: Secondary | ICD-10-CM | POA: Diagnosis not present

## 2022-08-29 DIAGNOSIS — I129 Hypertensive chronic kidney disease with stage 1 through stage 4 chronic kidney disease, or unspecified chronic kidney disease: Secondary | ICD-10-CM | POA: Diagnosis not present

## 2022-08-29 DIAGNOSIS — N1831 Chronic kidney disease, stage 3a: Secondary | ICD-10-CM | POA: Diagnosis not present

## 2022-08-29 DIAGNOSIS — E785 Hyperlipidemia, unspecified: Secondary | ICD-10-CM | POA: Diagnosis not present

## 2022-08-29 DIAGNOSIS — M810 Age-related osteoporosis without current pathological fracture: Secondary | ICD-10-CM | POA: Diagnosis not present

## 2022-08-29 DIAGNOSIS — E039 Hypothyroidism, unspecified: Secondary | ICD-10-CM | POA: Diagnosis not present

## 2022-08-29 DIAGNOSIS — R7301 Impaired fasting glucose: Secondary | ICD-10-CM | POA: Diagnosis not present

## 2022-08-29 DIAGNOSIS — M109 Gout, unspecified: Secondary | ICD-10-CM | POA: Diagnosis not present

## 2022-09-05 DIAGNOSIS — H6123 Impacted cerumen, bilateral: Secondary | ICD-10-CM | POA: Diagnosis not present

## 2022-09-05 DIAGNOSIS — R82998 Other abnormal findings in urine: Secondary | ICD-10-CM | POA: Diagnosis not present

## 2022-09-05 DIAGNOSIS — I10 Essential (primary) hypertension: Secondary | ICD-10-CM | POA: Diagnosis not present

## 2022-09-05 DIAGNOSIS — Z7189 Other specified counseling: Secondary | ICD-10-CM | POA: Diagnosis not present

## 2022-09-05 DIAGNOSIS — D692 Other nonthrombocytopenic purpura: Secondary | ICD-10-CM | POA: Diagnosis not present

## 2022-09-05 DIAGNOSIS — N1831 Chronic kidney disease, stage 3a: Secondary | ICD-10-CM | POA: Diagnosis not present

## 2022-09-05 DIAGNOSIS — F02A0 Dementia in other diseases classified elsewhere, mild, without behavioral disturbance, psychotic disturbance, mood disturbance, and anxiety: Secondary | ICD-10-CM | POA: Diagnosis not present

## 2022-09-05 DIAGNOSIS — Z0000 Encounter for general adult medical examination without abnormal findings: Secondary | ICD-10-CM | POA: Diagnosis not present

## 2022-09-05 DIAGNOSIS — R7301 Impaired fasting glucose: Secondary | ICD-10-CM | POA: Diagnosis not present

## 2022-09-05 DIAGNOSIS — E039 Hypothyroidism, unspecified: Secondary | ICD-10-CM | POA: Diagnosis not present

## 2022-09-05 DIAGNOSIS — I700 Atherosclerosis of aorta: Secondary | ICD-10-CM | POA: Diagnosis not present

## 2022-09-05 DIAGNOSIS — Z1331 Encounter for screening for depression: Secondary | ICD-10-CM | POA: Diagnosis not present

## 2022-09-05 DIAGNOSIS — Z1339 Encounter for screening examination for other mental health and behavioral disorders: Secondary | ICD-10-CM | POA: Diagnosis not present

## 2022-09-05 DIAGNOSIS — I129 Hypertensive chronic kidney disease with stage 1 through stage 4 chronic kidney disease, or unspecified chronic kidney disease: Secondary | ICD-10-CM | POA: Diagnosis not present

## 2022-09-05 DIAGNOSIS — G300 Alzheimer's disease with early onset: Secondary | ICD-10-CM | POA: Diagnosis not present

## 2022-09-05 DIAGNOSIS — E785 Hyperlipidemia, unspecified: Secondary | ICD-10-CM | POA: Diagnosis not present

## 2022-09-19 ENCOUNTER — Other Ambulatory Visit (HOSPITAL_COMMUNITY): Payer: Self-pay

## 2022-09-20 ENCOUNTER — Ambulatory Visit (HOSPITAL_COMMUNITY)
Admission: RE | Admit: 2022-09-20 | Discharge: 2022-09-20 | Disposition: A | Discharge status: Home / Self Care | Payer: Medicare Other | Source: Ambulatory Visit | Attending: Internal Medicine | Admitting: Internal Medicine

## 2022-09-20 DIAGNOSIS — M810 Age-related osteoporosis without current pathological fracture: Secondary | ICD-10-CM | POA: Diagnosis not present

## 2022-09-20 MED ORDER — DENOSUMAB 60 MG/ML ~~LOC~~ SOSY: 60.0000 mg | PREFILLED_SYRINGE | Freq: Once | SUBCUTANEOUS | Status: AC

## 2022-09-20 MED ORDER — DENOSUMAB 60 MG/ML ~~LOC~~ SOSY
PREFILLED_SYRINGE | SUBCUTANEOUS | Status: AC
  Administered 2022-09-20: 60 mg via SUBCUTANEOUS
  Filled 2022-09-20: qty 1

## 2022-10-19 NOTE — Progress Notes (Unsigned)
Cardiology Office Note:    Date:  10/24/2022   ID:  Megan Pitts, DOB [birth year], MRN [id number]  PCP:  Cleatis Polka., MD [facility] Cardiologist: Jadavion Spoelstra [place], MD   Reason for visit: 33-month follow-up  History of Present Illness:    Megan Pitts is a 89 y.o. female with a hx of severe AS and had AVR with a #76mm [place] stentless porcine valve in July of 2007. Echo in 1/18 and 6/20 showed good valve function with only trivial AI. Other issues include HTN, PVCs, HLD, post op atrial fib, GERD, Gilbert's syndrome. She has a history of bradycardia when taking beta blockers.   She is seen with her daughter today. She is still living independently. Walks daily. Has a lot of friends. Denies any chest pain, dizziness, SOB or edema. Eats what she likes.     Past Medical History:  Diagnosis Date   A-fib [facility])    Aortic stenosis    s/p AVR 07/2005(porcine)   Carotid bruit 09/2008   <50% B   GERD (gastroesophageal reflux disease)    Gilbert's syndrome    Hyperlipidemia    Hypertension    IFG (impaired fasting glucose)    OAB (overactive bladder)    Osteopenia    Phlebitis    UPPER EXTREMITY   PVC's (premature ventricular contractions)    Shingles    Varicose veins    s/p treatment    Vertigo     Past Surgical History:  Procedure Laterality Date   ABDOMINAL HYSTERECTOMY  1963   partial   AORTIC VALVE REPLACEMENT  08/01/2005   WITH A #23MM [place] STENTLESS PORCINE AORTIC VALVE   CARDIAC CATHETERIZATION  07/25/2005   EF 60% THE AORTIC VALVE IS HEAVILY CALCIFIED WITH REDUCED OPENING. NO MVP   FOOT SURGERY     HAND SURGERY     HEMORRHOID SURGERY  1980's   ORIF ANKLE FRACTURE Right 09/11/2020   Procedure: OPEN REDUCTION INTERNAL FIXATION (ORIF) ANKLE FRACTURE;  Surgeon: Bjorn Pippin, MD;  Location: [facility] OR;  Service: Orthopedics;  Laterality: Right;   US ECHOCARDIOGRAPHY  04/14/2008   EF 55-60%. NORMAL   US ECHOCARDIOGRAPHY  11/21/2005   EF 55-60%   US  ECHOCARDIOGRAPHY  07/20/2005   EF 55-60%   US ECHOCARDIOGRAPHY  10/19/2004   EF 55-60%   VARICOSE VEIN SURGERY  2010   laser treatment    Current Medications: Current Meds  Medication Sig   amLODipine (NORVASC) 10 MG tablet Take 10 mg by mouth daily.   aspirin 81 MG tablet Take 81 mg by mouth daily.     bisacodyl (DULCOLAX) 10 MG suppository Place 1 suppository (10 mg total) rectally daily as needed for moderate constipation.   donepezil (ARICEPT) 10 MG tablet Take 10 mg by mouth at bedtime.    hydrochlorothiazide (HYDRODIURIL) 25 MG tablet Take 25 mg by mouth daily.   HYDROcodone-acetaminophen (NORCO) 5-325 MG tablet Take 1-2 tablets by mouth every 6 (six) hours as needed for severe pain.   levothyroxine (SYNTHROID) 50 MCG tablet Take 50 mcg by mouth every morning.   methocarbamol (ROBAXIN) 500 MG tablet Take 1 tablet by mouth as needed. For spasms/muscle tension   olmesartan (BENICAR) 40 MG tablet Take 40 mg by mouth daily.   psyllium (HYDROCIL/METAMUCIL) 95 % PACK Take 1 packet by mouth daily.   Vitamin D, Ergocalciferol, (DRISDOL) 50000 units CAPS capsule Take 50,000 Units by mouth every 7 (seven) days. On Sundays  Allergies:   Synthroid [levothyroxine sodium] and Tramadol hcl   Social History   Socioeconomic History   Marital status: Widowed    Spouse name: Not on file   Number of children: 2   Years of education: 36   Highest education level: 12th grade  Occupational History   Occupation: retired  Tobacco Use   Smoking status: Never   Smokeless tobacco: Never  Vaping Use   Vaping status: Never Used  Substance and Sexual Activity   Alcohol use: No   Drug use: No   Sexual activity: Not Currently  Other Topics Concern   Not on file  Social History Narrative   Not on file   Social Determinants of Health   Financial Resource Strain: Low Risk  (06/30/2019)   Overall Financial Resource Strain (CARDIA)    Difficulty of Paying Living Expenses: Not hard at all   Food Insecurity: No Food Insecurity (12/08/2020)   Hunger Vital Sign    Worried About Running Out of Food in the Last Year: Never true    Ran Out of Food in the Last Year: Never true  Transportation Needs: No Transportation Needs (12/08/2020)   PRAPARE - Administrator, Civil Service (Medical): No    Lack of Transportation (Non-Medical): No  Recent Concern: Transportation Needs - Unmet Transportation Needs (11/26/2020)   PRAPARE - Administrator, Civil Service (Medical): Yes    Lack of Transportation (Non-Medical): Not on file  Physical Activity: Inactive (06/30/2019)   Exercise Vital Sign    Days of Exercise per Week: 0 days    Minutes of Exercise per Session: 0 min  Stress: No Stress Concern Present (06/30/2019)   [employer] of Occupational Health - Occupational Stress Questionnaire    Feeling of Stress : Only a little  Social Connections: Moderately Integrated (06/30/2019)   Social Connection and Isolation Panel [NHANES]    Frequency of Communication with Friends and Family: More than three times a week    Frequency of Social Gatherings with Friends and Family: More than three times a week    Attends Religious Services: More than 4 times per year    Active Member of Clubs or Organizations: No    Attends Banker Meetings: 1 to 4 times per year    Marital Status: Widowed     Family History: The patient's family history includes Alcohol abuse in her brother; Alzheimer's disease in her father; Leukemia in her brother; Pneumonia (age of onset: 105) in her mother.  ROS:   Please see the history of present illness.     EKGs/Labs/Other Studies Reviewed:    EKG Interpretation Date/Time:  Tuesday October 24 2022 09:31:12 EDT Ventricular Rate:  66 PR Interval:  196 QRS Duration:  92 QT Interval:  420 QTC Calculation: 440 R Axis:   -20  Text Interpretation: Normal sinus rhythm Minimal voltage criteria for LVH, may be normal variant (  [place]ell product ) Possible Anterior infarct , age undetermined When compared with ECG of 10-Sep-2020 12:49, No significant change since last tracing Confirmed by [place], Aahana Elza [phone]) on 10/24/2022 9:32:20 AM    Recent Labs: No results found for requested labs within last 365 days.   Recent Lipid Panel No results found for: "CHOL", "TRIG", "HDL", "LDLCALC", "LDLDIRECT"  Dated 08/29/22: cholesterol 255, triglycerides 132, HDL 61, LDL 168. A1c 5.6%. CBC, CMET and TSH normal  Physical Exam:    VS:  BP 130/60 (BP Location: Right Arm, Patient Position: Sitting,  Cuff Size: Normal)   Pulse 66   Ht 5\' 2"  (1.575 m)   Wt 163 lb (73.9 kg)   BMI 29.81 kg/m    No data found.  Wt Readings from Last 3 Encounters:  10/24/22 163 lb (73.9 kg)  02/24/21 161 lb 3.2 oz (73.1 kg)  01/01/21 160 lb (72.6 kg)     GEN:  Well nourished, well developed in no acute distress HEENT: Normal NECK: No JVD, carotid bruits L>R CARDIAC: RRR,  1-2/6 systolic and diastolic  RUSB RESPIRATORY:  Clear to auscultation without rales, wheezing or rhonchi  ABDOMEN: Soft, non-tender, non-distended MUSCULOSKELETAL: No edema; No deformity  SKIN: Warm and dry NEUROLOGIC:  Alert and oriented PSYCHIATRIC:  Normal affect     ASSESSMENT AND PLAN   History of aortic stenosis status post AVR with tissue prosthesis in 2007 -Stable echo in 2020 -Denies angina, dyspnea, heart failure symptoms and syncope. - mild AI murmur on exam  Hypertension, well controlled -Continue current medications.  Hyperlipidemia -LDL 168. No longer term benefit from statin given her age.   Disposition - Follow-up in 1 year       Medication Adjustments/Labs and Tests Ordered: Current medicines are reviewed at length with the patient today.  Concerns regarding medicines are outlined above.  Orders Placed This Encounter  Procedures   EKG 12-Lead   No orders of the defined types were placed in this encounter.   There are no Patient  Instructions on file for this visit.   Signed, Darrian Grzelak [place], MD  10/24/2022 9:40 AM    [facility]

## 2022-10-24 ENCOUNTER — Ambulatory Visit: Payer: Medicare Other | Attending: Cardiology | Admitting: Cardiology

## 2022-10-24 ENCOUNTER — Encounter: Payer: Self-pay | Admitting: Cardiology

## 2022-10-24 VITALS — BP 130/60 | HR 66 | Ht 62.0 in | Wt 163.0 lb

## 2022-10-24 DIAGNOSIS — I493 Ventricular premature depolarization: Secondary | ICD-10-CM | POA: Insufficient documentation

## 2022-10-24 DIAGNOSIS — I5032 Chronic diastolic (congestive) heart failure: Secondary | ICD-10-CM | POA: Diagnosis not present

## 2022-10-24 DIAGNOSIS — I10 Essential (primary) hypertension: Secondary | ICD-10-CM | POA: Diagnosis not present

## 2022-10-24 DIAGNOSIS — Z952 Presence of prosthetic heart valve: Secondary | ICD-10-CM | POA: Diagnosis not present

## 2022-10-24 DIAGNOSIS — Z23 Encounter for immunization: Secondary | ICD-10-CM | POA: Diagnosis not present

## 2022-10-24 NOTE — Patient Instructions (Signed)
Medication Instructions:  Continue all current medications *If you need a refill on your cardiac medications before your next appointment, please call your pharmacy*   Lab Work: none  need to change your treatment, we will call you to review the results.   Testing/Procedures: none   Follow-Up: At [facility], you and your health needs are our priority.  As part of our continuing mission to provide you with exceptional heart care, we have created designated Provider Care Teams.  These Care Teams include your primary Cardiologist (physician) and Advanced Practice Providers (APPs -  Physician Assistants and Nurse Practitioners) who all work together to provide you with the care you need, when you need it.    Your next appointment:   1 year(s). Call office in April 2025 to schedule appt. In October 2025  Provider:   Peter [place], MD

## 2022-11-28 DIAGNOSIS — E785 Hyperlipidemia, unspecified: Secondary | ICD-10-CM | POA: Diagnosis not present

## 2022-11-28 DIAGNOSIS — N1831 Chronic kidney disease, stage 3a: Secondary | ICD-10-CM | POA: Diagnosis not present

## 2022-11-28 DIAGNOSIS — E039 Hypothyroidism, unspecified: Secondary | ICD-10-CM | POA: Diagnosis not present

## 2022-11-28 DIAGNOSIS — I129 Hypertensive chronic kidney disease with stage 1 through stage 4 chronic kidney disease, or unspecified chronic kidney disease: Secondary | ICD-10-CM | POA: Diagnosis not present

## 2023-03-19 ENCOUNTER — Other Ambulatory Visit (HOSPITAL_COMMUNITY): Payer: Self-pay | Admitting: *Deleted

## 2023-03-20 ENCOUNTER — Ambulatory Visit (HOSPITAL_COMMUNITY)
Admission: RE | Admit: 2023-03-20 | Discharge: 2023-03-20 | Disposition: A | Discharge status: Home / Self Care | Payer: Medicare Other | Source: Ambulatory Visit | Attending: Internal Medicine | Admitting: Internal Medicine

## 2023-03-20 DIAGNOSIS — M810 Age-related osteoporosis without current pathological fracture: Secondary | ICD-10-CM | POA: Diagnosis not present

## 2023-03-20 MED ORDER — DENOSUMAB 60 MG/ML ~~LOC~~ SOSY
60.0000 mg | PREFILLED_SYRINGE | Freq: Once | SUBCUTANEOUS | Status: AC
  Administered 2023-03-20: 60 mg via SUBCUTANEOUS

## 2023-03-20 MED ORDER — DENOSUMAB 60 MG/ML ~~LOC~~ SOSY
PREFILLED_SYRINGE | SUBCUTANEOUS | Status: AC
  Filled 2023-03-20: qty 1

## 2023-06-05 DIAGNOSIS — M25562 Pain in left knee: Secondary | ICD-10-CM | POA: Diagnosis not present

## 2023-06-10 ENCOUNTER — Other Ambulatory Visit: Payer: Self-pay

## 2023-06-10 ENCOUNTER — Emergency Department (HOSPITAL_COMMUNITY)

## 2023-06-10 ENCOUNTER — Inpatient Hospital Stay (HOSPITAL_COMMUNITY)
Admission: EM | Admit: 2023-06-10 | Discharge: 2023-06-18 | DRG: 519 | Disposition: A | Discharge status: Skilled Nursing Facility | Attending: Internal Medicine | Admitting: Internal Medicine

## 2023-06-10 ENCOUNTER — Encounter (HOSPITAL_COMMUNITY): Payer: Self-pay

## 2023-06-10 DIAGNOSIS — Z952 Presence of prosthetic heart valve: Secondary | ICD-10-CM | POA: Diagnosis not present

## 2023-06-10 DIAGNOSIS — R413 Other amnesia: Secondary | ICD-10-CM | POA: Diagnosis present

## 2023-06-10 DIAGNOSIS — Z811 Family history of alcohol abuse and dependence: Secondary | ICD-10-CM | POA: Diagnosis not present

## 2023-06-10 DIAGNOSIS — N179 Acute kidney failure, unspecified: Secondary | ICD-10-CM | POA: Diagnosis present

## 2023-06-10 DIAGNOSIS — D72829 Elevated white blood cell count, unspecified: Secondary | ICD-10-CM | POA: Diagnosis present

## 2023-06-10 DIAGNOSIS — H9190 Unspecified hearing loss, unspecified ear: Secondary | ICD-10-CM | POA: Diagnosis present

## 2023-06-10 DIAGNOSIS — Z79890 Hormone replacement therapy (postmenopausal): Secondary | ICD-10-CM | POA: Diagnosis not present

## 2023-06-10 DIAGNOSIS — M79661 Pain in right lower leg: Secondary | ICD-10-CM | POA: Diagnosis not present

## 2023-06-10 DIAGNOSIS — M47816 Spondylosis without myelopathy or radiculopathy, lumbar region: Secondary | ICD-10-CM | POA: Diagnosis not present

## 2023-06-10 DIAGNOSIS — Z953 Presence of xenogenic heart valve: Secondary | ICD-10-CM

## 2023-06-10 DIAGNOSIS — M5126 Other intervertebral disc displacement, lumbar region: Principal | ICD-10-CM | POA: Diagnosis present

## 2023-06-10 DIAGNOSIS — M5106 Intervertebral disc disorders with myelopathy, lumbar region: Secondary | ICD-10-CM | POA: Diagnosis not present

## 2023-06-10 DIAGNOSIS — M5430 Sciatica, unspecified side: Principal | ICD-10-CM

## 2023-06-10 DIAGNOSIS — E871 Hypo-osmolality and hyponatremia: Secondary | ICD-10-CM | POA: Diagnosis present

## 2023-06-10 DIAGNOSIS — Z0189 Encounter for other specified special examinations: Secondary | ICD-10-CM | POA: Diagnosis not present

## 2023-06-10 DIAGNOSIS — E861 Hypovolemia: Secondary | ICD-10-CM | POA: Diagnosis not present

## 2023-06-10 DIAGNOSIS — I10 Essential (primary) hypertension: Secondary | ICD-10-CM | POA: Diagnosis present

## 2023-06-10 DIAGNOSIS — M48061 Spinal stenosis, lumbar region without neurogenic claudication: Secondary | ICD-10-CM | POA: Diagnosis not present

## 2023-06-10 DIAGNOSIS — I130 Hypertensive heart and chronic kidney disease with heart failure and stage 1 through stage 4 chronic kidney disease, or unspecified chronic kidney disease: Secondary | ICD-10-CM | POA: Diagnosis not present

## 2023-06-10 DIAGNOSIS — M47817 Spondylosis without myelopathy or radiculopathy, lumbosacral region: Secondary | ICD-10-CM | POA: Diagnosis not present

## 2023-06-10 DIAGNOSIS — I129 Hypertensive chronic kidney disease with stage 1 through stage 4 chronic kidney disease, or unspecified chronic kidney disease: Secondary | ICD-10-CM | POA: Diagnosis present

## 2023-06-10 DIAGNOSIS — Z79899 Other long term (current) drug therapy: Secondary | ICD-10-CM | POA: Diagnosis not present

## 2023-06-10 DIAGNOSIS — Z90710 Acquired absence of both cervix and uterus: Secondary | ICD-10-CM

## 2023-06-10 DIAGNOSIS — I480 Paroxysmal atrial fibrillation: Secondary | ICD-10-CM | POA: Diagnosis present

## 2023-06-10 DIAGNOSIS — E039 Hypothyroidism, unspecified: Secondary | ICD-10-CM | POA: Diagnosis not present

## 2023-06-10 DIAGNOSIS — K219 Gastro-esophageal reflux disease without esophagitis: Secondary | ICD-10-CM | POA: Diagnosis present

## 2023-06-10 DIAGNOSIS — Z820 Family history of epilepsy and other diseases of the nervous system: Secondary | ICD-10-CM

## 2023-06-10 DIAGNOSIS — R2681 Unsteadiness on feet: Secondary | ICD-10-CM | POA: Diagnosis not present

## 2023-06-10 DIAGNOSIS — D75839 Thrombocytosis, unspecified: Secondary | ICD-10-CM | POA: Diagnosis present

## 2023-06-10 DIAGNOSIS — Z66 Do not resuscitate: Secondary | ICD-10-CM | POA: Diagnosis present

## 2023-06-10 DIAGNOSIS — F0390 Unspecified dementia without behavioral disturbance: Secondary | ICD-10-CM | POA: Diagnosis not present

## 2023-06-10 DIAGNOSIS — M8580 Other specified disorders of bone density and structure, unspecified site: Secondary | ICD-10-CM | POA: Diagnosis not present

## 2023-06-10 DIAGNOSIS — M5136 Other intervertebral disc degeneration, lumbar region with discogenic back pain only: Secondary | ICD-10-CM | POA: Diagnosis not present

## 2023-06-10 DIAGNOSIS — N1831 Chronic kidney disease, stage 3a: Secondary | ICD-10-CM | POA: Diagnosis present

## 2023-06-10 DIAGNOSIS — Z806 Family history of leukemia: Secondary | ICD-10-CM | POA: Diagnosis not present

## 2023-06-10 DIAGNOSIS — Z7982 Long term (current) use of aspirin: Secondary | ICD-10-CM

## 2023-06-10 DIAGNOSIS — E785 Hyperlipidemia, unspecified: Secondary | ICD-10-CM | POA: Diagnosis present

## 2023-06-10 DIAGNOSIS — Z8672 Personal history of thrombophlebitis: Secondary | ICD-10-CM

## 2023-06-10 DIAGNOSIS — M419 Scoliosis, unspecified: Secondary | ICD-10-CM | POA: Diagnosis not present

## 2023-06-10 DIAGNOSIS — N183 Chronic kidney disease, stage 3 unspecified: Secondary | ICD-10-CM | POA: Diagnosis present

## 2023-06-10 DIAGNOSIS — R278 Other lack of coordination: Secondary | ICD-10-CM | POA: Diagnosis not present

## 2023-06-10 DIAGNOSIS — R2689 Other abnormalities of gait and mobility: Secondary | ICD-10-CM | POA: Diagnosis not present

## 2023-06-10 DIAGNOSIS — M5441 Lumbago with sciatica, right side: Secondary | ICD-10-CM | POA: Diagnosis not present

## 2023-06-10 DIAGNOSIS — E66811 Obesity, class 1: Secondary | ICD-10-CM | POA: Diagnosis not present

## 2023-06-10 DIAGNOSIS — Z6830 Body mass index (BMI) 30.0-30.9, adult: Secondary | ICD-10-CM

## 2023-06-10 DIAGNOSIS — I5032 Chronic diastolic (congestive) heart failure: Secondary | ICD-10-CM | POA: Diagnosis not present

## 2023-06-10 DIAGNOSIS — M6281 Muscle weakness (generalized): Secondary | ICD-10-CM | POA: Diagnosis not present

## 2023-06-10 DIAGNOSIS — M7138 Other bursal cyst, other site: Secondary | ICD-10-CM | POA: Diagnosis present

## 2023-06-10 LAB — BASIC METABOLIC PANEL WITH GFR
Anion gap: 11 (ref 5–15)
BUN: 23 mg/dL (ref 8–23)
CO2: 22 mmol/L (ref 22–32)
Calcium: 9.3 mg/dL (ref 8.9–10.3)
Chloride: 100 mmol/L (ref 98–111)
Creatinine, Ser: 1.11 mg/dL — ABNORMAL HIGH (ref 0.44–1.00)
GFR, Estimated: 46 mL/min — ABNORMAL LOW (ref >60)
Glucose, Bld: 158 mg/dL — ABNORMAL HIGH (ref 70–99)
Potassium: 4 mmol/L (ref 3.5–5.1)
Sodium: 133 mmol/L — ABNORMAL LOW (ref 135–145)

## 2023-06-10 LAB — CBC
HCT: 44.8 % (ref 36.0–46.0)
Hemoglobin: 15.4 g/dL — ABNORMAL HIGH (ref 12.0–15.0)
MCH: 28.8 pg (ref 26.0–34.0)
MCHC: 34.4 g/dL (ref 30.0–36.0)
MCV: 83.7 fL (ref 80.0–100.0)
Platelets: 403 10*3/uL — ABNORMAL HIGH (ref 150–400)
RBC: 5.35 MIL/uL — ABNORMAL HIGH (ref 3.87–5.11)
RDW: 12.8 % (ref 11.5–15.5)
WBC: 15.3 10*3/uL — ABNORMAL HIGH (ref 4.0–10.5)
nRBC: 0 % (ref 0.0–0.2)

## 2023-06-10 MED ORDER — OXYCODONE-ACETAMINOPHEN 5-325 MG PO TABS
1.0000 | ORAL_TABLET | Freq: Four times a day (QID) | ORAL | Status: DC | PRN
  Administered 2023-06-10 – 2023-06-14 (×10): 1 via ORAL
  Filled 2023-06-10 (×11): qty 1

## 2023-06-10 MED ORDER — IRBESARTAN 300 MG PO TABS
300.0000 mg | ORAL_TABLET | Freq: Every day | ORAL | Status: DC
  Administered 2023-06-10 – 2023-06-12 (×3): 300 mg via ORAL
  Filled 2023-06-10 (×3): qty 1

## 2023-06-10 MED ORDER — FENTANYL CITRATE PF 50 MCG/ML IJ SOSY: 25.0000 ug | PREFILLED_SYRINGE | Freq: Once | INTRAMUSCULAR | Status: DC

## 2023-06-10 MED ORDER — LIDOCAINE 5 % EX PTCH
1.0000 | MEDICATED_PATCH | CUTANEOUS | Status: DC
  Administered 2023-06-10 – 2023-06-18 (×8): 1 via TRANSDERMAL
  Filled 2023-06-10 (×10): qty 1

## 2023-06-10 MED ORDER — HYDROCODONE-ACETAMINOPHEN 5-325 MG PO TABS
1.0000 | ORAL_TABLET | Freq: Once | ORAL | Status: AC
  Administered 2023-06-10: 1 via ORAL
  Filled 2023-06-10: qty 1

## 2023-06-10 MED ORDER — DONEPEZIL HCL 10 MG PO TABS
10.0000 mg | ORAL_TABLET | Freq: Every day | ORAL | Status: DC
  Administered 2023-06-10 – 2023-06-17 (×7): 10 mg via ORAL
  Filled 2023-06-10 (×7): qty 1

## 2023-06-10 MED ORDER — IBUPROFEN 400 MG PO TABS
400.0000 mg | ORAL_TABLET | Freq: Once | ORAL | Status: AC | PRN
  Administered 2023-06-10: 400 mg via ORAL
  Filled 2023-06-10: qty 1

## 2023-06-10 MED ORDER — GADOBUTROL 1 MMOL/ML IV SOLN
7.0000 mL | Freq: Once | INTRAVENOUS | Status: AC | PRN
  Administered 2023-06-10: 7 mL via INTRAVENOUS

## 2023-06-10 MED ORDER — AMLODIPINE BESYLATE 5 MG PO TABS
5.0000 mg | ORAL_TABLET | Freq: Every day | ORAL | Status: DC
  Administered 2023-06-11 – 2023-06-12 (×2): 5 mg via ORAL
  Filled 2023-06-10 (×2): qty 1

## 2023-06-10 MED ORDER — LIDOCAINE 5 % EX PTCH
1.0000 | MEDICATED_PATCH | CUTANEOUS | Status: DC
  Administered 2023-06-10 – 2023-06-11 (×2): 1 via TRANSDERMAL
  Filled 2023-06-10 (×3): qty 1

## 2023-06-10 MED ORDER — DICLOFENAC SODIUM 1 % EX GEL
2.0000 g | Freq: Once | CUTANEOUS | Status: DC
  Filled 2023-06-10: qty 100

## 2023-06-10 MED ORDER — ACETAMINOPHEN 10 MG/ML IV SOLN
1000.0000 mg | Freq: Four times a day (QID) | INTRAVENOUS | Status: AC
  Administered 2023-06-11: 1000 mg via INTRAVENOUS
  Filled 2023-06-10: qty 100

## 2023-06-10 MED ORDER — AMLODIPINE BESYLATE 5 MG PO TABS
10.0000 mg | ORAL_TABLET | Freq: Once | ORAL | Status: AC
  Administered 2023-06-10: 10 mg via ORAL
  Filled 2023-06-10: qty 2

## 2023-06-10 MED ORDER — HYDROCHLOROTHIAZIDE 25 MG PO TABS
25.0000 mg | ORAL_TABLET | Freq: Every day | ORAL | Status: DC
  Administered 2023-06-10 – 2023-06-12 (×3): 25 mg via ORAL
  Filled 2023-06-10 (×3): qty 1

## 2023-06-10 MED ORDER — LEVOTHYROXINE SODIUM 50 MCG PO TABS
50.0000 ug | ORAL_TABLET | Freq: Every day | ORAL | Status: DC
  Administered 2023-06-11 – 2023-06-18 (×7): 50 ug via ORAL
  Filled 2023-06-10 (×7): qty 1

## 2023-06-10 MED ORDER — ACETAMINOPHEN 10 MG/ML IV SOLN
1000.0000 mg | Freq: Four times a day (QID) | INTRAVENOUS | Status: DC
  Administered 2023-06-10 (×2): 1000 mg via INTRAVENOUS
  Filled 2023-06-10 (×2): qty 100

## 2023-06-10 MED ORDER — FENTANYL CITRATE PF 50 MCG/ML IJ SOSY
50.0000 ug | PREFILLED_SYRINGE | Freq: Once | INTRAMUSCULAR | Status: AC
  Administered 2023-06-10: 50 ug via INTRAVENOUS
  Filled 2023-06-10: qty 1

## 2023-06-10 NOTE — ED Provider Notes (Signed)
 [place] EMERGENCY DEPARTMENT AT [facility] Provider Note   CSN: [id number] Arrival date & time: 06/10/23  9147     History {Add pertinent medical, surgical, social history, OB history to HPI:1} Chief Complaint  Patient presents with   Leg Pain    Megan Pitts is a 89 y.o. female.  89 year old female with past medical history of hypertension and dementia presenting to the emergency department today with pain in her back and right hip.  This does run down her leg.  The patient states she has been having issues with this for the past week or so.  She injured her left knee a few weeks ago and was favoring her left leg.  She has been having pain in the back rating down the right leg now for the past week or so.  She had an episode last night where the pain got worse and she went to the floor.  She did not hit her head or lose consciousness.  She states that she was able to sit down and did not actually fall.  She states that her back pain is worse since then.  She denies any bowel or bladder dysfunction and has not had any saddle anesthesia.  Denies any weakness but states the pain has worsened.  She denies any fevers.  She is not on any blood thinners.   Leg Pain Associated symptoms: back pain        Home Medications Prior to Admission medications   Medication Sig Start Date End Date Taking? Authorizing Provider  amLODipine  (NORVASC ) 10 MG tablet Take 10 mg by mouth daily. 03/19/19   [provider]  aspirin  81 MG tablet Take 81 mg by mouth daily.      [provider]  bisacodyl  (DULCOLAX) 10 MG suppository Place 1 suppository (10 mg total) rectally daily as needed for moderate constipation. 09/16/20   Kraig Peru, MD  donepezil  (ARICEPT ) 10 MG tablet Take 10 mg by mouth at bedtime.  01/11/15   [provider]  enoxaparin  (LOVENOX ) 40 MG/0.4ML injection Inject 0.4 mLs (40 mg total) into the skin daily. 09/14/20 10/14/20  McBane, Caroline N, PA-C   hydrochlorothiazide  (HYDRODIURIL ) 25 MG tablet Take 25 mg by mouth daily. 03/19/19   [provider]  HYDROcodone -acetaminophen  (NORCO) 5-325 MG tablet Take 1-2 tablets by mouth every 6 (six) hours as needed for severe pain. 09/14/20   McBane, Caroline N, PA-C  levothyroxine (SYNTHROID) 50 MCG tablet Take 50 mcg by mouth every morning. 09/18/22   [provider]  methocarbamol (ROBAXIN) 500 MG tablet Take 1 tablet by mouth as needed. For spasms/muscle tension 05/12/19   [provider]  olmesartan (BENICAR) 40 MG tablet Take 40 mg by mouth daily.    [provider]  psyllium (HYDROCIL/METAMUCIL) 95 % PACK Take 1 packet by mouth daily. 09/16/20   Kraig Peru, MD  Vitamin D , Ergocalciferol , (DRISDOL ) 50000 units CAPS capsule Take 50,000 Units by mouth every 7 (seven) days. On Sundays    [provider]      Allergies    Synthroid [levothyroxine sodium] and Tramadol hcl    Review of Systems   Review of Systems  Musculoskeletal:  Positive for arthralgias and back pain.  All other systems reviewed and are negative.   Physical Exam Updated Vital Signs BP (!) 172/85 (BP Location: Left Arm)   Pulse 79   Temp 98.1 F (36.7 C)   Resp 18   Ht 5\' 3"  (  1.6 m)   Wt 73 kg   SpO2 98%   BMI 28.52 kg/m  Physical Exam Vitals and nursing note reviewed.   Gen: Appears uncomfortable Eyes: PERRL, EOMI HEENT: no oropharyngeal swelling Neck: trachea midline Resp: clear to auscultation bilaterally Card: RRR, no murmurs, rubs, or gallops Abd: nontender, nondistended Extremities: no calf tenderness, no edema MSK: The patient is tender over the mid lumbar spine as well as the right paraspinal region, positive straight leg raise on the right, no significant bony tenderness over the femur or knee noted Neuro: The patient has equal strength and sensation throughout the bilateral lower extremities Vascular: 2+ radial pulses bilaterally, 2+ DP pulses  bilaterally Skin: no rashes Psyc: acting appropriately   ED Results / Procedures / Treatments   Labs (all labs ordered are listed, but only abnormal results are displayed) Labs Reviewed - No data to display  EKG None  Radiology DG FEMUR 1V RIGHT Result Date: 06/10/2023 CLINICAL DATA:  Right hip pain shooting down the leg. EXAM: RIGHT FEMUR 1 VIEW COMPARISON:  None Available. FINDINGS: A single AP view is obtained in 2 films. There is normal bone mineralization for age. There is no AP evidence of femoral fracture or dislocation. No fracture or diastasis of the right hemipelvis and SI joint. The right hemisacrum unremarkable. There are no significant arthritic changes for age. Scattered calcific plaque noted in the superficial femoral artery. Numerous pelvic phleboliths. IMPRESSION: 1. No AP evidence of femoral fracture or dislocation. 2. Atherosclerosis. Electronically Signed   By: Denman Fischer M.D.   On: 06/10/2023 07:05    Procedures Procedures  {Document cardiac monitor, telemetry assessment procedure when appropriate:1}  Medications Ordered in ED Medications  HYDROcodone -acetaminophen  (NORCO/VICODIN) 5-325 MG per tablet 1 tablet (has no administration in time range)  lidocaine  (LIDODERM ) 5 % 1 patch (has no administration in time range)  ibuprofen  (ADVIL ) tablet 400 mg (400 mg Oral Given 06/10/23 4098)    ED Course/ Medical Decision Making/ A&P   {   Click here for ABCD2, HEART and other calculatorsREFRESH Note before signing :1}                              Medical Decision Making 89 year old female with past medical history of hypertension and dementia presenting to the emergency department today with symptoms consistent with sciatica.  Given the acute worsening of the pain will further evaluate her here with a lumbar spine x-ray.  Her x-ray of her right femur is unremarkable I was ordered in triage.  She has received ibuprofen  and still having lot of pain.  Will give the  patient a lidocaine  patch as well as hydrocodone .  She has tolerated this in the past.  She does not have any red flag symptoms for cauda equina syndrome or cord compressing lesion at this time.  I will reevaluate for ultimate disposition.  Amount and/or Complexity of Data Reviewed Radiology: ordered.  Risk Prescription drug management.   ***  {Document critical care time when appropriate:1} {Document review of labs and clinical decision tools ie heart score, Chads2Vasc2 etc:1}  {Document your independent review of radiology images, and any outside records:1} {Document your discussion with family members, caretakers, and with consultants:1} {Document social determinants of health affecting pt's care:1} {Document your decision making why or why not admission, treatments were needed:1} Final Clinical Impression(s) / ED Diagnoses Final diagnoses:  None    Rx / DC Orders ED Discharge Orders  None

## 2023-06-10 NOTE — ED Triage Notes (Signed)
 Pt to ED via pov from home. Pt has had right leg pain since yesterday. Pt has been sedentary until yesterday d/t left knee pain for past 3 weeks. Pt got up to use restroom this am and leg gave out and was not able to get up out of floor d/t pain in right leg.

## 2023-06-10 NOTE — ED Notes (Signed)
 Blood pressure measured twice

## 2023-06-10 NOTE — Consult Note (Signed)
 Reason for Consult: Hnp L2/3 right Referring Physician: Deedra, Megan Pitts is an 89 y.o. female.  HPI: with acute onset right lower extremity weakness and pain right thigh. Awoke this morning unable to bear weight on the right lower extremity  Past Medical History:  Diagnosis Date   A-fib [facility])    Aortic stenosis    s/p AVR 07/2005(porcine)   Carotid bruit 09/2008   <50% B   GERD (gastroesophageal reflux disease)    Gilbert's syndrome    Hyperlipidemia    Hypertension    IFG (impaired fasting glucose)    OAB (overactive bladder)    Osteopenia    Phlebitis    UPPER EXTREMITY   PVC's (premature ventricular contractions)    Shingles    Varicose veins    s/p treatment    Vertigo     Past Surgical History:  Procedure Laterality Date   ABDOMINAL HYSTERECTOMY  1963   partial   AORTIC VALVE REPLACEMENT  08/01/2005   WITH A #23MM TORONTO STENTLESS PORCINE AORTIC VALVE   CARDIAC CATHETERIZATION  07/25/2005   EF 60% THE AORTIC VALVE IS HEAVILY CALCIFIED WITH REDUCED OPENING. NO MVP   FOOT SURGERY     HAND SURGERY     HEMORRHOID SURGERY  1980's   ORIF ANKLE FRACTURE Right 09/11/2020   Procedure: OPEN REDUCTION INTERNAL FIXATION (ORIF) ANKLE FRACTURE;  Surgeon: Micheline Ahr, MD;  Location: [facility] OR;  Service: Orthopedics;  Laterality: Right;   US  ECHOCARDIOGRAPHY  04/14/2008   EF 55-60%. NORMAL   US  ECHOCARDIOGRAPHY  11/21/2005   EF 55-60%   US  ECHOCARDIOGRAPHY  07/20/2005   EF 55-60%   US  ECHOCARDIOGRAPHY  10/19/2004   EF 55-60%   VARICOSE VEIN SURGERY  2010   laser treatment    Family History  Problem Relation Age of Onset   Pneumonia Mother 11   Alzheimer's disease Father    Leukemia Brother    Alcohol  abuse Brother     Social History:  reports that she has never smoked. She has never used smokeless tobacco. She reports that she does not drink alcohol  and does not use drugs.  Allergies:  Allergies  Allergen Reactions   Synthroid [Levothyroxine Sodium]     Pt  stated not sure what happens when she takes this   Tramadol Hcl Other (See Comments)    Medications: I have reviewed the patient's current medications.  Results for orders placed or performed during the hospital encounter of 06/10/23 (from the past 48 hours)  CBC     Status: Abnormal   Collection Time: 06/10/23 12:20 PM  Result Value Ref Range   WBC 15.3 (H) 4.0 - 10.5 K/uL   RBC 5.35 (H) 3.87 - 5.11 MIL/uL   Hemoglobin 15.4 (H) 12.0 - 15.0 g/dL   HCT 19.1 47.8 - 29.5 %   MCV 83.7 80.0 - 100.0 fL   MCH 28.8 26.0 - 34.0 pg   MCHC 34.4 30.0 - 36.0 g/dL   RDW 62.1 30.8 - 65.7 %   Platelets 403 (H) 150 - 400 K/uL   nRBC 0.0 0.0 - 0.2 %    Comment: Performed at [facility], [address] [place], [place] [address]  Basic metabolic panel     Status: Abnormal   Collection Time: 06/10/23 12:20 PM  Result Value Ref Range   Sodium 133 (L) 135 - 145 mmol/L   Potassium 4.0 3.5 - 5.1 mmol/L   Chloride 100 98 - 111 mmol/L  CO2 22 22 - 32 mmol/L   Glucose, Bld 158 (H) 70 - 99 mg/dL    Comment: Glucose reference range applies only to samples taken after fasting for at least 8 hours.   BUN 23 8 - 23 mg/dL   Creatinine, Ser 4.78 (H) 0.44 - 1.00 mg/dL   Calcium  9.3 8.9 - 10.3 mg/dL   GFR, Estimated 46 (L) >60 mL/min    Comment: (NOTE) Calculated using the CKD-EPI Creatinine Equation (2021)    Anion gap 11 5 - 15    Comment: Performed at [facility], [address], [place], [place] [address]    MR LUMBAR SPINE W WO CONTRAST Result Date: 06/10/2023 EXAM: MR Lumbar Spine With and Without Intravenous Contrast. 06/10/2023 02:31:18 PM TECHNIQUE: Multiplanar multisequence MRI of the lumbar spine was performed with the administration of intravenous contrast. COMPARISON: Lumbar spine radiographs 06/21/2023. CLINICAL HISTORY: Myelopathy, acute, lumbar spine. FINDINGS: BONES AND ALIGNMENT: Conventional lumbosacral anatomy is assumed with 5 non-rib-bearing, lumbar-type vertebral bodies.  Modic type 1 degenerative endplate marrow edema at L3-4. 3 mm retrolisthesis of L2 on L3. SPINAL CORD: Conus terminates at L2. SOFT TISSUES: Moderate fatty atrophy of the paraspinal muscles. L1-L2: No disc herniation. No spinal canal stenosis or neural foraminal narrowing. L2-L3: Right central disc extrusion compresses the traversing right L3 nerve root in the subarticular zone. Facet arthropathy contributes to moderate right neural foraminal narrowing. L3-L4: Disc bulge and right greater than left facet arthropathy contribute to severe right and moderate left neural foraminal narrowing and mild spinal canal stenosis. L4-L5: A left eccentric disc bulge and facet arthropathy result in displacement of the traversing left L5 nerve root in the subarticular zone. Moderate bilateral neural foraminal narrowing. L5-S1: Severe left-sided facet arthropathy with 6 mm anteriorly projecting synovial cyst compresses the exiting left L5 nerve root in the neural foramen. IMPRESSION: 1. Severe left-sided facet arthropathy at L5-S1 with a 6 mm anteriorly projecting synovial cyst compressing the exiting left L5 nerve root in the neural foramen. 2. Right central disc extrusion at L2-3 compressing the traversing right L3 nerve root in the subarticular zone, with moderate right neural foraminal narrowing. 3. Disc bulge at L3-4 with severe right and moderate left neural foraminal narrowing and mild spinal canal stenosis. 4. Left eccentric disc bulge at L4-L5 with displacement of the traversing left L5 nerve root in the subarticular zone and moderate bilateral neural foraminal narrowing. Electronically signed by: Audra Blend MD 06/10/2023 03:16 PM EDT RP Workstation: [id number]   DG Lumbar Spine Complete Result Date: 06/10/2023 CLINICAL DATA:  Low back pain beginning yesterday. EXAM: LUMBAR SPINE - COMPLETE 4+ VIEW COMPARISON:  None Available. FINDINGS: There is no evidence of lumbar spine fracture. Alignment is normal. Moderate  degenerative disc disease is seen from levels of L2 to S1. Bilateral lower lumbar facet DJD is seen, worst at L5-S1. Mild levoscoliosis and generalized osteopenia noted. IMPRESSION: No acute findings. Degenerative spondylosis, as described above. Electronically Signed   By: Marlyce Sine M.D.   On: 06/10/2023 10:39   DG FEMUR 1V RIGHT Result Date: 06/10/2023 CLINICAL DATA:  Right hip pain shooting down the leg. EXAM: RIGHT FEMUR 1 VIEW COMPARISON:  None Available. FINDINGS: A single AP view is obtained in 2 films. There is normal bone mineralization for age. There is no AP evidence of femoral fracture or dislocation. No fracture or diastasis of the right hemipelvis and SI joint. The right hemisacrum unremarkable. There are no significant arthritic changes for age. Scattered calcific plaque  noted in the superficial femoral artery. Numerous pelvic phleboliths. IMPRESSION: 1. No AP evidence of femoral fracture or dislocation. 2. Atherosclerosis. Electronically Signed   By: Denman Fischer M.D.   On: 06/10/2023 07:05    Review of Systems  Constitutional:  Positive for activity change.  HENT: Negative.    Eyes: Negative.   Respiratory: Negative.    Cardiovascular: Negative.   Gastrointestinal: Negative.   Endocrine: Negative.   Genitourinary: Negative.   Musculoskeletal:  Positive for gait problem.  Skin: Negative.   Allergic/Immunologic: Negative.   Neurological:  Positive for weakness.  Hematological: Negative.   Psychiatric/Behavioral:  Positive for confusion.    Blood pressure (!) 153/118, pulse 78, temperature (!) 97.4 F (36.3 C), temperature source Oral, resp. rate (!) 23, height 5\' 3"  (1.6 m), weight 73 kg, SpO2 100%. Physical Exam Constitutional:      General: She is not in acute distress.    Appearance: Normal appearance. She is normal weight. She is not ill-appearing.  HENT:     Head: Normocephalic and atraumatic.     Right Ear: External ear normal.     Left Ear: External ear  normal.     Nose: Nose normal.     Mouth/Throat:     Mouth: Mucous membranes are moist.     Pharynx: Oropharynx is clear.  Eyes:     Extraocular Movements: Extraocular movements intact.     Conjunctiva/sclera: Conjunctivae normal.     Pupils: Pupils are equal, round, and reactive to light.  Pulmonary:     Effort: Pulmonary effort is normal.  Abdominal:     General: Abdomen is flat.     Palpations: Abdomen is soft.  Musculoskeletal:        General: Normal range of motion.     Cervical back: Normal range of motion and neck supple.  Skin:    General: Skin is warm and dry.  Neurological:     General: No focal deficit present.     Mental Status: She is alert and oriented to person, place, and time.     Cranial Nerves: Cranial nerves 2-12 are intact.     Sensory: Sensation is intact.     Motor: Weakness present.     Coordination: Coordination is intact.     Deep Tendon Reflexes: Reflexes are normal and symmetric. Babinski sign absent on the right side. Babinski sign absent on the left side.     Comments: Mild weakness right hip flexors  Psychiatric:        Mood and Affect: Mood normal.        Behavior: Behavior normal.        Judgment: Judgment normal.     Assessment/Plan: MRI shows disc eccentric to the right at L2/3. Some slight weakness right hip flexors consistent with disc. There will be no emergent surgery today. Will try with pt tomorrow to see how she does. Will be admitted to medicine, I will follow.   Audie Bleacher 06/10/2023, 4:58 PM

## 2023-06-11 DIAGNOSIS — E039 Hypothyroidism, unspecified: Secondary | ICD-10-CM | POA: Diagnosis present

## 2023-06-11 DIAGNOSIS — I10 Essential (primary) hypertension: Secondary | ICD-10-CM | POA: Diagnosis not present

## 2023-06-11 DIAGNOSIS — D72829 Elevated white blood cell count, unspecified: Secondary | ICD-10-CM | POA: Diagnosis present

## 2023-06-11 DIAGNOSIS — M5126 Other intervertebral disc displacement, lumbar region: Secondary | ICD-10-CM | POA: Diagnosis present

## 2023-06-11 DIAGNOSIS — Z806 Family history of leukemia: Secondary | ICD-10-CM | POA: Diagnosis not present

## 2023-06-11 DIAGNOSIS — I5032 Chronic diastolic (congestive) heart failure: Secondary | ICD-10-CM | POA: Diagnosis not present

## 2023-06-11 DIAGNOSIS — D75839 Thrombocytosis, unspecified: Secondary | ICD-10-CM | POA: Diagnosis present

## 2023-06-11 DIAGNOSIS — Z953 Presence of xenogenic heart valve: Secondary | ICD-10-CM | POA: Diagnosis not present

## 2023-06-11 DIAGNOSIS — M48061 Spinal stenosis, lumbar region without neurogenic claudication: Secondary | ICD-10-CM | POA: Diagnosis present

## 2023-06-11 DIAGNOSIS — Z79899 Other long term (current) drug therapy: Secondary | ICD-10-CM | POA: Diagnosis not present

## 2023-06-11 DIAGNOSIS — E785 Hyperlipidemia, unspecified: Secondary | ICD-10-CM | POA: Diagnosis present

## 2023-06-11 DIAGNOSIS — F0390 Unspecified dementia without behavioral disturbance: Secondary | ICD-10-CM | POA: Diagnosis present

## 2023-06-11 DIAGNOSIS — Z952 Presence of prosthetic heart valve: Secondary | ICD-10-CM

## 2023-06-11 DIAGNOSIS — Z79890 Hormone replacement therapy (postmenopausal): Secondary | ICD-10-CM | POA: Diagnosis not present

## 2023-06-11 DIAGNOSIS — R413 Other amnesia: Secondary | ICD-10-CM | POA: Diagnosis present

## 2023-06-11 DIAGNOSIS — H9190 Unspecified hearing loss, unspecified ear: Secondary | ICD-10-CM | POA: Diagnosis present

## 2023-06-11 DIAGNOSIS — E861 Hypovolemia: Secondary | ICD-10-CM | POA: Diagnosis present

## 2023-06-11 DIAGNOSIS — Z811 Family history of alcohol abuse and dependence: Secondary | ICD-10-CM | POA: Diagnosis not present

## 2023-06-11 DIAGNOSIS — I129 Hypertensive chronic kidney disease with stage 1 through stage 4 chronic kidney disease, or unspecified chronic kidney disease: Secondary | ICD-10-CM | POA: Diagnosis present

## 2023-06-11 DIAGNOSIS — Z820 Family history of epilepsy and other diseases of the nervous system: Secondary | ICD-10-CM | POA: Diagnosis not present

## 2023-06-11 DIAGNOSIS — N183 Chronic kidney disease, stage 3 unspecified: Secondary | ICD-10-CM | POA: Diagnosis not present

## 2023-06-11 DIAGNOSIS — E66811 Obesity, class 1: Secondary | ICD-10-CM | POA: Diagnosis present

## 2023-06-11 DIAGNOSIS — I480 Paroxysmal atrial fibrillation: Secondary | ICD-10-CM | POA: Diagnosis present

## 2023-06-11 DIAGNOSIS — M47816 Spondylosis without myelopathy or radiculopathy, lumbar region: Secondary | ICD-10-CM | POA: Diagnosis present

## 2023-06-11 DIAGNOSIS — Z66 Do not resuscitate: Secondary | ICD-10-CM | POA: Diagnosis present

## 2023-06-11 DIAGNOSIS — N1831 Chronic kidney disease, stage 3a: Secondary | ICD-10-CM | POA: Diagnosis present

## 2023-06-11 DIAGNOSIS — Z7982 Long term (current) use of aspirin: Secondary | ICD-10-CM | POA: Diagnosis not present

## 2023-06-11 DIAGNOSIS — I130 Hypertensive heart and chronic kidney disease with heart failure and stage 1 through stage 4 chronic kidney disease, or unspecified chronic kidney disease: Secondary | ICD-10-CM | POA: Diagnosis not present

## 2023-06-11 DIAGNOSIS — E871 Hypo-osmolality and hyponatremia: Secondary | ICD-10-CM | POA: Diagnosis present

## 2023-06-11 DIAGNOSIS — N179 Acute kidney failure, unspecified: Secondary | ICD-10-CM | POA: Diagnosis present

## 2023-06-11 LAB — URINALYSIS, ROUTINE W REFLEX MICROSCOPIC
Bilirubin Urine: NEGATIVE
Glucose, UA: NEGATIVE mg/dL
Ketones, ur: NEGATIVE mg/dL
Leukocytes,Ua: NEGATIVE
Nitrite: NEGATIVE
Protein, ur: 100 mg/dL — AB
Specific Gravity, Urine: 1.019 (ref 1.005–1.030)
pH: 5 (ref 5.0–8.0)

## 2023-06-11 MED ORDER — ONDANSETRON HCL 4 MG PO TABS: 4.0000 mg | ORAL_TABLET | Freq: Four times a day (QID) | ORAL | Status: DC | PRN

## 2023-06-11 MED ORDER — ALBUTEROL SULFATE (2.5 MG/3ML) 0.083% IN NEBU
2.5000 mg | INHALATION_SOLUTION | Freq: Four times a day (QID) | RESPIRATORY_TRACT | Status: DC | PRN
  Administered 2023-06-17: 2.5 mg via RESPIRATORY_TRACT
  Filled 2023-06-11: qty 3

## 2023-06-11 MED ORDER — ACETAMINOPHEN 650 MG RE SUPP: 650.0000 mg | Freq: Four times a day (QID) | RECTAL | Status: DC | PRN

## 2023-06-11 MED ORDER — ACETAMINOPHEN 325 MG PO TABS
650.0000 mg | ORAL_TABLET | Freq: Four times a day (QID) | ORAL | Status: DC | PRN
  Administered 2023-06-12: 650 mg via ORAL
  Filled 2023-06-11: qty 2

## 2023-06-11 MED ORDER — SODIUM CHLORIDE 0.9% FLUSH
3.0000 mL | Freq: Two times a day (BID) | INTRAVENOUS | Status: DC
Start: 2023-06-11 — End: 2023-06-16
  Administered 2023-06-11 – 2023-06-15 (×6): 3 mL via INTRAVENOUS

## 2023-06-11 MED ORDER — ENOXAPARIN SODIUM 30 MG/0.3ML IJ SOSY
30.0000 mg | PREFILLED_SYRINGE | INTRAMUSCULAR | Status: DC
Start: 2023-06-11 — End: 2023-06-15
  Administered 2023-06-11 – 2023-06-15 (×4): 30 mg via SUBCUTANEOUS
  Filled 2023-06-11 (×4): qty 0.3

## 2023-06-11 MED ORDER — SODIUM CHLORIDE 0.9 % IV SOLN
12.5000 mg | Freq: Four times a day (QID) | INTRAVENOUS | Status: DC | PRN
  Administered 2023-06-11: 12.5 mg via INTRAVENOUS
  Filled 2023-06-11: qty 12.5

## 2023-06-11 MED ORDER — HYDRALAZINE HCL 20 MG/ML IJ SOLN
10.0000 mg | INTRAMUSCULAR | Status: DC | PRN
  Administered 2023-06-11 – 2023-06-15 (×2): 10 mg via INTRAVENOUS
  Filled 2023-06-11 (×3): qty 1

## 2023-06-11 MED ORDER — FENTANYL CITRATE PF 50 MCG/ML IJ SOSY
25.0000 ug | PREFILLED_SYRINGE | INTRAMUSCULAR | Status: DC | PRN
  Administered 2023-06-11 – 2023-06-14 (×9): 25 ug via INTRAVENOUS
  Filled 2023-06-11 (×9): qty 1

## 2023-06-11 MED ORDER — ONDANSETRON HCL 4 MG/2ML IJ SOLN
4.0000 mg | Freq: Four times a day (QID) | INTRAMUSCULAR | Status: DC | PRN
  Administered 2023-06-11 (×2): 4 mg via INTRAVENOUS
  Filled 2023-06-11 (×2): qty 2

## 2023-06-11 NOTE — Progress Notes (Signed)
 CSW received consult for possible SNF placement.  Per Dr. Charlestine Conquest note, patient is to be admitted and he will see patient today to determine next steps.  PT has signed in - will wait for their recommendations prior to proceeding with discharge planning.  Shepard Dicker, MSW, LCSW Transitions of Care  Clinical Social Worker II [phone]

## 2023-06-11 NOTE — Discharge Planning (Signed)
 RNCM consulted regarding safe discharge planning (Home with Home Health vs Skilled Nursing Facility Placement).  Physical Therapy evaluation placed; will follow up after recommendations from PT.     Desirea Mizrahi J. Rachel Budds, BSN, RN, NCM  Transitions of Care  Nurse Case Manager  [facility] Emergency Departments  Operative Services  [phone]

## 2023-06-11 NOTE — ED Provider Notes (Signed)
 I assumed care of the patient at 0 800.  Briefly patient is a 89 year old female with significant leg pain impairing her ability to walk at all.  She had gone to the emergency department and found to have some worsening disc bulge on the right L2-3.  She was seen by neurosurgery and with plans to have her admitted to medicine and repeat assessments.  Discussed with medicine for admission.   Albertus Hughs, DO 06/11/23 [phone]

## 2023-06-11 NOTE — H&P (Signed)
 History and Physical    Patient: Megan Pitts UEA:[id number] DOB: 1927-01-26 DOA: 06/10/2023 DOS: the patient was seen and examined on 06/11/2023 PCP: Jeannine Milroy., MD  Patient coming from: Home  Chief Complaint:  Chief Complaint  Patient presents with   Leg Pain   HPI: Megan Pitts is a 89 y.o. female with medical history significant of hypertension, hyperlipidemia, atrial fibrillation, aortic stenosis s/p aortic valve replacement, hypothyroidism, presents after having a fall.presents with right leg weakness and inability to stand. She is accompanied by her daughter.  At 3:00 AM on the previous day, she experienced sudden weakness in her right leg, preventing her from standing. She did not fall but sat down on the floor and was unable to get up.  Denies actually falling.  She injured her right foot possibly on her walker.  Her daughter and son-in-law assisted her in getting up, and she used her medical alert system to call for help.  She describes the right leg as feeling weak, as if 'there was nothing in there.' She is unable to lift the leg or bear weight on it. There is constant pain in the leg, which worsens when attempting to raise it. These symptoms are new and have not been experienced before.  Three weeks prior, she had an injury to her left knee, described as a sprain, which has since resolved. Following this, she began experiencing pain in her hip, which has persisted.  She is currently experiencing nausea, which she attributes to pain medication. She has not had a bowel movement in the last 48 hours and takes Metamucil regularly. She is generally active, typically walking every morning and sitting outside frequently. No recent falls.  In the emergency department patient was noted to be afebrile blood pressure was elevated up to 201/58, respirations 16-23, and all other vital signs relatively maintained.  Labs from 6/1 significant for WBC 15.3, hemoglobin 15.4, platelets 403,  sodium 133, BUN 23, and creatinine 1.11.  X-rays of the right femur and lumbar spine did not reveal any acute normality and degenerative spondylosis.  MRI of the lumbar spine significant for severe left facet arthropathy at L5-S1 with a 6 mm anterior protruding synovial cyst compressing on the exiting left L4 V nerve root, right central disc protrusion at L2-3 compressing on the transverse right L3 nerve root in the subarticular zone with moderate right neural foraminal narrowing, disc bulge at L3-4 with severe right and moderate left neuroforaminal narrowing and mild spinal canal stenosis.  Dr. Michale Age of neurosurgery consulted and recommended admission.  Patient had been given Tylenol  and fentanyl  IV for pain.  Review of Systems: As mentioned in the history of present illness. All other systems reviewed and are negative. Past Medical History:  Diagnosis Date   A-fib [facility])    Aortic stenosis    s/p AVR 07/2005(porcine)   Carotid bruit 09/2008   <50% B   GERD (gastroesophageal reflux disease)    Gilbert's syndrome    Hyperlipidemia    Hypertension    IFG (impaired fasting glucose)    OAB (overactive bladder)    Osteopenia    Phlebitis    UPPER EXTREMITY   PVC's (premature ventricular contractions)    Shingles    Varicose veins    s/p treatment    Vertigo    Past Surgical History:  Procedure Laterality Date   ABDOMINAL HYSTERECTOMY  1963   partial   AORTIC VALVE REPLACEMENT  08/01/2005   WITH A #23MM TORONTO  STENTLESS PORCINE AORTIC VALVE   CARDIAC CATHETERIZATION  07/25/2005   EF 60% THE AORTIC VALVE IS HEAVILY CALCIFIED WITH REDUCED OPENING. NO MVP   FOOT SURGERY     HAND SURGERY     HEMORRHOID SURGERY  1980's   ORIF ANKLE FRACTURE Right 09/11/2020   Procedure: OPEN REDUCTION INTERNAL FIXATION (ORIF) ANKLE FRACTURE;  Surgeon: Micheline Ahr, MD;  Location: [facility] OR;  Service: Orthopedics;  Laterality: Right;   US  ECHOCARDIOGRAPHY  04/14/2008   EF 55-60%. NORMAL   US  ECHOCARDIOGRAPHY   11/21/2005   EF 55-60%   US  ECHOCARDIOGRAPHY  07/20/2005   EF 55-60%   US  ECHOCARDIOGRAPHY  10/19/2004   EF 55-60%   VARICOSE VEIN SURGERY  2010   laser treatment   Social History:  reports that she has never smoked. She has never used smokeless tobacco. She reports that she does not drink alcohol  and does not use drugs.  Allergies  Allergen Reactions   Synthroid [Levothyroxine Sodium]     Pt stated not sure what happens when she takes this   Tramadol Hcl Other (See Comments)    Family History  Problem Relation Age of Onset   Pneumonia Mother 61   Alzheimer's disease Father    Leukemia Brother    Alcohol  abuse Brother     Prior to Admission medications   Medication Sig Start Date End Date Taking? Authorizing Provider  amLODipine  (NORVASC ) 10 MG tablet Take 10 mg by mouth daily. 03/19/19   [provider]  aspirin  81 MG tablet Take 81 mg by mouth daily.      [provider]  bisacodyl  (DULCOLAX) 10 MG suppository Place 1 suppository (10 mg total) rectally daily as needed for moderate constipation. 09/16/20   Kraig Peru, MD  donepezil  (ARICEPT ) 10 MG tablet Take 10 mg by mouth at bedtime.  01/11/15   [provider]  enoxaparin  (LOVENOX ) 40 MG/0.4ML injection Inject 0.4 mLs (40 mg total) into the skin daily. 09/14/20 10/14/20  McBane, Caroline N, PA-C  hydrochlorothiazide  (HYDRODIURIL ) 25 MG tablet Take 25 mg by mouth daily. 03/19/19   [provider]  HYDROcodone -acetaminophen  (NORCO) 5-325 MG tablet Take 1-2 tablets by mouth every 6 (six) hours as needed for severe pain. 09/14/20   McBane, Caroline N, PA-C  levothyroxine (SYNTHROID) 50 MCG tablet Take 50 mcg by mouth every morning. 09/18/22   [provider]  methocarbamol (ROBAXIN) 500 MG tablet Take 1 tablet by mouth as needed. For spasms/muscle tension 05/12/19   [provider]  olmesartan (BENICAR) 40 MG tablet Take 40 mg by mouth daily.    [provider]  psyllium  (HYDROCIL/METAMUCIL) 95 % PACK Take 1 packet by mouth daily. 09/16/20   Kraig Peru, MD  Vitamin D , Ergocalciferol , (DRISDOL ) 50000 units CAPS capsule Take 50,000 Units by mouth every 7 (seven) days. On Sundays    [provider]    Physical Exam: Vitals:   06/10/23 1730 06/10/23 1930 06/11/23 0645 06/11/23 0800  BP: (!) 163/41 (!) 165/53 (!) 201/58 (!) 186/81  Pulse: 72 70 68 64  Resp:  16    Temp:      TempSrc:      SpO2: 99% 100% 96% 96%  Weight:      Height:       Constitutional: Elderly female currently in no acute distress. Eyes: PERRL, lids and conjunctivae normal ENMT: Mucous membranes are moist.  Normal dentition.  Hard of hearing. Neck: normal, supple Respiratory: clear to auscultation  bilaterally, no wheezing, no crackles. Normal respiratory effort. No accessory muscle use.  Cardiovascular: Regular rate and rhythm, no murmurs / rubs / gallops. No extremity edema. 2+ pedal pulses.   Abdomen: no tenderness, no masses palpated.  Bowel sounds positive.  Musculoskeletal: no clubbing / cyanosis. No joint deformity upper and lower extremities. Good ROM, no contractures. Normal muscle tone.  Skin: Bruising and abrasion to the inner right foot Neurologic: CN 2-12 grossly intact.  Strength 3/5 in the right lower extremity. Psychiatric: Normal judgment and insight. Alert and oriented x 3. Normal mood.   Data Reviewed:  Reviewed labs, imaging, and pertinent records as documented.  Assessment and Plan:  Lumbar disc herniation Acute.  Patient presents with complaints of right leg weakness prior to having possible fall. - Admit to a medical telemetry bed - Monitor intake and output - Oxycodone /fentanyl  IV as needed for moderate to severe pain respectively - Physical therapy to eval and treat - Neurosurgery consulted we will follow-up for any further recommendations  Leukocytosis Acute.  WBC elevated at 15.3.  Possibly reactive to above. -Check urinalysis - Recheck  WBC in a.m.  Essential hypertension Initial blood pressures elevated up to 201/58. - Continue home blood pressure regimen  Hypothyroidism - Continue levothyroxine  Thrombocytosis Acute.  Platelet count elevated at 403.  Suspect reactive to above. - Recheck platelet count in a.m.  Memory impairment - Continue donepezil   S/p aortic valve replacement  Chronic kidney disease stage III Creatinine 1.11 - Continue to monitor DVT prophylaxis: Lovenox  Advance Care Planning:   Code Status: Do not attempt resuscitation (DNR) PRE-ARREST INTERVENTIONS DESIRED   Consults: Neurosurgery Family Communication: Family updated at bedside   Severity of Illness: The appropriate patient status for this patient is INPATIENT. Inpatient status is judged to be reasonable and necessary in order to provide the required intensity of service to ensure the patient's safety. The patient's presenting symptoms, physical exam findings, and initial radiographic and laboratory data in the context of their chronic comorbidities is felt to place them at high risk for further clinical deterioration. Furthermore, it is not anticipated that the patient will be medically stable for discharge from the hospital within 2 midnights of admission.   * I certify that at the point of admission it is my clinical judgment that the patient will require inpatient hospital care spanning beyond 2 midnights from the point of admission due to high intensity of service, high risk for further deterioration and high frequency of surveillance required.*  Author: Lena Qualia, MD 06/11/2023 9:01 AM  For on call review [email or web address].

## 2023-06-11 NOTE — ED Notes (Signed)
 Pt was set up for bed bath and given toothbrush and toothpaste

## 2023-06-11 NOTE — Evaluation (Addendum)
 Physical Therapy Evaluation Patient Details Name: Megan Pitts MRN: [id number] DOB: 06-Apr-1927 Today's Date: 06/11/2023  History of Present Illness  Pt is a 89 y/o F admitted on 06/10/23 after presenting with c/o RLE giving out & causing her to fall. MRI shows disc eccentric to the right at L2/3. PMH: a-fib, aortic stenosis, GERD, HLD, HTN, shingles, vertigo  Clinical Impression  Pt seen for PT evaluation with pt agreeable to tx, daughter present for session. Prior to admission pt was mod I with rollator, living alone without any assistance needed (except she has someone clean her house for her). On this date, pt presents with decreased sensation to light touch in RLE, decreased strength & pain in RLE. Pt is able to complete bed mobility via log rolling with min assist with cuing re: technique. Pt is able to transfer sit<>stand from EOB but unable to take steps to R as pt unable to weight bear through RLE to allow stepping with LLE; pt initially reports this is 2/2 pain but then later states it's 2/2 weakness. Pt does transfer bed>drop arm recliner via lateral scoot with min assist, reporting she feels better sitting up in a chair. Will continue to follow pt acutely to progress mobility as able. At this time, pt is unsafe to d/c home alone & would benefit from post acute rehab <3 hours therapy/day upon d/c to maximize independence & reduce fall risk with mobility.        If plan is discharge home, recommend the following: A lot of help with walking and/or transfers;A lot of help with bathing/dressing/bathroom;Assist for transportation;Assistance with cooking/housework;Help with stairs or ramp for entrance   Can travel by private vehicle   No    Equipment Recommendations Other (comment) (defer to next venue)  Recommendations for Other Services    OT consult   Functional Status Assessment Patient has had a recent decline in their functional status and demonstrates the ability to make significant  improvements in function in a reasonable and predictable amount of time.     Precautions / Restrictions Precautions Precautions: Fall;Back Restrictions Weight Bearing Restrictions Per Provider Order: No      Mobility  Bed Mobility Overal bed mobility: Needs Assistance Bed Mobility: Rolling, Sidelying to Sit Rolling: Contact guard assist, Used rails (HOB elevated) Sidelying to sit: Min assist, HOB elevated, Used rails       General bed mobility comments: multimodal cuing re: log rolling technique    Transfers Overall transfer level: Needs assistance Equipment used: Rolling walker (2 wheels) Transfers: Sit to/from Stand, Bed to chair/wheelchair/BSC Sit to Stand: Min assist   Step pivot transfers:  (unable, pt able to lift RLE to step but unable to weight shift to RLE to step with LLE, RLE buckles when pt attempts; pt initially reports this is 2/2 pain but then reports it's 2/2 weakness)      Lateral/Scoot Transfers: Contact guard assist (bed>drop arm recliner on R) General transfer comment: sit<>stand from EOB with RW with cuing/education but poor return demo re: hand placement during sit<>stand & pt pulls to stand with BUE on RW, assistance to power up to standing.    Ambulation/Gait                  Stairs            Wheelchair Mobility     Tilt Bed    Modified Rankin (Stroke Patients Only)       Balance Overall balance assessment: Needs assistance Sitting-balance support:  Feet supported Sitting balance-Leahy Scale: Fair Sitting balance - Comments: supervision static sitting   Standing balance support: Bilateral upper extremity supported, Reliant on assistive device for balance, During functional activity Standing balance-Leahy Scale: Poor                               Pertinent Vitals/Pain Pain Assessment Pain Assessment: 0-10 Pain Score: 10-Worst pain ever Pain Location: R lower back, RLE Pain Descriptors / Indicators:  Burning, Discomfort, Grimacing, Guarding Pain Intervention(s): Monitored during session, Premedicated before session, Limited activity within patient's tolerance    Home Living Family/patient expects to be discharged to:: Private residence Living Arrangements: Alone Available Help at Discharge: Family;Available PRN/intermittently Type of Home:  (condo) Home Access: Stairs to enter   [employer] of Steps: 1 step onto porch     Home Equipment: Educational psychologist (4 wheels);Transport chair      Prior Function               Mobility Comments: Only the 1 fall prior to admission in the past 6 months ADLs Comments: Independent with bathing & dressing, someone cleans her home for her, she preps simple meals/microwaveable meals. Enjoys reading, watching TV, sitting outside & talks to neighbors.     Extremity/Trunk Assessment   Upper Extremity Assessment Upper Extremity Assessment: Generalized weakness    Lower Extremity Assessment Lower Extremity Assessment: Generalized weakness;RLE deficits/detail RLE Deficits / Details: decreased sensation to light touch RLE: Unable to fully assess due to pain       Communication   Communication Communication: Impaired Factors Affecting Communication: Hearing impaired    Cognition Arousal: Alert Behavior During Therapy: WFL for tasks assessed/performed   PT - Cognitive impairments: No apparent impairments                           Following commands impaired: Follows one step commands with increased time     Cueing Cueing Techniques: Verbal cues, Visual cues, Gestural cues, Tactile cues     General Comments General comments (skin integrity, edema, etc.): Pt nauseous upon PT arrival, + emesis during session & nurse made aware.    Exercises     Assessment/Plan    PT Assessment Patient needs continued PT services  PT Problem List Decreased strength;Decreased coordination;Pain;Decreased range of  motion;Decreased activity tolerance;Decreased balance;Decreased mobility;Decreased safety awareness;Decreased knowledge of use of DME;Decreased knowledge of precautions;Impaired sensation       PT Treatment Interventions DME instruction;Balance training;Modalities;Neuromuscular re-education;Stair training;Gait training;Functional mobility training;Therapeutic exercise;Therapeutic activities;Patient/family education    PT Goals (Current goals can be found in the Care Plan section)  Acute Rehab PT Goals Patient Stated Goal: decreased pain, get better, go home PT Goal Formulation: With patient/family Time For Goal Achievement: 06/25/23 Potential to Achieve Goals: Fair    Frequency Min 2X/week     Co-evaluation               AM-PAC PT "6 Clicks" Mobility  Outcome Measure Help needed turning from your back to your side while in a flat bed without using bedrails?: A Little Help needed moving from lying on your back to sitting on the side of a flat bed without using bedrails?: A Lot Help needed moving to and from a bed to a chair (including a wheelchair)?: A Lot Help needed standing up from a chair using your arms (e.g., wheelchair or bedside chair)?: A Lot Help needed to  walk in hospital room?: Total Help needed climbing 3-5 steps with a railing? : Total 6 Click Score: 11    End of Session   Activity Tolerance: Patient tolerated treatment well;Patient limited by pain (limited 2/2 nausea) Patient left: in chair;with call bell/phone within reach;with family/visitor present Nurse Communication: Mobility status PT Visit Diagnosis: Difficulty in walking, not elsewhere classified (R26.2);Pain;Muscle weakness (generalized) (M62.81);Other abnormalities of gait and mobility (R26.89) Pain - Right/Left: Right Pain - part of body: Leg;Hip    Time: 0272-5366 PT Time Calculation (min) (ACUTE ONLY): 24 min   Charges:   PT Evaluation $PT Eval Moderate Complexity: 1 Mod   PT General  Charges $$ ACUTE PT VISIT: 1 Visit         Emaline Handsome, PT, DPT 06/11/23, 2:04 PM   Venetta Gill 06/11/2023, 2:01 PM

## 2023-06-11 NOTE — ED Notes (Signed)
 Changed pt gown and introduced myself to pt.  Pt is hypertensive but not symptomatic.  EDP notified of BP

## 2023-06-11 NOTE — Progress Notes (Signed)
 Patient ID: Megan Pitts, female   DOB: 1927/12/04, 89 y.o.   MRN: [id number] BP (!) 188/103 (BP Location: Left Arm)   Pulse 70   Temp 97.8 F (36.6 C) (Oral)   Resp 16   Ht 5\' 3"  (1.6 m)   Wt 78.6 kg   SpO2 97%   BMI 30.70 kg/m  Scheduled discetomy for Wednesday. Spoke to daughter and patient. Moving all extremities More confused today, follows commands

## 2023-06-12 DIAGNOSIS — M5126 Other intervertebral disc displacement, lumbar region: Secondary | ICD-10-CM | POA: Diagnosis not present

## 2023-06-12 LAB — CBC
HCT: 45.3 % (ref 36.0–46.0)
Hemoglobin: 15.1 g/dL — ABNORMAL HIGH (ref 12.0–15.0)
MCH: 28.4 pg (ref 26.0–34.0)
MCHC: 33.3 g/dL (ref 30.0–36.0)
MCV: 85.3 fL (ref 80.0–100.0)
Platelets: 378 10*3/uL (ref 150–400)
RBC: 5.31 MIL/uL — ABNORMAL HIGH (ref 3.87–5.11)
RDW: 13.2 % (ref 11.5–15.5)
WBC: 12.1 10*3/uL — ABNORMAL HIGH (ref 4.0–10.5)
nRBC: 0 % (ref 0.0–0.2)

## 2023-06-12 LAB — BASIC METABOLIC PANEL WITH GFR
Anion gap: 12 (ref 5–15)
BUN: 26 mg/dL — ABNORMAL HIGH (ref 8–23)
CO2: 22 mmol/L (ref 22–32)
Calcium: 8.4 mg/dL — ABNORMAL LOW (ref 8.9–10.3)
Chloride: 93 mmol/L — ABNORMAL LOW (ref 98–111)
Creatinine, Ser: 1.55 mg/dL — ABNORMAL HIGH (ref 0.44–1.00)
GFR, Estimated: 31 mL/min — ABNORMAL LOW (ref >60)
Glucose, Bld: 190 mg/dL — ABNORMAL HIGH (ref 70–99)
Potassium: 4 mmol/L (ref 3.5–5.1)
Sodium: 127 mmol/L — ABNORMAL LOW (ref 135–145)

## 2023-06-12 LAB — SODIUM, URINE, RANDOM: Sodium, Ur: 28 mmol/L

## 2023-06-12 LAB — OSMOLALITY, URINE: Osmolality, Ur: 441 mosm/kg (ref 300–900)

## 2023-06-12 MED ORDER — SODIUM CHLORIDE 0.9% FLUSH: 3.0000 mL | INTRAVENOUS | Status: DC | PRN

## 2023-06-12 MED ORDER — SODIUM CHLORIDE 0.9% FLUSH
3.0000 mL | Freq: Two times a day (BID) | INTRAVENOUS | Status: DC
  Administered 2023-06-12 – 2023-06-14 (×3): 3 mL via INTRAVENOUS
  Administered 2023-06-15: 10 mL via INTRAVENOUS
  Administered 2023-06-16: 3 mL via INTRAVENOUS

## 2023-06-12 MED ORDER — SODIUM CHLORIDE 0.9 % IV SOLN: INTRAVENOUS | Status: DC

## 2023-06-12 NOTE — Hospital Course (Addendum)
 89 y.o. female with medical history significant of hypertension, hyperlipidemia, atrial fibrillation, aortic stenosis s/p aortic valve replacement, hypothyroidism, presented after having a fall  and w/ right leg weakness and inability to stand. She describes the right leg as feeling weak, as if 'there was nothing in there.' She is unable to lift the leg or bear weight on it. There is constant pain in the leg, which worsens when attempting to raise it. These symptoms are new and have not been experienced before.Three weeks prior, she had an injury to her left knee, described as a sprain, which has since resolved. Following this, she began experiencing pain in her hip, which has persisted. In the ED-afebrile blood pressure was elevated up to 201/58,- Labs from 6/1 significant for WBC 15.3, hemoglobin 15.4, platelets 403, sodium 133, BUN 23, and creatinine 1.11. X-rays of the right femur and lumbar spine >> did not reveal any acute normality and degenerative spondylosis. MRI of the lumbar spine>> significant for severe left facet arthropathy at L5-S1 with a 6 mm anterior protruding synovial cyst compressing on the exiting left L4 V nerve root, right central disc protrusion at L2-3 compressing on the transverse right L3 nerve root in the subarticular zone with moderate right neural foraminal narrowing, disc bulge at L3-4 with severe right and moderate left neuroforaminal narrowing and mild spinal canal stenosis. Dr. Michale Age of neurosurgery consulted and recommended admission   Subjective: Seen examined Resting well No pain Rt leg is weak Overnight afebrile BP 130s-170s systolic, on room air Hx of labs ordered previously leukocytosis and hyponatremia, hemoconcentration Hb 15  Assessment and plan:  Acute Lumbar disc herniation RLE weakness prior to having possible fall: Neurosurgery is following, on pain management with lidocaine , oxycodone  and fentanyl  iv prn. planning for operative  intervention-dissectomy on Wednesday. Cont PTOT, fall precaution  AKI on CKD IIIa Hypovolemic hyponatremia Creatinine 1.11 on admit -previous baseline up to 1.18, now tredning up 1.55- avoid nsaid, add ivf, avoid hypotension Monitor UOP and bmp. Sodium low as well- check urine lytes, cont ivf. Hold hydrochlorothiazide , AVAPRO  Recent Labs  Lab 06/10/23 1220 06/12/23 0846  BUN 23 26*  CREATININE 1.11* 1.55*     Leukocytosis UA with WBC 6-10.  No fever.  Labs improving   Essential hypertension HLD: Poorly controlled  on admit- now stable. Continue home amlodipine  5. Hold hydrochlorothiazide , AVAPRO  due ot aki- can up amlodipine  if needed . Not on a statin given her age   Hypothyroidism Continue levothyroxine   Thrombocytosis Likely in the setting of 1.  Monitor    Memory impairment Continue donepezil    AS S/p aortic valve replacement 2007 w/ post op A fib hx: Monitoring telemetry

## 2023-06-12 NOTE — Progress Notes (Signed)
 OT Cancellation Note  Patient Details Name: Megan Pitts MRN: [id number] DOB: June 26, 1927   Cancelled Treatment:    Reason Eval/Treat Not Completed: Other (comment)- noted plan for surgery Wed 6/4. Will follow up post op.   Bary Boss, OT Acute Rehabilitation Services Office [phone] Secure Chat Preferred    Fredrich Jefferson 06/12/2023, 1:05 PM

## 2023-06-12 NOTE — [place] FL2 (Signed)
 [place]  MEDICAID FL2 LEVEL OF CARE FORM     IDENTIFICATION  Patient Name: Megan Pitts Birthdate: 1927-09-28 Sex: female Admission Date (Current Location): 06/10/2023  [facility] and [place] Number:  Producer, television/film/video and Address:  [facility] [place]. [facility], [address], [place], [place] [address]      Provider Number: [id number]  Attending Physician Name and Address:  Lesa Rape, MD  Relative Name and Phone Number:       Current Level of Care: Hospital Recommended Level of Care: Skilled Nursing Facility Prior Approval Number:    Date Approved/Denied:   PASRR Number: [id number]  Discharge Plan: SNF    Current Diagnoses: Patient Active Problem List   Diagnosis Date Noted   Lumbar disc herniation 06/11/2023   Hypothyroidism 06/11/2023   Thrombocytosis 06/11/2023   Memory impairment 06/11/2023   CKD (chronic kidney disease), stage III (HCC) 06/11/2023   Closed right ankle fracture 09/10/2020   CKD (chronic kidney disease), stage II 09/10/2020   Cervical spine fracture (HCC) 06/03/2019   Syncope, vasovagal 09/27/2017   Syncope and collapse 09/26/2017   Fracture dislocation of right shoulder joint 09/24/2017   Shoulder fracture, right 09/24/2017   Chronic diastolic CHF (congestive heart failure) (HCC) 09/24/2017   Leucocytosis 09/24/2017   Cellulitis 05/05/2015   S/P AVR 10/26/2010   Aortic stenosis    Essential hypertension    PVC's (premature ventricular contractions)    Hyperlipidemia     Orientation RESPIRATION BLADDER Height & Weight     Self, Time, Situation, Place  Normal Continent, External catheter Weight: 173 lb 4.8 oz (78.6 kg) Height:  5\' 3"  (160 cm)  BEHAVIORAL SYMPTOMS/MOOD NEUROLOGICAL BOWEL NUTRITION STATUS      Continent Diet (Regular)  AMBULATORY STATUS COMMUNICATION OF NEEDS Skin   Extensive Assist Verbally Normal                       Personal Care Assistance Level of Assistance  Bathing, Feeding, Dressing Bathing  Assistance: Limited assistance Feeding assistance: Limited assistance Dressing Assistance: Limited assistance     Functional Limitations Info  Sight, Hearing, Speech Sight Info: Impaired (Glasses) Hearing Info: Adequate Speech Info: Adequate    SPECIAL CARE FACTORS FREQUENCY  PT (By licensed PT), OT (By licensed OT)     PT Frequency: 5x weekly OT Frequency: 5x weekly            Contractures Contractures Info: Not present    Additional Factors Info  Code Status, Allergies Code Status Info: Full Code Allergies Info: Synthroid (Levothyroxine Sodium)  Ultram (Tramadol Hcl)           Current Medications (06/12/2023):  This is the current hospital active medication list Current Facility-Administered Medications  Medication Dose Route Frequency Provider Last Rate Last Admin   acetaminophen  (TYLENOL ) tablet 650 mg  650 mg Oral Q6H PRN Smith, Rondell A, MD       Or   acetaminophen  (TYLENOL ) suppository 650 mg  650 mg Rectal Q6H PRN Felipe Horton, Rondell A, MD       albuterol (PROVENTIL) (2.5 MG/3ML) 0.083% nebulizer solution 2.5 mg  2.5 mg Nebulization Q6H PRN Smith, Rondell A, MD       amLODipine  (NORVASC ) tablet 5 mg  5 mg Oral Daily Carin Charleston, MD   5 mg at 06/11/23 7846   diclofenac Sodium (VOLTAREN) 1 % topical gel 2 g  2 g Topical Once Tee, Andrew R, MD  donepezil  (ARICEPT ) tablet 10 mg  10 mg Oral QHS Carin Charleston, MD   10 mg at 06/11/23 2014   enoxaparin  (LOVENOX ) injection 30 mg  30 mg Subcutaneous Q24H Smith, Rondell A, MD   30 mg at 06/11/23 1053   fentaNYL  (SUBLIMAZE ) injection 25 mcg  25 mcg Intravenous Q2H PRN Smith, Rondell A, MD   25 mcg at 06/11/23 2014   hydrALAZINE  (APRESOLINE ) injection 10 mg  10 mg Intravenous Q4H PRN Smith, Rondell A, MD   10 mg at 06/11/23 1257   hydrochlorothiazide  (HYDRODIURIL ) tablet 25 mg  25 mg Oral Daily Carin Charleston, MD   25 mg at 06/11/23 8295   irbesartan  (AVAPRO ) tablet 300 mg  300 mg Oral Daily Carin Charleston, MD   300 mg at  06/11/23 0908   levothyroxine (SYNTHROID) tablet 50 mcg  50 mcg Oral Q0600 Carin Charleston, MD   50 mcg at 06/12/23 0456   lidocaine  (LIDODERM ) 5 % 1 patch  1 patch Transdermal Q24H Carin Charleston, MD   1 patch at 06/11/23 6213   lidocaine  (LIDODERM ) 5 % 1 patch  1 patch Transdermal Q24H Carin Charleston, MD   1 patch at 06/11/23 1526   ondansetron  (ZOFRAN ) tablet 4 mg  4 mg Oral Q6H PRN Smith, Rondell A, MD       Or   ondansetron  (ZOFRAN ) injection 4 mg  4 mg Intravenous Q6H PRN Smith, Rondell A, MD   4 mg at 06/11/23 2014   oxyCODONE -acetaminophen  (PERCOCET/ROXICET) 5-325 MG per tablet 1 tablet  1 tablet Oral Q6H PRN Carin Charleston, MD   1 tablet at 06/12/23 0456   promethazine (PHENERGAN) 12.5 mg in sodium chloride  0.9 % 50 mL IVPB  12.5 mg Intravenous Q6H PRN Smith, Rondell A, MD   Stopped at 06/11/23 1557   sodium chloride  flush (NS) 0.9 % injection 3 mL  3 mL Intravenous Q12H Manny Sees A, MD   3 mL at 06/11/23 2017     Discharge Medications: Please see discharge summary for a list of discharge medications.  Relevant Imaging Results:  Relevant Lab Results:   Additional Information SS#: [id number]  Dexter Forest, LCSW

## 2023-06-12 NOTE — TOC Initial Note (Signed)
 Transition of Care [facility]) - Initial/Assessment Note    Patient Details  Name: Megan Pitts MRN: [id number] Date of Birth: 1927-11-23  Transition of [facility]) CM/SW Contact:    Elspeth Hals, LCSW Phone Number: 06/12/2023, 3:36 PM  Clinical Narrative:   CSW spoke with pt for initial assessment.  Pt from home alone, no current services.  Permission given to speak with daughter Russ Course.  Pt confirms that she is planning on SNF at DC, also confirms OR tomorrow. Requesting [facility].  Referral has been sent out for SNF, CSW reached out to [facility].   TOC will continue to follow for DC needs.               Expected Discharge Plan: Skilled Nursing Facility Barriers to Discharge: Continued Medical Work up, SNF Pending bed offer   Patient Goals and CMS Choice Patient states their goals for this hospitalization and ongoing recovery are:: back to normal activities: walking socializing   Choice offered to / list presented to : Patient (pt requesting [facility])      Expected Discharge Plan and Services In-house Referral: Clinical Social Work   Post Acute Care Choice: Skilled Nursing Facility Living arrangements for the past 2 months: Single Family Home                                      Prior Living Arrangements/Services Living arrangements for the past 2 months: Single Family Home Lives with:: Self Patient language and need for interpreter reviewed:: Yes Do you feel safe going back to the place where you live?: Yes      Need for Family Participation in Patient Care: Yes (Comment) Care giver support system in place?: Yes (comment) Current home services: Other (comment) (none) Criminal Activity/Legal Involvement Pertinent to Current Situation/Hospitalization: No - Comment as needed  Activities of Daily Living   ADL Screening (condition at time of admission) Independently performs ADLs?: Yes (appropriate for developmental age) Is the patient deaf or have  difficulty hearing?: Yes Does the patient have difficulty seeing, even when wearing glasses/contacts?: No Does the patient have difficulty concentrating, remembering, or making decisions?: No  Permission Sought/Granted Permission sought to share information with : Family Supports Permission granted to share information with : Yes, Verbal Permission Granted  Share Information with NAME: daughter Russ Course  Permission granted to share info w AGENCY: SNF        Emotional Assessment Appearance:: Appears stated age Attitude/Demeanor/Rapport: Engaged Affect (typically observed): Appropriate, Pleasant Orientation: : Oriented to Self, Oriented to Place, Oriented to  Time, Oriented to Situation      Admission diagnosis:  Lumbar disc herniation [M51.26] Patient Active Problem List   Diagnosis Date Noted   Lumbar disc herniation 06/11/2023   Hypothyroidism 06/11/2023   Thrombocytosis 06/11/2023   Memory impairment 06/11/2023   CKD (chronic kidney disease), stage III (HCC) 06/11/2023   Closed right ankle fracture 09/10/2020   CKD (chronic kidney disease), stage II 09/10/2020   Cervical spine fracture (HCC) 06/03/2019   Syncope, vasovagal 09/27/2017   Syncope and collapse 09/26/2017   Fracture dislocation of right shoulder joint 09/24/2017   Shoulder fracture, right 09/24/2017   Chronic diastolic CHF (congestive heart failure) (HCC) 09/24/2017   Leucocytosis 09/24/2017   Cellulitis 05/05/2015   S/P AVR 10/26/2010   Aortic stenosis    Essential hypertension    PVC's (premature ventricular contractions)    Hyperlipidemia  PCP:  Jeannine Milroy., MD Pharmacy:   [facility] [place], [place] - [address] [place] [address] Phone: [phone] Fax: [phone]     Social Drivers of Health (SDOH) Social History: SDOH Screenings   Food Insecurity: No Food Insecurity (06/11/2023)  Housing: Low Risk  (06/11/2023)   Transportation Needs: No Transportation Needs (06/11/2023)  Utilities: Not At Risk (06/11/2023)  Alcohol  Screen: Low Risk  (06/30/2019)  Depression (PHQ2-9): Low Risk  (12/08/2020)  Financial Resource Strain: Low Risk  (06/30/2019)  Physical Activity: Inactive (06/30/2019)  Social Connections: Moderately Isolated (06/11/2023)  Stress: No Stress Concern Present (06/30/2019)  Tobacco Use: Low Risk  (06/10/2023)   SDOH Interventions:     Readmission Risk Interventions     No data to display

## 2023-06-12 NOTE — Progress Notes (Signed)
 PROGRESS NOTE SHIRLINE KENDLE  ZOX:[id number] DOB: 12/28/1927 DOA: 06/10/2023 PCP: Jeannine Milroy., MD  Brief Narrative/Hospital Course: 89 y.o. female with medical history significant of hypertension, hyperlipidemia, atrial fibrillation, aortic stenosis s/p aortic valve replacement, hypothyroidism, presented after having a fall  and w/ right leg weakness and inability to stand. She describes the right leg as feeling weak, as if 'there was nothing in there.' She is unable to lift the leg or bear weight on it. There is constant pain in the leg, which worsens when attempting to raise it. These symptoms are new and have not been experienced before.Three weeks prior, she had an injury to her left knee, described as a sprain, which has since resolved. Following this, she began experiencing pain in her hip, which has persisted. In the ED-afebrile blood pressure was elevated up to 201/58,- Labs from 6/1 significant for WBC 15.3, hemoglobin 15.4, platelets 403, sodium 133, BUN 23, and creatinine 1.11. X-rays of the right femur and lumbar spine >> did not reveal any acute normality and degenerative spondylosis. MRI of the lumbar spine>> significant for severe left facet arthropathy at L5-S1 with a 6 mm anterior protruding synovial cyst compressing on the exiting left L4 V nerve root, right central disc protrusion at L2-3 compressing on the transverse right L3 nerve root in the subarticular zone with moderate right neural foraminal narrowing, disc bulge at L3-4 with severe right and moderate left neuroforaminal narrowing and mild spinal canal stenosis. Dr. Michale Age of neurosurgery consulted and recommended admission   Subjective: Seen examined Resting well No pain Rt leg is weak Overnight afebrile BP 130s-170s systolic, on room air Hx of labs ordered previously leukocytosis and hyponatremia, hemoconcentration Hb 15  Assessment and plan:  Acute Lumbar disc herniation RLE weakness prior to having possible  fall: Neurosurgery is following, on pain management with lidocaine , oxycodone  and fentanyl  iv prn. planning for operative intervention-dissectomy on Wednesday. Cont PTOT, fall precaution  AKI on CKD IIIa Hypovolemic hyponatremia Creatinine 1.11 on admit -previous baseline up to 1.18, now tredning up 1.55- avoid nsaid, add ivf, avoid hypotension Monitor UOP and bmp. Sodium low as well- check urine lytes, cont ivf. Hold hydrochlorothiazide , AVAPRO  Recent Labs  Lab 06/10/23 1220 06/12/23 0846  BUN 23 26*  CREATININE 1.11* 1.55*     Leukocytosis UA with WBC 6-10.  No fever.  Labs improving   Essential hypertension HLD: Poorly controlled  on admit- now stable. Continue home amlodipine  5. Hold hydrochlorothiazide , AVAPRO  due ot aki- can up amlodipine  if needed . Not on a statin given her age   Hypothyroidism Continue levothyroxine   Thrombocytosis Likely in the setting of 1.  Monitor    Memory impairment Continue donepezil    AS S/p aortic valve replacement 2007 w/ post op A fib hx: Monitoring telemetry     Class I Obesity w/ Body mass index is 30.7 kg/m.: Will benefit with PCP follow-up, weight loss,healthy lifestyle and outpatient sleep eval if not done.  DVT prophylaxis: enoxaparin  (LOVENOX ) injection 30 mg Start: 06/11/23 1000 Code Status:   Code Status: Do not attempt resuscitation (DNR) PRE-ARREST INTERVENTIONS DESIRED Family Communication: plan of care discussed with patient at bedside. Patient status is: Remains hospitalized because of severity of illness Level of care: Telemetry Medical   Dispo: The patient is from: home            Anticipated disposition: TBD Objective: Vitals last 24 hrs: Vitals:   06/11/23 1217 06/11/23 1946 06/12/23 0439 06/12/23 0840  BP: Aaron Aas)  188/103 (!) 138/59 (!) 179/77 138/79  Pulse: 70 74 77 77  Resp: 16 18 18 15   Temp: 97.8 F (36.6 C) 98.4 F (36.9 C) 98.2 F (36.8 C) 98.2 F (36.8 C)  TempSrc: Oral Oral Oral   SpO2: 97%  95% 95% 96%  Weight: 78.6 kg     Height: 5\' 3"  (1.6 m)      Physical Examination: General exam: alert awake, older than stated age HEENT:Oral mucosa moist, Ear/Nose WNL grossly Respiratory system: Bilaterally clear BS, no use of accessory muscle Cardiovascular system: S1 & S2 +. Gastrointestinal system: Abdomen soft,  NT,ND,BS+ Nervous System: Alert, awake, RLE weak LLE intact strength, following commands. Extremities: LE edema neg, warm extremities Skin: No rashes,warm. MSK: Normal muscle bulk/tone.   Data Reviewed: I have personally reviewed following labs and imaging studies ( see epic result tab) CBC: Recent Labs  Lab 06/10/23 1220 06/12/23 0846  WBC 15.3* 12.1*  HGB 15.4* 15.1*  HCT 44.8 45.3  MCV 83.7 85.3  PLT 403* 378   CMP: Recent Labs  Lab 06/10/23 1220 06/12/23 0846  NA 133* 127*  K 4.0 4.0  CL 100 93*  CO2 22 22  GLUCOSE 158* 190*  BUN 23 26*  CREATININE 1.11* 1.55*  CALCIUM  9.3 8.4*   GFR: Estimated Creatinine Clearance: 22.5 mL/min (A) (by C-G formula based on SCr of 1.55 mg/dL (H)). No results for input(s): "AST", "ALT", "ALKPHOS", "BILITOT", "PROT", "ALBUMIN" in the last 168 hours. No results for input(s): "LIPASE", "AMYLASE" in the last 168 hours. No results for input(s): "AMMONIA" in the last 168 hours. Coagulation Profile: No results for input(s): "INR", "PROTIME" in the last 168 hours. Unresulted Labs (From admission, onward)     Start     Ordered   06/13/23 0500  Basic metabolic panel with GFR  Daily,   R      06/12/23 1020   06/13/23 0500  CBC  Daily,   R      06/12/23 1020   06/12/23 1022  Osmolality, urine  Once,   R        06/12/23 1021   06/12/23 1022  Sodium, urine, random  Once,   R        06/12/23 1021           Antimicrobials/Microbiology: Anti-infectives (From admission, onward)    None         Component Value Date/Time   SDES BLOOD RIGHT WRIST 05/05/2015 2035   SPECREQUEST BOTTLES DRAWN AEROBIC AND ANAEROBIC 5CC  05/05/2015 2035   CULT NO GROWTH 5 DAYS 05/05/2015 2035   REPTSTATUS 05/10/2015 FINAL 05/05/2015 2035    Procedures: Procedure(s) (LRB): LUMBAR LAMINECTOMY/DECOMPRESSION MICRODISCECTOMY 1 LEVEL (N/A) Medications reviewed:  Scheduled Meds:  amLODipine   5 mg Oral Daily   diclofenac Sodium  2 g Topical Once   donepezil   10 mg Oral QHS   enoxaparin  (LOVENOX ) injection  30 mg Subcutaneous Q24H   levothyroxine  50 mcg Oral Q0600   lidocaine   1 patch Transdermal Q24H   lidocaine   1 patch Transdermal Q24H   sodium chloride  flush  3 mL Intravenous Q12H   sodium chloride  flush  3-10 mL Intravenous Q12H   Continuous Infusions:  sodium chloride      promethazine (PHENERGAN) injection (IM or IVPB) Stopped (06/11/23 1557)   Lesa Rape, MD [facility] 06/12/2023, 10:55 AM

## 2023-06-13 ENCOUNTER — Encounter (HOSPITAL_COMMUNITY)
Admission: EM | Disposition: A | Discharge status: Skilled Nursing Facility | Payer: Self-pay | Source: Home / Self Care | Attending: Internal Medicine

## 2023-06-13 ENCOUNTER — Other Ambulatory Visit: Payer: Self-pay | Admitting: Neurosurgery

## 2023-06-13 DIAGNOSIS — M5126 Other intervertebral disc displacement, lumbar region: Secondary | ICD-10-CM | POA: Diagnosis not present

## 2023-06-13 LAB — CBC
HCT: 37.9 % (ref 36.0–46.0)
Hemoglobin: 13 g/dL (ref 12.0–15.0)
MCH: 29.1 pg (ref 26.0–34.0)
MCHC: 34.3 g/dL (ref 30.0–36.0)
MCV: 84.8 fL (ref 80.0–100.0)
Platelets: 325 10*3/uL (ref 150–400)
RBC: 4.47 MIL/uL (ref 3.87–5.11)
RDW: 13.2 % (ref 11.5–15.5)
WBC: 16.4 10*3/uL — ABNORMAL HIGH (ref 4.0–10.5)
nRBC: 0 % (ref 0.0–0.2)

## 2023-06-13 LAB — BASIC METABOLIC PANEL WITH GFR
Anion gap: 9 (ref 5–15)
BUN: 32 mg/dL — ABNORMAL HIGH (ref 8–23)
CO2: 21 mmol/L — ABNORMAL LOW (ref 22–32)
Calcium: 7.6 mg/dL — ABNORMAL LOW (ref 8.9–10.3)
Chloride: 100 mmol/L (ref 98–111)
Creatinine, Ser: 1.67 mg/dL — ABNORMAL HIGH (ref 0.44–1.00)
GFR, Estimated: 28 mL/min — ABNORMAL LOW (ref >60)
Glucose, Bld: 107 mg/dL — ABNORMAL HIGH (ref 70–99)
Potassium: 3.7 mmol/L (ref 3.5–5.1)
Sodium: 130 mmol/L — ABNORMAL LOW (ref 135–145)

## 2023-06-13 LAB — SURGICAL PCR SCREEN
MRSA, PCR: NEGATIVE
Staphylococcus aureus: NEGATIVE

## 2023-06-13 SURGERY — LUMBAR LAMINECTOMY/DECOMPRESSION MICRODISCECTOMY 1 LEVEL
Anesthesia: General

## 2023-06-13 MED ORDER — SODIUM CHLORIDE 0.9 % IV SOLN: INTRAVENOUS | Status: DC

## 2023-06-13 MED ORDER — POLYETHYLENE GLYCOL 3350 17 G PO PACK
17.0000 g | PACK | Freq: Two times a day (BID) | ORAL | Status: AC
  Administered 2023-06-13 – 2023-06-14 (×3): 17 g via ORAL
  Filled 2023-06-13 (×3): qty 1

## 2023-06-13 NOTE — Progress Notes (Signed)
 Patient ID: Megan Pitts, female   DOB: 07/12/1927, 89 y.o.   MRN: [id number] BP (!) 144/53 (BP Location: Right Arm)   Pulse 75   Temp 98.1 F (36.7 C)   Resp 16   Ht 5\' 3"  (1.6 m)   Wt 78.6 kg   SpO2 97%   BMI 30.70 kg/m  OR on hold until kidney function improves. Explained to the family.  Moving all extremities

## 2023-06-13 NOTE — Progress Notes (Signed)
 OT Cancellation Note  Patient Details Name: Megan Pitts MRN: [id number] DOB: 1927-01-13   Cancelled Treatment:    Reason Eval/Treat Not Completed: Patient not medically ready. Pt scheduled for laminectomy. Will return post-op.   Megan Pitts, OT  Acute Rehabilitation Services Office [phone] Secure chat preferred   Megan Pitts 06/13/2023, 2:30 PM

## 2023-06-13 NOTE — Progress Notes (Signed)
 [facility] PROGRESS NOTE    Progress Note  MELANIE OPENSHAW  VWU:[id number] DOB: Feb 14, 1927 DOA: 06/10/2023 PCP: Jeannine Milroy., MD     Brief Narrative:   Megan Pitts is an 89 y.o. female past medical history of essential hypertension, Paroxysmal atrial fibrillation not on anticoagulation, history of TAVR's, hypothyroidism comes in after starting having right leg weakness and inability to stand and fall, after this she has been having constant pain worse when trying to raise the leg, imaging of the right femur and lumbar spine did not reveal any acute abnormalities.  MRI of the lumbar spine shows severe left facet arthropathy at L5-S1 and a 6 mm anterior protruding synovial cyst compressing on the left L4 nerve root, central disc protrusion with compression of L2-L3.  Neurosurgery was consulted   Assessment/Plan:   Acute Lumbar disc herniation with right leg weakness: Neurosurgery  managing her pain with lidocaine  IV fentanyl  and oxycodone . The plan for discectomy on 06/13/2023. Started on a bowel regimen MiraLAX  p.o. twice daily. Anticipate will need skilled nursing facility  Acute kidney injury on chronic kidney disease stage IIIa/hypovolemic hyponatremia: Baseline creatinine around 1 and admission 1.5. He was started on IV fluids urine output improved. Hydrochlorothiazide  and Avapro  were held. Creatinine stabilized at 1.6 continue aggressive IV fluid resuscitation. Recheck basic metabolic panel in the morning. Allow permissive hypertension.  Leucocytosis Likely reactive has remained afebrile.  Essential hypertension: Hold hydrochlorothiazide  Avapro  and amlodipine  Oral ulcer and degree of permissive hypertension in the setting of acute kidney injury.  Hypothyroidism: Continue Synthroid.  Thrombocytosis: Likely reactive.  Memory impairment: Continue donezepil.   DVT prophylaxis: lovenox  Family Communication:none Status is: Inpatient Remains inpatient  appropriate because: Lumbar disc herniation    Code Status:     Code Status Orders  (From admission, onward)           Start     Ordered   06/11/23 1351  Do not attempt resuscitation (DNR) Pre-Arrest Interventions Desired  (Code Status)  Continuous       Question Answer Comment  If pulseless and not breathing No CPR or chest compressions.   In Pre-Arrest Conditions (Patient Has Pulse and Is Breathing) May intubate, use advanced airway interventions and cardioversion/ACLS medications if appropriate or indicated. May transfer to ICU.   Consent: Discussion documented in EHR or advanced directives reviewed      06/11/23 1350           Code Status History     Date Active Date Inactive Code Status Order ID Comments User Context   06/11/2023 0918 06/11/2023 1350 Full Code [id number]  Lena Qualia, MD ED   09/10/2020 1640 09/16/2020 2003 Full Code [id number]  Raymona Caldwell, MD ED   06/03/2019 2055 06/06/2019 1921 Full Code [id number]  Davida Espy, MD Inpatient   09/24/2017 1703 09/29/2017 1637 Full Code [id number]  Leona Rake, MD ED   05/05/2015 2030 05/06/2015 1734 Full Code [id number]  Chere Cordon, MD Inpatient      Advance Directive Documentation    Flowsheet Row Most Recent Value  Type of Advance Directive Healthcare Power of Attorney, Living will, Out of facility DNR (pink MOST or yellow form)  Pre-existing out of facility DNR order (yellow form or pink MOST form) Physician notified to receive inpatient order  "MOST" Form in Place? --         IV Access:   Peripheral IV   Procedures and diagnostic studies:   No results found.   Medical  Consultants:   None.   Subjective:    Megan Pitts relates her pain is controlled.  Objective:    Vitals:   06/12/23 2049 06/13/23 0453 06/13/23 0602 06/13/23 0758  BP: (!) 145/51 (!) 130/40 134/89 (!) 144/38  Pulse: 74 76  72  Resp: 18 18  16   Temp: 98.1 F (36.7 C) 98.2 F (36.8 C)  98.2 F (36.8 C)   TempSrc:      SpO2: 94% 94%  94%  Weight:      Height:       SpO2: 94 %   Intake/Output Summary (Last 24 hours) at 06/13/2023 0905 Last data filed at 06/12/2023 2200 Gross per 24 hour  Intake 613.08 ml  Output --  Net 613.08 ml   Filed Weights   06/10/23 0610 06/11/23 1217  Weight: 73 kg 78.6 kg    Exam: General exam: In no acute distress. Respiratory system: Good air movement and clear to auscultation. Cardiovascular system: S1 & S2 heard, RRR.  Gastrointestinal system: Abdomen is nondistended, soft and nontender.  Extremities: No pedal edema. Skin: No rashes, lesions or ulcers Psychiatry: Judgement and insight appear normal. Mood & affect appropriate.    Data Reviewed:    Labs: Basic Metabolic Panel: Recent Labs  Lab 06/10/23 1220 06/12/23 0846 06/13/23 0609  NA 133* 127* 130*  K 4.0 4.0 3.7  CL 100 93* 100  CO2 22 22 21*  GLUCOSE 158* 190* 107*  BUN 23 26* 32*  CREATININE 1.11* 1.55* 1.67*  CALCIUM  9.3 8.4* 7.6*   GFR Estimated Creatinine Clearance: 20.9 mL/min (A) (by C-G formula based on SCr of 1.67 mg/dL (H)). Liver Function Tests: No results for input(s): "AST", "ALT", "ALKPHOS", "BILITOT", "PROT", "ALBUMIN" in the last 168 hours. No results for input(s): "LIPASE", "AMYLASE" in the last 168 hours. No results for input(s): "AMMONIA" in the last 168 hours. Coagulation profile No results for input(s): "INR", "PROTIME" in the last 168 hours. COVID-19 Labs  No results for input(s): "DDIMER", "FERRITIN", "LDH", "CRP" in the last 72 hours.  Lab Results  Component Value Date   SARSCOV2NAA NEGATIVE 09/16/2020   SARSCOV2NAA NEGATIVE 09/10/2020   SARSCOV2NAA NEGATIVE 06/05/2019   SARSCOV2NAA NEGATIVE 06/03/2019    CBC: Recent Labs  Lab 06/10/23 1220 06/12/23 0846 06/13/23 0609  WBC 15.3* 12.1* 16.4*  HGB 15.4* 15.1* 13.0  HCT 44.8 45.3 37.9  MCV 83.7 85.3 84.8  PLT 403* 378 325   Cardiac Enzymes: No results for input(s): "CKTOTAL", "CKMB",  "CKMBINDEX", "TROPONINI" in the last 168 hours. BNP (last 3 results) No results for input(s): "PROBNP" in the last 8760 hours. CBG: No results for input(s): "GLUCAP" in the last 168 hours. D-Dimer: No results for input(s): "DDIMER" in the last 72 hours. Hgb A1c: No results for input(s): "HGBA1C" in the last 72 hours. Lipid Profile: No results for input(s): "CHOL", "HDL", "LDLCALC", "TRIG", "CHOLHDL", "LDLDIRECT" in the last 72 hours. Thyroid  function studies: No results for input(s): "TSH", "T4TOTAL", "T3FREE", "THYROIDAB" in the last 72 hours.  Invalid input(s): "FREET3" Anemia work up: No results for input(s): "VITAMINB12", "FOLATE", "FERRITIN", "TIBC", "IRON", "RETICCTPCT" in the last 72 hours. Sepsis Labs: Recent Labs  Lab 06/10/23 1220 06/12/23 0846 06/13/23 0609  WBC 15.3* 12.1* 16.4*   Microbiology No results found for this or any previous visit (from the past 240 hours).   Medications:    amLODipine   5 mg Oral Daily   diclofenac Sodium  2 g Topical Once   donepezil   10 mg  Oral QHS   enoxaparin  (LOVENOX ) injection  30 mg Subcutaneous Q24H   levothyroxine  50 mcg Oral Q0600   lidocaine   1 patch Transdermal Q24H   sodium chloride  flush  3 mL Intravenous Q12H   sodium chloride  flush  3-10 mL Intravenous Q12H   Continuous Infusions:  sodium chloride  100 mL/hr at 06/13/23 0602   promethazine (PHENERGAN) injection (IM or IVPB) Stopped (06/11/23 1557)      LOS: 2 days   Macdonald Savoy  [facility]  06/13/2023, 9:05 AM

## 2023-06-13 NOTE — Plan of Care (Signed)
  Problem: Education: Goal: Knowledge of General Education information will improve Description: Including pain rating scale, medication(s)/side effects and non-pharmacologic comfort measures 06/13/2023 0156 by Earvin Goldberg, RN Outcome: Progressing 06/13/2023 0156 by Earvin Goldberg, RN Outcome: Progressing   Problem: Pain Managment: Goal: General experience of comfort will improve and/or be controlled 06/13/2023 0156 by Earvin Goldberg, RN Outcome: Progressing 06/13/2023 0156 by Earvin Goldberg, RN Outcome: Progressing   Problem: Safety: Goal: Ability to remain free from injury will improve 06/13/2023 0156 by Earvin Goldberg, RN Outcome: Progressing 06/13/2023 0156 by Earvin Goldberg, RN Outcome: Progressing

## 2023-06-13 NOTE — TOC Progression Note (Signed)
 Transition of Care [facility]) - Progression Note    Patient Details  Name: Megan Pitts MRN: [id number] Date of Birth: 11-Dec-1927  [facility]) CM/SW Contact  Elspeth Hals, LCSW Phone Number: 06/13/2023, 11:24 AM  Clinical Narrative:   [facility] does offer, bed available Friday.    Expected Discharge Plan: Skilled Nursing Facility Barriers to Discharge: Continued Medical Work up, SNF Pending bed offer  Expected Discharge Plan and Services In-house Referral: Clinical Social Work   Post Acute Care Choice: Skilled Nursing Facility Living arrangements for the past 2 months: Single Family Home                                       Social Determinants of Health (SDOH) Interventions SDOH Screenings   Food Insecurity: No Food Insecurity (06/11/2023)  Housing: Low Risk  (06/11/2023)  Transportation Needs: No Transportation Needs (06/11/2023)  Utilities: Not At Risk (06/11/2023)  Alcohol  Screen: Low Risk  (06/30/2019)  Depression (PHQ2-9): Low Risk  (12/08/2020)  Financial Resource Strain: Low Risk  (06/30/2019)  Physical Activity: Inactive (06/30/2019)  Social Connections: Moderately Isolated (06/11/2023)  Stress: No Stress Concern Present (06/30/2019)  Tobacco Use: Low Risk  (06/10/2023)    Readmission Risk Interventions     No data to display

## 2023-06-13 NOTE — Progress Notes (Signed)
 PT Cancellation Note  Patient Details Name: Megan Pitts MRN: [id number] DOB: [birth year]   Cancelled Treatment:    Reason Eval/Treat Not Completed: Patient not medically ready (Pt scheduled for surgery. Will follow up post-op.)   Michaelle Adolphus 06/13/2023, 2:36 PM

## 2023-06-13 NOTE — Progress Notes (Signed)
 Patient has been held NPO since Midnight.  Scheduled for surgery with no orders.

## 2023-06-14 DIAGNOSIS — M5126 Other intervertebral disc displacement, lumbar region: Secondary | ICD-10-CM | POA: Diagnosis not present

## 2023-06-14 LAB — BASIC METABOLIC PANEL WITH GFR
Anion gap: 7 (ref 5–15)
BUN: 21 mg/dL (ref 8–23)
CO2: 20 mmol/L — ABNORMAL LOW (ref 22–32)
Calcium: 7.3 mg/dL — ABNORMAL LOW (ref 8.9–10.3)
Chloride: 104 mmol/L (ref 98–111)
Creatinine, Ser: 1.21 mg/dL — ABNORMAL HIGH (ref 0.44–1.00)
GFR, Estimated: 42 mL/min — ABNORMAL LOW (ref >60)
Glucose, Bld: 106 mg/dL — ABNORMAL HIGH (ref 70–99)
Potassium: 3.8 mmol/L (ref 3.5–5.1)
Sodium: 131 mmol/L — ABNORMAL LOW (ref 135–145)

## 2023-06-14 LAB — CBC
HCT: 37.6 % (ref 36.0–46.0)
Hemoglobin: 12.8 g/dL (ref 12.0–15.0)
MCH: 28.8 pg (ref 26.0–34.0)
MCHC: 34 g/dL (ref 30.0–36.0)
MCV: 84.7 fL (ref 80.0–100.0)
Platelets: 309 10*3/uL (ref 150–400)
RBC: 4.44 MIL/uL (ref 3.87–5.11)
RDW: 13.3 % (ref 11.5–15.5)
WBC: 11.4 10*3/uL — ABNORMAL HIGH (ref 4.0–10.5)
nRBC: 0 % (ref 0.0–0.2)

## 2023-06-14 MED ORDER — SODIUM CHLORIDE 0.9 % IV SOLN: INTRAVENOUS | Status: AC

## 2023-06-14 NOTE — TOC Initial Note (Addendum)
 Transition of Care [facility]) - Initial/Assessment Note    Patient Details  Name: Megan Pitts MRN: [id number] Date of Birth: [birth year]  [facility] CM/SW Contact:    Elspeth Hals, LCSW Phone Number: 06/14/2023, 8:31 AM  Clinical Narrative:     Medicare payer with inpt order 06/11/23.             1125: CSW spoke with pt and daughter Russ Course, confirmed that they do want to accept offer at [facility].  Unclear DC date at this time.  [facility] updated.  TOC will continue to follow.  Expected Discharge Plan: Skilled Nursing Facility Barriers to Discharge: Continued Medical Work up, SNF Pending bed offer   Patient Goals and CMS Choice Patient states their goals for this hospitalization and ongoing recovery are:: back to normal activities: walking socializing   Choice offered to / list presented to : Patient (pt requesting [facility])      Expected Discharge Plan and Services In-house Referral: Clinical Social Work   Post Acute Care Choice: Skilled Nursing Facility Living arrangements for the past 2 months: Single Family Home                                      Prior Living Arrangements/Services Living arrangements for the past 2 months: Single Family Home Lives with:: Self Patient language and need for interpreter reviewed:: Yes Do you feel safe going back to the place where you live?: Yes      Need for Family Participation in Patient Care: Yes (Comment) Care giver support system in place?: Yes (comment) Current home services: Other (comment) (none) Criminal Activity/Legal Involvement Pertinent to Current Situation/Hospitalization: No - Comment as needed  Activities of Daily Living   ADL Screening (condition at time of admission) Independently performs ADLs?: Yes (appropriate for developmental age) Is the patient deaf or have difficulty hearing?: Yes Does the patient have difficulty seeing, even when wearing glasses/contacts?: No Does the  patient have difficulty concentrating, remembering, or making decisions?: No  Permission Sought/Granted Permission sought to share information with : Family Supports Permission granted to share information with : Yes, Verbal Permission Granted  Share Information with NAME: daughter Russ Course  Permission granted to share info w AGENCY: SNF        Emotional Assessment Appearance:: Appears stated age Attitude/Demeanor/Rapport: Engaged Affect (typically observed): Appropriate, Pleasant Orientation: : Oriented to Self, Oriented to Place, Oriented to  Time, Oriented to Situation      Admission diagnosis:  Lumbar disc herniation [M51.26] Patient Active Problem List   Diagnosis Date Noted   Lumbar disc herniation 06/11/2023   Hypothyroidism 06/11/2023   Thrombocytosis 06/11/2023   Memory impairment 06/11/2023   CKD (chronic kidney disease), stage III (HCC) 06/11/2023   Closed right ankle fracture 09/10/2020   CKD (chronic kidney disease), stage II 09/10/2020   Cervical spine fracture (HCC) 06/03/2019   Syncope, vasovagal 09/27/2017   Syncope and collapse 09/26/2017   Fracture dislocation of right shoulder joint 09/24/2017   Shoulder fracture, right 09/24/2017   Chronic diastolic CHF (congestive heart failure) (HCC) 09/24/2017   Leucocytosis 09/24/2017   Cellulitis 05/05/2015   S/P AVR 10/26/2010   Aortic stenosis    Essential hypertension    PVC's (premature ventricular contractions)    Hyperlipidemia    PCP:  Jeannine Milroy., MD Pharmacy:   [facility] [place], [place] - [address] [place] [address] Phone: [phone] Fax: [phone]     Social Drivers of Health (SDOH) Social History: SDOH Screenings   Food Insecurity: No Food Insecurity (06/11/2023)  Housing: Low Risk  (06/11/2023)  Transportation Needs: No Transportation Needs (06/11/2023)  Utilities: Not At Risk (06/11/2023)  Alcohol  Screen: Low Risk   (06/30/2019)  Depression (PHQ2-9): Low Risk  (12/08/2020)  Financial Resource Strain: Low Risk  (06/30/2019)  Physical Activity: Inactive (06/30/2019)  Social Connections: Moderately Isolated (06/11/2023)  Stress: No Stress Concern Present (06/30/2019)  Tobacco Use: Low Risk  (06/10/2023)   SDOH Interventions:     Readmission Risk Interventions     No data to display

## 2023-06-14 NOTE — Care Management Important Message (Signed)
 Important Message  Patient Details  Name: Megan Pitts MRN: [id number] Date of Birth: [birth year]   Important Message Given:  Yes - Medicare IM     Felix Host 06/14/2023, 11:23 AM

## 2023-06-14 NOTE — Progress Notes (Signed)
 [facility] PROGRESS NOTE    Progress Note  Megan Pitts  ZOX:[id number] DOB: 21-Sep-1927 DOA: 06/10/2023 PCP: Jeannine Milroy., MD     Brief Narrative:   Megan Pitts is an 89 y.o. female past medical history of essential hypertension, Paroxysmal atrial fibrillation not on anticoagulation, history of TAVR's, hypothyroidism comes in after starting having right leg weakness and inability to stand and fall, after this she has been having constant pain worse when trying to raise the leg, imaging of the right femur and lumbar spine did not reveal any acute abnormalities.  MRI of the lumbar spine shows severe left facet arthropathy at L5-S1 and a 6 mm anterior protruding synovial cyst compressing on the left L4 nerve root, central disc protrusion with compression of L2-L3.  Neurosurgery was consulted   Assessment/Plan:   Acute Lumbar disc herniation with right leg weakness: Neurosurgery  managing her pain with lidocaine  IV fentanyl  and oxycodone . The plan for discectomy, radiating injury is improving.  She below for the details. Started on a bowel regimen MiraLAX  p.o. twice daily. Anticipate will need skilled nursing facility  Acute kidney injury on chronic kidney disease stage IIIa/hypovolemic hyponatremia: Baseline creatinine around 1 and admission 1.5. He was started on IV fluids urine output improved. Likely hemodynamically mediated creatinine this morning is 1.2. Recheck basic metabolic panel in the morning. Allow permissive hypertension.  Leucocytosis Likely reactive has remained afebrile. Improving slowly  Essential hypertension: Hold hydrochlorothiazide  Avapro  and amlodipine  Allow permissive hypertension in the setting of acute kidney injury.  Hypothyroidism: Continue Synthroid.  Thrombocytosis: Likely reactive.  Memory impairment: Continue donezepil.   DVT prophylaxis: lovenox  Family Communication:none Status is: Inpatient Remains inpatient appropriate  because: Lumbar disc herniation    Code Status:     Code Status Orders  (From admission, onward)           Start     Ordered   06/11/23 1351  Do not attempt resuscitation (DNR) Pre-Arrest Interventions Desired  (Code Status)  Continuous       Question Answer Comment  If pulseless and not breathing No CPR or chest compressions.   In Pre-Arrest Conditions (Patient Has Pulse and Is Breathing) May intubate, use advanced airway interventions and cardioversion/ACLS medications if appropriate or indicated. May transfer to ICU.   Consent: Discussion documented in EHR or advanced directives reviewed      06/11/23 1350           Code Status History     Date Active Date Inactive Code Status Order ID Comments User Context   06/11/2023 0918 06/11/2023 1350 Full Code [id number]  Lena Qualia, MD ED   09/10/2020 1640 09/16/2020 2003 Full Code [id number]  Raymona Caldwell, MD ED   06/03/2019 2055 06/06/2019 1921 Full Code [id number]  Davida Espy, MD Inpatient   09/24/2017 1703 09/29/2017 1637 Full Code [id number]  Leona Rake, MD ED   05/05/2015 2030 05/06/2015 1734 Full Code [id number]  Chere Cordon, MD Inpatient      Advance Directive Documentation    Flowsheet Row Most Recent Value  Type of Advance Directive Healthcare Power of Attorney, Living will, Out of facility DNR (pink MOST or yellow form)  Pre-existing out of facility DNR order (yellow form or pink MOST form) Physician notified to receive inpatient order  "MOST" Form in Place? --         IV Access:   Peripheral IV   Procedures and diagnostic studies:   No results found.   Medical  Consultants:   None.   Subjective:    Megan Pitts pain is controlled  Objective:    Vitals:   06/13/23 1449 06/13/23 2008 06/14/23 0430 06/14/23 0736  BP: (!) 144/53 (!) 123/54 (!) 155/67 (!) 151/49  Pulse: 75 66 84 78  Resp: 16 18  16   Temp: 98.1 F (36.7 C) 98 F (36.7 C) 98.2 F (36.8 C) 98 F (36.7 C)  TempSrc:   Oral Oral   SpO2: 97% 96% 95% 96%  Weight:      Height:       SpO2: 96 %   Intake/Output Summary (Last 24 hours) at 06/14/2023 0852 Last data filed at 06/13/2023 1814 Gross per 24 hour  Intake --  Output 750 ml  Net -750 ml   Filed Weights   06/10/23 0610 06/11/23 1217  Weight: 73 kg 78.6 kg    Exam: General exam: In no acute distress. Respiratory system: Good air movement and clear to auscultation. Cardiovascular system: S1 & S2 heard, RRR. No JVD. Gastrointestinal system: Abdomen is nondistended, soft and nontender.  Extremities: No pedal edema. Skin: No rashes, lesions or ulcers Psychiatry: Judgement and insight appear normal. Mood & affect appropriate.  Data Reviewed:    Labs: Basic Metabolic Panel: Recent Labs  Lab 06/10/23 1220 06/12/23 0846 06/13/23 0609 06/14/23 0608  NA 133* 127* 130* 131*  K 4.0 4.0 3.7 3.8  CL 100 93* 100 104  CO2 22 22 21* 20*  GLUCOSE 158* 190* 107* 106*  BUN 23 26* 32* 21  CREATININE 1.11* 1.55* 1.67* 1.21*  CALCIUM  9.3 8.4* 7.6* 7.3*   GFR Estimated Creatinine Clearance: 28.8 mL/min (A) (by C-G formula based on SCr of 1.21 mg/dL (H)). Liver Function Tests: No results for input(s): "AST", "ALT", "ALKPHOS", "BILITOT", "PROT", "ALBUMIN" in the last 168 hours. No results for input(s): "LIPASE", "AMYLASE" in the last 168 hours. No results for input(s): "AMMONIA" in the last 168 hours. Coagulation profile No results for input(s): "INR", "PROTIME" in the last 168 hours. COVID-19 Labs  No results for input(s): "DDIMER", "FERRITIN", "LDH", "CRP" in the last 72 hours.  Lab Results  Component Value Date   SARSCOV2NAA NEGATIVE 09/16/2020   SARSCOV2NAA NEGATIVE 09/10/2020   SARSCOV2NAA NEGATIVE 06/05/2019   SARSCOV2NAA NEGATIVE 06/03/2019    CBC: Recent Labs  Lab 06/10/23 1220 06/12/23 0846 06/13/23 0609 06/14/23 0608  WBC 15.3* 12.1* 16.4* 11.4*  HGB 15.4* 15.1* 13.0 12.8  HCT 44.8 45.3 37.9 37.6  MCV 83.7 85.3 84.8 84.7   PLT 403* 378 325 309   Cardiac Enzymes: No results for input(s): "CKTOTAL", "CKMB", "CKMBINDEX", "TROPONINI" in the last 168 hours. BNP (last 3 results) No results for input(s): "PROBNP" in the last 8760 hours. CBG: No results for input(s): "GLUCAP" in the last 168 hours. D-Dimer: No results for input(s): "DDIMER" in the last 72 hours. Hgb A1c: No results for input(s): "HGBA1C" in the last 72 hours. Lipid Profile: No results for input(s): "CHOL", "HDL", "LDLCALC", "TRIG", "CHOLHDL", "LDLDIRECT" in the last 72 hours. Thyroid  function studies: No results for input(s): "TSH", "T4TOTAL", "T3FREE", "THYROIDAB" in the last 72 hours.  Invalid input(s): "FREET3" Anemia work up: No results for input(s): "VITAMINB12", "FOLATE", "FERRITIN", "TIBC", "IRON", "RETICCTPCT" in the last 72 hours. Sepsis Labs: Recent Labs  Lab 06/10/23 1220 06/12/23 0846 06/13/23 0609 06/14/23 0608  WBC 15.3* 12.1* 16.4* 11.4*   Microbiology Recent Results (from the past 240 hours)  Surgical pcr screen     Status: None   Collection  Time: 06/13/23  8:38 AM   Specimen: Nasal Mucosa; Nasal Swab  Result Value Ref Range Status   MRSA, PCR NEGATIVE NEGATIVE Final   Staphylococcus aureus NEGATIVE NEGATIVE Final    Comment: (NOTE) The Xpert SA Assay (FDA approved for NASAL specimens in patients 48 years of age and older), is one component of a comprehensive surveillance program. It is not intended to diagnose infection nor to guide or monitor treatment. Performed at [facility], [address] [place], [place] [address]      Medications:    diclofenac Sodium  2 g Topical Once   donepezil   10 mg Oral QHS   enoxaparin  (LOVENOX ) injection  30 mg Subcutaneous Q24H   levothyroxine  50 mcg Oral Q0600   lidocaine   1 patch Transdermal Q24H   polyethylene glycol  17 g Oral BID   sodium chloride  flush  3 mL Intravenous Q12H   sodium chloride  flush  3-10 mL Intravenous Q12H   Continuous Infusions:   sodium chloride      promethazine (PHENERGAN) injection (IM or IVPB) Stopped (06/11/23 1557)      LOS: 3 days   Macdonald Savoy  [facility]  06/14/2023, 8:52 AM

## 2023-06-14 NOTE — Progress Notes (Signed)
 Physical Therapy Treatment Patient Details Name: Megan Pitts MRN: [id number] DOB: 12/29/1927 Today's Date: 06/14/2023   History of Present Illness Pt is a 89 y/o F admitted on 06/10/23 after presenting with c/o RLE giving out & causing her to fall. MRI shows disc eccentric to the right at L2/3. PMH: a-fib, aortic stenosis, GERD, HLD, HTN, shingles, vertigo    PT Comments  Pt resting comfortably in bed upon arrival to room. Pt motivated to sit up on EOB, overall requiring light assist for log roll technique for back and for standing at EOB. Pt with mild to moderate RLE burning-type pain, but pt alleviated by return to supine with knees slightly bent in bed and Les supported. Pt wanting to do LE exercises, PT discouraged this at this time as to not increase neural tension. Pt plans to have surgery tomorrow.      If plan is discharge home, recommend the following: A lot of help with walking and/or transfers;A lot of help with bathing/dressing/bathroom;Assist for transportation;Assistance with cooking/housework;Help with stairs or ramp for entrance   Can travel by private vehicle        Equipment Recommendations  Other (comment)    Recommendations for Other Services       Precautions / Restrictions Precautions Precautions: Fall;Back Precaution/Restrictions Comments: reviewed log roll, avoiding neural tension RLE Restrictions Weight Bearing Restrictions Per Provider Order: No Other Position/Activity Restrictions: no bedrest orders     Mobility  Bed Mobility Overal bed mobility: Needs Assistance Bed Mobility: Rolling, Sidelying to Sit, Sit to Sidelying Rolling: Contact guard assist Sidelying to sit: Min assist     Sit to sidelying: Min assist General bed mobility comments: assist for roll towards R, trunk elevation and lowering    Transfers Overall transfer level: Needs assistance Equipment used: Rolling walker (2 wheels) Transfers: Sit to/from Stand Sit to Stand: Min assist            General transfer comment: assist for power up and steadying, PT replacing bed pads as pt's soiled in urine. Stand x2. limited WB RLE given RLE weakness    Ambulation/Gait                   Stairs             Wheelchair Mobility     Tilt Bed    Modified Rankin (Stroke Patients Only)       Balance Overall balance assessment: Needs assistance Sitting-balance support: Feet supported Sitting balance-Leahy Scale: Fair Sitting balance - Comments: supervision static sitting   Standing balance support: Bilateral upper extremity supported, Reliant on assistive device for balance, During functional activity Standing balance-Leahy Scale: Poor                              Communication Communication Communication: Impaired Factors Affecting Communication: Hearing impaired  Cognition Arousal: Alert Behavior During Therapy: WFL for tasks assessed/performed   PT - Cognitive impairments: No apparent impairments                           Following commands impaired: Follows one step commands with increased time    Cueing Cueing Techniques: Verbal cues, Visual cues, Gestural cues, Tactile cues  Exercises      General Comments        Pertinent Vitals/Pain Pain Assessment Pain Assessment: Faces Faces Pain Scale: Hurts little more Pain Location: RLE Pain Descriptors /  Indicators: Burning, Discomfort Pain Intervention(s): Limited activity within patient's tolerance, Monitored during session, Repositioned    Home Living                          Prior Function            PT Goals (current goals can now be found in the care plan section) Acute Rehab PT Goals Patient Stated Goal: decreased pain, get better, go home PT Goal Formulation: With patient/family Time For Goal Achievement: 06/25/23 Potential to Achieve Goals: Fair Progress towards PT goals: Progressing toward goals    Frequency    Min 2X/week       PT Plan      Co-evaluation              AM-PAC PT "6 Clicks" Mobility   Outcome Measure  Help needed turning from your back to your side while in a flat bed without using bedrails?: A Little Help needed moving from lying on your back to sitting on the side of a flat bed without using bedrails?: A Little Help needed moving to and from a bed to a chair (including a wheelchair)?: A Little Help needed standing up from a chair using your arms (e.g., wheelchair or bedside chair)?: A Little Help needed to walk in hospital room?: Total Help needed climbing 3-5 steps with a railing? : Total 6 Click Score: 14    End of Session   Activity Tolerance: Patient tolerated treatment well Patient left: in bed;with call bell/phone within reach;with bed alarm set Nurse Communication: Mobility status PT Visit Diagnosis: Difficulty in walking, not elsewhere classified (R26.2);Pain;Muscle weakness (generalized) (M62.81);Other abnormalities of gait and mobility (R26.89) Pain - Right/Left: Right Pain - part of body: Leg;Hip     Time: 4098-1191 PT Time Calculation (min) (ACUTE ONLY): 19 min  Charges:    $Therapeutic Activity: 8-22 mins PT General Charges $$ ACUTE PT VISIT: 1 Visit                     Shirlene Doughty, PT DPT Acute Rehabilitation Services Secure Chat Preferred  Office [phone]    Kealey Kemmer Cydney Draft 06/14/2023, 4:58 PM

## 2023-06-15 ENCOUNTER — Encounter (HOSPITAL_COMMUNITY): Payer: Self-pay | Admitting: Internal Medicine

## 2023-06-15 ENCOUNTER — Inpatient Hospital Stay (HOSPITAL_COMMUNITY)

## 2023-06-15 ENCOUNTER — Inpatient Hospital Stay (HOSPITAL_COMMUNITY)
Admission: EM | Disposition: A | Discharge status: Skilled Nursing Facility | Payer: Self-pay | Source: Home / Self Care | Attending: Internal Medicine

## 2023-06-15 DIAGNOSIS — I130 Hypertensive heart and chronic kidney disease with heart failure and stage 1 through stage 4 chronic kidney disease, or unspecified chronic kidney disease: Secondary | ICD-10-CM

## 2023-06-15 DIAGNOSIS — M5126 Other intervertebral disc displacement, lumbar region: Secondary | ICD-10-CM

## 2023-06-15 DIAGNOSIS — I5032 Chronic diastolic (congestive) heart failure: Secondary | ICD-10-CM | POA: Diagnosis not present

## 2023-06-15 DIAGNOSIS — E039 Hypothyroidism, unspecified: Secondary | ICD-10-CM | POA: Diagnosis not present

## 2023-06-15 DIAGNOSIS — N183 Chronic kidney disease, stage 3 unspecified: Secondary | ICD-10-CM | POA: Diagnosis not present

## 2023-06-15 DIAGNOSIS — D72829 Elevated white blood cell count, unspecified: Secondary | ICD-10-CM | POA: Diagnosis not present

## 2023-06-15 HISTORY — PX: LUMBAR LAMINECTOMY/DECOMPRESSION MICRODISCECTOMY: SHX5026

## 2023-06-15 LAB — CBC
HCT: 40.1 % (ref 36.0–46.0)
Hemoglobin: 13.7 g/dL (ref 12.0–15.0)
MCH: 29 pg (ref 26.0–34.0)
MCHC: 34.2 g/dL (ref 30.0–36.0)
MCV: 84.8 fL (ref 80.0–100.0)
Platelets: 383 10*3/uL (ref 150–400)
RBC: 4.73 MIL/uL (ref 3.87–5.11)
RDW: 13.2 % (ref 11.5–15.5)
WBC: 12 10*3/uL — ABNORMAL HIGH (ref 4.0–10.5)
nRBC: 0 % (ref 0.0–0.2)

## 2023-06-15 LAB — BASIC METABOLIC PANEL WITH GFR
Anion gap: 10 (ref 5–15)
BUN: 12 mg/dL (ref 8–23)
CO2: 19 mmol/L — ABNORMAL LOW (ref 22–32)
Calcium: 8 mg/dL — ABNORMAL LOW (ref 8.9–10.3)
Chloride: 105 mmol/L (ref 98–111)
Creatinine, Ser: 0.87 mg/dL (ref 0.44–1.00)
GFR, Estimated: >60 mL/min (ref >60)
Glucose, Bld: 125 mg/dL — ABNORMAL HIGH (ref 70–99)
Potassium: 3.9 mmol/L (ref 3.5–5.1)
Sodium: 134 mmol/L — ABNORMAL LOW (ref 135–145)

## 2023-06-15 SURGERY — LUMBAR LAMINECTOMY/DECOMPRESSION MICRODISCECTOMY 1 LEVEL
Anesthesia: General | Site: Back

## 2023-06-15 MED ORDER — BUPIVACAINE HCL (PF) 0.5 % IJ SOLN
INTRAMUSCULAR | Status: AC
  Filled 2023-06-15: qty 30

## 2023-06-15 MED ORDER — LIDOCAINE 2% (20 MG/ML) 5 ML SYRINGE
INTRAMUSCULAR | Status: DC | PRN
Start: 2023-06-15 — End: 2023-06-16
  Administered 2023-06-15: 60 mg via INTRAVENOUS

## 2023-06-15 MED ORDER — OXYCODONE HCL 5 MG/5ML PO SOLN: 5.0000 mg | Freq: Once | ORAL | Status: DC | PRN

## 2023-06-15 MED ORDER — PHENYLEPHRINE HCL-NACL 20-0.9 MG/250ML-% IV SOLN
INTRAVENOUS | Status: DC | PRN
  Administered 2023-06-15: 25 ug/min via INTRAVENOUS

## 2023-06-15 MED ORDER — PHENYLEPHRINE 80 MCG/ML (10ML) SYRINGE FOR IV PUSH (FOR BLOOD PRESSURE SUPPORT)
PREFILLED_SYRINGE | INTRAVENOUS | Status: DC | PRN
  Administered 2023-06-15: 80 ug via INTRAVENOUS

## 2023-06-15 MED ORDER — AMLODIPINE BESYLATE 10 MG PO TABS
10.0000 mg | ORAL_TABLET | Freq: Every day | ORAL | Status: DC
  Administered 2023-06-15 – 2023-06-18 (×4): 10 mg via ORAL
  Filled 2023-06-15 (×4): qty 1

## 2023-06-15 MED ORDER — 0.9 % SODIUM CHLORIDE (POUR BTL) OPTIME
TOPICAL | Status: DC | PRN
  Administered 2023-06-15: 1000 mL

## 2023-06-15 MED ORDER — METHYLPREDNISOLONE ACETATE 80 MG/ML IJ SUSP
INTRAMUSCULAR | Status: AC
  Filled 2023-06-15: qty 1

## 2023-06-15 MED ORDER — LIDOCAINE-EPINEPHRINE 0.5 %-1:200000 IJ SOLN
INTRAMUSCULAR | Status: AC
  Filled 2023-06-15: qty 50

## 2023-06-15 MED ORDER — FENTANYL CITRATE (PF) 250 MCG/5ML IJ SOLN
INTRAMUSCULAR | Status: DC | PRN
  Administered 2023-06-15 (×3): 50 ug via INTRAVENOUS
  Administered 2023-06-16: 25 ug via INTRAVENOUS

## 2023-06-15 MED ORDER — ACETAMINOPHEN 10 MG/ML IV SOLN
INTRAVENOUS | Status: AC
  Filled 2023-06-15: qty 100

## 2023-06-15 MED ORDER — OXYCODONE HCL 5 MG PO TABS: 5.0000 mg | ORAL_TABLET | Freq: Once | ORAL | Status: DC | PRN

## 2023-06-15 MED ORDER — FENTANYL CITRATE (PF) 250 MCG/5ML IJ SOLN
INTRAMUSCULAR | Status: AC
  Filled 2023-06-15: qty 5

## 2023-06-15 MED ORDER — LIDOCAINE-EPINEPHRINE 0.5 %-1:200000 IJ SOLN
INTRAMUSCULAR | Status: DC | PRN
Start: 2023-06-15 — End: 2023-06-16
  Administered 2023-06-15: 10 mL via INTRADERMAL

## 2023-06-15 MED ORDER — FENTANYL CITRATE (PF) 100 MCG/2ML IJ SOLN: 25.0000 ug | INTRAMUSCULAR | Status: DC | PRN

## 2023-06-15 MED ORDER — CEFAZOLIN SODIUM-DEXTROSE 2-3 GM-%(50ML) IV SOLR
INTRAVENOUS | Status: DC | PRN
  Administered 2023-06-15: 2 g via INTRAVENOUS

## 2023-06-15 MED ORDER — LACTATED RINGERS IV SOLN: INTRAVENOUS | Status: DC | PRN

## 2023-06-15 MED ORDER — DEXAMETHASONE SODIUM PHOSPHATE 10 MG/ML IJ SOLN
INTRAMUSCULAR | Status: DC | PRN
  Administered 2023-06-15: 10 mg via INTRAVENOUS

## 2023-06-15 MED ORDER — THROMBIN (RECOMBINANT) 5000 UNITS EX SOLR: CUTANEOUS | Status: DC | PRN

## 2023-06-15 MED ORDER — THROMBIN 5000 UNITS EX KIT
PACK | CUTANEOUS | Status: AC
  Filled 2023-06-15: qty 2

## 2023-06-15 MED ORDER — ACETAMINOPHEN 10 MG/ML IV SOLN
INTRAVENOUS | Status: DC | PRN
  Administered 2023-06-15: 1000 mg via INTRAVENOUS

## 2023-06-15 MED ORDER — ROCURONIUM BROMIDE 10 MG/ML (PF) SYRINGE
PREFILLED_SYRINGE | INTRAVENOUS | Status: DC | PRN
Start: 2023-06-15 — End: 2023-06-16
  Administered 2023-06-15: 20 mg via INTRAVENOUS
  Administered 2023-06-15: 50 mg via INTRAVENOUS

## 2023-06-15 MED ORDER — PROPOFOL 10 MG/ML IV BOLUS
INTRAVENOUS | Status: AC
Start: 2023-06-15 — End: ?
  Filled 2023-06-15: qty 20

## 2023-06-15 MED ORDER — ONDANSETRON HCL 4 MG/2ML IJ SOLN: 4.0000 mg | Freq: Four times a day (QID) | INTRAMUSCULAR | Status: DC | PRN

## 2023-06-15 MED ORDER — PROPOFOL 10 MG/ML IV BOLUS
INTRAVENOUS | Status: DC | PRN
Start: 2023-06-15 — End: 2023-06-16
  Administered 2023-06-15: 70 mg via INTRAVENOUS
  Administered 2023-06-16 (×2): 10 mg via INTRAVENOUS

## 2023-06-15 MED ORDER — FENTANYL CITRATE (PF) 100 MCG/2ML IJ SOLN
INTRAMUSCULAR | Status: AC
  Filled 2023-06-15: qty 2

## 2023-06-15 MED ORDER — ENOXAPARIN SODIUM 40 MG/0.4ML IJ SOSY
40.0000 mg | PREFILLED_SYRINGE | INTRAMUSCULAR | Status: DC
  Administered 2023-06-16 – 2023-06-18 (×3): 40 mg via SUBCUTANEOUS
  Filled 2023-06-15 (×3): qty 0.4

## 2023-06-15 SURGICAL SUPPLY — 45 items
BAG COUNTER SPONGE SURGICOUNT (BAG) ×1 IMPLANT
BAND RUBBER #18 3X1/16 STRL (MISCELLANEOUS) IMPLANT
BENZOIN TINCTURE PRP APPL 2/3 (GAUZE/BANDAGES/DRESSINGS) IMPLANT
BLADE CLIPPER SURG (BLADE) IMPLANT
BUR MATCHSTICK NEURO 3.0 LAGG (BURR) ×1 IMPLANT
BUR PRECISION FLUTE 5.0 (BURR) IMPLANT
CANISTER SUCTION 3000ML PPV (SUCTIONS) ×1 IMPLANT
DERMABOND ADVANCED .7 DNX12 (GAUZE/BANDAGES/DRESSINGS) ×1 IMPLANT
DERMABOND ADVANCED .7 DNX6 (GAUZE/BANDAGES/DRESSINGS) IMPLANT
DRAPE LAPAROTOMY 100X72X124 (DRAPES) ×1 IMPLANT
DRAPE MICROSCOPE SLANT 54X150 (MISCELLANEOUS) ×1 IMPLANT
DRAPE SURG 17X23 STRL (DRAPES) ×1 IMPLANT
DRSG OPSITE POSTOP 4X6 (GAUZE/BANDAGES/DRESSINGS) IMPLANT
DURAPREP 26ML APPLICATOR (WOUND CARE) ×1 IMPLANT
ELECTRODE REM PT RTRN 9FT ADLT (ELECTROSURGICAL) ×1 IMPLANT
GAUZE 4X4 16PLY ~~LOC~~+RFID DBL (SPONGE) IMPLANT
GAUZE SPONGE 4X4 12PLY STRL (GAUZE/BANDAGES/DRESSINGS) IMPLANT
GLOVE BIO SURGEON STRL SZ7 (GLOVE) IMPLANT
GLOVE ECLIPSE 6.5 STRL STRAW (GLOVE) ×1 IMPLANT
GLOVE EXAM NITRILE XL STR (GLOVE) IMPLANT
GOWN STRL REUS W/ TWL LRG LVL3 (GOWN DISPOSABLE) ×2 IMPLANT
GOWN STRL REUS W/ TWL XL LVL3 (GOWN DISPOSABLE) IMPLANT
GOWN STRL REUS W/TWL 2XL LVL3 (GOWN DISPOSABLE) IMPLANT
KIT BASIN OR (CUSTOM PROCEDURE TRAY) ×1 IMPLANT
KIT TURNOVER KIT B (KITS) ×1 IMPLANT
NDL HYPO 25X1 1.5 SAFETY (NEEDLE) ×1 IMPLANT
NDL SPNL 18GX3.5 QUINCKE PK (NEEDLE) IMPLANT
NEEDLE HYPO 25X1 1.5 SAFETY (NEEDLE) ×1 IMPLANT
NEEDLE SPNL 18GX3.5 QUINCKE PK (NEEDLE) ×2 IMPLANT
NS IRRIG 1000ML POUR BTL (IV SOLUTION) ×1 IMPLANT
PACK LAMINECTOMY NEURO (CUSTOM PROCEDURE TRAY) ×1 IMPLANT
PAD ARMBOARD POSITIONER FOAM (MISCELLANEOUS) ×3 IMPLANT
PATTIES SURGICAL .5 X.5 (GAUZE/BANDAGES/DRESSINGS) ×1 IMPLANT
PATTIES SURGICAL 1X1 (DISPOSABLE) ×1 IMPLANT
SPIKE FLUID TRANSFER (MISCELLANEOUS) ×1 IMPLANT
SPONGE SURGIFOAM ABS GEL SZ50 (HEMOSTASIS) ×1 IMPLANT
SPONGE T-LAP 4X18 ~~LOC~~+RFID (SPONGE) IMPLANT
STRIP CLOSURE SKIN 1/2X4 (GAUZE/BANDAGES/DRESSINGS) IMPLANT
SUT VIC AB 0 CT1 18XCR BRD8 (SUTURE) ×1 IMPLANT
SUT VIC AB 2-0 CT1 18 (SUTURE) ×1 IMPLANT
SUT VIC AB 3-0 SH 8-18 (SUTURE) ×1 IMPLANT
SYR 30ML SLIP (SYRINGE) ×1 IMPLANT
TOWEL GREEN STERILE (TOWEL DISPOSABLE) ×1 IMPLANT
TOWEL GREEN STERILE FF (TOWEL DISPOSABLE) ×1 IMPLANT
WATER STERILE IRR 1000ML POUR (IV SOLUTION) ×1 IMPLANT

## 2023-06-15 NOTE — Evaluation (Signed)
 Occupational Therapy Evaluation Patient Details Name: Megan Pitts MRN: [id number] DOB: Feb 18, 1927 Today's Date: 06/15/2023   History of Present Illness   Pt is a 89 y/o F admitted on 06/10/23 after presenting with c/o RLE giving out & causing her to fall. MRI shows disc eccentric to the right at L2/3. PMH: a-fib, aortic stenosis, GERD, HLD, HTN, shingles, vertigo     Clinical Impressions Pt admitted based on above, and was seen based on problem list below. PTA pt was independent with ADLs and IADLs. Today pt is requiring set up  to mod assist for ADLs. Educated on maintaining back precautions for comfort. Pt verbalizing understanding, but unable to maintain precautions functionally. Bed mobility was min HH assist to log roll, STS x3 trials completed with min to mod assist. Unable to take lateral steps toward [facility] d/t R knee buckling. Recommendation of <3 hours of skilled rehab daily. OT will continue to follow acutely to maximize functional independence.        If plan is discharge home, recommend the following:   A lot of help with walking and/or transfers;A lot of help with bathing/dressing/bathroom     Functional Status Assessment   Patient has had a recent decline in their functional status and demonstrates the ability to make significant improvements in function in a reasonable and predictable amount of time.     Equipment Recommendations   Other (comment) (Defer to next venue)     Recommendations for Other Services         Precautions/Restrictions   Precautions Precautions: Fall;Back Precaution/Restrictions Comments: reviewed log roll, avoiding neural tension RLE Restrictions Weight Bearing Restrictions Per Provider Order: No Other Position/Activity Restrictions: no bedrest orders     Mobility Bed Mobility Overal bed mobility: Needs Assistance Bed Mobility: Rolling, Sidelying to Sit, Sit to Sidelying Rolling: Contact guard assist Sidelying to sit: Min  assist     Sit to sidelying: Contact guard assist, HOB elevated, Used rails General bed mobility comments: able to roll, pt reaching for [facility] assist, light min assist    Transfers Overall transfer level: Needs assistance Equipment used: Rolling walker (2 wheels) Transfers: Sit to/from Stand Sit to Stand: Min assist           General transfer comment: Min assist to come to standing, unable to take lateral step to [facility] d/t R knee buckling      Balance Overall balance assessment: Needs assistance Sitting-balance support: Feet supported Sitting balance-Leahy Scale: Fair     Standing balance support: Bilateral upper extremity supported, Reliant on assistive device for balance, During functional activity Standing balance-Leahy Scale: Poor Standing balance comment: Reliant on RW       ADL either performed or assessed with clinical judgement   ADL Overall ADL's : Needs assistance/impaired Eating/Feeding: Set up;Sitting   Grooming: Set up;Sitting   Upper Body Bathing: Set up;Sitting   Lower Body Bathing: Moderate assistance;Sit to/from stand Lower Body Bathing Details (indicate cue type and reason): Mod assist to stand once in standing fair balance Upper Body Dressing : Set up;Sitting   Lower Body Dressing: Moderate assistance;Sit to/from stand Lower Body Dressing Details (indicate cue type and reason): Pt unable to figure 4 RLE to maintain back precautions for comfort     Toileting- Clothing Manipulation and Hygiene: Moderate assistance;Sit to/from stand Toileting - Clothing Manipulation Details (indicate cue type and reason): Assist to maintain standing balance       General ADL Comments: Pt understanding compensatory strategies for back precautions unable to  maintain them functionally     Vision Baseline Vision/History: 1 Wears glasses Vision Assessment?: No apparent visual deficits            Pertinent Vitals/Pain Pain Assessment Pain Assessment: Faces Faces  Pain Scale: Hurts little more Pain Location: RLE Pain Descriptors / Indicators: Burning, Discomfort Pain Intervention(s): Limited activity within patient's tolerance     Extremity/Trunk Assessment Upper Extremity Assessment Upper Extremity Assessment: Generalized weakness   Lower Extremity Assessment Lower Extremity Assessment: Defer to PT evaluation   Cervical / Trunk Assessment Cervical / Trunk Assessment: Kyphotic   Communication Communication Communication: Impaired Factors Affecting Communication: Hearing impaired   Cognition Arousal: Alert Behavior During Therapy: WFL for tasks assessed/performed Cognition: Cognition impaired     Awareness: Intellectual awareness intact, Online awareness impaired Memory impairment (select all impairments): Short-term memory Attention impairment (select first level of impairment): Sustained attention, Selective attention Executive functioning impairment (select all impairments): Organization, Sequencing, Reasoning     Following commands: Impaired Following commands impaired: Follows one step commands with increased time     Cueing  General Comments   Cueing Techniques: Verbal cues;Visual cues;Gestural cues;Tactile cues  pt c/o of SOB during mobility, maintaining O2 stats at 94 and above, cues for pursed lip breathing           Home Living Family/patient expects to be discharged to:: Private residence Living Arrangements: Alone Available Help at Discharge: Family;Available PRN/intermittently Type of Home:  (Condo) Home Access: Stairs to enter [employer] of Steps: 1 step onto porch   Home Layout: One level     Bathroom Shower/Tub: Producer, television/film/video: Standard Bathroom Accessibility: Yes How Accessible: Accessible via walker Home Equipment: Educational psychologist (4 wheels);Transport chair;Grab bars - tub/shower          Prior Functioning/Environment Prior Level of Function :  Independent/Modified Independent       Mobility Comments: Only the 1 fall prior to admission in the past 6 months      OT Problem List: Decreased strength;Decreased range of motion;Decreased activity tolerance;Impaired balance (sitting and/or standing);Decreased knowledge of use of DME or AE   OT Treatment/Interventions: Self-care/ADL training;Therapeutic exercise;Energy conservation;DME and/or AE instruction;Therapeutic activities;Patient/family education;Balance training      OT Goals(Current goals can be found in the care plan section)   Acute Rehab OT Goals Patient Stated Goal: To go to rehab OT Goal Formulation: With patient Time For Goal Achievement: 06/29/23 Potential to Achieve Goals: Good   OT Frequency:  Min 2X/week       AM-PAC OT "6 Clicks" Daily Activity     Outcome Measure Help from another person eating meals?: None Help from another person taking care of personal grooming?: A Little Help from another person toileting, which includes using toliet, bedpan, or urinal?: A Lot Help from another person bathing (including washing, rinsing, drying)?: A Lot Help from another person to put on and taking off regular upper body clothing?: A Little Help from another person to put on and taking off regular lower body clothing?: A Lot 6 Click Score: 16   End of Session Equipment Utilized During Treatment: Gait belt;Rolling walker (2 wheels) Nurse Communication: Mobility status  Activity Tolerance: Patient tolerated treatment well Patient left: in bed;with call bell/phone within reach;with bed alarm set  OT Visit Diagnosis: Unsteadiness on feet (R26.81);Other abnormalities of gait and mobility (R26.89);Muscle weakness (generalized) (M62.81)                Time: 0981-1914 OT Time Calculation (min): 15  min Charges:  OT General Charges $OT Visit: 1 Visit OT Evaluation $OT Eval Moderate Complexity: 1 Mod  Delmer Ferraris, OT  Acute Rehabilitation Services Office  [phone] Secure chat preferred   Mickael Alamo 06/15/2023, 2:35 PM

## 2023-06-15 NOTE — Progress Notes (Signed)
 [facility] PROGRESS NOTE    Progress Note  Megan Pitts  WUJ:[id number] DOB: 18-May-1927 DOA: 06/10/2023 PCP: Jeannine Milroy., MD     Brief Narrative:   Megan Pitts is an 89 y.o. female past medical history of essential hypertension, Paroxysmal atrial fibrillation not on anticoagulation, history of TAVR's, hypothyroidism comes in after starting having right leg weakness and inability to stand and fall, after this she has been having constant pain worse when trying to raise the leg, imaging of the right femur and lumbar spine did not reveal any acute abnormalities.  MRI of the lumbar spine shows severe left facet arthropathy at L5-S1 and a 6 mm anterior protruding synovial cyst compressing on the left L4 nerve root, central disc protrusion with compression of L2-L3.  Neurosurgery was consulted  Assessment/Plan:   Acute Lumbar disc herniation with right leg weakness: Neurosurgery  managing her pain with lidocaine  IV fentanyl  and oxycodone . The plan for discectomy, her creatinine has returned to baseline. Neurosurgery to dictate when to proceed with surgical intervention. Risk and benefit explain to the patient and family. Started on a bowel regimen MiraLAX  p.o. twice daily. Anticipate will need skilled nursing facility  Acute kidney injury on chronic kidney disease stage IIIa/hypovolemic hyponatremia: Baseline creatinine around 1 and admission 1.5. Likely hemodynamically mediated creatinine improved to baseline.  Leucocytosis Likely reactive has remained afebrile. Improving slowly  Essential hypertension: Hold hydrochlorothiazide  Avapro . Continue amlodipine .  Hypothyroidism: Continue Synthroid.  Thrombocytosis: Likely reactive.  Memory impairment: Continue donezepil.   DVT prophylaxis: lovenox  Family Communication:none Status is: Inpatient Remains inpatient appropriate because: Lumbar disc herniation    Code Status:     Code Status Orders  (From  admission, onward)           Start     Ordered   06/11/23 1351  Do not attempt resuscitation (DNR) Pre-Arrest Interventions Desired  (Code Status)  Continuous       Question Answer Comment  If pulseless and not breathing No CPR or chest compressions.   In Pre-Arrest Conditions (Patient Has Pulse and Is Breathing) May intubate, use advanced airway interventions and cardioversion/ACLS medications if appropriate or indicated. May transfer to ICU.   Consent: Discussion documented in EHR or advanced directives reviewed      06/11/23 1350           Code Status History     Date Active Date Inactive Code Status Order ID Comments User Context   06/11/2023 0918 06/11/2023 1350 Full Code [id number]  Lena Qualia, MD ED   09/10/2020 1640 09/16/2020 2003 Full Code [id number]  Raymona Caldwell, MD ED   06/03/2019 2055 06/06/2019 1921 Full Code [id number]  Davida Espy, MD Inpatient   09/24/2017 1703 09/29/2017 1637 Full Code [id number]  Leona Rake, MD ED   05/05/2015 2030 05/06/2015 1734 Full Code [id number]  Chere Cordon, MD Inpatient      Advance Directive Documentation    Flowsheet Row Most Recent Value  Type of Advance Directive Healthcare Power of Attorney, Living will, Out of facility DNR (pink MOST or yellow form)  Pre-existing out of facility DNR order (yellow form or pink MOST form) Physician notified to receive inpatient order  "MOST" Form in Place? --         IV Access:   Peripheral IV   Procedures and diagnostic studies:   No results found.   Medical Consultants:   None.   Subjective:    Megan Pitts pain is controlled.  Objective:    Vitals:   06/14/23 1551 06/14/23 2000 06/15/23 0500 06/15/23 0822  BP: (!) 170/47 (!) 167/51 (!) 182/58 (!) 156/42  Pulse:   81 77  Resp:      Temp:  98.2 F (36.8 C) 98.4 F (36.9 C) 97.8 F (36.6 C)  TempSrc:  Oral Oral Oral  SpO2:   97% 96%  Weight:      Height:       SpO2: 96 %   Intake/Output Summary (Last  24 hours) at 06/15/2023 1019 Last data filed at 06/15/2023 0400 Gross per 24 hour  Intake 1142.98 ml  Output 1700 ml  Net -557.02 ml   Filed Weights   06/10/23 0610 06/11/23 1217  Weight: 73 kg 78.6 kg    Exam: General exam: In no acute distress. Respiratory system: Good air movement and clear to auscultation. Cardiovascular system: S1 & S2 heard, RRR. No JVD. Gastrointestinal system: Abdomen is nondistended, soft and nontender.  Extremities: No pedal edema. Skin: No rashes, lesions or ulcers Psychiatry: Judgement and insight appear normal. Mood & affect appropriate.  Data Reviewed:    Labs: Basic Metabolic Panel: Recent Labs  Lab 06/10/23 1220 06/12/23 0846 06/13/23 0609 06/14/23 0608 06/15/23 0707  NA 133* 127* 130* 131* 134*  K 4.0 4.0 3.7 3.8 3.9  CL 100 93* 100 104 105  CO2 22 22 21* 20* 19*  GLUCOSE 158* 190* 107* 106* 125*  BUN 23 26* 32* 21 12  CREATININE 1.11* 1.55* 1.67* 1.21* 0.87  CALCIUM  9.3 8.4* 7.6* 7.3* 8.0*   GFR Estimated Creatinine Clearance: 40.1 mL/min (by C-G formula based on SCr of 0.87 mg/dL). Liver Function Tests: No results for input(s): "AST", "ALT", "ALKPHOS", "BILITOT", "PROT", "ALBUMIN" in the last 168 hours. No results for input(s): "LIPASE", "AMYLASE" in the last 168 hours. No results for input(s): "AMMONIA" in the last 168 hours. Coagulation profile No results for input(s): "INR", "PROTIME" in the last 168 hours. COVID-19 Labs  No results for input(s): "DDIMER", "FERRITIN", "LDH", "CRP" in the last 72 hours.  Lab Results  Component Value Date   SARSCOV2NAA NEGATIVE 09/16/2020   SARSCOV2NAA NEGATIVE 09/10/2020   SARSCOV2NAA NEGATIVE 06/05/2019   SARSCOV2NAA NEGATIVE 06/03/2019    CBC: Recent Labs  Lab 06/10/23 1220 06/12/23 0846 06/13/23 0609 06/14/23 0608 06/15/23 0707  WBC 15.3* 12.1* 16.4* 11.4* 12.0*  HGB 15.4* 15.1* 13.0 12.8 13.7  HCT 44.8 45.3 37.9 37.6 40.1  MCV 83.7 85.3 84.8 84.7 84.8  PLT 403* 378 325 309  383   Cardiac Enzymes: No results for input(s): "CKTOTAL", "CKMB", "CKMBINDEX", "TROPONINI" in the last 168 hours. BNP (last 3 results) No results for input(s): "PROBNP" in the last 8760 hours. CBG: No results for input(s): "GLUCAP" in the last 168 hours. D-Dimer: No results for input(s): "DDIMER" in the last 72 hours. Hgb A1c: No results for input(s): "HGBA1C" in the last 72 hours. Lipid Profile: No results for input(s): "CHOL", "HDL", "LDLCALC", "TRIG", "CHOLHDL", "LDLDIRECT" in the last 72 hours. Thyroid  function studies: No results for input(s): "TSH", "T4TOTAL", "T3FREE", "THYROIDAB" in the last 72 hours.  Invalid input(s): "FREET3" Anemia work up: No results for input(s): "VITAMINB12", "FOLATE", "FERRITIN", "TIBC", "IRON", "RETICCTPCT" in the last 72 hours. Sepsis Labs: Recent Labs  Lab 06/12/23 0846 06/13/23 0609 06/14/23 0608 06/15/23 0707  WBC 12.1* 16.4* 11.4* 12.0*   Microbiology Recent Results (from the past 240 hours)  Surgical pcr screen     Status: None   Collection Time: 06/13/23  8:38  AM   Specimen: Nasal Mucosa; Nasal Swab  Result Value Ref Range Status   MRSA, PCR NEGATIVE NEGATIVE Final   Staphylococcus aureus NEGATIVE NEGATIVE Final    Comment: (NOTE) The Xpert SA Assay (FDA approved for NASAL specimens in patients 1 years of age and older), is one component of a comprehensive surveillance program. It is not intended to diagnose infection nor to guide or monitor treatment. Performed at [facility], [address] [place], [place] [address]      Medications:    diclofenac Sodium  2 g Topical Once   donepezil   10 mg Oral QHS   enoxaparin  (LOVENOX ) injection  30 mg Subcutaneous Q24H   levothyroxine  50 mcg Oral Q0600   lidocaine   1 patch Transdermal Q24H   sodium chloride  flush  3 mL Intravenous Q12H   sodium chloride  flush  3-10 mL Intravenous Q12H   Continuous Infusions:  promethazine (PHENERGAN) injection (IM or IVPB) Stopped  (06/11/23 1557)      LOS: 4 days   Macdonald Savoy  [facility]  06/15/2023, 10:19 AM

## 2023-06-15 NOTE — Progress Notes (Signed)
 Patient ID: Megan Pitts, female   DOB: Nov 02, 1927, 89 y.o.   MRN: [id number] BP (!) 156/42 (BP Location: Right Arm)   Pulse 77   Temp 97.8 F (36.6 C) (Oral)   Resp 16   Ht 5\' 3"  (1.6 m)   Wt 78.6 kg   SpO2 96%   BMI 30.70 kg/m  OR today for discetomy right L2/3, secondary to hnp at L2/3.  Alert oriented x4, speech is clear and fluent Continued pain in right lower extremity. Will take to OR today

## 2023-06-15 NOTE — Plan of Care (Signed)

## 2023-06-15 NOTE — Anesthesia Procedure Notes (Signed)
 Procedure Name: Intubation Date/Time: 06/15/2023 9:52 PM  Performed by: Shauna Del, CRNAPre-anesthesia Checklist: Patient identified, Timeout performed, Emergency Drugs available, Suction available and Patient being monitored Patient Re-evaluated:Patient Re-evaluated prior to induction Oxygen Delivery Method: Circle system utilized Preoxygenation: Pre-oxygenation with 100% oxygen Induction Type: IV induction Ventilation: Mask ventilation without difficulty and Oral airway inserted - appropriate to patient size Laryngoscope Size: Mac and 3 Grade View: Grade II Tube type: Oral Tube size: 7.0 mm Number of attempts: 1 Airway Equipment and Method: Stylet Placement Confirmation: breath sounds checked- equal and bilateral, CO2 detector, positive ETCO2 and ETT inserted through vocal cords under direct vision Secured at: 21 cm Tube secured with: Tape Dental Injury: Teeth and Oropharynx as per pre-operative assessment

## 2023-06-15 NOTE — Anesthesia Preprocedure Evaluation (Addendum)
 Anesthesia Evaluation  Patient identified by MRN, date of birth, ID band Patient awake    Reviewed: Allergy & Precautions, H&P , NPO status , Patient's Chart, lab work & pertinent test results  Airway Mallampati: II   Neck ROM: full    Dental   Pulmonary neg pulmonary ROS   breath sounds clear to auscultation       Cardiovascular hypertension, + Valvular Problems/Murmurs  Rhythm:regular Rate:Normal  S/p AVR  TTE (2020): normal EF. Prosthetic AV with normal function.   Neuro/Psych    GI/Hepatic ,GERD  ,,  Endo/Other  Hypothyroidism    Renal/GU      Musculoskeletal Right ankle fracture   Abdominal   Peds  Hematology   Anesthesia Other Findings   Reproductive/Obstetrics                             Anesthesia Physical Anesthesia Plan  ASA: 3  Anesthesia Plan: General   Post-op Pain Management: Ofirmev  IV (intra-op)*   Induction: Intravenous  PONV Risk Score and Plan: 4 or greater and Ondansetron , Dexamethasone  and Treatment may vary due to age or medical condition  Airway Management Planned: Oral ETT  Additional Equipment:   Intra-op Plan:   Post-operative Plan: Extubation in OR  Informed Consent: I have reviewed the patients History and Physical, chart, labs and discussed the procedure including the risks, benefits and alternatives for the proposed anesthesia with the patient or authorized representative who has indicated his/her understanding and acceptance.   Patient has DNR.  Discussed DNR with patient and Suspend DNR.   Dental advisory given  Plan Discussed with: CRNA, Anesthesiologist and Surgeon  Anesthesia Plan Comments:         Anesthesia Quick Evaluation

## 2023-06-16 ENCOUNTER — Other Ambulatory Visit: Payer: Self-pay

## 2023-06-16 DIAGNOSIS — M5126 Other intervertebral disc displacement, lumbar region: Secondary | ICD-10-CM | POA: Diagnosis not present

## 2023-06-16 DIAGNOSIS — E039 Hypothyroidism, unspecified: Secondary | ICD-10-CM | POA: Diagnosis not present

## 2023-06-16 DIAGNOSIS — D72829 Elevated white blood cell count, unspecified: Secondary | ICD-10-CM | POA: Diagnosis not present

## 2023-06-16 LAB — BASIC METABOLIC PANEL WITH GFR
Anion gap: 10 (ref 5–15)
BUN: 13 mg/dL (ref 8–23)
CO2: 20 mmol/L — ABNORMAL LOW (ref 22–32)
Calcium: 7.1 mg/dL — ABNORMAL LOW (ref 8.9–10.3)
Chloride: 102 mmol/L (ref 98–111)
Creatinine, Ser: 1.13 mg/dL — ABNORMAL HIGH (ref 0.44–1.00)
GFR, Estimated: 45 mL/min — ABNORMAL LOW (ref >60)
Glucose, Bld: 147 mg/dL — ABNORMAL HIGH (ref 70–99)
Potassium: 4.3 mmol/L (ref 3.5–5.1)
Sodium: 132 mmol/L — ABNORMAL LOW (ref 135–145)

## 2023-06-16 LAB — CBC
HCT: 37.3 % (ref 36.0–46.0)
Hemoglobin: 12.6 g/dL (ref 12.0–15.0)
MCH: 29.2 pg (ref 26.0–34.0)
MCHC: 33.8 g/dL (ref 30.0–36.0)
MCV: 86.3 fL (ref 80.0–100.0)
Platelets: 383 10*3/uL (ref 150–400)
RBC: 4.32 MIL/uL (ref 3.87–5.11)
RDW: 13.5 % (ref 11.5–15.5)
WBC: 11 10*3/uL — ABNORMAL HIGH (ref 4.0–10.5)
nRBC: 0 % (ref 0.0–0.2)

## 2023-06-16 MED ORDER — ACETAMINOPHEN 500 MG PO TABS
1000.0000 mg | ORAL_TABLET | Freq: Four times a day (QID) | ORAL | Status: AC
  Administered 2023-06-16 (×4): 1000 mg via ORAL
  Filled 2023-06-16 (×4): qty 2

## 2023-06-16 MED ORDER — FENTANYL CITRATE (PF) 100 MCG/2ML IJ SOLN
INTRAMUSCULAR | Status: DC | PRN
  Administered 2023-06-16: 100 ug via INTRAVENOUS

## 2023-06-16 MED ORDER — ROCURONIUM BROMIDE 10 MG/ML (PF) SYRINGE
PREFILLED_SYRINGE | INTRAVENOUS | Status: AC
  Filled 2023-06-16: qty 10

## 2023-06-16 MED ORDER — SODIUM CHLORIDE 0.9% FLUSH: 3.0000 mL | Freq: Two times a day (BID) | INTRAVENOUS | Status: DC

## 2023-06-16 MED ORDER — PHENOL 1.4 % MT LIQD: 1.0000 | OROMUCOSAL | Status: DC | PRN

## 2023-06-16 MED ORDER — DEXAMETHASONE SODIUM PHOSPHATE 10 MG/ML IJ SOLN
INTRAMUSCULAR | Status: AC
  Filled 2023-06-16: qty 1

## 2023-06-16 MED ORDER — DIAZEPAM 5 MG PO TABS: 5.0000 mg | ORAL_TABLET | Freq: Four times a day (QID) | ORAL | Status: DC | PRN

## 2023-06-16 MED ORDER — MENTHOL 3 MG MT LOZG: 1.0000 | LOZENGE | OROMUCOSAL | Status: DC | PRN

## 2023-06-16 MED ORDER — ARTIFICIAL TEARS OPHTHALMIC OINT
TOPICAL_OINTMENT | OPHTHALMIC | Status: AC
  Filled 2023-06-16: qty 7

## 2023-06-16 MED ORDER — ONDANSETRON HCL 4 MG/2ML IJ SOLN
INTRAMUSCULAR | Status: AC
  Filled 2023-06-16: qty 2

## 2023-06-16 MED ORDER — BUPIVACAINE HCL (PF) 0.5 % IJ SOLN
INTRAMUSCULAR | Status: DC | PRN
  Administered 2023-06-16: 30 mL

## 2023-06-16 MED ORDER — METHYLPREDNISOLONE ACETATE 80 MG/ML IJ SUSP
INTRAMUSCULAR | Status: DC | PRN
  Administered 2023-06-16: 80 mg

## 2023-06-16 MED ORDER — SODIUM CHLORIDE 0.9% FLUSH: 3.0000 mL | INTRAVENOUS | Status: DC | PRN

## 2023-06-16 MED ORDER — SUGAMMADEX SODIUM 200 MG/2ML IV SOLN
INTRAVENOUS | Status: DC | PRN
  Administered 2023-06-16: 160 mg via INTRAVENOUS

## 2023-06-16 MED ORDER — BISACODYL 5 MG PO TBEC: 5.0000 mg | DELAYED_RELEASE_TABLET | Freq: Every day | ORAL | Status: DC | PRN

## 2023-06-16 MED ORDER — MAGNESIUM CITRATE PO SOLN
1.0000 | Freq: Once | ORAL | Status: DC | PRN
Start: 2023-06-16 — End: 2023-06-16

## 2023-06-16 MED ORDER — SENNA 8.6 MG PO TABS
1.0000 | ORAL_TABLET | Freq: Two times a day (BID) | ORAL | Status: DC
  Administered 2023-06-16 – 2023-06-18 (×2): 8.6 mg via ORAL
  Filled 2023-06-16 (×3): qty 1

## 2023-06-16 MED ORDER — SODIUM CHLORIDE 0.9 % IV SOLN: 250.0000 mL | INTRAVENOUS | Status: DC

## 2023-06-16 MED ORDER — SENNOSIDES-DOCUSATE SODIUM 8.6-50 MG PO TABS: 1.0000 | ORAL_TABLET | Freq: Every evening | ORAL | Status: DC | PRN

## 2023-06-16 MED ORDER — SODIUM CHLORIDE 0.9 % IV SOLN: INTRAVENOUS | Status: AC

## 2023-06-16 MED ORDER — ONDANSETRON HCL 4 MG/2ML IJ SOLN
INTRAMUSCULAR | Status: DC | PRN
  Administered 2023-06-16: 4 mg via INTRAVENOUS

## 2023-06-16 NOTE — Plan of Care (Signed)

## 2023-06-16 NOTE — Progress Notes (Signed)
 Occupational Therapy Treatment Patient Details Name: Megan Pitts MRN: [id number] DOB: 06-14-1927 Today's Date: 06/16/2023   History of present illness Pt is a 89 y/o F admitted on 06/10/23 after presenting with c/o RLE giving out & causing her to fall. MRI shows disc eccentric to the right at L2/3. Underwent L2-3 laminectomy and microdiscectomy 6/6.  PMH: a-fib, aortic stenosis, GERD, HLD, HTN, shingles, vertigo   OT comments  Pt making excellent progress. Able to ambulate @ 30 ft @ RW level and requires mod A with LB ADL. Pt with the ability to recall precautions however requires cues to follow. Written handout provided. Patient will benefit from continued inpatient follow up therapy, <3 hours/day to maximize functional level of independence with goal to return home. Acute OT to follow.       If plan is discharge home, recommend the following:  A lot of help with walking and/or transfers;A lot of help with bathing/dressing/bathroom   Equipment Recommendations  Other (comment);BSC/3in1    Recommendations for Other Services      Precautions / Restrictions Precautions Precautions: Fall;Back Precaution Booklet Issued: Yes (comment) Restrictions Weight Bearing Restrictions Per Provider Order: No       Mobility Bed Mobility               General bed mobility comments: OOB in chair    Transfers Overall transfer level: Needs assistance Equipment used: Rolling walker (2 wheels) Transfers: Sit to/from Stand Sit to Stand: Min assist              Balance Overall balance assessment: Needs assistance Sitting-balance support: Feet supported Sitting balance-Leahy Scale: Fair Sitting balance - Comments: supervision static sitting   Standing balance support: Bilateral upper extremity supported, Reliant on assistive device for balance, During functional activity Standing balance-Leahy Scale: Poor Standing balance comment: Reliant on RW                            ADL either performed or assessed with clinical judgement   ADL Overall ADL's : Needs assistance/impaired Eating/Feeding: Set up;Sitting   Grooming: Set up;Sitting;Supervision/safety   Upper Body Bathing: Set up;Sitting;Supervision/ safety   Lower Body Bathing: Moderate assistance;Sit to/from stand   Upper Body Dressing : Set up;Sitting;Supervision/safety   Lower Body Dressing: Moderate assistance;Sit to/from stand Lower Body Dressing Details (indicate cue type and reason): Pt unable to figure 4 RLE to maintain back precautions insitting but able to bring into figure four position when legs elevated in recliner     Toileting- Clothing Manipulation and Hygiene: Moderate assistance;Sit to/from stand Toileting - Clothing Manipulation Details (indicate cue type and reason): Assist to maintain standing balance       General ADL Comments: Better understanding of back precautions this session   Extremity/Trunk Assessment Upper Extremity Assessment Upper Extremity Assessment: Overall WFL for tasks assessed   Lower Extremity Assessment Lower Extremity Assessment: Defer to PT evaluation  Hx of L LE buckling      Vision   Vision Assessment?: No apparent visual deficits   Perception     Praxis     Communication Communication Communication: Impaired Factors Affecting Communication: Hearing impaired   Cognition Arousal: Alert Behavior During Therapy: WFL for tasks assessed/performed, Anxious Cognition: History of cognitive impairments             OT - Cognition Comments: appears at baseline; good recall of information while talking to family members over the phone  Following commands: Impaired Following commands impaired: Only follows one step commands consistently      Cueing   Cueing Techniques: Verbal cues, Visual cues, Gestural cues, Tactile cues  Exercises      Shoulder Instructions       General Comments hx of neck fx which affected  her gait "I've never walked right since"    Pertinent Vitals/ Pain       Pain Assessment Pain Assessment: No/denies pain  Home Living                                          Prior Functioning/Environment              Frequency  Min 2X/week        Progress Toward Goals  OT Goals(current goals can now be found in the care plan section)  Progress towards OT goals: Progressing toward goals  Acute Rehab OT Goals Patient Stated Goal: get better OT Goal Formulation: With patient Time For Goal Achievement: 06/29/23 Potential to Achieve Goals: Good ADL Goals Pt Will Perform Lower Body Dressing: with min assist;sit to/from stand Pt Will Transfer to Toilet: with min assist;ambulating Pt Will Perform Toileting - Clothing Manipulation and hygiene: with min assist;sit to/from stand  Plan      Co-evaluation                 AM-PAC OT "6 Clicks" Daily Activity     Outcome Measure   Help from another person eating meals?: None Help from another person taking care of personal grooming?: A Little Help from another person toileting, which includes using toliet, bedpan, or urinal?: A Lot Help from another person bathing (including washing, rinsing, drying)?: A Lot Help from another person to put on and taking off regular upper body clothing?: A Little Help from another person to put on and taking off regular lower body clothing?: A Lot 6 Click Score: 16    End of Session Equipment Utilized During Treatment: Gait belt;Rolling walker (2 wheels)  OT Visit Diagnosis: Unsteadiness on feet (R26.81);Other abnormalities of gait and mobility (R26.89);Muscle weakness (generalized) (M62.81)   Activity Tolerance Patient tolerated treatment well   Patient Left with call bell/phone within reach;in chair;with chair alarm set   Nurse Communication Mobility status;Precautions        Time: 1610-9604 OT Time Calculation (min): 17 min  Charges: OT General  Charges $OT Visit: 1 Visit OT Treatments $Self Care/Home Management : 8-22 mins  Milburn Aliment, OT/L   Acute OT Clinical Specialist Acute Rehabilitation Services Pager [phone] Office [phone]   [facility] 06/16/2023, 11:33 AM

## 2023-06-16 NOTE — Op Note (Signed)
 06/15/2023 - 06/16/2023  1:01 AM  PATIENT:  Megan Pitts  89 y.o. female Presented to the hospital with severe pain in the right lower extremity. MRI shows a large disc at L2/3 on the right PRE-OPERATIVE DIAGNOSIS:  Displacement of lumbar intervertebral disc without myelopathy  POST-OPERATIVE DIAGNOSIS:  Displacement of lumbar intervertebral disc without myelopathy  PROCEDURE:  Procedure(s): LUMBAR LAMINECTOMY MICRODISCECTOMY LUMBAR TWO-THREE  SURGEON:   Surgeon(s): Audie Bleacher, MD  ASSISTANTS:none  ANESTHESIA:   general  EBL:  Total I/O In: 900 [I.V.:800; IV Piggyback:100] Out: 50 [Blood:50]  BLOOD ADMINISTERED:none  CELL SAVER GIVEN:none, not used  COUNT:per nursing  DRAINS: none   SPECIMEN:  No Specimen  DICTATION: Megan Pitts was taken to the operating room, intubated and placed under a general anesthetic without difficulty. She was positioned prone on a Wilson frame with all pressure points padded. Her back was prepped and draped in a sterile manner. I opened the skin with a 10 blade and carried the dissection down to the thoracolumbar fascia. I used both sharp dissection and the monopolar cautery to expose the lamina of L2, and L3. I confirmed my location with an intraoperative xray.  I used the drill, Kerrison punches, and curettes to perform a semihemilaminectomy of L2. I used the punches to remove the ligamentum flavum to expose the thecal sac. I brought the microscope into the operative field and  started the decompression of the spinal canal, thecal sac and L2, L3 root(s). I cauterized epidural veins overlying the disc space then divided them sharply. I opened the disc space with a 15 blade and proceeded with the discectomy. I used pituitary rongeurs, curettes, and other instruments to remove disc material. After the discectomy was completed I inspected the L2 and L3 nerve root and felt it was well decompressed. I explored rostrally, laterally, medially, and caudally and was  satisfied with the decompression. I irrigated the wound, then closed in layers. I approximated the thoracolumbar fascia, subcutaneous, and subcuticular planes with vicryl sutures. I used dermabond for a sterile dressing.   PLAN OF CARE: Admit to inpatient   PATIENT DISPOSITION:  PACU - hemodynamically stable.   Delay start of Pharmacological VTE agent (>24hrs) due to surgical blood loss or risk of bleeding:  yes

## 2023-06-16 NOTE — Progress Notes (Signed)
 Upon arrival to PACU, patient had voided in bed.  Patient cleaned up and linens changed.  VSS.  Patient resting comfortably on left side.  Will continue to monitor.

## 2023-06-16 NOTE — Transfer of Care (Signed)
 Immediate Anesthesia Transfer of Care Note  Patient: Megan Pitts  Procedure(s) Performed: LUMBAR LAMINECTOMY MICRODISCECTOMY LUMBAR TWO-THREE (Back)  Patient Location: PACU  Anesthesia Type:General  Level of Consciousness: drowsy  Airway & Oxygen Therapy: Patient Spontanous Breathing and Patient connected to nasal cannula oxygen  Post-op Assessment: Report given to RN and Post -op Vital signs reviewed and stable  Post vital signs: Reviewed and stable  Last Vitals:  Vitals Value Taken Time  BP 154/48   Temp    Pulse 79 06/16/23 0056  Resp 22 06/16/23 0056  SpO2 98 % 06/16/23 0056  Vitals shown include unfiled device data.  Last Pain:  Vitals:   06/15/23 1954  TempSrc:   PainSc: 0-No pain         Complications: No notable events documented.

## 2023-06-16 NOTE — Progress Notes (Signed)
 [facility] PROGRESS NOTE    Progress Note  Megan Pitts  ZOX:[id number] DOB: 05/11/1927 DOA: 06/10/2023 PCP: Jeannine Milroy., MD     Brief Narrative:   Megan Pitts is an 89 y.o. female past medical history of essential hypertension, Paroxysmal atrial fibrillation not on anticoagulation, history of TAVR's, hypothyroidism comes in after starting having right leg weakness and inability to stand and fall, after this she has been having constant pain worse when trying to raise the leg, imaging of the right femur and lumbar spine did not reveal any acute abnormalities.  MRI of the lumbar spine shows severe left facet arthropathy at L5-S1 and a 6 mm anterior protruding synovial cyst compressing on the left L4 nerve root, central disc protrusion with compression of L2-L3.  Neurosurgery was consulted  Assessment/Plan:   Acute Lumbar disc herniation with right leg weakness: Neurosurgery  managing her pain with lidocaine  IV fentanyl  and oxycodone . The plan for discectomy, had to be delayed due to rising creatinine. See below for the details. Neurosurgery perform lumbar laminectomy with microdiscectomy on L2-L3. Started on a bowel regimen MiraLAX  p.o. twice daily. Narcotics per orthopedic surgery. PT evaluated the patient, will need skilled nursing facility.  Acute kidney injury on chronic kidney disease stage IIIa/hypovolemic hyponatremia: Baseline creatinine around 1 and admission 1.5. Started on IV fluids she has had poor oral intake.  Leucocytosis Likely reactive has remained afebrile. Improving slowly  Essential hypertension: Hold hydrochlorothiazide  Avapro . Continue amlodipine .  Hypothyroidism: Continue Synthroid .  Thrombocytosis: Likely reactive.  Memory impairment: Continue donezepil.   DVT prophylaxis: lovenox  Family Communication:none Status is: Inpatient Remains inpatient appropriate because: Lumbar disc herniation    Code Status:     Code Status  Orders  (From admission, onward)           Start     Ordered   06/11/23 1351  Do not attempt resuscitation (DNR) Pre-Arrest Interventions Desired  (Code Status)  Continuous       Question Answer Comment  If pulseless and not breathing No CPR or chest compressions.   In Pre-Arrest Conditions (Patient Has Pulse and Is Breathing) May intubate, use advanced airway interventions and cardioversion/ACLS medications if appropriate or indicated. May transfer to ICU.   Consent: Discussion documented in EHR or advanced directives reviewed      06/11/23 1350           Code Status History     Date Active Date Inactive Code Status Order ID Comments User Context   06/11/2023 0918 06/11/2023 1350 Full Code [id number]  Lena Qualia, MD ED   09/10/2020 1640 09/16/2020 2003 Full Code [id number]  Raymona Caldwell, MD ED   06/03/2019 2055 06/06/2019 1921 Full Code [id number]  Davida Espy, MD Inpatient   09/24/2017 1703 09/29/2017 1637 Full Code [id number]  Leona Rake, MD ED   05/05/2015 2030 05/06/2015 1734 Full Code [id number]  Chere Cordon, MD Inpatient      Advance Directive Documentation    Flowsheet Row Most Recent Value  Type of Advance Directive Healthcare Power of Attorney, Living will, Out of facility DNR (pink MOST or yellow form)  Pre-existing out of facility DNR order (yellow form or pink MOST form) Physician notified to receive inpatient order  "MOST" Form in Place? --         IV Access:   Peripheral IV   Procedures and diagnostic studies:   DG Lumbar Spine 1 View Result Date: 06/15/2023 CLINICAL DATA:  Lumbar surgery. EXAM: LUMBAR SPINE -  1 VIEW COMPARISON:  06/10/2023 FINDINGS: First lateral intraoperative image demonstrates posterior needle directed at the lower lumbar spine. Exact level difficult to determine. Second lateral intraoperative film appears to be directed at the L1 vertebral body. Third lateral intraoperative image demonstrates posterior surgical instruments  which appears to be at the L4-5 level. Fourth lateral intraoperative image demonstrates posterior surgical instruments directed at the L4 vertebral body, overlying the L3 spinous process. Fifth and 6 lateral intraoperative images demonstrate posterior needle directed at the L2-3 level. IMPRESSION: Intraoperative localization as above. Electronically Signed   By: Janeece Mechanic M.D.   On: 06/15/2023 23:21     Medical Consultants:   None.   Subjective:    Megan Pitts pain is controlled.  Objective:    Vitals:   06/16/23 0145 06/16/23 0204 06/16/23 0440 06/16/23 0758  BP: (!) 120/39 (!) 134/42 (!) 119/50 (!) 142/44  Pulse: (!) 59 71 73 67  Resp: (!) 9 18 18 18   Temp: 98.5 F (36.9 C) 97.7 F (36.5 C) 98.3 F (36.8 C) 97.7 F (36.5 C)  TempSrc:   Oral   SpO2: 95% 95% 95% 96%  Weight:      Height:       SpO2: 96 % O2 Flow Rate (L/min): 2 L/min   Intake/Output Summary (Last 24 hours) at 06/16/2023 0941 Last data filed at 06/16/2023 0026 Gross per 24 hour  Intake 900 ml  Output 50 ml  Net 850 ml   Filed Weights   06/10/23 0610 06/11/23 1217  Weight: 73 kg 78.6 kg    Exam: General exam: In no acute distress. Respiratory system: Good air movement and clear to auscultation. Cardiovascular system: S1 & S2 heard, RRR. No JVD. Gastrointestinal system: Abdomen is nondistended, soft and nontender.  Extremities: No pedal edema. Skin: No rashes, lesions or ulcers Psychiatry: Judgement and insight appear normal. Mood & affect appropriate.  Data Reviewed:    Labs: Basic Metabolic Panel: Recent Labs  Lab 06/12/23 0846 06/13/23 0609 06/14/23 0608 06/15/23 0707 06/16/23 0454  NA 127* 130* 131* 134* 132*  K 4.0 3.7 3.8 3.9 4.3  CL 93* 100 104 105 102  CO2 22 21* 20* 19* 20*  GLUCOSE 190* 107* 106* 125* 147*  BUN 26* 32* 21 12 13   CREATININE 1.55* 1.67* 1.21* 0.87 1.13*  CALCIUM  8.4* 7.6* 7.3* 8.0* 7.1*   GFR Estimated Creatinine Clearance: 30.9 mL/min (A) (by C-G  formula based on SCr of 1.13 mg/dL (H)). Liver Function Tests: No results for input(s): "AST", "ALT", "ALKPHOS", "BILITOT", "PROT", "ALBUMIN" in the last 168 hours. No results for input(s): "LIPASE", "AMYLASE" in the last 168 hours. No results for input(s): "AMMONIA" in the last 168 hours. Coagulation profile No results for input(s): "INR", "PROTIME" in the last 168 hours. COVID-19 Labs  No results for input(s): "DDIMER", "FERRITIN", "LDH", "CRP" in the last 72 hours.  Lab Results  Component Value Date   SARSCOV2NAA NEGATIVE 09/16/2020   SARSCOV2NAA NEGATIVE 09/10/2020   SARSCOV2NAA NEGATIVE 06/05/2019   SARSCOV2NAA NEGATIVE 06/03/2019    CBC: Recent Labs  Lab 06/12/23 0846 06/13/23 0609 06/14/23 0608 06/15/23 0707 06/16/23 0454  WBC 12.1* 16.4* 11.4* 12.0* 11.0*  HGB 15.1* 13.0 12.8 13.7 12.6  HCT 45.3 37.9 37.6 40.1 37.3  MCV 85.3 84.8 84.7 84.8 86.3  PLT 378 325 309 383 383   Cardiac Enzymes: No results for input(s): "CKTOTAL", "CKMB", "CKMBINDEX", "TROPONINI" in the last 168 hours. BNP (last 3 results) No results for input(s): "PROBNP" in  the last 8760 hours. CBG: No results for input(s): "GLUCAP" in the last 168 hours. D-Dimer: No results for input(s): "DDIMER" in the last 72 hours. Hgb A1c: No results for input(s): "HGBA1C" in the last 72 hours. Lipid Profile: No results for input(s): "CHOL", "HDL", "LDLCALC", "TRIG", "CHOLHDL", "LDLDIRECT" in the last 72 hours. Thyroid  function studies: No results for input(s): "TSH", "T4TOTAL", "T3FREE", "THYROIDAB" in the last 72 hours.  Invalid input(s): "FREET3" Anemia work up: No results for input(s): "VITAMINB12", "FOLATE", "FERRITIN", "TIBC", "IRON", "RETICCTPCT" in the last 72 hours. Sepsis Labs: Recent Labs  Lab 06/13/23 0609 06/14/23 0608 06/15/23 0707 06/16/23 0454  WBC 16.4* 11.4* 12.0* 11.0*   Microbiology Recent Results (from the past 240 hours)  Surgical pcr screen     Status: None   Collection  Time: 06/13/23  8:38 AM   Specimen: Nasal Mucosa; Nasal Swab  Result Value Ref Range Status   MRSA, PCR NEGATIVE NEGATIVE Final   Staphylococcus aureus NEGATIVE NEGATIVE Final    Comment: (NOTE) The Xpert SA Assay (FDA approved for NASAL specimens in patients 53 years of age and older), is one component of a comprehensive surveillance program. It is not intended to diagnose infection nor to guide or monitor treatment. Performed at [facility], [address] [place], [place] [address]      Medications:    acetaminophen   1,000 mg Oral Q6H   amLODipine   10 mg Oral Daily   diclofenac  Sodium  2 g Topical Once   donepezil   10 mg Oral QHS   enoxaparin  (LOVENOX ) injection  40 mg Subcutaneous Q24H   levothyroxine   50 mcg Oral Q0600   lidocaine   1 patch Transdermal Q24H   senna  1 tablet Oral BID   sodium chloride  flush  3 mL Intravenous Q12H   sodium chloride  flush  3 mL Intravenous Q12H   sodium chloride  flush  3-10 mL Intravenous Q12H   Continuous Infusions:  sodium chloride      sodium chloride      promethazine  (PHENERGAN ) injection (IM or IVPB) Stopped (06/11/23 1557)      LOS: 5 days   Macdonald Savoy  [facility]  06/16/2023, 9:41 AM

## 2023-06-16 NOTE — Progress Notes (Signed)
 Physical Therapy Treatment Patient Details Name: Megan Pitts MRN: [id number] DOB: 10-31-1927 Today's Date: 06/16/2023   History of Present Illness Pt is a 89 y/o F admitted on 06/10/23 after presenting with c/o RLE giving out & causing her to fall. MRI shows disc eccentric to the right at L2/3. Underwent L2-3 laminectomy and microdiscectomy 6/6.  PMH: a-fib, aortic stenosis, GERD, HLD, HTN, shingles, vertigo    PT Comments  Pt underwent L2/3 lami yesterday, 6/6. Excellent mobility progress noted today. She required min assist bed mobility, min assist transfers, and min assist amb 10' x 2 with RW. R knee buckling x 1 noted during 1st gait trial. Pt without c/o pain.  Pt up in recliner at end of session.    If plan is discharge home, recommend the following: A lot of help with bathing/dressing/bathroom;Assist for transportation;Assistance with cooking/housework;Help with stairs or ramp for entrance;A little help with walking and/or transfers   Can travel by private vehicle     Yes  Equipment Recommendations  None recommended by PT    Recommendations for Other Services       Precautions / Restrictions Precautions Precautions: Fall;Back Recall of Precautions/Restrictions: Intact Precaution/Restrictions Comments: educated on 3/3 back precautions for comfort Restrictions Other Position/Activity Restrictions: no brace needed     Mobility  Bed Mobility Overal bed mobility: Needs Assistance Bed Mobility: Rolling, Sidelying to Sit Rolling: Contact guard assist, Used rails Sidelying to sit: Min assist, HOB elevated, Used rails       General bed mobility comments: cues for sequencing    Transfers Overall transfer level: Needs assistance Equipment used: Rolling walker (2 wheels) Transfers: Sit to/from Stand Sit to Stand: Min assist           General transfer comment: assist to power up    Ambulation/Gait Ambulation/Gait assistance: Min assist Gait Distance (Feet): 10 Feet  (x 2) Assistive device: Rolling walker (2 wheels) Gait Pattern/deviations: Step-through pattern, Decreased stride length, Trunk flexed Gait velocity: decreased Gait velocity interpretation: <1.8 ft/sec, indicate of risk for recurrent falls   General Gait Details: R knee buckling x 1   Stairs             Wheelchair Mobility     Tilt Bed    Modified Rankin (Stroke Patients Only)       Balance Overall balance assessment: Needs assistance Sitting-balance support: Feet supported, No upper extremity supported Sitting balance-Leahy Scale: Fair     Standing balance support: Bilateral upper extremity supported, Reliant on assistive device for balance, During functional activity Standing balance-Leahy Scale: Poor                              Communication Communication Communication: Impaired Factors Affecting Communication: Hearing impaired  Cognition Arousal: Alert Behavior During Therapy: WFL for tasks assessed/performed   PT - Cognitive impairments: No apparent impairments                         Following commands: Impaired Following commands impaired: Only follows one step commands consistently    Cueing Cueing Techniques: Verbal cues, Visual cues, Gestural cues, Tactile cues  Exercises      General Comments General comments (skin integrity, edema, etc.): hx of neck fx which affected her gait "I've never walked right since"      Pertinent Vitals/Pain Pain Assessment Pain Assessment: No/denies pain    Home Living  Prior Function            PT Goals (current goals can now be found in the care plan section) Acute Rehab PT Goals Patient Stated Goal: home Progress towards PT goals: Progressing toward goals    Frequency    Min 2X/week      PT Plan      Co-evaluation              AM-PAC PT "6 Clicks" Mobility   Outcome Measure  Help needed turning from your back to your side  while in a flat bed without using bedrails?: A Little Help needed moving from lying on your back to sitting on the side of a flat bed without using bedrails?: A Little Help needed moving to and from a bed to a chair (including a wheelchair)?: A Little Help needed standing up from a chair using your arms (e.g., wheelchair or bedside chair)?: A Little Help needed to walk in hospital room?: A Little Help needed climbing 3-5 steps with a railing? : Total 6 Click Score: 16    End of Session Equipment Utilized During Treatment: Gait belt Activity Tolerance: Patient tolerated treatment well Patient left: in chair;with chair alarm set;with family/visitor present;with call bell/phone within reach Nurse Communication: Mobility status PT Visit Diagnosis: Difficulty in walking, not elsewhere classified (R26.2);Muscle weakness (generalized) (M62.81);Other abnormalities of gait and mobility (R26.89)     Time: 8295-6213 PT Time Calculation (min) (ACUTE ONLY): 24 min  Charges:    $Gait Training: 23-37 mins PT General Charges $$ ACUTE PT VISIT: 1 Visit                     Megan Pitts., PT  Office # [phone]    Megan Pitts 06/16/2023, 12:32 PM

## 2023-06-16 NOTE — Progress Notes (Signed)
 Subjective: The patient is alert and pleasant.  She is hard of hearing.  Her daughter is at the bedside.  She feels much better after surgery.  She has not ambulated much.  The plan is for rehab on Monday.  Objective: Vital signs in last 24 hours: Temp:  [97.7 F (36.5 C)-98.5 F (36.9 C)] 97.7 F (36.5 C) (06/07 0758) Pulse Rate:  [59-81] 67 (06/07 0758) Resp:  [0-21] 18 (06/07 0758) BP: (119-161)/(35-64) 142/44 (06/07 0758) SpO2:  [92 %-96 %] 96 % (06/07 0758) Estimated body mass index is 30.7 kg/m as calculated from the following:   Height as of this encounter: 5\' 3"  (1.6 m).   Weight as of this encounter: 78.6 kg.   Intake/Output from previous day: 06/06 0701 - 06/07 0700 In: 900 [I.V.:800; IV Piggyback:100] Out: 50 [Blood:50] Intake/Output this shift: No intake/output data recorded.  Physical exam the patient is alert and pleasant.  She is moving her lower extremities well.  Lab Results: Recent Labs    06/15/23 0707 06/16/23 0454  WBC 12.0* 11.0*  HGB 13.7 12.6  HCT 40.1 37.3  PLT 383 383   BMET Recent Labs    06/15/23 0707 06/16/23 0454  NA 134* 132*  K 3.9 4.3  CL 105 102  CO2 19* 20*  GLUCOSE 125* 147*  BUN 12 13  CREATININE 0.87 1.13*  CALCIUM  8.0* 7.1*    Studies/Results: DG Lumbar Spine 1 View Result Date: 06/15/2023 CLINICAL DATA:  Lumbar surgery. EXAM: LUMBAR SPINE - 1 VIEW COMPARISON:  06/10/2023 FINDINGS: First lateral intraoperative image demonstrates posterior needle directed at the lower lumbar spine. Exact level difficult to determine. Second lateral intraoperative film appears to be directed at the L1 vertebral body. Third lateral intraoperative image demonstrates posterior surgical instruments which appears to be at the L4-5 level. Fourth lateral intraoperative image demonstrates posterior surgical instruments directed at the L4 vertebral body, overlying the L3 spinous process. Fifth and 6 lateral intraoperative images demonstrate posterior  needle directed at the L2-3 level. IMPRESSION: Intraoperative localization as above. Electronically Signed   By: Janeece Mechanic M.D.   On: 06/15/2023 23:21    Assessment/Plan: Postop day #0: The patient is feeling better since surgery.  The plan is rehab on Monday.  I have answered all questions.  LOS: 5 days     Megan Pitts 06/16/2023, 10:56 AM     Patient ID: Megan Pitts, female   DOB: 14-Feb-1927, 89 y.o.   MRN: [id number]

## 2023-06-17 DIAGNOSIS — M5126 Other intervertebral disc displacement, lumbar region: Secondary | ICD-10-CM | POA: Diagnosis not present

## 2023-06-17 LAB — CBC
HCT: 33.9 % — ABNORMAL LOW (ref 36.0–46.0)
Hemoglobin: 11.2 g/dL — ABNORMAL LOW (ref 12.0–15.0)
MCH: 28.4 pg (ref 26.0–34.0)
MCHC: 33 g/dL (ref 30.0–36.0)
MCV: 86 fL (ref 80.0–100.0)
Platelets: 343 10*3/uL (ref 150–400)
RBC: 3.94 MIL/uL (ref 3.87–5.11)
RDW: 13.5 % (ref 11.5–15.5)
WBC: 11.6 10*3/uL — ABNORMAL HIGH (ref 4.0–10.5)
nRBC: 0 % (ref 0.0–0.2)

## 2023-06-17 LAB — BASIC METABOLIC PANEL WITH GFR
Anion gap: 9 (ref 5–15)
BUN: 20 mg/dL (ref 8–23)
CO2: 19 mmol/L — ABNORMAL LOW (ref 22–32)
Calcium: 7.4 mg/dL — ABNORMAL LOW (ref 8.9–10.3)
Chloride: 103 mmol/L (ref 98–111)
Creatinine, Ser: 1.06 mg/dL — ABNORMAL HIGH (ref 0.44–1.00)
GFR, Estimated: 49 mL/min — ABNORMAL LOW (ref >60)
Glucose, Bld: 140 mg/dL — ABNORMAL HIGH (ref 70–99)
Potassium: 3.9 mmol/L (ref 3.5–5.1)
Sodium: 131 mmol/L — ABNORMAL LOW (ref 135–145)

## 2023-06-17 MED ORDER — IRBESARTAN 75 MG PO TABS
37.5000 mg | ORAL_TABLET | Freq: Every day | ORAL | Status: DC
  Administered 2023-06-17 – 2023-06-18 (×2): 37.5 mg via ORAL
  Filled 2023-06-17 (×2): qty 0.5

## 2023-06-17 NOTE — Progress Notes (Signed)
 [facility] PROGRESS NOTE    Progress Note  Megan Pitts  UJW:[id number] DOB: 1927/12/24 DOA: 06/10/2023 PCP: Jeannine Milroy., MD     Brief Narrative:   Megan Pitts is an 89 y.o. female past medical history of essential hypertension, Paroxysmal atrial fibrillation not on anticoagulation, history of TAVR's, hypothyroidism comes in after starting having right leg weakness and inability to stand and fall, after this she has been having constant pain worse when trying to raise the leg, imaging of the right femur and lumbar spine did not reveal any acute abnormalities.  MRI of the lumbar spine shows severe left facet arthropathy at L5-S1 and a 6 mm anterior protruding synovial cyst compressing on the left L4 nerve root, central disc protrusion with compression of L2-L3.  Neurosurgery was consulted  Assessment/Plan:   Acute Lumbar disc herniation with right leg weakness: Neurosurgery  managing her pain with lidocaine  IV fentanyl  and oxycodone . Neurosurgery perform lumbar laminectomy with microdiscectomy on L2-L3 06/15/2023.. Narcotics per Neuro surgery. PT evaluated the patient, evaluated by inpatient rehab Monday  Acute kidney injury on chronic kidney disease stage IIIa/hypovolemic hyponatremia: Baseline creatinine around 1 and admission 1.5. Return to baseline.  Leucocytosis Resolved  Essential hypertension: Hold hydrochlorothiazide  Avapro . Continue amlodipine .  Hypothyroidism: Continue Synthroid .  Thrombocytosis: Likely reactive.  Memory impairment: Continue donezepil.   DVT prophylaxis: lovenox  Family Communication:none Status is: Inpatient Remains inpatient appropriate because: Lumbar disc herniation    Code Status:     Code Status Orders  (From admission, onward)           Start     Ordered   06/11/23 1351  Do not attempt resuscitation (DNR) Pre-Arrest Interventions Desired  (Code Status)  Continuous       Question Answer Comment  If pulseless and  not breathing No CPR or chest compressions.   In Pre-Arrest Conditions (Patient Has Pulse and Is Breathing) May intubate, use advanced airway interventions and cardioversion/ACLS medications if appropriate or indicated. May transfer to ICU.   Consent: Discussion documented in EHR or advanced directives reviewed      06/11/23 1350           Code Status History     Date Active Date Inactive Code Status Order ID Comments User Context   06/11/2023 0918 06/11/2023 1350 Full Code [id number]  Lena Qualia, MD ED   09/10/2020 1640 09/16/2020 2003 Full Code [id number]  Raymona Caldwell, MD ED   06/03/2019 2055 06/06/2019 1921 Full Code [id number]  Davida Espy, MD Inpatient   09/24/2017 1703 09/29/2017 1637 Full Code [id number]  Leona Rake, MD ED   05/05/2015 2030 05/06/2015 1734 Full Code [id number]  Chere Cordon, MD Inpatient      Advance Directive Documentation    Flowsheet Row Most Recent Value  Type of Advance Directive Healthcare Power of Attorney, Living will, Out of facility DNR (pink MOST or yellow form)  Pre-existing out of facility DNR order (yellow form or pink MOST form) Physician notified to receive inpatient order  "MOST" Form in Place? --         IV Access:   Peripheral IV   Procedures and diagnostic studies:   DG Lumbar Spine 1 View Result Date: 06/15/2023 CLINICAL DATA:  Lumbar surgery. EXAM: LUMBAR SPINE - 1 VIEW COMPARISON:  06/10/2023 FINDINGS: First lateral intraoperative image demonstrates posterior needle directed at the lower lumbar spine. Exact level difficult to determine. Second lateral intraoperative film appears to be directed at the L1 vertebral body.  Third lateral intraoperative image demonstrates posterior surgical instruments which appears to be at the L4-5 level. Fourth lateral intraoperative image demonstrates posterior surgical instruments directed at the L4 vertebral body, overlying the L3 spinous process. Fifth and 6 lateral intraoperative images  demonstrate posterior needle directed at the L2-3 level. IMPRESSION: Intraoperative localization as above. Electronically Signed   By: Janeece Mechanic M.D.   On: 06/15/2023 23:21     Medical Consultants:   None.   Subjective:    Megan Pitts no further pain.  Objective:    Vitals:   06/16/23 2346 06/17/23 0410 06/17/23 0617 06/17/23 0732  BP: (!) 152/48 (!) 146/45 115/75 (!) 163/44  Pulse: 71 78 92 80  Resp: 18 18 18    Temp: 98.1 F (36.7 C) 98.1 F (36.7 C) 98.5 F (36.9 C) 98.3 F (36.8 C)  TempSrc:    Oral  SpO2: 95% 94% 95% 95%  Weight:      Height:       SpO2: 95 % O2 Flow Rate (L/min): 2 L/min   Intake/Output Summary (Last 24 hours) at 06/17/2023 0757 Last data filed at 06/17/2023 0741 Gross per 24 hour  Intake 452.7 ml  Output --  Net 452.7 ml   Filed Weights   06/10/23 0610 06/11/23 1217  Weight: 73 kg 78.6 kg    Exam: General exam: In no acute distress. Respiratory system: Good air movement and clear to auscultation. Cardiovascular system: S1 & S2 heard, RRR. No JVD. Gastrointestinal system: Abdomen is nondistended, soft and nontender.  Extremities: No pedal edema. Skin: No rashes, lesions or ulcers Psychiatry: Judgement and insight appear normal. Mood & affect appropriate.  Data Reviewed:    Labs: Basic Metabolic Panel: Recent Labs  Lab 06/13/23 0609 06/14/23 0608 06/15/23 0707 06/16/23 0454 06/17/23 0533  NA 130* 131* 134* 132* 131*  K 3.7 3.8 3.9 4.3 3.9  CL 100 104 105 102 103  CO2 21* 20* 19* 20* 19*  GLUCOSE 107* 106* 125* 147* 140*  BUN 32* 21 12 13 20   CREATININE 1.67* 1.21* 0.87 1.13* 1.06*  CALCIUM  7.6* 7.3* 8.0* 7.1* 7.4*   GFR Estimated Creatinine Clearance: 32.9 mL/min (A) (by C-G formula based on SCr of 1.06 mg/dL (H)). Liver Function Tests: No results for input(s): "AST", "ALT", "ALKPHOS", "BILITOT", "PROT", "ALBUMIN" in the last 168 hours. No results for input(s): "LIPASE", "AMYLASE" in the last 168 hours. No results  for input(s): "AMMONIA" in the last 168 hours. Coagulation profile No results for input(s): "INR", "PROTIME" in the last 168 hours. COVID-19 Labs  No results for input(s): "DDIMER", "FERRITIN", "LDH", "CRP" in the last 72 hours.  Lab Results  Component Value Date   SARSCOV2NAA NEGATIVE 09/16/2020   SARSCOV2NAA NEGATIVE 09/10/2020   SARSCOV2NAA NEGATIVE 06/05/2019   SARSCOV2NAA NEGATIVE 06/03/2019    CBC: Recent Labs  Lab 06/13/23 0609 06/14/23 0608 06/15/23 0707 06/16/23 0454 06/17/23 0533  WBC 16.4* 11.4* 12.0* 11.0* 11.6*  HGB 13.0 12.8 13.7 12.6 11.2*  HCT 37.9 37.6 40.1 37.3 33.9*  MCV 84.8 84.7 84.8 86.3 86.0  PLT 325 309 383 383 343   Cardiac Enzymes: No results for input(s): "CKTOTAL", "CKMB", "CKMBINDEX", "TROPONINI" in the last 168 hours. BNP (last 3 results) No results for input(s): "PROBNP" in the last 8760 hours. CBG: No results for input(s): "GLUCAP" in the last 168 hours. D-Dimer: No results for input(s): "DDIMER" in the last 72 hours. Hgb A1c: No results for input(s): "HGBA1C" in the last 72 hours. Lipid Profile: No  results for input(s): "CHOL", "HDL", "LDLCALC", "TRIG", "CHOLHDL", "LDLDIRECT" in the last 72 hours. Thyroid  function studies: No results for input(s): "TSH", "T4TOTAL", "T3FREE", "THYROIDAB" in the last 72 hours.  Invalid input(s): "FREET3" Anemia work up: No results for input(s): "VITAMINB12", "FOLATE", "FERRITIN", "TIBC", "IRON", "RETICCTPCT" in the last 72 hours. Sepsis Labs: Recent Labs  Lab 06/14/23 0608 06/15/23 0707 06/16/23 0454 06/17/23 0533  WBC 11.4* 12.0* 11.0* 11.6*   Microbiology Recent Results (from the past 240 hours)  Surgical pcr screen     Status: None   Collection Time: 06/13/23  8:38 AM   Specimen: Nasal Mucosa; Nasal Swab  Result Value Ref Range Status   MRSA, PCR NEGATIVE NEGATIVE Final   Staphylococcus aureus NEGATIVE NEGATIVE Final    Comment: (NOTE) The Xpert SA Assay (FDA approved for NASAL  specimens in patients 70 years of age and older), is one component of a comprehensive surveillance program. It is not intended to diagnose infection nor to guide or monitor treatment. Performed at [facility], [address] [place], [place] [address]      Medications:    amLODipine   10 mg Oral Daily   diclofenac  Sodium  2 g Topical Once   donepezil   10 mg Oral QHS   enoxaparin  (LOVENOX ) injection  40 mg Subcutaneous Q24H   levothyroxine   50 mcg Oral Q0600   lidocaine   1 patch Transdermal Q24H   senna  1 tablet Oral BID   Continuous Infusions:  promethazine  (PHENERGAN ) injection (IM or IVPB) Stopped (06/11/23 1557)      LOS: 6 days   Macdonald Savoy  [facility]  06/17/2023, 7:57 AM

## 2023-06-17 NOTE — Plan of Care (Signed)

## 2023-06-17 NOTE — Progress Notes (Signed)
   Providing Compassionate, Quality Care - Together   Subjective: Patient with no issues overnight. Reports her pain is much improved from prior to surgery.  Objective: Vital signs in last 24 hours: Temp:  [97.9 F (36.6 C)-98.5 F (36.9 C)] 98.3 F (36.8 C) (06/08 0732) Pulse Rate:  [71-92] 80 (06/08 0732) Resp:  [17-18] 18 (06/08 0617) BP: (115-163)/(44-75) 163/44 (06/08 0732) SpO2:  [94 %-96 %] 95 % (06/08 0842)  Intake/Output from previous day: 06/07 0701 - 06/08 0700 In: 332.7 [I.V.:332.7] Out: -  Intake/Output this shift: Total I/O In: 120 [P.O.:120] Out: -   Alert and oriented PERRLA Speech clear MAE Incision is covered with Honeycomb dressing. Dressing is clean, dry, and intact.  Lab Results: Recent Labs    06/16/23 0454 06/17/23 0533  WBC 11.0* 11.6*  HGB 12.6 11.2*  HCT 37.3 33.9*  PLT 383 343   BMET Recent Labs    06/16/23 0454 06/17/23 0533  NA 132* 131*  K 4.3 3.9  CL 102 103  CO2 20* 19*  GLUCOSE 147* 140*  BUN 13 20  CREATININE 1.13* 1.06*  CALCIUM  7.1* 7.4*    Studies/Results: DG Lumbar Spine 1 View Result Date: 06/15/2023 CLINICAL DATA:  Lumbar surgery. EXAM: LUMBAR SPINE - 1 VIEW COMPARISON:  06/10/2023 FINDINGS: First lateral intraoperative image demonstrates posterior needle directed at the lower lumbar spine. Exact level difficult to determine. Second lateral intraoperative film appears to be directed at the L1 vertebral body. Third lateral intraoperative image demonstrates posterior surgical instruments which appears to be at the L4-5 level. Fourth lateral intraoperative image demonstrates posterior surgical instruments directed at the L4 vertebral body, overlying the L3 spinous process. Fifth and 6 lateral intraoperative images demonstrate posterior needle directed at the L2-3 level. IMPRESSION: Intraoperative localization as above. Electronically Signed   By: Janeece Mechanic M.D.   On: 06/15/2023 23:21    Assessment/Plan: Patient  underwent L2-3 laminectomy/discectomy by Dr. Michale Age on 06/16/2023 in the early morning.   LOS: 6 days   -Plan is for discharge to [facility] for further rehabilitation. -Patient is ready for discharge from a Neurosurgical perspective.  I am in communication with my attending and they agree with the plan for this patient.   Henreitta Locus, DNP, AGNP-C Nurse Practitioner  [facility] [address], [address], [place], [place] [address] P: [phone]    F: [phone]  06/17/2023, 1:31 PM

## 2023-06-17 NOTE — Anesthesia Postprocedure Evaluation (Signed)
 Anesthesia Post Note  Patient: Megan Pitts  Procedure(s) Performed: LUMBAR LAMINECTOMY MICRODISCECTOMY LUMBAR TWO-THREE (Back)     Patient location during evaluation: PACU Anesthesia Type: General Level of consciousness: awake and alert Pain management: pain level controlled Vital Signs Assessment: post-procedure vital signs reviewed and stable Respiratory status: spontaneous breathing, nonlabored ventilation, respiratory function stable and patient connected to nasal cannula oxygen Cardiovascular status: blood pressure returned to baseline and stable Postop Assessment: no apparent nausea or vomiting Anesthetic complications: no   No notable events documented.  Last Vitals:  Vitals:   06/17/23 0617 06/17/23 0732  BP: 115/75 (!) 163/44  Pulse: 92 80  Resp: 18   Temp: 36.9 C 36.8 C  SpO2: 95% 95%    Last Pain:  Vitals:   06/17/23 0800  TempSrc:   PainSc: 0-No pain                 Kesa Birky S

## 2023-06-18 ENCOUNTER — Encounter (HOSPITAL_COMMUNITY): Payer: Self-pay | Admitting: Neurosurgery

## 2023-06-18 DIAGNOSIS — R2681 Unsteadiness on feet: Secondary | ICD-10-CM | POA: Diagnosis not present

## 2023-06-18 DIAGNOSIS — I480 Paroxysmal atrial fibrillation: Secondary | ICD-10-CM | POA: Diagnosis not present

## 2023-06-18 DIAGNOSIS — M545 Low back pain, unspecified: Secondary | ICD-10-CM | POA: Diagnosis not present

## 2023-06-18 DIAGNOSIS — M19071 Primary osteoarthritis, right ankle and foot: Secondary | ICD-10-CM | POA: Diagnosis not present

## 2023-06-18 DIAGNOSIS — R413 Other amnesia: Secondary | ICD-10-CM | POA: Diagnosis not present

## 2023-06-18 DIAGNOSIS — E039 Hypothyroidism, unspecified: Secondary | ICD-10-CM | POA: Diagnosis not present

## 2023-06-18 DIAGNOSIS — Z9889 Other specified postprocedural states: Secondary | ICD-10-CM | POA: Diagnosis not present

## 2023-06-18 DIAGNOSIS — F068 Other specified mental disorders due to known physiological condition: Secondary | ICD-10-CM | POA: Diagnosis not present

## 2023-06-18 DIAGNOSIS — M5126 Other intervertebral disc displacement, lumbar region: Secondary | ICD-10-CM | POA: Diagnosis not present

## 2023-06-18 DIAGNOSIS — R2689 Other abnormalities of gait and mobility: Secondary | ICD-10-CM | POA: Diagnosis not present

## 2023-06-18 DIAGNOSIS — R278 Other lack of coordination: Secondary | ICD-10-CM | POA: Diagnosis not present

## 2023-06-18 DIAGNOSIS — G4700 Insomnia, unspecified: Secondary | ICD-10-CM | POA: Diagnosis not present

## 2023-06-18 DIAGNOSIS — N183 Chronic kidney disease, stage 3 unspecified: Secondary | ICD-10-CM | POA: Diagnosis not present

## 2023-06-18 DIAGNOSIS — M6281 Muscle weakness (generalized): Secondary | ICD-10-CM | POA: Diagnosis not present

## 2023-06-18 DIAGNOSIS — D72829 Elevated white blood cell count, unspecified: Secondary | ICD-10-CM | POA: Diagnosis not present

## 2023-06-18 DIAGNOSIS — I10 Essential (primary) hypertension: Secondary | ICD-10-CM | POA: Diagnosis not present

## 2023-06-18 DIAGNOSIS — M79661 Pain in right lower leg: Secondary | ICD-10-CM | POA: Diagnosis not present

## 2023-06-18 DIAGNOSIS — R52 Pain, unspecified: Secondary | ICD-10-CM | POA: Diagnosis not present

## 2023-06-18 DIAGNOSIS — D75839 Thrombocytosis, unspecified: Secondary | ICD-10-CM | POA: Diagnosis not present

## 2023-06-18 NOTE — TOC Transition Note (Signed)
 Transition of Care [facility]) - Discharge Note   Patient Details  Name: Megan Pitts MRN: [id number] Date of Birth: 1927/03/09  [facility]) CM/SW Contact:  Elspeth Hals, LCSW Phone Number: 06/18/2023, 11:15 AM   Clinical Narrative:   Pt discharging to [facility].  RN call [phone] for report.   Daughter Russ Course will transport and will need pt brought down to main north tower entrance with assistance getting into the vehicle.     Final next level of care: Skilled Nursing Facility Barriers to Discharge: Barriers Resolved   Patient Goals and CMS Choice Patient states their goals for this hospitalization and ongoing recovery are:: back to normal activities: walking socializing   Choice offered to / list presented to : Patient (pt requesting [facility])      Discharge Placement              Patient chooses bed at: [facility] Patient to be transferred to facility by: daughter Russ Course Name of family member notified: daughter Russ Course Patient and family notified of of transfer: 06/18/23  Discharge Plan and Services Additional resources added to the After Visit Summary for   In-house Referral: Clinical Social Work   Post Acute Care Choice: Skilled Nursing Facility                               Social Drivers of Health (SDOH) Interventions SDOH Screenings   Food Insecurity: No Food Insecurity (06/11/2023)  Housing: Low Risk  (06/11/2023)  Transportation Needs: No Transportation Needs (06/11/2023)  Utilities: Not At Risk (06/11/2023)  Alcohol  Screen: Low Risk  (06/30/2019)  Depression (PHQ2-9): Low Risk  (12/08/2020)  Financial Resource Strain: Low Risk  (06/30/2019)  Physical Activity: Inactive (06/30/2019)  Social Connections: Moderately Isolated (06/11/2023)  Stress: No Stress Concern Present (06/30/2019)  Tobacco Use: Low Risk  (06/15/2023)     Readmission Risk Interventions     No data to display

## 2023-06-18 NOTE — TOC Progression Note (Signed)
 Transition of Care [facility]) - Progression Note    Patient Details  Name: Megan Pitts MRN: [id number] Date of Birth: [birth year]  [facility] of [facility]) CM/SW Contact  Elspeth Hals, LCSW Phone Number: 06/18/2023, 11:12 AM  Clinical Narrative:   Message left with Brittany/[facility] to confirm she can receive pt today.  1100: [place] confirms can receive pt.  CSW spoke with [name], updated her on DC today.  Discussed transportation: Russ Course is able to transport to [facility] [employer].    Expected Discharge Plan: Skilled Nursing Facility Barriers to Discharge: Continued Medical Work up, SNF Pending bed offer  Expected Discharge Plan and Services In-house Referral: Clinical Social Work   Post Acute Care Choice: Skilled Nursing Facility Living arrangements for the past 2 months: Single Family Home Expected Discharge Date: 06/18/23                                     Social Determinants of Health (SDOH) Interventions SDOH Screenings   Food Insecurity: No Food Insecurity (06/11/2023)  Housing: Low Risk  (06/11/2023)  Transportation Needs: No Transportation Needs (06/11/2023)  Utilities: Not At Risk (06/11/2023)  Alcohol  Screen: Low Risk  (06/30/2019)  Depression (PHQ2-9): Low Risk  (12/08/2020)  Financial Resource Strain: Low Risk  (06/30/2019)  Physical Activity: Inactive (06/30/2019)  Social Connections: Moderately Isolated (06/11/2023)  Stress: No Stress Concern Present (06/30/2019)  Tobacco Use: Low Risk  (06/15/2023)    Readmission Risk Interventions     No data to display

## 2023-06-18 NOTE — Progress Notes (Signed)
 Patient discharging to snf [facility] attempted x3 to call to give report and no answer daughter will transport patient to snf.

## 2023-06-18 NOTE — Discharge Summary (Signed)
 Physician Discharge Summary  Megan Pitts BJY:[id number] DOB: Jul 02, 1927 DOA: 06/10/2023  PCP: Jeannine Milroy., MD  Admit date: 06/10/2023 Discharge date: 06/18/2023  Admitted From: Home Disposition:  SNF  Recommendations for Outpatient Follow-up:  Follow up with PCP in 1-2 weeks Please obtain BMP/CBC in one week   Home Health:No Equipment/Devices:None  Discharge Condition:Stable CODE STATUS:Full Diet recommendation: Heart Healthy  Brief/Interim Summary: 89 y.o. female past medical history of essential hypertension, Paroxysmal atrial fibrillation not on anticoagulation, history of TAVR's, hypothyroidism comes in after starting having right leg weakness and inability to stand and fall, after this she has been having constant pain worse when trying to raise the leg, imaging of the right femur and lumbar spine did not reveal any acute abnormalities.  MRI of the lumbar spine shows severe left facet arthropathy at L5-S1 and a 6 mm anterior protruding synovial cyst compressing on the left L4 nerve root, central disc protrusion with compression of L2-L3.  Neurosurgery was consulted   Discharge Diagnoses:  Principal Problem:   Lumbar disc herniation Active Problems:   Leucocytosis   Essential hypertension   Hypothyroidism   Thrombocytosis   Memory impairment   S/P AVR   CKD (chronic kidney disease), stage III (HCC)   HNP (herniated nucleus pulposus), lumbar  Acute lumbar disc herniation with right leg weakness: Neurosurgery was consulted recommended surgical intervention he had to be delayed due to acute kidney injury. Neurosurgery lumbar microdiscectomy of L4-L3 on 06/15/2023 which was successful. PT evaluated the patient recommended home health PT for  Acute injury on chronic kidney disease stage IIIa/hypovolemic hyponatremia:  Resolved with IV fluids. Creatinine returned to baseline.  Leukocytosis: Now resolved.  Essential hypertension Continue to add medication resume them  as an outpatient.  Hypothyroidism: Continue Synthroid .  Thrombocytosis: Likely reactive.  Memory impairment: Continue donezepil no changes made.  Discharge Instructions  Discharge Instructions     Diet - low sodium heart healthy   Complete by: As directed    Increase activity slowly   Complete by: As directed       Allergies as of 06/18/2023       Reactions   Synthroid  [levothyroxine  Sodium] Other (See Comments)   Unknown reaction Pt currently taking levothyroxine  50mcg QD with no issues (as of 06/11/23)   Ultram [tramadol Hcl] Other (See Comments)   Unknown reaction        Medication List     TAKE these medications    amLODipine  10 MG tablet Commonly known as: NORVASC  Take 10 mg by mouth daily.   donepezil  10 MG tablet Commonly known as: ARICEPT  Take 10 mg by mouth at bedtime.   hydrochlorothiazide  25 MG tablet Commonly known as: HYDRODIURIL  Take 25 mg by mouth daily.   levothyroxine  50 MCG tablet Commonly known as: SYNTHROID  Take 50 mcg by mouth daily.   olmesartan 40 MG tablet Commonly known as: BENICAR Take 40 mg by mouth daily.   Vitamin D  (Ergocalciferol ) 1.25 MG (50000 UNIT) Caps capsule Commonly known as: DRISDOL  Take 50,000 Units by mouth every Wednesday. On Sundays        Allergies  Allergen Reactions   Synthroid  [Levothyroxine  Sodium] Other (See Comments)    Unknown reaction  Pt currently taking levothyroxine  50mcg QD with no issues (as of 06/11/23)   Ultram [Tramadol Hcl] Other (See Comments)    Unknown reaction    Consultations: Neurosurgery   Procedures/Studies: DG Lumbar Spine 1 View Result Date: 06/15/2023 CLINICAL DATA:  Lumbar surgery. EXAM: LUMBAR SPINE -  1 VIEW COMPARISON:  06/10/2023 FINDINGS: First lateral intraoperative image demonstrates posterior needle directed at the lower lumbar spine. Exact level difficult to determine. Second lateral intraoperative film appears to be directed at the L1 vertebral body. Third  lateral intraoperative image demonstrates posterior surgical instruments which appears to be at the L4-5 level. Fourth lateral intraoperative image demonstrates posterior surgical instruments directed at the L4 vertebral body, overlying the L3 spinous process. Fifth and 6 lateral intraoperative images demonstrate posterior needle directed at the L2-3 level. IMPRESSION: Intraoperative localization as above. Electronically Signed   By: Janeece Mechanic M.D.   On: 06/15/2023 23:21   MR LUMBAR SPINE W WO CONTRAST Result Date: 06/10/2023 EXAM: MR Lumbar Spine With and Without Intravenous Contrast. 06/10/2023 02:31:18 PM TECHNIQUE: Multiplanar multisequence MRI of the lumbar spine was performed with the administration of intravenous contrast. COMPARISON: Lumbar spine radiographs 06/21/2023. CLINICAL HISTORY: Myelopathy, acute, lumbar spine. FINDINGS: BONES AND ALIGNMENT: Conventional lumbosacral anatomy is assumed with 5 non-rib-bearing, lumbar-type vertebral bodies. Modic type 1 degenerative endplate marrow edema at L3-4. 3 mm retrolisthesis of L2 on L3. SPINAL CORD: Conus terminates at L2. SOFT TISSUES: Moderate fatty atrophy of the paraspinal muscles. L1-L2: No disc herniation. No spinal canal stenosis or neural foraminal narrowing. L2-L3: Right central disc extrusion compresses the traversing right L3 nerve root in the subarticular zone. Facet arthropathy contributes to moderate right neural foraminal narrowing. L3-L4: Disc bulge and right greater than left facet arthropathy contribute to severe right and moderate left neural foraminal narrowing and mild spinal canal stenosis. L4-L5: A left eccentric disc bulge and facet arthropathy result in displacement of the traversing left L5 nerve root in the subarticular zone. Moderate bilateral neural foraminal narrowing. L5-S1: Severe left-sided facet arthropathy with 6 mm anteriorly projecting synovial cyst compresses the exiting left L5 nerve root in the neural foramen.  IMPRESSION: 1. Severe left-sided facet arthropathy at L5-S1 with a 6 mm anteriorly projecting synovial cyst compressing the exiting left L5 nerve root in the neural foramen. 2. Right central disc extrusion at L2-3 compressing the traversing right L3 nerve root in the subarticular zone, with moderate right neural foraminal narrowing. 3. Disc bulge at L3-4 with severe right and moderate left neural foraminal narrowing and mild spinal canal stenosis. 4. Left eccentric disc bulge at L4-L5 with displacement of the traversing left L5 nerve root in the subarticular zone and moderate bilateral neural foraminal narrowing. Electronically signed by: Audra Blend MD 06/10/2023 03:16 PM EDT RP Workstation: [id number]   DG Lumbar Spine Complete Result Date: 06/10/2023 CLINICAL DATA:  Low back pain beginning yesterday. EXAM: LUMBAR SPINE - COMPLETE 4+ VIEW COMPARISON:  None Available. FINDINGS: There is no evidence of lumbar spine fracture. Alignment is normal. Moderate degenerative disc disease is seen from levels of L2 to S1. Bilateral lower lumbar facet DJD is seen, worst at L5-S1. Mild levoscoliosis and generalized osteopenia noted. IMPRESSION: No acute findings. Degenerative spondylosis, as described above. Electronically Signed   By: Marlyce Sine M.D.   On: 06/10/2023 10:39   DG FEMUR 1V RIGHT Result Date: 06/10/2023 CLINICAL DATA:  Right hip pain shooting down the leg. EXAM: RIGHT FEMUR 1 VIEW COMPARISON:  None Available. FINDINGS: A single AP view is obtained in 2 films. There is normal bone mineralization for age. There is no AP evidence of femoral fracture or dislocation. No fracture or diastasis of the right hemipelvis and SI joint. The right hemisacrum unremarkable. There are no significant arthritic changes for age. Scattered calcific plaque noted in the  superficial femoral artery. Numerous pelvic phleboliths. IMPRESSION: 1. No AP evidence of femoral fracture or dislocation. 2. Atherosclerosis. Electronically  Signed   By: Denman Fischer M.D.   On: 06/10/2023 07:05    Subjective: No complaints  Discharge Exam: Vitals:   06/17/23 2010 06/18/23 0743  BP: (!) 149/52 (!) 187/53  Pulse: 75 79  Resp: 18   Temp: 98.3 F (36.8 C) 97.9 F (36.6 C)  SpO2: 95% 97%   Vitals:   06/17/23 0842 06/17/23 1341 06/17/23 2010 06/18/23 0743  BP:  (!) 154/56 (!) 149/52 (!) 187/53  Pulse:  78 75 79  Resp:   18   Temp:  97.9 F (36.6 C) 98.3 F (36.8 C) 97.9 F (36.6 C)  TempSrc:  Oral Oral Oral  SpO2: 95% 96% 95% 97%  Weight:      Height:        General: Pt is alert, awake, not in acute distress Cardiovascular: RRR, S1/S2 +, no rubs, no gallops Respiratory: CTA bilaterally, no wheezing, no rhonchi Abdominal: Soft, NT, ND, bowel sounds + Extremities: no edema, no cyanosis    The results of significant diagnostics from this hospitalization (including imaging, microbiology, ancillary and laboratory) are listed below for reference.     Microbiology: Recent Results (from the past 240 hours)  Surgical pcr screen     Status: None   Collection Time: 06/13/23  8:38 AM   Specimen: Nasal Mucosa; Nasal Swab  Result Value Ref Range Status   MRSA, PCR NEGATIVE NEGATIVE Final   Staphylococcus aureus NEGATIVE NEGATIVE Final    Comment: (NOTE) The Xpert SA Assay (FDA approved for NASAL specimens in patients 86 years of age and older), is one component of a comprehensive surveillance program. It is not intended to diagnose infection nor to guide or monitor treatment. Performed at [facility], [address], [place], [place] [address]      Labs: BNP (last 3 results) No results for input(s): "BNP" in the last 8760 hours. Basic Metabolic Panel: Recent Labs  Lab 06/13/23 0609 06/14/23 0608 06/15/23 0707 06/16/23 0454 06/17/23 0533  NA 130* 131* 134* 132* 131*  K 3.7 3.8 3.9 4.3 3.9  CL 100 104 105 102 103  CO2 21* 20* 19* 20* 19*  GLUCOSE 107* 106* 125* 147* 140*  BUN 32* 21 12 13  20   CREATININE 1.67* 1.21* 0.87 1.13* 1.06*  CALCIUM  7.6* 7.3* 8.0* 7.1* 7.4*   Liver Function Tests: No results for input(s): "AST", "ALT", "ALKPHOS", "BILITOT", "PROT", "ALBUMIN" in the last 168 hours. No results for input(s): "LIPASE", "AMYLASE" in the last 168 hours. No results for input(s): "AMMONIA" in the last 168 hours. CBC: Recent Labs  Lab 06/13/23 0609 06/14/23 0608 06/15/23 0707 06/16/23 0454 06/17/23 0533  WBC 16.4* 11.4* 12.0* 11.0* 11.6*  HGB 13.0 12.8 13.7 12.6 11.2*  HCT 37.9 37.6 40.1 37.3 33.9*  MCV 84.8 84.7 84.8 86.3 86.0  PLT 325 309 383 383 343   Cardiac Enzymes: No results for input(s): "CKTOTAL", "CKMB", "CKMBINDEX", "TROPONINI" in the last 168 hours. BNP: Invalid input(s): "POCBNP" CBG: No results for input(s): "GLUCAP" in the last 168 hours. D-Dimer No results for input(s): "DDIMER" in the last 72 hours. Hgb A1c No results for input(s): "HGBA1C" in the last 72 hours. Lipid Profile No results for input(s): "CHOL", "HDL", "LDLCALC", "TRIG", "CHOLHDL", "LDLDIRECT" in the last 72 hours. Thyroid  function studies No results for input(s): "TSH", "T4TOTAL", "T3FREE", "THYROIDAB" in the last 72 hours.  Invalid input(s): "  FREET3" Anemia work up No results for input(s): "VITAMINB12", "FOLATE", "FERRITIN", "TIBC", "IRON", "RETICCTPCT" in the last 72 hours. Urinalysis    Component Value Date/Time   COLORURINE YELLOW 06/11/2023 1352   APPEARANCEUR CLEAR 06/11/2023 1352   LABSPEC 1.019 06/11/2023 1352   PHURINE 5.0 06/11/2023 1352   GLUCOSEU NEGATIVE 06/11/2023 1352   HGBUR SMALL (A) 06/11/2023 1352   BILIRUBINUR NEGATIVE 06/11/2023 1352   KETONESUR NEGATIVE 06/11/2023 1352   PROTEINUR 100 (A) 06/11/2023 1352   NITRITE NEGATIVE 06/11/2023 1352   LEUKOCYTESUR NEGATIVE 06/11/2023 1352   Sepsis Labs Recent Labs  Lab 06/14/23 0608 06/15/23 0707 06/16/23 0454 06/17/23 0533  WBC 11.4* 12.0* 11.0* 11.6*   Microbiology Recent Results (from the past  240 hours)  Surgical pcr screen     Status: None   Collection Time: 06/13/23  8:38 AM   Specimen: Nasal Mucosa; Nasal Swab  Result Value Ref Range Status   MRSA, PCR NEGATIVE NEGATIVE Final   Staphylococcus aureus NEGATIVE NEGATIVE Final    Comment: (NOTE) The Xpert SA Assay (FDA approved for NASAL specimens in patients 66 years of age and older), is one component of a comprehensive surveillance program. It is not intended to diagnose infection nor to guide or monitor treatment. Performed at [facility], [address] [place], [place] [address]      Time coordinating discharge: Over 35 minutes  SIGNED:   Macdonald Savoy, MD  [facility] 06/18/2023, 9:12 AM Pager   If 7PM-7AM, please contact night-coverage [email or web address] Password [email or web address]

## 2023-06-19 MED FILL — Thrombin For Soln 5000 Unit: CUTANEOUS | Qty: 2 | Status: AC

## 2023-06-21 DIAGNOSIS — Z9889 Other specified postprocedural states: Secondary | ICD-10-CM | POA: Diagnosis not present

## 2023-06-21 DIAGNOSIS — I10 Essential (primary) hypertension: Secondary | ICD-10-CM | POA: Diagnosis not present

## 2023-06-21 DIAGNOSIS — E039 Hypothyroidism, unspecified: Secondary | ICD-10-CM | POA: Diagnosis not present

## 2023-06-21 DIAGNOSIS — M545 Low back pain, unspecified: Secondary | ICD-10-CM | POA: Diagnosis not present

## 2023-07-09 DIAGNOSIS — G4700 Insomnia, unspecified: Secondary | ICD-10-CM | POA: Diagnosis not present

## 2023-07-09 DIAGNOSIS — F068 Other specified mental disorders due to known physiological condition: Secondary | ICD-10-CM | POA: Diagnosis not present

## 2023-07-09 DIAGNOSIS — E039 Hypothyroidism, unspecified: Secondary | ICD-10-CM | POA: Diagnosis not present

## 2023-07-09 DIAGNOSIS — I10 Essential (primary) hypertension: Secondary | ICD-10-CM | POA: Diagnosis not present

## 2023-07-24 ENCOUNTER — Telehealth (HOSPITAL_COMMUNITY): Payer: Self-pay | Admitting: Pharmacy Technician

## 2023-07-24 NOTE — Telephone Encounter (Signed)
 Auth Submission: NO AUTH NEEDED Site of care: Site of care: [facility] Payer: Medicare A/B, BCBS Supp  Medication & CPT/J Code(s) submitted: Prolia  (Denosumab ) [id number] Diagnosis Code: M81.0 Route of submission (phone, fax, portal):  Phone # Fax # Auth type: Buy/Bill HB Units/visits requested: 60mg  x 2 doses, q 6 months Reference number:  Approval from: 07/24/2023 to 02/09/24    Medicare A/B will cover 80%, BCBS Supp will cover remaining 20%. Med will be covered at 100%.    Arlan Birks, CPhT [facility] [phone]

## 2023-08-22 DIAGNOSIS — R52 Pain, unspecified: Secondary | ICD-10-CM | POA: Diagnosis not present

## 2023-08-22 DIAGNOSIS — G4700 Insomnia, unspecified: Secondary | ICD-10-CM | POA: Diagnosis not present

## 2023-08-22 DIAGNOSIS — I10 Essential (primary) hypertension: Secondary | ICD-10-CM | POA: Diagnosis not present

## 2023-08-22 DIAGNOSIS — E039 Hypothyroidism, unspecified: Secondary | ICD-10-CM | POA: Diagnosis not present

## 2023-08-24 DIAGNOSIS — M5126 Other intervertebral disc displacement, lumbar region: Secondary | ICD-10-CM | POA: Diagnosis not present

## 2023-08-24 DIAGNOSIS — R2681 Unsteadiness on feet: Secondary | ICD-10-CM | POA: Diagnosis not present

## 2023-08-27 DIAGNOSIS — R2681 Unsteadiness on feet: Secondary | ICD-10-CM | POA: Diagnosis not present

## 2023-08-27 DIAGNOSIS — M5126 Other intervertebral disc displacement, lumbar region: Secondary | ICD-10-CM | POA: Diagnosis not present

## 2023-08-28 DIAGNOSIS — R2689 Other abnormalities of gait and mobility: Secondary | ICD-10-CM | POA: Diagnosis not present

## 2023-08-28 DIAGNOSIS — R2681 Unsteadiness on feet: Secondary | ICD-10-CM | POA: Diagnosis not present

## 2023-08-28 DIAGNOSIS — R296 Repeated falls: Secondary | ICD-10-CM | POA: Diagnosis not present

## 2023-08-28 DIAGNOSIS — R278 Other lack of coordination: Secondary | ICD-10-CM | POA: Diagnosis not present

## 2023-08-28 DIAGNOSIS — M6259 Muscle wasting and atrophy, not elsewhere classified, multiple sites: Secondary | ICD-10-CM | POA: Diagnosis not present

## 2023-08-29 DIAGNOSIS — M5126 Other intervertebral disc displacement, lumbar region: Secondary | ICD-10-CM | POA: Diagnosis not present

## 2023-08-29 DIAGNOSIS — R2681 Unsteadiness on feet: Secondary | ICD-10-CM | POA: Diagnosis not present

## 2023-08-30 DIAGNOSIS — R2681 Unsteadiness on feet: Secondary | ICD-10-CM | POA: Diagnosis not present

## 2023-08-30 DIAGNOSIS — R2689 Other abnormalities of gait and mobility: Secondary | ICD-10-CM | POA: Diagnosis not present

## 2023-08-30 DIAGNOSIS — R278 Other lack of coordination: Secondary | ICD-10-CM | POA: Diagnosis not present

## 2023-08-30 DIAGNOSIS — M6259 Muscle wasting and atrophy, not elsewhere classified, multiple sites: Secondary | ICD-10-CM | POA: Diagnosis not present

## 2023-08-30 DIAGNOSIS — R296 Repeated falls: Secondary | ICD-10-CM | POA: Diagnosis not present

## 2023-08-31 DIAGNOSIS — M5126 Other intervertebral disc displacement, lumbar region: Secondary | ICD-10-CM | POA: Diagnosis not present

## 2023-08-31 DIAGNOSIS — R2681 Unsteadiness on feet: Secondary | ICD-10-CM | POA: Diagnosis not present

## 2023-09-03 ENCOUNTER — Other Ambulatory Visit: Payer: Self-pay

## 2023-09-03 ENCOUNTER — Emergency Department (HOSPITAL_COMMUNITY)

## 2023-09-03 ENCOUNTER — Inpatient Hospital Stay (HOSPITAL_COMMUNITY)
Admission: EM | Admit: 2023-09-03 | Discharge: 2023-09-06 | DRG: 871 | Disposition: A | Discharge status: Other Acute Inpatient Hospital | Source: Ambulatory Visit | Attending: Family Medicine | Admitting: Family Medicine

## 2023-09-03 ENCOUNTER — Encounter (HOSPITAL_COMMUNITY): Payer: Self-pay | Admitting: Family Medicine

## 2023-09-03 DIAGNOSIS — A419 Sepsis, unspecified organism: Secondary | ICD-10-CM | POA: Diagnosis present

## 2023-09-03 DIAGNOSIS — I4891 Unspecified atrial fibrillation: Secondary | ICD-10-CM | POA: Diagnosis present

## 2023-09-03 DIAGNOSIS — I5032 Chronic diastolic (congestive) heart failure: Secondary | ICD-10-CM | POA: Diagnosis present

## 2023-09-03 DIAGNOSIS — R0902 Hypoxemia: Secondary | ICD-10-CM | POA: Diagnosis not present

## 2023-09-03 DIAGNOSIS — R251 Tremor, unspecified: Secondary | ICD-10-CM | POA: Diagnosis present

## 2023-09-03 DIAGNOSIS — Z8619 Personal history of other infectious and parasitic diseases: Secondary | ICD-10-CM

## 2023-09-03 DIAGNOSIS — N183 Chronic kidney disease, stage 3 unspecified: Secondary | ICD-10-CM | POA: Diagnosis present

## 2023-09-03 DIAGNOSIS — R413 Other amnesia: Secondary | ICD-10-CM | POA: Diagnosis not present

## 2023-09-03 DIAGNOSIS — Z8672 Personal history of thrombophlebitis: Secondary | ICD-10-CM | POA: Diagnosis not present

## 2023-09-03 DIAGNOSIS — M7989 Other specified soft tissue disorders: Secondary | ICD-10-CM | POA: Diagnosis present

## 2023-09-03 DIAGNOSIS — I130 Hypertensive heart and chronic kidney disease with heart failure and stage 1 through stage 4 chronic kidney disease, or unspecified chronic kidney disease: Secondary | ICD-10-CM | POA: Diagnosis present

## 2023-09-03 DIAGNOSIS — I350 Nonrheumatic aortic (valve) stenosis: Secondary | ICD-10-CM | POA: Diagnosis not present

## 2023-09-03 DIAGNOSIS — J9691 Respiratory failure, unspecified with hypoxia: Secondary | ICD-10-CM | POA: Diagnosis not present

## 2023-09-03 DIAGNOSIS — R2689 Other abnormalities of gait and mobility: Secondary | ICD-10-CM | POA: Diagnosis not present

## 2023-09-03 DIAGNOSIS — I480 Paroxysmal atrial fibrillation: Secondary | ICD-10-CM | POA: Diagnosis not present

## 2023-09-03 DIAGNOSIS — Z66 Do not resuscitate: Secondary | ICD-10-CM | POA: Diagnosis not present

## 2023-09-03 DIAGNOSIS — Z791 Long term (current) use of non-steroidal anti-inflammatories (NSAID): Secondary | ICD-10-CM

## 2023-09-03 DIAGNOSIS — Z952 Presence of prosthetic heart valve: Secondary | ICD-10-CM | POA: Diagnosis not present

## 2023-09-03 DIAGNOSIS — Z79899 Other long term (current) drug therapy: Secondary | ICD-10-CM

## 2023-09-03 DIAGNOSIS — U071 COVID-19: Secondary | ICD-10-CM | POA: Diagnosis not present

## 2023-09-03 DIAGNOSIS — D75839 Thrombocytosis, unspecified: Secondary | ICD-10-CM | POA: Diagnosis present

## 2023-09-03 DIAGNOSIS — E785 Hyperlipidemia, unspecified: Secondary | ICD-10-CM | POA: Diagnosis not present

## 2023-09-03 DIAGNOSIS — I5033 Acute on chronic diastolic (congestive) heart failure: Secondary | ICD-10-CM | POA: Diagnosis present

## 2023-09-03 DIAGNOSIS — Z90710 Acquired absence of both cervix and uterus: Secondary | ICD-10-CM | POA: Diagnosis not present

## 2023-09-03 DIAGNOSIS — Z953 Presence of xenogenic heart valve: Secondary | ICD-10-CM

## 2023-09-03 DIAGNOSIS — J189 Pneumonia, unspecified organism: Secondary | ICD-10-CM | POA: Diagnosis not present

## 2023-09-03 DIAGNOSIS — F419 Anxiety disorder, unspecified: Secondary | ICD-10-CM | POA: Diagnosis present

## 2023-09-03 DIAGNOSIS — R6520 Severe sepsis without septic shock: Secondary | ICD-10-CM | POA: Diagnosis not present

## 2023-09-03 DIAGNOSIS — K219 Gastro-esophageal reflux disease without esophagitis: Secondary | ICD-10-CM | POA: Diagnosis present

## 2023-09-03 DIAGNOSIS — N3281 Overactive bladder: Secondary | ICD-10-CM | POA: Diagnosis present

## 2023-09-03 DIAGNOSIS — R918 Other nonspecific abnormal finding of lung field: Secondary | ICD-10-CM | POA: Diagnosis not present

## 2023-09-03 DIAGNOSIS — N1831 Chronic kidney disease, stage 3a: Secondary | ICD-10-CM | POA: Diagnosis present

## 2023-09-03 DIAGNOSIS — R278 Other lack of coordination: Secondary | ICD-10-CM | POA: Diagnosis not present

## 2023-09-03 DIAGNOSIS — M6259 Muscle wasting and atrophy, not elsewhere classified, multiple sites: Secondary | ICD-10-CM | POA: Diagnosis not present

## 2023-09-03 DIAGNOSIS — E871 Hypo-osmolality and hyponatremia: Secondary | ICD-10-CM | POA: Diagnosis present

## 2023-09-03 DIAGNOSIS — R0609 Other forms of dyspnea: Secondary | ICD-10-CM | POA: Diagnosis not present

## 2023-09-03 DIAGNOSIS — Z888 Allergy status to other drugs, medicaments and biological substances status: Secondary | ICD-10-CM

## 2023-09-03 DIAGNOSIS — J9621 Acute and chronic respiratory failure with hypoxia: Secondary | ICD-10-CM | POA: Diagnosis not present

## 2023-09-03 DIAGNOSIS — R296 Repeated falls: Secondary | ICD-10-CM | POA: Diagnosis not present

## 2023-09-03 DIAGNOSIS — R051 Acute cough: Secondary | ICD-10-CM | POA: Diagnosis not present

## 2023-09-03 DIAGNOSIS — J9601 Acute respiratory failure with hypoxia: Secondary | ICD-10-CM | POA: Diagnosis present

## 2023-09-03 DIAGNOSIS — Z1152 Encounter for screening for COVID-19: Secondary | ICD-10-CM

## 2023-09-03 DIAGNOSIS — M8580 Other specified disorders of bone density and structure, unspecified site: Secondary | ICD-10-CM | POA: Diagnosis not present

## 2023-09-03 DIAGNOSIS — R29898 Other symptoms and signs involving the musculoskeletal system: Secondary | ICD-10-CM | POA: Diagnosis not present

## 2023-09-03 DIAGNOSIS — R609 Edema, unspecified: Secondary | ICD-10-CM | POA: Diagnosis not present

## 2023-09-03 DIAGNOSIS — R0602 Shortness of breath: Secondary | ICD-10-CM | POA: Diagnosis not present

## 2023-09-03 DIAGNOSIS — I5043 Acute on chronic combined systolic (congestive) and diastolic (congestive) heart failure: Secondary | ICD-10-CM | POA: Diagnosis not present

## 2023-09-03 DIAGNOSIS — J984 Other disorders of lung: Secondary | ICD-10-CM | POA: Diagnosis not present

## 2023-09-03 DIAGNOSIS — I10 Essential (primary) hypertension: Secondary | ICD-10-CM | POA: Diagnosis present

## 2023-09-03 DIAGNOSIS — R2681 Unsteadiness on feet: Secondary | ICD-10-CM | POA: Diagnosis not present

## 2023-09-03 DIAGNOSIS — I129 Hypertensive chronic kidney disease with stage 1 through stage 4 chronic kidney disease, or unspecified chronic kidney disease: Secondary | ICD-10-CM | POA: Diagnosis not present

## 2023-09-03 DIAGNOSIS — J181 Lobar pneumonia, unspecified organism: Secondary | ICD-10-CM | POA: Diagnosis not present

## 2023-09-03 DIAGNOSIS — Z79890 Hormone replacement therapy (postmenopausal): Secondary | ICD-10-CM | POA: Diagnosis not present

## 2023-09-03 DIAGNOSIS — Z7982 Long term (current) use of aspirin: Secondary | ICD-10-CM

## 2023-09-03 DIAGNOSIS — E039 Hypothyroidism, unspecified: Secondary | ICD-10-CM | POA: Diagnosis present

## 2023-09-03 DIAGNOSIS — M47816 Spondylosis without myelopathy or radiculopathy, lumbar region: Secondary | ICD-10-CM | POA: Diagnosis not present

## 2023-09-03 DIAGNOSIS — Z885 Allergy status to narcotic agent status: Secondary | ICD-10-CM

## 2023-09-03 LAB — URINALYSIS, COMPLETE (UACMP) WITH MICROSCOPIC
Bilirubin Urine: NEGATIVE
Glucose, UA: NEGATIVE mg/dL
Hgb urine dipstick: NEGATIVE
Ketones, ur: NEGATIVE mg/dL
Leukocytes,Ua: NEGATIVE
Nitrite: NEGATIVE
Protein, ur: 100 mg/dL — AB
Specific Gravity, Urine: 1.018 (ref 1.005–1.030)
pH: 6 (ref 5.0–8.0)

## 2023-09-03 LAB — CBC WITH DIFFERENTIAL/PLATELET
Abs Immature Granulocytes: 0.12 K/uL — ABNORMAL HIGH (ref 0.00–0.07)
Basophils Absolute: 0.1 K/uL (ref 0.0–0.1)
Basophils Relative: 1 %
Eosinophils Absolute: 0.2 K/uL (ref 0.0–0.5)
Eosinophils Relative: 1 %
HCT: 37.9 % (ref 36.0–46.0)
Hemoglobin: 12.8 g/dL (ref 12.0–15.0)
Immature Granulocytes: 1 %
Lymphocytes Relative: 7 %
Lymphs Abs: 1.4 K/uL (ref 0.7–4.0)
MCH: 27.9 pg (ref 26.0–34.0)
MCHC: 33.8 g/dL (ref 30.0–36.0)
MCV: 82.8 fL (ref 80.0–100.0)
Monocytes Absolute: 1.8 K/uL — ABNORMAL HIGH (ref 0.1–1.0)
Monocytes Relative: 9 %
Neutro Abs: 15.8 K/uL — ABNORMAL HIGH (ref 1.7–7.7)
Neutrophils Relative %: 81 %
Platelets: 723 K/uL — ABNORMAL HIGH (ref 150–400)
RBC: 4.58 MIL/uL (ref 3.87–5.11)
RDW: 14 % (ref 11.5–15.5)
WBC: 19.4 K/uL — ABNORMAL HIGH (ref 4.0–10.5)
nRBC: 0 % (ref 0.0–0.2)

## 2023-09-03 LAB — I-STAT CG4 LACTIC ACID, ED: Lactic Acid, Venous: 1.5 mmol/L (ref 0.5–1.9)

## 2023-09-03 LAB — RESP PANEL BY RT-PCR (RSV, FLU A&B, COVID)  RVPGX2
Influenza A by PCR: NEGATIVE
Influenza B by PCR: NEGATIVE
Resp Syncytial Virus by PCR: NEGATIVE
SARS Coronavirus 2 by RT PCR: NEGATIVE

## 2023-09-03 LAB — CREATININE, URINE, RANDOM: Creatinine, Urine: 147 mg/dL

## 2023-09-03 LAB — COMPREHENSIVE METABOLIC PANEL WITH GFR
ALT: 14 U/L (ref 0–44)
AST: 25 U/L (ref 15–41)
Albumin: 2.4 g/dL — ABNORMAL LOW (ref 3.5–5.0)
Alkaline Phosphatase: 88 U/L (ref 38–126)
Anion gap: 14 (ref 5–15)
BUN: 26 mg/dL — ABNORMAL HIGH (ref 8–23)
CO2: 18 mmol/L — ABNORMAL LOW (ref 22–32)
Calcium: 8.6 mg/dL — ABNORMAL LOW (ref 8.9–10.3)
Chloride: 97 mmol/L — ABNORMAL LOW (ref 98–111)
Creatinine, Ser: 1.22 mg/dL — ABNORMAL HIGH (ref 0.44–1.00)
GFR, Estimated: 41 mL/min — ABNORMAL LOW (ref >60)
Glucose, Bld: 136 mg/dL — ABNORMAL HIGH (ref 70–99)
Potassium: 4.6 mmol/L (ref 3.5–5.1)
Sodium: 129 mmol/L — ABNORMAL LOW (ref 135–145)
Total Bilirubin: 2 mg/dL — ABNORMAL HIGH (ref 0.0–1.2)
Total Protein: 6.5 g/dL (ref 6.5–8.1)

## 2023-09-03 LAB — BRAIN NATRIURETIC PEPTIDE: B Natriuretic Peptide: 561.6 pg/mL — ABNORMAL HIGH (ref 0.0–100.0)

## 2023-09-03 LAB — SODIUM, URINE, RANDOM: Sodium, Ur: 29 mmol/L

## 2023-09-03 LAB — PROCALCITONIN: Procalcitonin: 0.25 ng/mL

## 2023-09-03 MED ORDER — GUAIFENESIN 100 MG/5ML PO LIQD
5.0000 mL | ORAL | Status: DC | PRN
  Administered 2023-09-03: 5 mL via ORAL
  Filled 2023-09-03: qty 10

## 2023-09-03 MED ORDER — IPRATROPIUM-ALBUTEROL 0.5-2.5 (3) MG/3ML IN SOLN
3.0000 mL | Freq: Once | RESPIRATORY_TRACT | Status: AC
  Administered 2023-09-03: 3 mL via RESPIRATORY_TRACT
  Filled 2023-09-03: qty 3

## 2023-09-03 MED ORDER — IPRATROPIUM-ALBUTEROL 0.5-2.5 (3) MG/3ML IN SOLN
3.0000 mL | Freq: Four times a day (QID) | RESPIRATORY_TRACT | Status: DC | PRN
  Administered 2023-09-03 – 2023-09-04 (×2): 3 mL via RESPIRATORY_TRACT
  Filled 2023-09-03 (×2): qty 3

## 2023-09-03 MED ORDER — SODIUM CHLORIDE 0.9 % IV SOLN: 2.0000 g | INTRAVENOUS | Status: DC

## 2023-09-03 MED ORDER — ONDANSETRON HCL 4 MG PO TABS: 4.0000 mg | ORAL_TABLET | Freq: Four times a day (QID) | ORAL | Status: DC | PRN

## 2023-09-03 MED ORDER — SODIUM CHLORIDE 0.9 % IV SOLN
2.0000 g | Freq: Once | INTRAVENOUS | Status: AC
  Administered 2023-09-03: 2 g via INTRAVENOUS
  Filled 2023-09-03: qty 20

## 2023-09-03 MED ORDER — AZITHROMYCIN 250 MG PO TABS: 500.0000 mg | ORAL_TABLET | Freq: Every day | ORAL | Status: DC

## 2023-09-03 MED ORDER — SODIUM CHLORIDE 0.9 % IV SOLN
500.0000 mg | Freq: Once | INTRAVENOUS | Status: AC
  Administered 2023-09-03: 500 mg via INTRAVENOUS
  Filled 2023-09-03: qty 5

## 2023-09-03 MED ORDER — DONEPEZIL HCL 10 MG PO TABS
10.0000 mg | ORAL_TABLET | Freq: Every day | ORAL | Status: DC
  Administered 2023-09-03 – 2023-09-05 (×3): 10 mg via ORAL
  Filled 2023-09-03 (×3): qty 1

## 2023-09-03 MED ORDER — ACETAMINOPHEN 325 MG PO TABS: 650.0000 mg | ORAL_TABLET | Freq: Four times a day (QID) | ORAL | Status: DC | PRN

## 2023-09-03 MED ORDER — HEPARIN SODIUM (PORCINE) 5000 UNIT/ML IJ SOLN
5000.0000 [IU] | Freq: Three times a day (TID) | INTRAMUSCULAR | Status: DC
  Administered 2023-09-03 – 2023-09-06 (×8): 5000 [IU] via SUBCUTANEOUS
  Filled 2023-09-03 (×8): qty 1

## 2023-09-03 MED ORDER — AMLODIPINE BESYLATE 10 MG PO TABS
10.0000 mg | ORAL_TABLET | Freq: Every day | ORAL | Status: DC
  Administered 2023-09-04 – 2023-09-06 (×3): 10 mg via ORAL
  Filled 2023-09-03: qty 2
  Filled 2023-09-03 (×2): qty 1

## 2023-09-03 MED ORDER — SENNOSIDES-DOCUSATE SODIUM 8.6-50 MG PO TABS: 1.0000 | ORAL_TABLET | Freq: Every evening | ORAL | Status: DC | PRN

## 2023-09-03 MED ORDER — SODIUM CHLORIDE 0.9% FLUSH
3.0000 mL | Freq: Two times a day (BID) | INTRAVENOUS | Status: DC
  Administered 2023-09-03 – 2023-09-06 (×6): 3 mL via INTRAVENOUS

## 2023-09-03 MED ORDER — ACETAMINOPHEN 650 MG RE SUPP: 650.0000 mg | Freq: Four times a day (QID) | RECTAL | Status: DC | PRN

## 2023-09-03 MED ORDER — ONDANSETRON HCL 4 MG/2ML IJ SOLN: 4.0000 mg | Freq: Four times a day (QID) | INTRAMUSCULAR | Status: DC | PRN

## 2023-09-03 MED ORDER — LEVOTHYROXINE SODIUM 50 MCG PO TABS
50.0000 ug | ORAL_TABLET | Freq: Every day | ORAL | Status: DC
  Administered 2023-09-04 – 2023-09-06 (×3): 50 ug via ORAL
  Filled 2023-09-03: qty 2
  Filled 2023-09-03 (×2): qty 1

## 2023-09-03 NOTE — H&P (Addendum)
 History and Physical    Megan Pitts FMW:[id number] DOB: 08-17-1927 DOA: 09/03/2023  PCP: Loreli Elsie [name], MD   Patient coming from: SNF  Chief Complaint: SOB, productive cough, rhinorrhea, sore throat   HPI: Megan Pitts is a 89 y.o. female with medical history significant for HTN, PAF not anticoagulated, aortic stenosis s/p AVR, hypothyroidism, CKD 3A, chronic HFpEF, and memory loss who presents with SOB, productive cough, rhinorrhea, and sore throat.   She reports that these symptoms began ~3 days ago and have worsened. She denies chest pain or change in her chronic right leg swelling. She does not feel particularly SOB at rest but becomes severely dyspneic with activity and is complaining about severe rhinorrhea and mild sore throat.   She was seen in an outpatient clinic today where she was saturating 86% on room air. She was placed on 6 Lpm supplemental O2 and brought to the ED.   ED Course: Upon arrival to the ED, patient is found to be afebrile and saturating well on 4 Lpm supplemental O2. Labs are most notable for sodium 129, SCr 1.22, WBC 19,400, platelets 723k, BNP 562, and normal lactate. CXR demonstrates multifocal infiltrates bilaterally.   She was treated with Rocephin , azithromycin , and DuoNeb in the ED.   Review of Systems:  All other systems reviewed and apart from HPI, are negative.  Past Medical History:  Diagnosis Date   A-fib [facility])    Aortic stenosis    s/p AVR 07/2005(porcine)   Carotid bruit 09/2008   <50% B   GERD (gastroesophageal reflux disease)    Gilbert's syndrome    Hyperlipidemia    Hypertension    IFG (impaired fasting glucose)    OAB (overactive bladder)    Osteopenia    Phlebitis    UPPER EXTREMITY   PVC's (premature ventricular contractions)    Shingles    Varicose veins    s/p treatment    Vertigo     Past Surgical History:  Procedure Laterality Date   ABDOMINAL HYSTERECTOMY  1963   partial   AORTIC VALVE REPLACEMENT  08/01/2005    WITH A #23MM TORONTO STENTLESS PORCINE AORTIC VALVE   CARDIAC CATHETERIZATION  07/25/2005   EF 60% THE AORTIC VALVE IS HEAVILY CALCIFIED WITH REDUCED OPENING. NO MVP   FOOT SURGERY     HAND SURGERY     HEMORRHOID SURGERY  1980's   LUMBAR LAMINECTOMY/DECOMPRESSION MICRODISCECTOMY N/A 06/15/2023   Procedure: LUMBAR LAMINECTOMY MICRODISCECTOMY LUMBAR TWO-THREE;  Surgeon: Gillie Duncans, MD;  Location: [facility] OR;  Service: Neurosurgery;  Laterality: N/A;  L2-3 Lumbar Discectomy   ORIF ANKLE FRACTURE Right 09/11/2020   Procedure: OPEN REDUCTION INTERNAL FIXATION (ORIF) ANKLE FRACTURE;  Surgeon: Cristy Bonner DASEN, MD;  Location: [facility] OR;  Service: Orthopedics;  Laterality: Right;   US  ECHOCARDIOGRAPHY  04/14/2008   EF 55-60%. NORMAL   US  ECHOCARDIOGRAPHY  11/21/2005   EF 55-60%   US  ECHOCARDIOGRAPHY  07/20/2005   EF 55-60%   US  ECHOCARDIOGRAPHY  10/19/2004   EF 55-60%   VARICOSE VEIN SURGERY  2010   laser treatment    Social History:   reports that she has never smoked. She has never used smokeless tobacco. She reports that she does not drink alcohol  and does not use drugs.  Allergies  Allergen Reactions   Synthroid  [Levothyroxine  Sodium] Other (See Comments)    Unknown reaction  Pt currently taking levothyroxine  50mcg QD with no issues (as of 06/11/23)   Ultram [Tramadol Hcl] Other (See  Comments)    Unknown reaction    Family History  Problem Relation Age of Onset   Pneumonia Mother 76   Alzheimer's disease Father    Leukemia Brother    Alcohol  abuse Brother      Prior to Admission medications   Medication Sig Start Date End Date Taking? Authorizing Provider  amLODipine  (NORVASC ) 10 MG tablet Take 10 mg by mouth daily. 03/19/19   [provider]  donepezil  (ARICEPT ) 10 MG tablet Take 10 mg by mouth at bedtime.  01/11/15   [provider]  hydrochlorothiazide  (HYDRODIURIL ) 25 MG tablet Take 25 mg by mouth daily. 03/19/19   [provider]  levothyroxine  (SYNTHROID ) 50  MCG tablet Take 50 mcg by mouth daily. 09/18/22   [provider]  olmesartan (BENICAR) 40 MG tablet Take 40 mg by mouth daily.    [provider]  Vitamin D , Ergocalciferol , (DRISDOL ) 50000 units CAPS capsule Take 50,000 Units by mouth every Wednesday. On Sundays    [provider]    Physical Exam: Vitals:   09/03/23 1930 09/03/23 2000 09/03/23 2100 09/03/23 2122  BP: (!) 126/102 129/79 (!) 141/64   Pulse: 88 86 87   Resp: (!) 32 (!) 23 (!) 30   Temp:    98.7 F (37.1 C)  TempSrc:    Oral  SpO2: 94% 95% 93%     Constitutional: NAD, calm  Eyes: PERTLA, lids and conjunctivae normal ENMT: Mucous membranes are moist. Posterior pharynx clear of any exudate or lesions.   Neck: supple, no masses  Respiratory: Dyspnea with speech. Coarse rhonchi bilaterally. No wheezing.    Cardiovascular: S1 & S2 heard, regular rate and rhythm. Trace lower leg edema. No JVD.  Abdomen: No tenderness, soft. Bowel sounds active.  Musculoskeletal: no clubbing / cyanosis. No joint deformity upper and lower extremities.   Skin: no significant rashes, lesions, ulcers. Warm, dry, well-perfused. Neurologic: CN 2-12 grossly intact aside from gorss hearing deficit. Moving all extremities. Alert and oriented.  Psychiatric: Pleasant. Cooperative.    Labs and Imaging on Admission: I have personally reviewed following labs and imaging studies  CBC: Recent Labs  Lab 09/03/23 1651  WBC 19.4*  NEUTROABS 15.8*  HGB 12.8  HCT 37.9  MCV 82.8  PLT 723*   Basic Metabolic Panel: Recent Labs  Lab 09/03/23 1954  NA 129*  K 4.6  CL 97*  CO2 18*  GLUCOSE 136*  BUN 26*  CREATININE 1.22*  CALCIUM  8.6*   GFR: CrCl cannot be calculated (Unknown ideal weight.). Liver Function Tests: Recent Labs  Lab 09/03/23 1954  AST 25  ALT 14  ALKPHOS 88  BILITOT 2.0*  PROT 6.5  ALBUMIN 2.4*   No results for input(s): LIPASE, AMYLASE in the last 168 hours. No results for input(s):  AMMONIA in the last 168 hours. Coagulation Profile: No results for input(s): INR, PROTIME in the last 168 hours. Cardiac Enzymes: No results for input(s): CKTOTAL, CKMB, CKMBINDEX, TROPONINI in the last 168 hours. BNP (last 3 results) No results for input(s): PROBNP in the last 8760 hours. HbA1C: No results for input(s): HGBA1C in the last 72 hours. CBG: No results for input(s): GLUCAP in the last 168 hours. Lipid Profile: No results for input(s): CHOL, HDL, LDLCALC, TRIG, CHOLHDL, LDLDIRECT in the last 72 hours. Thyroid  Function Tests: No results for input(s): TSH, T4TOTAL, FREET4, T3FREE, THYROIDAB in the last 72 hours. Anemia Panel: No results for input(s): VITAMINB12, FOLATE, FERRITIN, TIBC, IRON, RETICCTPCT in the last 72  hours. Urine analysis:    Component Value Date/Time   COLORURINE YELLOW 06/11/2023 1352   APPEARANCEUR CLEAR 06/11/2023 1352   LABSPEC 1.019 06/11/2023 1352   PHURINE 5.0 06/11/2023 1352   GLUCOSEU NEGATIVE 06/11/2023 1352   HGBUR SMALL (A) 06/11/2023 1352   BILIRUBINUR NEGATIVE 06/11/2023 1352   KETONESUR NEGATIVE 06/11/2023 1352   PROTEINUR 100 (A) 06/11/2023 1352   NITRITE NEGATIVE 06/11/2023 1352   LEUKOCYTESUR NEGATIVE 06/11/2023 1352   Sepsis Labs: @LABRCNTIP (procalcitonin:4,lacticidven:4) ) Recent Results (from the past 240 hours)  Resp panel by RT-PCR (RSV, Flu A&B, Covid) Anterior Nasal Swab     Status: None   Collection Time: 09/03/23  4:51 PM   Specimen: Anterior Nasal Swab  Result Value Ref Range Status   SARS Coronavirus 2 by RT PCR NEGATIVE NEGATIVE Final   Influenza A by PCR NEGATIVE NEGATIVE Final   Influenza B by PCR NEGATIVE NEGATIVE Final    Comment: (NOTE) The Xpert Xpress SARS-CoV-2/FLU/RSV plus assay is intended as an aid in the diagnosis of influenza from Nasopharyngeal swab specimens and should not be used as a sole basis for treatment. Nasal washings and aspirates are  unacceptable for Xpert Xpress SARS-CoV-2/FLU/RSV testing.  Fact Sheet for Patients: BloggerCourse.com  Fact Sheet for Healthcare Providers: SeriousBroker.it  This test is not yet approved or cleared by the [place]  FDA and has been authorized for detection and/or diagnosis of SARS-CoV-2 by FDA under an Emergency Use Authorization (EUA). This EUA will remain in effect (meaning this test can be used) for the duration of the COVID-19 declaration under Section 564(b)(1) of the Act, 21 U.S.C. section 360bbb-3(b)(1), unless the authorization is terminated or revoked.     Resp Syncytial Virus by PCR NEGATIVE NEGATIVE Final    Comment: (NOTE) Fact Sheet for Patients: BloggerCourse.com  Fact Sheet for Healthcare Providers: SeriousBroker.it  This test is not yet approved or cleared by the [place]  FDA and has been authorized for detection and/or diagnosis of SARS-CoV-2 by FDA under an Emergency Use Authorization (EUA). This EUA will remain in effect (meaning this test can be used) for the duration of the COVID-19 declaration under Section 564(b)(1) of the Act, 21 U.S.C. section 360bbb-3(b)(1), unless the authorization is terminated or revoked.  Performed at [facility], [address], [place], [place] [address]      Radiological Exams on Admission: DG Chest Portable 1 View Result Date: 09/03/2023 CLINICAL DATA:  Shortness of breath. EXAM: PORTABLE CHEST 1 VIEW COMPARISON:  September 24, 2017 FINDINGS: Multiple sternal wires are present. The heart size and mediastinal contours are within normal limits. There is marked severity calcification of the thoracic aorta. Mild to moderate severity right upper lobe, right infrahilar, mid left lung and left basilar infiltrates are seen. No pleural effusion or pneumothorax is identified. The visualized skeletal structures are  unremarkable. IMPRESSION: Mild to moderate severity bilateral infiltrates, as described above. Electronically Signed   By: Suzen Dials M.D.   On: 09/03/2023 18:06    EKG: Independently reviewed. Sinus rhythm.   Assessment/Plan   1. Pneumonia; acute hypoxic respiratory failure  - Continue Rocephin  and azithromycin , continue supplemental O2 as-needed, check respiratory virus panel given prominent upper respiratory sxs, check/trend procalcitonin, follow clinical course    2. CKD 3A  - SCr is 1.22 on admission; baseline appears closer to 1  - Hold hydrochlorothiazide  and ARB for now, renally-dose medications, and repeat chem panel in am   3. Hyponatremia  - She appears euvolemic  - Hold  hydrochlorothiazide , check urine sodium and urine osm, and repeat chem panel in am -   4. Chronic HFpEF  - Elevated BNP noted but patient does not appear particularly hypervolemic  - Monitor weight and I/Os    5. PAF  - Not anticoagulated, currently in SR   6. Hypothyroidism  - Synthroid     7. Memory loss  - Delirium precautions    DVT prophylaxis: sq heparin   Code Status: DNR  Level of Care: Level of care: Progressive Family Communication: None present   Disposition Plan:  Patient is from: SNF  Anticipated d/c is to: TBD Anticipated d/c date is: 09/07/23  Patient currently: Pending improved respiratory status  Consults called: None  Admission status: Inpatient     Evalene GORMAN Sprinkles, MD [facility]  09/03/2023, 10:02 PM

## 2023-09-03 NOTE — ED Triage Notes (Signed)
 Pt bib GC ems from pcp office for potential CHF. Pt has been short of breath with exertion x3 days. Reported to be 81% on RA at pcp office. 96% on 6lpm.

## 2023-09-03 NOTE — ED Provider Notes (Signed)
 [place] EMERGENCY DEPARTMENT AT [facility] Provider Note   CSN: [id number] Arrival date & time: 09/03/23  1627     Patient presents with: Respiratory Distress   Megan Pitts is a 89 y.o. female.   HPI Patient resents with shortness of breath.  Comes from PCPs office..  Sats 81% on room air there.  Now on around 6 L of oxygen and up in the mid 90s.  Has been more short of breath patient cannot really provide much history.  Does have swelling her legs.  States swelling in the right leg is chronic after previous leg injury.  Reviewing records appears to have a history of aortic valve replacement.   Past Medical History:  Diagnosis Date   A-fib [facility])    Aortic stenosis    s/p AVR 07/2005(porcine)   Carotid bruit 09/2008   <50% B   GERD (gastroesophageal reflux disease)    Gilbert's syndrome    Hyperlipidemia    Hypertension    IFG (impaired fasting glucose)    OAB (overactive bladder)    Osteopenia    Phlebitis    UPPER EXTREMITY   PVC's (premature ventricular contractions)    Shingles    Varicose veins    s/p treatment    Vertigo     Prior to Admission medications   Medication Sig Start Date End Date Taking? Authorizing Provider  amLODipine  (NORVASC ) 10 MG tablet Take 10 mg by mouth daily. 03/19/19  Yes [provider]  aspirin  EC 81 MG tablet Take 81 mg by mouth daily. Swallow whole.   Yes [provider]  celecoxib (CELEBREX) 100 MG capsule Take 100 mg by mouth 2 (two) times daily. 08/23/23  Yes [provider]  donepezil  (ARICEPT ) 10 MG tablet Take 10 mg by mouth at bedtime.  01/11/15  Yes [provider]  hydrochlorothiazide  (HYDRODIURIL ) 25 MG tablet Take 25 mg by mouth daily. 03/19/19  Yes [provider]  levothyroxine  (SYNTHROID ) 50 MCG tablet Take 50 mcg by mouth daily. 09/18/22  Yes [provider]  olmesartan (BENICAR) 40 MG tablet Take 40 mg by mouth daily.   Yes [provider]  Vitamin D ,  Ergocalciferol , (DRISDOL ) 50000 units CAPS capsule Take 50,000 Units by mouth every Wednesday. On Wednesday   Yes [provider]    Allergies: Patient has no active allergies.    Review of Systems  Updated Vital Signs BP (!) 141/64   Pulse 87   Temp 98.8 F (37.1 C) (Oral)   Resp (!) 30   SpO2 93%   Physical Exam Vitals and nursing note reviewed.  Pulmonary:     Comments: Tachypnea with harsh breath sounds. Musculoskeletal:     Right lower leg: Edema present.     Left lower leg: Edema present.  Skin:    General: Skin is warm.  Neurological:     Mental Status: She is alert and oriented to person, place, and time.     (all labs ordered are listed, but only abnormal results are displayed) Labs Reviewed  BRAIN NATRIURETIC PEPTIDE - Abnormal; Notable for the following components:      Result Value   B Natriuretic Peptide 561.6 (*)    All other components within normal limits  CBC WITH DIFFERENTIAL/PLATELET - Abnormal; Notable for the following components:   WBC 19.4 (*)    Platelets 723 (*)    Neutro Abs 15.8 (*)    Monocytes Absolute 1.8 (*)    Abs Immature Granulocytes  0.12 (*)    All other components within normal limits  COMPREHENSIVE METABOLIC PANEL WITH GFR - Abnormal; Notable for the following components:   Sodium 129 (*)    Chloride 97 (*)    CO2 18 (*)    Glucose, Bld 136 (*)    BUN 26 (*)    Creatinine, Ser 1.22 (*)    Calcium  8.6 (*)    Albumin 2.4 (*)    Total Bilirubin 2.0 (*)    GFR, Estimated 41 (*)    All other components within normal limits  URINALYSIS, COMPLETE (UACMP) WITH MICROSCOPIC - Abnormal; Notable for the following components:   Protein, ur 100 (*)    Bacteria, UA RARE (*)    All other components within normal limits  RESP PANEL BY RT-PCR (RSV, FLU A&B, COVID)  RVPGX2  RESPIRATORY PANEL BY PCR  SODIUM, URINE, RANDOM  CREATININE, URINE, RANDOM  PROCALCITONIN  OSMOLALITY, URINE  UREA  NITROGEN, URINE  BASIC METABOLIC PANEL  WITH GFR  CBC  HEPATIC FUNCTION PANEL  I-STAT CG4 LACTIC ACID, ED    EKG: EKG Interpretation Date/Time:  Monday September 03 2023 16:57:41 EDT Ventricular Rate:  90 PR Interval:  184 QRS Duration:  90 QT Interval:  363 QTC Calculation: 445 R Axis:   16  Text Interpretation: Sinus rhythm Probable left atrial enlargement Anterior infarct, old Minimal ST depression, lateral leads Confirmed by Patsey Lot [phone]) on 09/03/2023 7:19:48 PM  Radiology: [facility] Chest Portable 1 View Result Date: 09/03/2023 CLINICAL DATA:  Shortness of breath. EXAM: PORTABLE CHEST 1 VIEW COMPARISON:  September 24, 2017 FINDINGS: Multiple sternal wires are present. The heart size and mediastinal contours are within normal limits. There is marked severity calcification of the thoracic aorta. Mild to moderate severity right upper lobe, right infrahilar, mid left lung and left basilar infiltrates are seen. No pleural effusion or pneumothorax is identified. The visualized skeletal structures are unremarkable. IMPRESSION: Mild to moderate severity bilateral infiltrates, as described above. Electronically Signed   By: Suzen Dials M.D.   On: 09/03/2023 18:06     Procedures   Medications Ordered in the ED  amLODipine  (NORVASC ) tablet 10 mg (has no administration in time range)  donepezil  (ARICEPT ) tablet 10 mg (10 mg Oral Given 09/03/23 2245)  levothyroxine  (SYNTHROID ) tablet 50 mcg (has no administration in time range)  heparin  injection 5,000 Units (5,000 Units Subcutaneous Given 09/03/23 2246)  sodium chloride  flush (NS) 0.9 % injection 3 mL (3 mLs Intravenous Given 09/03/23 2247)  acetaminophen  (TYLENOL ) tablet 650 mg (has no administration in time range)    Or  acetaminophen  (TYLENOL ) suppository 650 mg (has no administration in time range)  senna-docusate (Senokot-S) tablet 1 tablet (has no administration in time range)  ondansetron  (ZOFRAN ) tablet 4 mg (has no administration in time range)    Or   ondansetron  (ZOFRAN ) injection 4 mg (has no administration in time range)  cefTRIAXone  (ROCEPHIN ) 2 g in sodium chloride  0.9 % 100 mL IVPB (has no administration in time range)  azithromycin  (ZITHROMAX ) tablet 500 mg (has no administration in time range)  guaiFENesin  (ROBITUSSIN) 100 MG/5ML liquid 5 mL (5 mLs Oral Given 09/03/23 2245)  ipratropium-albuterol  (DUONEB) 0.5-2.5 (3) MG/3ML nebulizer solution 3 mL (3 mLs Nebulization Given 09/03/23 2302)  cefTRIAXone  (ROCEPHIN ) 2 g in sodium chloride  0.9 % 100 mL IVPB (0 g Intravenous Stopped 09/03/23 1913)  azithromycin  (ZITHROMAX ) 500 mg in sodium chloride  0.9 % 250 mL IVPB (0 mg Intravenous Stopped 09/03/23 2016)  ipratropium-albuterol  (DUONEB) 0.5-2.5 (  3) MG/3ML nebulizer solution 3 mL (3 mLs Nebulization Given 09/03/23 1836)                                    Medical Decision Making Amount and/or Complexity of Data Reviewed Labs: ordered. Radiology: ordered.  Risk Prescription drug management. Decision regarding hospitalization.   Patient with dyspnea and shortness of breath.  Swelling on legs.  Differential diagnoses longer does include causes such as CHF, COPD.  Will get x-ray.  Will get blood work.  Reviewed as much note as I could from PCP today.  Was a delay in getting CMP.  BNP is elevated.  X-ray does show pneumonia.  I think pneumonia likely the cause of her hypoxia but BNP is elevated.  Discussed with hospitalist will admit.        Final diagnoses:  Community acquired pneumonia, unspecified laterality  Hypoxia    ED Discharge Orders     None          Patsey Lot, MD 09/03/23 [phone]

## 2023-09-04 ENCOUNTER — Inpatient Hospital Stay (HOSPITAL_COMMUNITY)

## 2023-09-04 DIAGNOSIS — I5032 Chronic diastolic (congestive) heart failure: Secondary | ICD-10-CM | POA: Diagnosis not present

## 2023-09-04 DIAGNOSIS — I10 Essential (primary) hypertension: Secondary | ICD-10-CM

## 2023-09-04 DIAGNOSIS — I480 Paroxysmal atrial fibrillation: Secondary | ICD-10-CM | POA: Diagnosis not present

## 2023-09-04 DIAGNOSIS — J189 Pneumonia, unspecified organism: Secondary | ICD-10-CM | POA: Diagnosis not present

## 2023-09-04 DIAGNOSIS — R0609 Other forms of dyspnea: Secondary | ICD-10-CM

## 2023-09-04 LAB — ECHOCARDIOGRAM COMPLETE
AR max vel: 2.07 cm2
AV Area VTI: 2.59 cm2
AV Area mean vel: 2.46 cm2
AV Mean grad: 6 mmHg
AV Peak grad: 13.8 mmHg
Ao pk vel: 1.86 m/s
Area-P 1/2: 2.99 cm2
MV VTI: 1.92 cm2
S' Lateral: 2.7 cm

## 2023-09-04 LAB — CBC
HCT: 32.3 % — ABNORMAL LOW (ref 36.0–46.0)
Hemoglobin: 10.8 g/dL — ABNORMAL LOW (ref 12.0–15.0)
MCH: 27.8 pg (ref 26.0–34.0)
MCHC: 33.4 g/dL (ref 30.0–36.0)
MCV: 83.2 fL (ref 80.0–100.0)
Platelets: 593 K/uL — ABNORMAL HIGH (ref 150–400)
RBC: 3.88 MIL/uL (ref 3.87–5.11)
RDW: 14.3 % (ref 11.5–15.5)
WBC: 17.4 K/uL — ABNORMAL HIGH (ref 4.0–10.5)
nRBC: 0 % (ref 0.0–0.2)

## 2023-09-04 LAB — RESPIRATORY PANEL BY PCR

## 2023-09-04 LAB — HEPATIC FUNCTION PANEL
ALT: 15 U/L (ref 0–44)
AST: 16 U/L (ref 15–41)
Albumin: 2.4 g/dL — ABNORMAL LOW (ref 3.5–5.0)
Alkaline Phosphatase: 87 U/L (ref 38–126)
Bilirubin, Direct: 0.2 mg/dL (ref 0.0–0.2)
Indirect Bilirubin: 0.9 mg/dL (ref 0.3–0.9)
Total Bilirubin: 1.1 mg/dL (ref 0.0–1.2)
Total Protein: 6.4 g/dL — ABNORMAL LOW (ref 6.5–8.1)

## 2023-09-04 LAB — OSMOLALITY, URINE: Osmolality, Ur: 540 mosm/kg (ref 300–900)

## 2023-09-04 LAB — BASIC METABOLIC PANEL WITH GFR
Anion gap: 12 (ref 5–15)
BUN: 25 mg/dL — ABNORMAL HIGH (ref 8–23)
CO2: 19 mmol/L — ABNORMAL LOW (ref 22–32)
Calcium: 8.8 mg/dL — ABNORMAL LOW (ref 8.9–10.3)
Chloride: 101 mmol/L (ref 98–111)
Creatinine, Ser: 1.19 mg/dL — ABNORMAL HIGH (ref 0.44–1.00)
GFR, Estimated: 43 mL/min — ABNORMAL LOW (ref >60)
Glucose, Bld: 115 mg/dL — ABNORMAL HIGH (ref 70–99)
Potassium: 4.3 mmol/L (ref 3.5–5.1)
Sodium: 132 mmol/L — ABNORMAL LOW (ref 135–145)

## 2023-09-04 MED ORDER — METHYLPREDNISOLONE SODIUM SUCC 40 MG IJ SOLR
40.0000 mg | INTRAMUSCULAR | Status: DC
  Administered 2023-09-04: 40 mg via INTRAVENOUS
  Filled 2023-09-04 (×2): qty 1

## 2023-09-04 MED ORDER — AZITHROMYCIN 250 MG PO TABS
500.0000 mg | ORAL_TABLET | Freq: Every day | ORAL | Status: DC
  Administered 2023-09-04 – 2023-09-06 (×3): 500 mg via ORAL
  Filled 2023-09-04 (×3): qty 2

## 2023-09-04 MED ORDER — ALBUTEROL SULFATE (2.5 MG/3ML) 0.083% IN NEBU
2.5000 mg | INHALATION_SOLUTION | RESPIRATORY_TRACT | Status: DC | PRN
  Administered 2023-09-05: 2.5 mg via RESPIRATORY_TRACT
  Filled 2023-09-04: qty 3

## 2023-09-04 MED ORDER — SODIUM CHLORIDE 0.9 % IV SOLN
2.0000 g | INTRAVENOUS | Status: DC
  Administered 2023-09-04 – 2023-09-05 (×2): 2 g via INTRAVENOUS
  Filled 2023-09-04 (×2): qty 20

## 2023-09-04 MED ORDER — GUAIFENESIN-DM 100-10 MG/5ML PO SYRP
5.0000 mL | ORAL_SOLUTION | ORAL | Status: DC | PRN
  Administered 2023-09-04 – 2023-09-05 (×2): 5 mL via ORAL
  Filled 2023-09-04 (×2): qty 5

## 2023-09-04 MED ORDER — ACETAMINOPHEN 325 MG PO TABS
650.0000 mg | ORAL_TABLET | Freq: Once | ORAL | Status: AC
  Administered 2023-09-04: 650 mg via ORAL
  Filled 2023-09-04: qty 2

## 2023-09-04 MED ORDER — IPRATROPIUM-ALBUTEROL 0.5-2.5 (3) MG/3ML IN SOLN
3.0000 mL | Freq: Four times a day (QID) | RESPIRATORY_TRACT | Status: DC
  Administered 2023-09-04 – 2023-09-05 (×3): 3 mL via RESPIRATORY_TRACT
  Filled 2023-09-04 (×4): qty 3

## 2023-09-04 NOTE — Assessment & Plan Note (Signed)
 Continue blood pressure control with amlodipine  Continue blood pressure monitoring

## 2023-09-04 NOTE — Progress Notes (Addendum)
 Progress Note   Patient: Megan Pitts FMW:[id number] DOB: [birth year] DOA: 09/03/2023     1 DOS: the patient was seen and examined on 09/04/2023   Brief hospital course: Megan Pitts was admitted to the hospital with the working diagnosis of community acquired pneumonia.   89 yo female with the past medical history of hypertension, paroxysmal atrial fibrillation, aortic stenosis sp AVR, CKD, heart failure, and memory loss who presented with dyspnea.  Reported 3 days of cough, rhinorrhea, and sore throat, along with dyspnea on exertion.  She was evaluated in the outpatient clinic on the day of admission, and she was found hypoxemic with 02 saturation 81% on room air. EMS was called she was placed on  6 L/min per [place] and was transported to the ED.  On her initial physical examination her blood pressure was 126/102. 129/79, HR 87, RR 32 and 02 saturation 93% on supplemental 02 per [place] Lungs with bilateral rhonchi, rales and wheezing, heart with S1 and S2 present and regular, abdomen with no distention, no lower extremity edema.   Na 129, K 4.6 Cl 97 bicarbonate 18 glucose 136 bun 26 cr 1,22 AST 25 ALT 14  BNP 561  Lactic acid 1,5  Procalcitonin 0,25 Wbc 19,4 hgb 12.8 plt 723   Influenza negative RSV negative SARS covid 19 negative   Urine analysis SG 1,018, protein 100, negative leukocytes and negative hgb   Chest radiograph with hypoinflation, mild cardiomegaly, bilateral interstitial infiltrates, upper and lower lobes, more dense at the lower lobes. Sternotomy wires in place.   EKG 90 bpm, normal axis, normal intervals, qtc 445, sinus rhythm with no significant ST segment or T wave changes. Poor RR wave progression.,   Assessment and Plan: * Multifocal pneumonia Multifocal pneumonia, community acquired, complicated with severe sepsis present on admission.  Acute hypoxemic respirator failure  Wbc 17.4   Plan to continue broad spectrum antibiotic therapy with IV ceftriaxone  and po  azithromycin  Place on Bipa due to increased work of breathing  Bronchodilator therapy and airway clearing techniques.  Add systemic steroids for severe pneumonia.  Keep -02 saturation 92% or greater.  No IV fluids due to risk of pulmonary edema.  Check urinary antigen legionella and streptococcal pneumonia   Chronic diastolic CHF (congestive heart failure) (HCC) 2020 echocardiogram with preserved LV systolic function with EF > 65%, RV systolic function preserved, LA normal size, aortic valve functioning well.   No clinica signs of exacerbation Will hold on loop diuretic for now.  Check new echocardiogram.  Continue blood pressure monitoring   Paroxysmal atrial fibrillation [facility]) Patient currently on sinus rhythm Continue telemetry monitoring  Not on anticoagulation at home.   Essential hypertension Continue blood pressure control with amlodipine   Continue blood pressure monitoring   CKD (chronic kidney disease), stage III (HCC) Hyponatremia.   Renal function today with serum cr at 1.19 with K at 4.3 and serum bicarbonate at 19  Na 132   Plan to continue to hold on diuretic therapy Follow up renal function and electrolytes.  Avoid hypotension and nephrotoxic medications   Hypothyroidism Continue with levothyroxine    Thrombocytosis Follow cell count.   Memory impairment No confusion or agitation Continue donepezil .  Positive decreased hearing, her daughter is at the bedside.      Subjective: Patient with increased work of breathing, fatigued and having tremors, positive cough and dyspnea at rest.   Physical Exam: Vitals:   09/04/23 0900 09/04/23 0924 09/04/23 1037 09/04/23 1047  BP: (!) 181/45 ROLLEN)  181/45  (!) 180/50  Pulse: 70   85  Resp: (!) 26   (!) 30  Temp:   99.6 F (37.6 C)   TempSrc:   Oral   SpO2: 95%   98%   Neurology awake and alert, deconditioned ENT with mild pallor with no icterus Cardiovascular with S1 and S2 present and regular, tachycardic  with no gallops or rubs No JVD Respiratory with bilateral diffuse rales, rhonchi and expiratory wheezing, increased work of breathing with accessory muscle use.  Abdomen with no distention  No lower extremity edema   Data Reviewed:    Family Communication: I spoke with patient's daughter at the bedside, we talked in detail about patient's condition, plan of care and prognosis and all questions were addressed.   Disposition: Status is: Inpatient Remains inpatient appropriate because: respiratory failure   Planned Discharge Destination: Home     Author: Elidia Toribio Furnace, MD 09/04/2023 11:05 AM  For on call review [email or web address].

## 2023-09-04 NOTE — Assessment & Plan Note (Signed)
 Follow cell count.

## 2023-09-04 NOTE — Assessment & Plan Note (Signed)
 Hyponatremia.   Renal function today with serum cr at 1.19 with K at 4.3 and serum bicarbonate at 19  Na 132   Plan to continue to hold on diuretic therapy Follow up renal function and electrolytes.  Avoid hypotension and nephrotoxic medications

## 2023-09-04 NOTE — TOC CM/SW Note (Signed)
 Transition of Care [facility]) - Inpatient Brief Assessment   Patient Details  Name: Megan Pitts MRN: [id number] Date of Birth: 09-28-1927  Transition of Care [facility]) CM/SW Contact:    Waddell Barnie Rama, RN Phone Number: 09/04/2023, 3:06 PM   Clinical Narrative: From [facility],  has PCP and insurance on file, states has no HH services in place at this time or DME at home.  States family member  (SIL) will transport them home at [employer] and family is support system, states gets medications from [facility].  Pta self ambulatory.   Patient gives this NCM permission to speak with her Daughter, Odetta.    Transition of Care Asessment: Insurance and Status: Insurance coverage has been reviewed Patient has primary care physician: Yes Home environment has been reviewed: [facility] Prior level of function:: indep Prior/Current Home Services: Current home services (walker) Social Drivers of Health Review: SDOH reviewed no interventions necessary Readmission risk has been reviewed: Yes Transition of care needs: transition of care needs identified, TOC will continue to follow

## 2023-09-04 NOTE — Assessment & Plan Note (Signed)
 Patient currently on sinus rhythm Continue telemetry monitoring  Not on anticoagulation at home.

## 2023-09-04 NOTE — Progress Notes (Incomplete)
 PROGRESS NOTE    Megan Pitts  FMW:[id number] DOB: Jun 25, 1927 DOA: 09/03/2023 PCP: Megan Elsie [name], MD  89/F w hypertension, paroxysmal atrial fibrillation, aortic stenosis sp AVR, CKD, heart failure, and memory loss who presented with cough, rhinorrhea, and sore throat, along with dyspnea on exertion.  In ED, Hypoxic, cr 1,22, BNP 561  Procalcitonin 0,25 Influenza negative RSV negative SARS covid 19 negative  CXR w hypoinflation, mild cardiomegaly, bilateral interstitial infiltrates, upper and lower lobes, more dense at the lower lobes.   Subjective:   Assessment and Plan:  Multifocal pneumonia Severe Sepsis, poa Acute hypoxemic respirator failure  -continue broad spectrum antibiotic therapy with IV ceftriaxone  and po azithromycin  Place on Bipap due to increased work of breathing  Bronchodilator therapy and airway clearing techniques.  Add systemic steroids for severe pneumonia.  Keep -02 saturation 92% or greater.  No IV fluids due to risk of pulmonary edema.  Check urinary antigen legionella and streptococcal pneumonia   Chronic diastolic CHF (congestive heart failure) (HCC) 2020 echocardiogram with preserved LV systolic function with EF > 65%, RV systolic function preserved, LA normal size, aortic valve functioning well.  Will hold on loop diuretic for now.  Check new echocardiogram.   Paroxysmal atrial fibrillation [facility]) Patient currently on sinus rhythm Not on anticoagulation at home.   Essential hypertension Continue amlodipine    CKD (chronic kidney disease), stage III (HCC) Hyponatremia.  cr at 1.19 with K at 4.3  -continue to hold on diuretic therapy  Hypothyroidism Continue with levothyroxine    Thrombocytosis Follow cell count.   Memory impairment No confusion or agitation Continue donepezil .  Positive decreased hearing, her daughter is at the bedside.    DVT prophylaxis: Hep SQ Code Status: DNR Family Communication: Disposition Plan:    Consultants:    Procedures:   Antimicrobials:    Objective: Vitals:   09/04/23 0924 09/04/23 1037 09/04/23 1047 09/04/23 1228  BP: (!) 181/45  (!) 180/50 (!) 179/48  Pulse:   85 84  Resp:   (!) 30 (!) 34  Temp:  99.6 F (37.6 C)    TempSrc:  Oral    SpO2:   98% 97%    Intake/Output Summary (Last 24 hours) at 09/04/2023 1402 Last data filed at 09/03/2023 2016 Gross per 24 hour  Intake 362.99 ml  Output --  Net 362.99 ml   There were no vitals filed for this visit.  Examination:      Data Reviewed:   CBC: Recent Labs  Lab 09/03/23 1651 09/04/23 0640  WBC 19.4* 17.4*  NEUTROABS 15.8*  --   HGB 12.8 10.8*  HCT 37.9 32.3*  MCV 82.8 83.2  PLT 723* 593*   Basic Metabolic Panel: Recent Labs  Lab 09/03/23 1954 09/04/23 0640  NA 129* 132*  K 4.6 4.3  CL 97* 101  CO2 18* 19*  GLUCOSE 136* 115*  BUN 26* 25*  CREATININE 1.22* 1.19*  CALCIUM  8.6* 8.8*   GFR: CrCl cannot be calculated (Unknown ideal weight.). Liver Function Tests: Recent Labs  Lab 09/03/23 1954 09/04/23 0640  AST 25 16  ALT 14 15  ALKPHOS 88 87  BILITOT 2.0* 1.1  PROT 6.5 6.4*  ALBUMIN 2.4* 2.4*   No results for input(s): LIPASE, AMYLASE in the last 168 hours. No results for input(s): AMMONIA in the last 168 hours. Coagulation Profile: No results for input(s): INR, PROTIME in the last 168 hours. Cardiac Enzymes: No results for input(s): CKTOTAL, CKMB, CKMBINDEX, TROPONINI in the last 168 hours.  BNP (last 3 results) No results for input(s): PROBNP in the last 8760 hours. HbA1C: No results for input(s): HGBA1C in the last 72 hours. CBG: No results for input(s): GLUCAP in the last 168 hours. Lipid Profile: No results for input(s): CHOL, HDL, LDLCALC, TRIG, CHOLHDL, LDLDIRECT in the last 72 hours. Thyroid  Function Tests: No results for input(s): TSH, T4TOTAL, FREET4, T3FREE, THYROIDAB in the last 72 hours. Anemia Panel: No  results for input(s): VITAMINB12, FOLATE, FERRITIN, TIBC, IRON, RETICCTPCT in the last 72 hours. Urine analysis:    Component Value Date/Time   COLORURINE YELLOW 09/03/2023 2252   APPEARANCEUR CLEAR 09/03/2023 2252   LABSPEC 1.018 09/03/2023 2252   PHURINE 6.0 09/03/2023 2252   GLUCOSEU NEGATIVE 09/03/2023 2252   HGBUR NEGATIVE 09/03/2023 2252   BILIRUBINUR NEGATIVE 09/03/2023 2252   KETONESUR NEGATIVE 09/03/2023 2252   PROTEINUR 100 (A) 09/03/2023 2252   NITRITE NEGATIVE 09/03/2023 2252   LEUKOCYTESUR NEGATIVE 09/03/2023 2252   Sepsis Labs: @LABRCNTIP (procalcitonin:4,lacticidven:4)  ) Recent Results (from the past 240 hours)  Resp panel by RT-PCR (RSV, Flu A&B, Covid) Anterior Nasal Swab     Status: None   Collection Time: 09/03/23  4:51 PM   Specimen: Anterior Nasal Swab  Result Value Ref Range Status   SARS Coronavirus 2 by RT PCR NEGATIVE NEGATIVE Final   Influenza A by PCR NEGATIVE NEGATIVE Final   Influenza B by PCR NEGATIVE NEGATIVE Final    Comment: (NOTE) The Xpert Xpress SARS-CoV-2/FLU/RSV plus assay is intended as an aid in the diagnosis of influenza from Nasopharyngeal swab specimens and should not be used as a sole basis for treatment. Nasal washings and aspirates are unacceptable for Xpert Xpress SARS-CoV-2/FLU/RSV testing.  Fact Sheet for Patients: BloggerCourse.com  Fact Sheet for Healthcare Providers: SeriousBroker.it  This test is not yet approved or cleared by the [place]  FDA and has been authorized for detection and/or diagnosis of SARS-CoV-2 by FDA under an Emergency Use Authorization (EUA). This EUA will remain in effect (meaning this test can be used) for the duration of the COVID-19 declaration under Section 564(b)(1) of the Act, 21 U.S.C. section 360bbb-3(b)(1), unless the authorization is terminated or revoked.     Resp Syncytial Virus by PCR NEGATIVE NEGATIVE Final     Comment: (NOTE) Fact Sheet for Patients: BloggerCourse.com  Fact Sheet for Healthcare Providers: SeriousBroker.it  This test is not yet approved or cleared by the [place]  FDA and has been authorized for detection and/or diagnosis of SARS-CoV-2 by FDA under an Emergency Use Authorization (EUA). This EUA will remain in effect (meaning this test can be used) for the duration of the COVID-19 declaration under Section 564(b)(1) of the Act, 21 U.S.C. section 360bbb-3(b)(1), unless the authorization is terminated or revoked.  Performed at [facility], [address], [place], [place] [address]      Radiology Studies: DG Chest Portable 1 View Result Date: 09/03/2023 CLINICAL DATA:  Shortness of breath. EXAM: PORTABLE CHEST 1 VIEW COMPARISON:  September 24, 2017 FINDINGS: Multiple sternal wires are present. The heart size and mediastinal contours are within normal limits. There is marked severity calcification of the thoracic aorta. Mild to moderate severity right upper lobe, right infrahilar, mid left lung and left basilar infiltrates are seen. No pleural effusion or pneumothorax is identified. The visualized skeletal structures are unremarkable. IMPRESSION: Mild to moderate severity bilateral infiltrates, as described above. Electronically Signed   By: Suzen Dials M.D.   On: 09/03/2023 18:06     Scheduled  Meds:  amLODipine   10 mg Oral Daily   azithromycin   500 mg Oral Daily   donepezil   10 mg Oral QHS   heparin   5,000 Units Subcutaneous Q8H   ipratropium-albuterol   3 mL Nebulization Q6H   levothyroxine   50 mcg Oral Daily   methylPREDNISolone  (SOLU-MEDROL ) injection  40 mg Intravenous Q24H   sodium chloride  flush  3 mL Intravenous Q12H   Continuous Infusions:  cefTRIAXone  (ROCEPHIN )  IV       LOS: 1 day    Time spent:    Sigurd Pac, MD [facility]   09/04/2023, 2:02 PM

## 2023-09-04 NOTE — Assessment & Plan Note (Signed)
 2020 echocardiogram with preserved LV systolic function with EF > 65%, RV systolic function preserved, LA normal size, aortic valve functioning well.   No clinica signs of exacerbation Will hold on loop diuretic for now.  Check new echocardiogram.  Continue blood pressure monitoring

## 2023-09-04 NOTE — Assessment & Plan Note (Addendum)
 Multifocal pneumonia, community acquired, complicated with severe sepsis present on admission.  Acute hypoxemic respirator failure  Wbc 17.4   Plan to continue broad spectrum antibiotic therapy with IV ceftriaxone  and po azithromycin  Place on Bipa due to increased work of breathing  Bronchodilator therapy and airway clearing techniques.  Add systemic steroids for severe pneumonia.  Keep -02 saturation 92% or greater.  No IV fluids due to risk of pulmonary edema.  Check urinary antigen legionella and streptococcal pneumonia

## 2023-09-04 NOTE — Assessment & Plan Note (Addendum)
 No confusion or agitation Continue donepezil .  Positive decreased hearing, her daughter is at the bedside.

## 2023-09-04 NOTE — Hospital Course (Addendum)
 Megan Pitts was admitted to the hospital with the working diagnosis of community acquired pneumonia.   89 yo female with the past medical history of hypertension, paroxysmal atrial fibrillation, aortic stenosis sp AVR, CKD, heart failure, and memory loss who presented with dyspnea.  Reported 3 days of cough, rhinorrhea, and sore throat, along with dyspnea on exertion.  She was evaluated in the outpatient clinic on the day of admission, and she was found hypoxemic with 02 saturation 81% on room air. EMS was called she was placed on  6 L/min per [place] and was transported to the ED.  On her initial physical examination her blood pressure was 126/102. 129/79, HR 87, RR 32 and 02 saturation 93% on supplemental 02 per [place] Lungs with bilateral rhonchi, rales and wheezing, heart with S1 and S2 present and regular, abdomen with no distention, no lower extremity edema.   Na 129, K 4.6 Cl 97 bicarbonate 18 glucose 136 bun 26 cr 1,22 AST 25 ALT 14  BNP 561  Lactic acid 1,5  Procalcitonin 0,25 Wbc 19,4 hgb 12.8 plt 723   Influenza negative RSV negative SARS covid 19 negative   Urine analysis SG 1,018, protein 100, negative leukocytes and negative hgb   Chest radiograph with hypoinflation, mild cardiomegaly, bilateral interstitial infiltrates, upper and lower lobes, more dense at the lower lobes. Sternotomy wires in place.   EKG 90 bpm, normal axis, normal intervals, qtc 445, sinus rhythm with no significant ST segment or T wave changes. Poor RR wave progression.,

## 2023-09-04 NOTE — Assessment & Plan Note (Deleted)
 Continue statin therapy

## 2023-09-04 NOTE — Assessment & Plan Note (Signed)
 Continue with levothyroxine

## 2023-09-04 NOTE — ED Notes (Signed)
 Pt  did not eat her breakfast or lunch. Patient asked for strawberry icecream and applesauce. This RN provided.

## 2023-09-05 ENCOUNTER — Inpatient Hospital Stay (HOSPITAL_COMMUNITY)

## 2023-09-05 DIAGNOSIS — I10 Essential (primary) hypertension: Secondary | ICD-10-CM | POA: Diagnosis not present

## 2023-09-05 DIAGNOSIS — I5032 Chronic diastolic (congestive) heart failure: Secondary | ICD-10-CM | POA: Diagnosis not present

## 2023-09-05 DIAGNOSIS — I480 Paroxysmal atrial fibrillation: Secondary | ICD-10-CM | POA: Diagnosis not present

## 2023-09-05 DIAGNOSIS — J189 Pneumonia, unspecified organism: Secondary | ICD-10-CM | POA: Diagnosis not present

## 2023-09-05 LAB — BASIC METABOLIC PANEL WITH GFR
Anion gap: 10 (ref 5–15)
BUN: 28 mg/dL — ABNORMAL HIGH (ref 8–23)
CO2: 20 mmol/L — ABNORMAL LOW (ref 22–32)
Calcium: 8.9 mg/dL (ref 8.9–10.3)
Chloride: 100 mmol/L (ref 98–111)
Creatinine, Ser: 1.07 mg/dL — ABNORMAL HIGH (ref 0.44–1.00)
GFR, Estimated: 48 mL/min — ABNORMAL LOW (ref >60)
Glucose, Bld: 150 mg/dL — ABNORMAL HIGH (ref 70–99)
Potassium: 4.4 mmol/L (ref 3.5–5.1)
Sodium: 130 mmol/L — ABNORMAL LOW (ref 135–145)

## 2023-09-05 LAB — CBC
HCT: 31.2 % — ABNORMAL LOW (ref 36.0–46.0)
Hemoglobin: 10.5 g/dL — ABNORMAL LOW (ref 12.0–15.0)
MCH: 27.8 pg (ref 26.0–34.0)
MCHC: 33.7 g/dL (ref 30.0–36.0)
MCV: 82.5 fL (ref 80.0–100.0)
Platelets: 546 K/uL — ABNORMAL HIGH (ref 150–400)
RBC: 3.78 MIL/uL — ABNORMAL LOW (ref 3.87–5.11)
RDW: 13.9 % (ref 11.5–15.5)
WBC: 16.7 K/uL — ABNORMAL HIGH (ref 4.0–10.5)
nRBC: 0 % (ref 0.0–0.2)

## 2023-09-05 LAB — UREA NITROGEN, URINE: Urea Nitrogen, Ur: 837 mg/dL

## 2023-09-05 LAB — BLOOD GAS, VENOUS
Acid-base deficit: 3.6 mmol/L — ABNORMAL HIGH (ref 0.0–2.0)
Bicarbonate: 20.6 mmol/L (ref 20.0–28.0)
Drawn by: 70495
O2 Saturation: 71.7 %
Patient temperature: 36.5
pCO2, Ven: 33 mmHg — ABNORMAL LOW (ref 44–60)
pH, Ven: 7.4 (ref 7.25–7.43)
pO2, Ven: 40 mmHg (ref 32–45)

## 2023-09-05 MED ORDER — IPRATROPIUM BROMIDE 0.02 % IN SOLN
0.5000 mg | Freq: Four times a day (QID) | RESPIRATORY_TRACT | Status: DC
  Administered 2023-09-05 – 2023-09-06 (×5): 0.5 mg via RESPIRATORY_TRACT
  Filled 2023-09-05 (×5): qty 2.5

## 2023-09-05 MED ORDER — METHYLPREDNISOLONE SODIUM SUCC 40 MG IJ SOLR
40.0000 mg | Freq: Two times a day (BID) | INTRAMUSCULAR | Status: DC
  Administered 2023-09-05 – 2023-09-06 (×3): 40 mg via INTRAVENOUS
  Filled 2023-09-05 (×2): qty 1

## 2023-09-05 MED ORDER — ACETYLCYSTEINE 20 % IN SOLN
3.0000 mL | Freq: Three times a day (TID) | RESPIRATORY_TRACT | Status: DC
  Administered 2023-09-05 – 2023-09-06 (×2): 3 mL via RESPIRATORY_TRACT
  Filled 2023-09-05 (×6): qty 4

## 2023-09-05 MED ORDER — FUROSEMIDE 10 MG/ML IJ SOLN
20.0000 mg | Freq: Once | INTRAMUSCULAR | Status: AC
  Administered 2023-09-05: 20 mg via INTRAVENOUS
  Filled 2023-09-05: qty 2

## 2023-09-05 MED ORDER — BUDESONIDE 0.25 MG/2ML IN SUSP
0.2500 mg | Freq: Two times a day (BID) | RESPIRATORY_TRACT | Status: DC
  Administered 2023-09-05 – 2023-09-06 (×3): 0.25 mg via RESPIRATORY_TRACT
  Filled 2023-09-05 (×3): qty 2

## 2023-09-05 MED ORDER — LEVALBUTEROL HCL 0.63 MG/3ML IN NEBU
0.6300 mg | INHALATION_SOLUTION | Freq: Four times a day (QID) | RESPIRATORY_TRACT | Status: DC
  Administered 2023-09-05 – 2023-09-06 (×5): 0.63 mg via RESPIRATORY_TRACT
  Filled 2023-09-05 (×5): qty 3

## 2023-09-05 NOTE — Progress Notes (Signed)
 [facility]  PROGRESS NOTE  Megan Pitts FMW:[id number] DOB: [birth year] DOA: 09/03/2023 PCP: Megan Pitts., MD   Brief HPI:   89 yo female with the past medical history of hypertension, paroxysmal atrial fibrillation, aortic stenosis sp AVR, CKD, heart failure, and memory loss who presented with dyspnea.  Reported 3 days of cough, rhinorrhea, and sore throat, along with dyspnea on exertion.  She was evaluated in the outpatient clinic on the day of admission, and she was found hypoxemic with 02 saturation 81% on room air. EMS was called she was placed on  6 L/min per Megan Pitts and was transported to the ED.  On her initial physical examination her blood pressure was 126/102. 129/79, HR 87, RR 32 and 02 saturation 93% on supplemental 02 per Megan Pitts Lungs with bilateral rhonchi, rales and wheezing, heart with S1 and S2 present and regular, abdomen with no distention, no lower extremity edema.    Na 129, K 4.6 Cl 97 bicarbonate 18 glucose 136 bun 26 cr 1,22 AST 25 ALT 14  BNP 561  Lactic acid 1,5  Procalcitonin 0,25 Wbc 19,4 hgb 12.8 plt 723    Influenza negative RSV negative SARS covid 19 negative     Assessment/Plan:   Multifocal pneumonia Multifocal pneumonia, community acquired, complicated with severe sepsis present on admission.  Acute hypoxemic respirator failure  Wbc 17.4  Started on broad-spectrum antibiotic with Rocephin  and Zithromax  -Complains of anxiety with albuterol , will change Xopenex  every 6 hours scheduled -Continue Atrovent  every 6 hours, add Pulmicort  nebulizer twice daily -Increase Solu-Medrol  to 40 mg IV twice daily -BNP elevated to 569, will give 1 dose of Lasix  20 mg IV, echocardiogram shows grade 2 diastolic dysfunction -Wean off oxygen as tolerated -Flutter valve, will add Mucomyst  nebulizer twice daily Check urinary antigen legionella and streptococcal pneumonia    Chronic diastolic CHF (congestive heart failure) (HCC) 2020 echocardiogram with preserved  LV systolic function with EF > 65%, RV systolic function preserved, LA normal size, aortic valve functioning well.  Started on 1 dose of Lasix  20 mg IV as above   Paroxysmal atrial fibrillation Megan Pitts) Patient currently on sinus rhythm Continue telemetry monitoring  Not on anticoagulation at home.    Essential hypertension Continue blood pressure control with amlodipine   Continue blood pressure monitoring    CKD (chronic kidney disease), stage III (HCC) Hyponatremia.    Renal function today with serum cr at 1.19 with K at 4.3 and serum bicarbonate at 19  Na 130    Hypothyroidism Continue with levothyroxine     Thrombocytosis Follow cell count.    Memory impairment No confusion or agitation Continue donepezil .  Positive decreased hearing, her daughter is at the bedside.         Medications     amLODipine   10 mg Oral Daily   azithromycin   500 mg Oral Daily   donepezil   10 mg Oral QHS   heparin   5,000 Units Subcutaneous Q8H   ipratropium-albuterol   3 mL Nebulization Q6H   levothyroxine   50 mcg Oral Daily   methylPREDNISolone  (SOLU-MEDROL ) injection  40 mg Intravenous Q24H   sodium chloride  flush  3 mL Intravenous Q12H     Data Reviewed:   CBG:  No results for input(s): GLUCAP in the last 168 hours.  SpO2: 92 % O2 Flow Rate (L/min): 10 L/min FiO2 (%): 40 %    Vitals:   09/05/23 0015 09/05/23 0700 09/05/23 0716 09/05/23 0748  BP: (!) 147/119 (!) 155/64    Pulse:  81 73    Resp: 16 (!) 30  (!) 24  Temp: 97.7 F (36.5 C) 97.6 F (36.4 C)    TempSrc: Oral Oral    SpO2: 99% 90% 92%   Weight:  76.3 kg        Data Reviewed:  Basic Metabolic Panel: Recent Labs  Lab 09/03/23 1954 09/04/23 0640 09/05/23 0251  NA 129* 132* 130*  K 4.6 4.3 4.4  CL 97* 101 100  CO2 18* 19* 20*  GLUCOSE 136* 115* 150*  BUN 26* 25* 28*  CREATININE 1.22* 1.19* 1.07*  CALCIUM  8.6* 8.8* 8.9    CBC: Recent Labs  Lab 09/03/23 1651 09/04/23 0640 09/05/23 0251  WBC  19.4* 17.4* 16.7*  NEUTROABS 15.8*  --   --   HGB 12.8 10.8* 10.5*  HCT 37.9 32.3* 31.2*  MCV 82.8 83.2 82.5  PLT 723* 593* 546*    LFT Recent Labs  Lab 09/03/23 1954 09/04/23 0640  AST 25 16  ALT 14 15  ALKPHOS 88 87  BILITOT 2.0* 1.1  PROT 6.5 6.4*  ALBUMIN 2.4* 2.4*     Antibiotics: Anti-infectives (From admission, onward)    Start     Dose/Rate Route Frequency Ordered Stop   09/04/23 1815  cefTRIAXone  (ROCEPHIN ) 2 g in sodium chloride  0.9 % 100 mL IVPB  Status:  Discontinued        2 g 200 mL/hr over 30 Minutes Intravenous Every 24 hours 09/03/23 2201 09/04/23 1123   09/04/23 1815  azithromycin  (ZITHROMAX ) tablet 500 mg  Status:  Discontinued        500 mg Oral Daily 09/03/23 2201 09/04/23 1123   09/04/23 1815  azithromycin  (ZITHROMAX ) tablet 500 mg        500 mg Oral Daily 09/04/23 1123 09/12/23 0959   09/04/23 1815  cefTRIAXone  (ROCEPHIN ) 2 g in sodium chloride  0.9 % 100 mL IVPB        2 g 200 mL/hr over 30 Minutes Intravenous Every 24 hours 09/04/23 1123 09/12/23 1814   09/03/23 1815  cefTRIAXone  (ROCEPHIN ) 2 g in sodium chloride  0.9 % 100 mL IVPB        2 g 200 mL/hr over 30 Minutes Intravenous  Once 09/03/23 1811 09/03/23 1913   09/03/23 1815  azithromycin  (ZITHROMAX ) 500 mg in sodium chloride  0.9 % 250 mL IVPB        500 mg 250 mL/hr over 60 Minutes Intravenous  Once 09/03/23 1811 09/03/23 2016        DVT prophylaxis: Heparin   Code Status: DNR  Family Communication: No family at bedside   CONSULTS    Subjective   Complains of worsening shortness of breath this morning.   Objective    Physical Examination:   Appears in mild respiratory distress S1-S2, regular Lungs-bilateral rhonchi auscultated Abdomen is soft, nontender Extremities no edema  Status is: Inpatient:             Megan Pitts   [facility] If 7PM-7AM, please contact night-coverage at [email or web address], Office  [phone]   09/05/2023, 8:35 AM  LOS: 2  days

## 2023-09-06 ENCOUNTER — Institutional Professional Consult (permissible substitution) (HOSPITAL_COMMUNITY): Payer: Self-pay

## 2023-09-06 ENCOUNTER — Institutional Professional Consult (permissible substitution)
Admission: AD | Admit: 2023-09-06 | Discharge: 2023-10-08 | Disposition: A | Discharge status: Skilled Nursing Facility | Payer: Self-pay | Source: Ambulatory Visit | Attending: Internal Medicine | Admitting: Internal Medicine

## 2023-09-06 DIAGNOSIS — I5032 Chronic diastolic (congestive) heart failure: Secondary | ICD-10-CM | POA: Diagnosis not present

## 2023-09-06 DIAGNOSIS — Z7401 Bed confinement status: Secondary | ICD-10-CM | POA: Diagnosis not present

## 2023-09-06 DIAGNOSIS — I5043 Acute on chronic combined systolic (congestive) and diastolic (congestive) heart failure: Secondary | ICD-10-CM | POA: Diagnosis not present

## 2023-09-06 DIAGNOSIS — J984 Other disorders of lung: Secondary | ICD-10-CM | POA: Diagnosis not present

## 2023-09-06 DIAGNOSIS — I517 Cardiomegaly: Secondary | ICD-10-CM | POA: Diagnosis not present

## 2023-09-06 DIAGNOSIS — N1831 Chronic kidney disease, stage 3a: Secondary | ICD-10-CM | POA: Diagnosis not present

## 2023-09-06 DIAGNOSIS — J90 Pleural effusion, not elsewhere classified: Secondary | ICD-10-CM | POA: Diagnosis not present

## 2023-09-06 DIAGNOSIS — J9601 Acute respiratory failure with hypoxia: Secondary | ICD-10-CM | POA: Diagnosis not present

## 2023-09-06 DIAGNOSIS — R918 Other nonspecific abnormal finding of lung field: Secondary | ICD-10-CM | POA: Diagnosis not present

## 2023-09-06 DIAGNOSIS — I2489 Other forms of acute ischemic heart disease: Secondary | ICD-10-CM | POA: Diagnosis not present

## 2023-09-06 DIAGNOSIS — R531 Weakness: Secondary | ICD-10-CM | POA: Diagnosis not present

## 2023-09-06 DIAGNOSIS — J9621 Acute and chronic respiratory failure with hypoxia: Secondary | ICD-10-CM | POA: Diagnosis not present

## 2023-09-06 DIAGNOSIS — I480 Paroxysmal atrial fibrillation: Secondary | ICD-10-CM | POA: Diagnosis not present

## 2023-09-06 DIAGNOSIS — R0902 Hypoxemia: Secondary | ICD-10-CM | POA: Diagnosis not present

## 2023-09-06 DIAGNOSIS — K5900 Constipation, unspecified: Secondary | ICD-10-CM | POA: Diagnosis not present

## 2023-09-06 DIAGNOSIS — D75839 Thrombocytosis, unspecified: Secondary | ICD-10-CM | POA: Diagnosis not present

## 2023-09-06 DIAGNOSIS — I700 Atherosclerosis of aorta: Secondary | ICD-10-CM | POA: Diagnosis not present

## 2023-09-06 DIAGNOSIS — I130 Hypertensive heart and chronic kidney disease with heart failure and stage 1 through stage 4 chronic kidney disease, or unspecified chronic kidney disease: Secondary | ICD-10-CM | POA: Diagnosis not present

## 2023-09-06 DIAGNOSIS — D72829 Elevated white blood cell count, unspecified: Secondary | ICD-10-CM | POA: Diagnosis not present

## 2023-09-06 DIAGNOSIS — J9690 Respiratory failure, unspecified, unspecified whether with hypoxia or hypercapnia: Secondary | ICD-10-CM | POA: Diagnosis not present

## 2023-09-06 DIAGNOSIS — R278 Other lack of coordination: Secondary | ICD-10-CM | POA: Diagnosis not present

## 2023-09-06 DIAGNOSIS — K219 Gastro-esophageal reflux disease without esophagitis: Secondary | ICD-10-CM | POA: Diagnosis not present

## 2023-09-06 DIAGNOSIS — R140 Abdominal distension (gaseous): Secondary | ICD-10-CM | POA: Diagnosis not present

## 2023-09-06 DIAGNOSIS — N183 Chronic kidney disease, stage 3 unspecified: Secondary | ICD-10-CM | POA: Diagnosis not present

## 2023-09-06 DIAGNOSIS — R2681 Unsteadiness on feet: Secondary | ICD-10-CM | POA: Diagnosis not present

## 2023-09-06 DIAGNOSIS — I10 Essential (primary) hypertension: Secondary | ICD-10-CM | POA: Diagnosis not present

## 2023-09-06 DIAGNOSIS — E039 Hypothyroidism, unspecified: Secondary | ICD-10-CM | POA: Diagnosis not present

## 2023-09-06 DIAGNOSIS — M6281 Muscle weakness (generalized): Secondary | ICD-10-CM | POA: Diagnosis not present

## 2023-09-06 DIAGNOSIS — J9691 Respiratory failure, unspecified with hypoxia: Secondary | ICD-10-CM | POA: Diagnosis not present

## 2023-09-06 DIAGNOSIS — R072 Precordial pain: Secondary | ICD-10-CM | POA: Diagnosis not present

## 2023-09-06 DIAGNOSIS — J189 Pneumonia, unspecified organism: Secondary | ICD-10-CM | POA: Diagnosis not present

## 2023-09-06 DIAGNOSIS — Z66 Do not resuscitate: Secondary | ICD-10-CM | POA: Diagnosis not present

## 2023-09-06 DIAGNOSIS — R413 Other amnesia: Secondary | ICD-10-CM | POA: Diagnosis not present

## 2023-09-06 DIAGNOSIS — J181 Lobar pneumonia, unspecified organism: Secondary | ICD-10-CM | POA: Diagnosis not present

## 2023-09-06 LAB — BASIC METABOLIC PANEL WITH GFR
Anion gap: 16 — ABNORMAL HIGH (ref 5–15)
BUN: 42 mg/dL — ABNORMAL HIGH (ref 8–23)
CO2: 18 mmol/L — ABNORMAL LOW (ref 22–32)
Calcium: 9.5 mg/dL (ref 8.9–10.3)
Chloride: 100 mmol/L (ref 98–111)
Creatinine, Ser: 1.19 mg/dL — ABNORMAL HIGH (ref 0.44–1.00)
GFR, Estimated: 43 mL/min — ABNORMAL LOW (ref >60)
Glucose, Bld: 169 mg/dL — ABNORMAL HIGH (ref 70–99)
Potassium: 4.3 mmol/L (ref 3.5–5.1)
Sodium: 134 mmol/L — ABNORMAL LOW (ref 135–145)

## 2023-09-06 MED ORDER — SODIUM CHLORIDE 0.9 % IV SOLN: 2.0000 g | INTRAVENOUS | Status: AC

## 2023-09-06 MED ORDER — FUROSEMIDE 40 MG PO TABS: 40.0000 mg | ORAL_TABLET | Freq: Every day | ORAL | Status: DC

## 2023-09-06 MED ORDER — IPRATROPIUM BROMIDE 0.02 % IN SOLN: 0.5000 mg | Freq: Four times a day (QID) | RESPIRATORY_TRACT | Status: DC

## 2023-09-06 MED ORDER — METHYLPREDNISOLONE SODIUM SUCC 40 MG IJ SOLR: 40.0000 mg | Freq: Two times a day (BID) | INTRAMUSCULAR | Status: DC

## 2023-09-06 MED ORDER — BUDESONIDE 0.25 MG/2ML IN SUSP: 0.2500 mg | Freq: Two times a day (BID) | RESPIRATORY_TRACT | Status: DC

## 2023-09-06 MED ORDER — LEVALBUTEROL HCL 0.63 MG/3ML IN NEBU: 0.6300 mg | INHALATION_SOLUTION | Freq: Four times a day (QID) | RESPIRATORY_TRACT | Status: DC

## 2023-09-06 MED ORDER — ACETYLCYSTEINE 20 % IN SOLN: 3.0000 mL | Freq: Three times a day (TID) | RESPIRATORY_TRACT | Status: AC

## 2023-09-06 MED ORDER — AZITHROMYCIN 250 MG PO TABS: ORAL_TABLET | ORAL | Status: DC

## 2023-09-06 MED ORDER — GUAIFENESIN-DM 100-10 MG/5ML PO SYRP: 5.0000 mL | ORAL_SOLUTION | ORAL | Status: DC | PRN

## 2023-09-06 NOTE — TOC Transition Note (Addendum)
 Transition of Care [facility]) - Discharge Note   Patient Details  Name: Megan Pitts MRN: [id number] Date of Birth: 06-Jul-1927  [facility]) CM/SW Contact:  Waddell Barnie Rama, RN Phone Number: 09/06/2023, 1:29 PM   Clinical Narrative:    For dc today, patient is going to [facility].  Room: 3 ,Attending: Dr. Fleeta Finger MD , Report and RN Report: [phone] .         Patient Goals and CMS Choice            Discharge Placement                       Discharge Plan and Services Additional resources added to the After Visit Summary for                                       Social Drivers of Health (SDOH) Interventions SDOH Screenings   Food Insecurity: No Food Insecurity (09/04/2023)  Housing: Unknown (09/04/2023)  Transportation Needs: No Transportation Needs (09/04/2023)  Utilities: Not At Risk (09/04/2023)  Alcohol  Screen: Low Risk  (06/30/2019)  Depression (PHQ2-9): Low Risk  (12/08/2020)  Financial Resource Strain: Low Risk  (06/30/2019)  Physical Activity: Inactive (06/30/2019)  Social Connections: Unknown (09/04/2023)  Recent Concern: Social Connections - Moderately Isolated (06/11/2023)  Stress: No Stress Concern Present (06/30/2019)  Tobacco Use: Low Risk  (09/03/2023)     Readmission Risk Interventions    09/04/2023    3:03 PM  Readmission Risk Prevention Plan  Transportation Screening Complete  PCP or Specialist Appt within 5-7 Days Complete  Home Care Screening Complete  Medication Review (RN CM) Complete

## 2023-09-06 NOTE — Progress Notes (Addendum)
 Report given to Nena, RN at [facility].   Daughter Lysbeth notified of pt transfer.

## 2023-09-06 NOTE — Discharge Summary (Addendum)
 Physician Discharge Summary   Patient: Megan Pitts MRN: [id number] DOB: 05-02-1927  Admit date:     09/03/2023  Discharge date: 09/06/23  Discharge Physician: Megan Pitts   PCP: Megan Elsie [name], MD   Recommendations at discharge:   [facility]  Discharge Diagnoses: Principal Problem:   Multifocal pneumonia Active Problems:   Chronic diastolic CHF (congestive heart failure) (HCC)   Paroxysmal atrial fibrillation (HCC)   Essential hypertension   CKD (chronic kidney disease), stage III (HCC)   Hypothyroidism   Thrombocytosis   Memory impairment  Resolved Problems:   * No resolved hospital problems. *  Hospital Course: 89 yo female with the past medical history of hypertension, paroxysmal atrial fibrillation, aortic stenosis sp AVR, CKD, heart failure, and memory loss who presented with dyspnea.  Reported 3 days of cough, rhinorrhea, and sore throat, along with dyspnea on exertion.  She was evaluated in the outpatient clinic on the day of admission, and she was found hypoxemic with 02 saturation 81% on room air. EMS was called she was placed on  6 L/min per [place] and was transported to the ED.  On her initial physical examination her blood pressure was 126/102. 129/79, HR 87, RR 32 and 02 saturation 93% on supplemental 02 per [place] Lungs with bilateral rhonchi, rales and wheezing, heart with S1 and S2 present and regular, abdomen with no distention, no lower extremity edema.    Na 129, K 4.6 Cl 97 bicarbonate 18 glucose 136 bun 26 cr 1,22 AST 25 ALT 14  BNP 561  Lactic acid 1,5  Procalcitonin 0,25 Wbc 19,4 hgb 12.8 plt 723    Influenza negative RSV negative SARS covid 19 negative   Assessment and Plan:   Multifocal pneumonia Multifocal pneumonia, community acquired, complicated with severe sepsis present on admission.  Acute hypoxemic respirator failure  Wbc 16.7 Started on broad-spectrum antibiotic with Rocephin   and Zithromax ; continue IV antibiotics for 5 more days -Complains of anxiety with albuterol , will change Xopenex  every 6 hours scheduled -Continue Atrovent  every 6 hours, add Pulmicort  nebulizer twice daily -Increase Solu-Medrol  to 40 mg IV twice daily; can switch to p.o. prednisone in next few days -BNP elevated to 569, will give 1 dose of Lasix  20 mg IV, echocardiogram shows grade 2 diastolic dysfunction -Wean off oxygen as tolerated -Flutter valve, will add Mucomyst  nebulizer twice daily Check urinary antigen legionella and streptococcal pneumonia    Chronic diastolic CHF (congestive heart failure) (HCC) 2020 echocardiogram with preserved LV systolic function with EF > 65%, RV systolic function preserved, LA normal size, aortic valve functioning well.  Started on 1 dose of Lasix  20 mg IV as above -Diuresed well with IV Lasix .  Will discharge on Lasix  40 mg p.o. daily   Paroxysmal atrial fibrillation [facility]) Patient currently on sinus rhythm Continue telemetry monitoring  Not on anticoagulation at home.    Essential hypertension Continue blood pressure control with amlodipine   Continue blood pressure monitoring  Continue olmesartan.  Will discontinue hydrochlorothiazide    CKD (chronic kidney disease), stage III (HCC) Creatinine stable   Hypothyroidism Continue with levothyroxine     Thrombocytosis Follow cell count.    Memory impairment No confusion or agitation Continue donepezil .  Positive decreased hearing       Consultants:  Procedures performed:   Disposition: Long term care facility Diet recommendation:  Discharge Diet Orders (From admission, onward)     Start     Ordered   09/06/23  0000  Diet - low sodium heart healthy        09/06/23 1318           Regular diet DISCHARGE MEDICATION: Allergies as of 09/06/2023   No Active Allergies      Medication List     STOP taking these medications    celecoxib 100 MG capsule Commonly known as: CELEBREX    hydrochlorothiazide  25 MG tablet Commonly known as: HYDRODIURIL        TAKE these medications    acetylcysteine  20 % nebulizer solution Commonly known as: MUCOMYST  Take 3 mLs by nebulization 3 (three) times daily for 3 days.   amLODipine  10 MG tablet Commonly known as: NORVASC  Take 10 mg by mouth daily.   aspirin  EC 81 MG tablet Take 81 mg by mouth daily. Swallow whole.   azithromycin  250 MG tablet Commonly known as: ZITHROMAX  Take 1 tab daily Start taking on: September 07, 2023   budesonide  0.25 MG/2ML nebulizer solution Commonly known as: PULMICORT  Take 2 mLs (0.25 mg total) by nebulization 2 (two) times daily.   cefTRIAXone  2 g in sodium chloride  0.9 % 100 mL Inject 2 g into the vein daily for 5 days.   donepezil  10 MG tablet Commonly known as: ARICEPT  Take 10 mg by mouth at bedtime.   furosemide  40 MG tablet Commonly known as: Lasix  Take 1 tablet (40 mg total) by mouth daily.   guaiFENesin -dextromethorphan  100-10 MG/5ML syrup Commonly known as: ROBITUSSIN DM Take 5 mLs by mouth every 4 (four) hours as needed for cough.   ipratropium 0.02 % nebulizer solution Commonly known as: ATROVENT  Take 2.5 mLs (0.5 mg total) by nebulization every 6 (six) hours.   levalbuterol  0.63 MG/3ML nebulizer solution Commonly known as: XOPENEX  Take 3 mLs (0.63 mg total) by nebulization every 6 (six) hours.   levothyroxine  50 MCG tablet Commonly known as: SYNTHROID  Take 50 mcg by mouth daily.   methylPREDNISolone  sodium succinate 40 mg/mL injection Commonly known as: SOLU-MEDROL  Inject 1 mL (40 mg total) into the vein every 12 (twelve) hours.   olmesartan 40 MG tablet Commonly known as: BENICAR Take 40 mg by mouth daily.   Vitamin D  (Ergocalciferol ) 1.25 MG (50000 UNIT) Caps capsule Commonly known as: DRISDOL  Take 50,000 Units by mouth every Wednesday. On Wednesday        Follow-up Information     Megan Elsie [name], MD Follow up.   Specialty: Internal  Medicine Contact information: [address] [place] [address] [phone]                Discharge Exam: Megan Pitts   09/05/23 0700 09/06/23 0440  Weight: 76.3 kg 77.3 kg   Appears in no acute distress S1-S2, regular Lungs clear to auscultation bilaterally Abdomen is soft, nontender  Condition at discharge: Stable  The results of significant diagnostics from this hospitalization (including imaging, microbiology, ancillary and laboratory) are listed below for reference.   Imaging Studies: DG Chest Port 1V same Day Result Date: 09/05/2023 CLINICAL DATA:  Pneumonia. EXAM: PORTABLE CHEST 1 VIEW COMPARISON:  09/03/2023. FINDINGS: Multifocal opacities throughout the left lung and the right upper and mid lung zones, not significantly changed. Stable cardiomediastinal contours. Median sternotomy. No sizable pleural effusion or pneumothorax. Visualized osseous structures are unchanged. IMPRESSION: No significant change in multifocal airspace disease throughout the left lung and the right upper and mid lung zones, concerning for multifocal pneumonia. Electronically Signed   By: Harrietta Sherry M.D.   On: 09/05/2023 13:02  ECHOCARDIOGRAM COMPLETE Result Date: 09/04/2023    ECHOCARDIOGRAM REPORT   Patient Name:   Yeily KRISTIANE MORSCH Date of Exam: 09/04/2023 Medical Rec #:  [id number]   Height:       63.0 in Accession #:    [id number]  Weight:       173.3 lb Date of Birth:  09-08-1927  BSA:          1.819 m Patient Age:    89 years    BP:           162/45 mmHg Patient Gender: F           HR:           86 bpm. Exam Location:  Inpatient Procedure: 2D Echo, Cardiac Doppler and Color Doppler (Both Spectral and Color            Flow Doppler were utilized during procedure). Indications:    Dyspnea R06.00  History:        Patient has prior history of Echocardiogram examinations, most                 recent 06/20/2018. Aortic Valve Disease, Arrythmias:Atrial                 Fibrillation; Risk  Factors:Hypertension.  Sonographer:    Jayson Gaskins Referring Phys: [id number] MAURICIO DANIEL ARRIEN IMPRESSIONS  1. Left ventricular ejection fraction, by estimation, is 60 to 65%. The left ventricle has normal function. The left ventricle has no regional wall motion abnormalities. There is mild concentric left ventricular hypertrophy. Left ventricular diastolic parameters are consistent with Grade II diastolic dysfunction (pseudonormalization).  2. Right ventricular systolic function is normal. The right ventricular size is normal. There is moderately elevated pulmonary artery systolic pressure. The estimated right ventricular systolic pressure is 52.1 mmHg.  3. Left atrial size was mildly dilated.  4. The mitral valve is degenerative. Mild mitral valve regurgitation. Moderate mitral stenosis. The mean mitral valve gradient is 7.0 mmHg, MVA 1.96 cm^2 by VTI. Severe mitral annular calcification.  5. Tricuspid valve regurgitation is mild to moderate.  6. Status post bioprosthetic aortic valve replacement. Mean gradient 6 mmHg, no perivalvular leakage noted.  7. The inferior vena cava is normal in size with <50% respiratory variability, suggesting right atrial pressure of 8 mmHg. FINDINGS  Left Ventricle: Left ventricular ejection fraction, by estimation, is 60 to 65%. The left ventricle has normal function. The left ventricle has no regional wall motion abnormalities. The left ventricular internal cavity size was normal in size. There is  mild concentric left ventricular hypertrophy. Left ventricular diastolic parameters are consistent with Grade II diastolic dysfunction (pseudonormalization). Right Ventricle: The right ventricular size is normal. No increase in right ventricular wall thickness. Right ventricular systolic function is normal. There is moderately elevated pulmonary artery systolic pressure. The tricuspid regurgitant velocity is 3.32 m/s, and with an assumed right atrial pressure of 8 mmHg, the estimated  right ventricular systolic pressure is 52.1 mmHg. Left Atrium: Left atrial size was mildly dilated. Right Atrium: Right atrial size was normal in size. Pericardium: There is no evidence of pericardial effusion. Presence of epicardial fat layer. Mitral Valve: The mitral valve is degenerative in appearance. There is moderate thickening of the mitral valve leaflet(s). Severe mitral annular calcification. Mild mitral valve regurgitation. Moderate mitral valve stenosis. MV peak gradient, 11.7 mmHg. The mean mitral valve gradient is 7.0 mmHg. Tricuspid Valve: The tricuspid valve is grossly normal. Tricuspid valve regurgitation is mild to moderate. Aortic  Valve: Status post bioprosthetic aortic valve replacement. Mean gradient 6 mmHg, no perivalvular leakage noted. The aortic valve has been repaired/replaced. Aortic valve regurgitation is not visualized. Aortic valve mean gradient measures 6.0 mmHg. Aortic valve peak gradient measures 13.8 mmHg. Aortic valve area, by VTI measures 2.59 cm. There is a 23 mm bioprosthetic valve present in the aortic position. Pulmonic Valve: The pulmonic valve was grossly normal. Pulmonic valve regurgitation is trivial. Aorta: The aortic root is normal in size and structure. Venous: The inferior vena cava is normal in size with less than 50% respiratory variability, suggesting right atrial pressure of 8 mmHg. IAS/Shunts: No atrial level shunt detected by color flow Doppler.  LEFT VENTRICLE PLAX 2D LVIDd:         4.70 cm   Diastology LVIDs:         2.70 cm   LV e' medial:    5.33 cm/s LV PW:         0.60 cm   LV E/e' medial:  29.3 LV IVS:        0.80 cm   LV e' lateral:   8.27 cm/s LVOT diam:     1.80 cm   LV E/e' lateral: 18.9 LV SV:         87 LV SV Index:   48 LVOT Area:     2.54 cm  RIGHT VENTRICLE RV S prime:     13.50 cm/s TAPSE (M-mode): 1.4 cm LEFT ATRIUM             Index LA Vol (A2C):   55.3 ml 30.39 ml/m LA Vol (A4C):   53.5 ml 29.41 ml/m LA Biplane Vol: 54.4 ml 29.90 ml/m   AORTIC VALVE AV Area (Vmax):    2.07 cm AV Area (Vmean):   2.46 cm AV Area (VTI):     2.59 cm AV Vmax:           186.00 cm/s AV Vmean:          114.000 cm/s AV VTI:            0.335 m AV Peak Grad:      13.8 mmHg AV Mean Grad:      6.0 mmHg LVOT Vmax:         151.00 cm/s LVOT Vmean:        110.000 cm/s LVOT VTI:          0.341 m LVOT/AV VTI ratio: 1.02  AORTA Ao Root diam: 2.20 cm MITRAL VALVE                TRICUSPID VALVE MV Area (PHT): 2.99 cm     TR Peak grad:   44.1 mmHg MV Area VTI:   1.92 cm     TR Vmax:        332.00 cm/s MV Peak grad:  11.7 mmHg MV Mean grad:  7.0 mmHg     SHUNTS MV Vmax:       1.71 m/s     Systemic VTI:  0.34 m MV Vmean:      127.0 cm/s   Systemic Diam: 1.80 cm MV Decel Time: 254 msec MV E velocity: 156.00 cm/s MV A velocity: 150.00 cm/s MV E/A ratio:  1.04 Dalton McleanMD Electronically signed by Ezra Kanner Signature Date/Time: 09/04/2023/5:29:09 PM    Final    DG Chest Portable 1 View Result Date: 09/03/2023 CLINICAL DATA:  Shortness of breath. EXAM: PORTABLE CHEST 1 VIEW COMPARISON:  September 24, 2017 FINDINGS: Multiple sternal  wires are present. The heart size and mediastinal contours are within normal limits. There is marked severity calcification of the thoracic aorta. Mild to moderate severity right upper lobe, right infrahilar, mid left lung and left basilar infiltrates are seen. No pleural effusion or pneumothorax is identified. The visualized skeletal structures are unremarkable. IMPRESSION: Mild to moderate severity bilateral infiltrates, as described above. Electronically Signed   By: Suzen Dials M.D.   On: 09/03/2023 18:06    Microbiology: Results for orders placed or performed during the hospital encounter of 09/03/23  Resp panel by RT-PCR (RSV, Flu A&B, Covid) Anterior Nasal Swab     Status: None   Collection Time: 09/03/23  4:51 PM   Specimen: Anterior Nasal Swab  Result Value Ref Range Status   SARS Coronavirus 2 by RT PCR NEGATIVE NEGATIVE  Final   Influenza A by PCR NEGATIVE NEGATIVE Final   Influenza B by PCR NEGATIVE NEGATIVE Final    Comment: (NOTE) The Xpert Xpress SARS-CoV-2/FLU/RSV plus assay is intended as an aid in the diagnosis of influenza from Nasopharyngeal swab specimens and should not be used as a sole basis for treatment. Nasal washings and aspirates are unacceptable for Xpert Xpress SARS-CoV-2/FLU/RSV testing.  Fact Sheet for Patients: BloggerCourse.com  Fact Sheet for Healthcare Providers: SeriousBroker.it  This test is not yet approved or cleared by the [place]  FDA and has been authorized for detection and/or diagnosis of SARS-CoV-2 by FDA under an Emergency Use Authorization (EUA). This EUA will remain in effect (meaning this test can be used) for the duration of the COVID-19 declaration under Section 564(b)(1) of the Act, 21 U.S.C. section 360bbb-3(b)(1), unless the authorization is terminated or revoked.     Resp Syncytial Virus by PCR NEGATIVE NEGATIVE Final    Comment: (NOTE) Fact Sheet for Patients: BloggerCourse.com  Fact Sheet for Healthcare Providers: SeriousBroker.it  This test is not yet approved or cleared by the [place]  FDA and has been authorized for detection and/or diagnosis of SARS-CoV-2 by FDA under an Emergency Use Authorization (EUA). This EUA will remain in effect (meaning this test can be used) for the duration of the COVID-19 declaration under Section 564(b)(1) of the Act, 21 U.S.C. section 360bbb-3(b)(1), unless the authorization is terminated or revoked.  Performed at [facility], [address], [place], [place] [address]   Respiratory (~20 pathogens) panel by PCR     Status: None   Collection Time: 09/03/23  4:51 PM   Specimen: Nasopharyngeal Swab; Respiratory  Result Value Ref Range Status   Adenovirus NOT DETECTED NOT DETECTED Final    Coronavirus 229E NOT DETECTED NOT DETECTED Final    Comment: (NOTE) The Coronavirus on the Respiratory Panel, DOES NOT test for the novel  Coronavirus (2019 nCoV)    Coronavirus HKU1 NOT DETECTED NOT DETECTED Final   Coronavirus NL63 NOT DETECTED NOT DETECTED Final   Coronavirus OC43 NOT DETECTED NOT DETECTED Final   Metapneumovirus NOT DETECTED NOT DETECTED Final   Rhinovirus / Enterovirus NOT DETECTED NOT DETECTED Final   Influenza A NOT DETECTED NOT DETECTED Final   Influenza B NOT DETECTED NOT DETECTED Final   Parainfluenza Virus 1 NOT DETECTED NOT DETECTED Final   Parainfluenza Virus 2 NOT DETECTED NOT DETECTED Final   Parainfluenza Virus 3 NOT DETECTED NOT DETECTED Final   Parainfluenza Virus 4 NOT DETECTED NOT DETECTED Final   Respiratory Syncytial Virus NOT DETECTED NOT DETECTED Final   Bordetella pertussis NOT DETECTED NOT DETECTED Final   Bordetella Parapertussis  NOT DETECTED NOT DETECTED Final   Chlamydophila pneumoniae NOT DETECTED NOT DETECTED Final   Mycoplasma pneumoniae NOT DETECTED NOT DETECTED Final    Comment: Performed at [facility], [address], [place], [place] [address]    Labs: CBC: Recent Labs  Lab 09/03/23 1651 09/04/23 0640 09/05/23 0251  WBC 19.4* 17.4* 16.7*  NEUTROABS 15.8*  --   --   HGB 12.8 10.8* 10.5*  HCT 37.9 32.3* 31.2*  MCV 82.8 83.2 82.5  PLT 723* 593* 546*   Basic Metabolic Panel: Recent Labs  Lab 09/03/23 1954 09/04/23 0640 09/05/23 0251 09/06/23 1039  NA 129* 132* 130* 134*  K 4.6 4.3 4.4 4.3  CL 97* 101 100 100  CO2 18* 19* 20* 18*  GLUCOSE 136* 115* 150* 169*  BUN 26* 25* 28* 42*  CREATININE 1.22* 1.19* 1.07* 1.19*  CALCIUM  8.6* 8.8* 8.9 9.5   Liver Function Tests: Recent Labs  Lab 09/03/23 1954 09/04/23 0640  AST 25 16  ALT 14 15  ALKPHOS 88 87  BILITOT 2.0* 1.1  PROT 6.5 6.4*  ALBUMIN 2.4* 2.4*   CBG: No results for input(s): GLUCAP in the last 168 hours.  Discharge time spent: Greater than  30 minutes.  Signed: [name], MD [facility] 09/06/2023

## 2023-09-06 NOTE — TOC Progression Note (Signed)
 Transition of Care [facility]) - Progression Note    Patient Details  Name: Megan Pitts MRN: [id number] Date of Birth: 03-16-1927  Transition of [facility]) CM/SW Contact  Waddell Barnie Rama, RN Phone Number: 09/06/2023, 1:27 PM  Clinical Narrative:    MD has agreed that patient will be good for [facility],  MD spoke with patient family and they are in agreement with [facility]  ,  [facility] with [facility] has bed today to take patient.                       Expected Discharge Plan and Services         Expected Discharge Date: 09/06/23                                     Social Drivers of Health (SDOH) Interventions SDOH Screenings   Food Insecurity: No Food Insecurity (09/04/2023)  Housing: Unknown (09/04/2023)  Transportation Needs: No Transportation Needs (09/04/2023)  Utilities: Not At Risk (09/04/2023)  Alcohol  Screen: Low Risk  (06/30/2019)  Depression (PHQ2-9): Low Risk  (12/08/2020)  Financial Resource Strain: Low Risk  (06/30/2019)  Physical Activity: Inactive (06/30/2019)  Social Connections: Unknown (09/04/2023)  Recent Concern: Social Connections - Moderately Isolated (06/11/2023)  Stress: No Stress Concern Present (06/30/2019)  Tobacco Use: Low Risk  (09/03/2023)    Readmission Risk Interventions    09/04/2023    3:03 PM  Readmission Risk Prevention Plan  Transportation Screening Complete  PCP or Specialist Appt within 5-7 Days Complete  Home Care Screening Complete  Medication Review (RN CM) Complete

## 2023-09-06 NOTE — Plan of Care (Signed)
   Problem: Nutrition: Goal: Adequate nutrition will be maintained Outcome: Progressing   Problem: Coping: Goal: Level of anxiety will decrease Outcome: Progressing   Problem: Safety: Goal: Ability to remain free from injury will improve Outcome: Progressing

## 2023-09-07 DIAGNOSIS — I5043 Acute on chronic combined systolic (congestive) and diastolic (congestive) heart failure: Secondary | ICD-10-CM | POA: Diagnosis not present

## 2023-09-07 DIAGNOSIS — J181 Lobar pneumonia, unspecified organism: Secondary | ICD-10-CM | POA: Diagnosis not present

## 2023-09-07 DIAGNOSIS — J9621 Acute and chronic respiratory failure with hypoxia: Secondary | ICD-10-CM | POA: Diagnosis not present

## 2023-09-07 DIAGNOSIS — I480 Paroxysmal atrial fibrillation: Secondary | ICD-10-CM | POA: Diagnosis not present

## 2023-09-07 LAB — COMPREHENSIVE METABOLIC PANEL WITH GFR
ALT: 45 U/L — ABNORMAL HIGH (ref 0–44)
AST: 42 U/L — ABNORMAL HIGH (ref 15–41)
Albumin: 2.4 g/dL — ABNORMAL LOW (ref 3.5–5.0)
Alkaline Phosphatase: 104 U/L (ref 38–126)
Anion gap: 15 (ref 5–15)
BUN: 50 mg/dL — ABNORMAL HIGH (ref 8–23)
CO2: 20 mmol/L — ABNORMAL LOW (ref 22–32)
Calcium: 9.3 mg/dL (ref 8.9–10.3)
Chloride: 100 mmol/L (ref 98–111)
Creatinine, Ser: 1.15 mg/dL — ABNORMAL HIGH (ref 0.44–1.00)
GFR, Estimated: 44 mL/min — ABNORMAL LOW (ref >60)
Glucose, Bld: 158 mg/dL — ABNORMAL HIGH (ref 70–99)
Potassium: 4.9 mmol/L (ref 3.5–5.1)
Sodium: 135 mmol/L (ref 135–145)
Total Bilirubin: 0.5 mg/dL (ref 0.0–1.2)
Total Protein: 6.1 g/dL — ABNORMAL LOW (ref 6.5–8.1)

## 2023-09-07 LAB — CBC WITH DIFFERENTIAL/PLATELET
Abs Immature Granulocytes: 0.23 K/uL — ABNORMAL HIGH (ref 0.00–0.07)
Basophils Absolute: 0 K/uL (ref 0.0–0.1)
Basophils Relative: 0 %
Eosinophils Absolute: 0 K/uL (ref 0.0–0.5)
Eosinophils Relative: 0 %
HCT: 31.8 % — ABNORMAL LOW (ref 36.0–46.0)
Hemoglobin: 10.7 g/dL — ABNORMAL LOW (ref 12.0–15.0)
Immature Granulocytes: 1 %
Lymphocytes Relative: 3 %
Lymphs Abs: 0.7 K/uL (ref 0.7–4.0)
MCH: 27.4 pg (ref 26.0–34.0)
MCHC: 33.6 g/dL (ref 30.0–36.0)
MCV: 81.5 fL (ref 80.0–100.0)
Monocytes Absolute: 1.1 K/uL — ABNORMAL HIGH (ref 0.1–1.0)
Monocytes Relative: 5 %
Neutro Abs: 20.2 K/uL — ABNORMAL HIGH (ref 1.7–7.7)
Neutrophils Relative %: 91 %
Platelets: 628 K/uL — ABNORMAL HIGH (ref 150–400)
RBC: 3.9 MIL/uL (ref 3.87–5.11)
RDW: 14.3 % (ref 11.5–15.5)
WBC: 22.2 K/uL — ABNORMAL HIGH (ref 4.0–10.5)
nRBC: 0 % (ref 0.0–0.2)

## 2023-09-09 ENCOUNTER — Institutional Professional Consult (permissible substitution) (HOSPITAL_COMMUNITY): Payer: Self-pay

## 2023-09-09 LAB — CBC WITH DIFFERENTIAL/PLATELET
Abs Immature Granulocytes: 0.31 K/uL — ABNORMAL HIGH (ref 0.00–0.07)
Basophils Absolute: 0 K/uL (ref 0.0–0.1)
Basophils Relative: 0 %
Eosinophils Absolute: 0 K/uL (ref 0.0–0.5)
Eosinophils Relative: 0 %
HCT: 33.1 % — ABNORMAL LOW (ref 36.0–46.0)
Hemoglobin: 10.6 g/dL — ABNORMAL LOW (ref 12.0–15.0)
Immature Granulocytes: 2 %
Lymphocytes Relative: 4 %
Lymphs Abs: 0.8 K/uL (ref 0.7–4.0)
MCH: 27.1 pg (ref 26.0–34.0)
MCHC: 32 g/dL (ref 30.0–36.0)
MCV: 84.7 fL (ref 80.0–100.0)
Monocytes Absolute: 0.7 K/uL (ref 0.1–1.0)
Monocytes Relative: 3 %
Neutro Abs: 18.2 K/uL — ABNORMAL HIGH (ref 1.7–7.7)
Neutrophils Relative %: 91 %
Platelets: 588 K/uL — ABNORMAL HIGH (ref 150–400)
RBC: 3.91 MIL/uL (ref 3.87–5.11)
RDW: 14.2 % (ref 11.5–15.5)
WBC: 20 K/uL — ABNORMAL HIGH (ref 4.0–10.5)
nRBC: 0 % (ref 0.0–0.2)

## 2023-09-09 LAB — COMPREHENSIVE METABOLIC PANEL WITH GFR
ALT: 31 U/L (ref 0–44)
AST: 18 U/L (ref 15–41)
Albumin: 2.3 g/dL — ABNORMAL LOW (ref 3.5–5.0)
Alkaline Phosphatase: 105 U/L (ref 38–126)
Anion gap: 12 (ref 5–15)
BUN: 62 mg/dL — ABNORMAL HIGH (ref 8–23)
CO2: 23 mmol/L (ref 22–32)
Calcium: 9 mg/dL (ref 8.9–10.3)
Chloride: 104 mmol/L (ref 98–111)
Creatinine, Ser: 1.25 mg/dL — ABNORMAL HIGH (ref 0.44–1.00)
GFR, Estimated: 40 mL/min — ABNORMAL LOW (ref >60)
Glucose, Bld: 172 mg/dL — ABNORMAL HIGH (ref 70–99)
Potassium: 4.8 mmol/L (ref 3.5–5.1)
Sodium: 139 mmol/L (ref 135–145)
Total Bilirubin: 0.7 mg/dL (ref 0.0–1.2)
Total Protein: 5.8 g/dL — ABNORMAL LOW (ref 6.5–8.1)

## 2023-09-10 ENCOUNTER — Institutional Professional Consult (permissible substitution) (HOSPITAL_COMMUNITY): Payer: Self-pay

## 2023-09-10 DIAGNOSIS — I5043 Acute on chronic combined systolic (congestive) and diastolic (congestive) heart failure: Secondary | ICD-10-CM | POA: Diagnosis not present

## 2023-09-10 DIAGNOSIS — J181 Lobar pneumonia, unspecified organism: Secondary | ICD-10-CM | POA: Diagnosis not present

## 2023-09-10 DIAGNOSIS — J9621 Acute and chronic respiratory failure with hypoxia: Secondary | ICD-10-CM | POA: Diagnosis not present

## 2023-09-10 DIAGNOSIS — I480 Paroxysmal atrial fibrillation: Secondary | ICD-10-CM | POA: Diagnosis not present

## 2023-09-11 DIAGNOSIS — I5043 Acute on chronic combined systolic (congestive) and diastolic (congestive) heart failure: Secondary | ICD-10-CM | POA: Diagnosis not present

## 2023-09-11 DIAGNOSIS — J9621 Acute and chronic respiratory failure with hypoxia: Secondary | ICD-10-CM | POA: Diagnosis not present

## 2023-09-11 DIAGNOSIS — J181 Lobar pneumonia, unspecified organism: Secondary | ICD-10-CM | POA: Diagnosis not present

## 2023-09-11 DIAGNOSIS — I480 Paroxysmal atrial fibrillation: Secondary | ICD-10-CM | POA: Diagnosis not present

## 2023-09-11 DIAGNOSIS — J9601 Acute respiratory failure with hypoxia: Secondary | ICD-10-CM | POA: Diagnosis not present

## 2023-09-11 LAB — CBC WITH DIFFERENTIAL/PLATELET
Abs Immature Granulocytes: 0.59 K/uL — ABNORMAL HIGH (ref 0.00–0.07)
Basophils Absolute: 0.1 K/uL (ref 0.0–0.1)
Basophils Relative: 0 %
Eosinophils Absolute: 0 K/uL (ref 0.0–0.5)
Eosinophils Relative: 0 %
HCT: 35.8 % — ABNORMAL LOW (ref 36.0–46.0)
Hemoglobin: 11.6 g/dL — ABNORMAL LOW (ref 12.0–15.0)
Immature Granulocytes: 3 %
Lymphocytes Relative: 5 %
Lymphs Abs: 1.1 K/uL (ref 0.7–4.0)
MCH: 27.4 pg (ref 26.0–34.0)
MCHC: 32.4 g/dL (ref 30.0–36.0)
MCV: 84.4 fL (ref 80.0–100.0)
Monocytes Absolute: 2.2 K/uL — ABNORMAL HIGH (ref 0.1–1.0)
Monocytes Relative: 9 %
Neutro Abs: 19.1 K/uL — ABNORMAL HIGH (ref 1.7–7.7)
Neutrophils Relative %: 83 %
Platelets: 590 K/uL — ABNORMAL HIGH (ref 150–400)
RBC: 4.24 MIL/uL (ref 3.87–5.11)
RDW: 14.4 % (ref 11.5–15.5)
WBC: 23.1 K/uL — ABNORMAL HIGH (ref 4.0–10.5)
nRBC: 0 % (ref 0.0–0.2)

## 2023-09-11 LAB — EXPECTORATED SPUTUM ASSESSMENT W GRAM STAIN, RFLX TO RESP C

## 2023-09-12 DIAGNOSIS — J9621 Acute and chronic respiratory failure with hypoxia: Secondary | ICD-10-CM | POA: Diagnosis not present

## 2023-09-12 DIAGNOSIS — J181 Lobar pneumonia, unspecified organism: Secondary | ICD-10-CM | POA: Diagnosis not present

## 2023-09-12 DIAGNOSIS — I5043 Acute on chronic combined systolic (congestive) and diastolic (congestive) heart failure: Secondary | ICD-10-CM | POA: Diagnosis not present

## 2023-09-12 DIAGNOSIS — I480 Paroxysmal atrial fibrillation: Secondary | ICD-10-CM | POA: Diagnosis not present

## 2023-09-12 DIAGNOSIS — J9601 Acute respiratory failure with hypoxia: Secondary | ICD-10-CM | POA: Diagnosis not present

## 2023-09-13 DIAGNOSIS — I480 Paroxysmal atrial fibrillation: Secondary | ICD-10-CM | POA: Diagnosis not present

## 2023-09-13 DIAGNOSIS — J9601 Acute respiratory failure with hypoxia: Secondary | ICD-10-CM | POA: Diagnosis not present

## 2023-09-13 DIAGNOSIS — I5043 Acute on chronic combined systolic (congestive) and diastolic (congestive) heart failure: Secondary | ICD-10-CM | POA: Diagnosis not present

## 2023-09-13 DIAGNOSIS — J181 Lobar pneumonia, unspecified organism: Secondary | ICD-10-CM | POA: Diagnosis not present

## 2023-09-13 DIAGNOSIS — J9621 Acute and chronic respiratory failure with hypoxia: Secondary | ICD-10-CM | POA: Diagnosis not present

## 2023-09-13 LAB — CBC WITH DIFFERENTIAL/PLATELET
Abs Immature Granulocytes: 0.66 K/uL — ABNORMAL HIGH (ref 0.00–0.07)
Basophils Absolute: 0.1 K/uL (ref 0.0–0.1)
Basophils Relative: 0 %
Eosinophils Absolute: 0 K/uL (ref 0.0–0.5)
Eosinophils Relative: 0 %
HCT: 36.6 % (ref 36.0–46.0)
Hemoglobin: 11.5 g/dL — ABNORMAL LOW (ref 12.0–15.0)
Immature Granulocytes: 4 %
Lymphocytes Relative: 4 %
Lymphs Abs: 0.8 K/uL (ref 0.7–4.0)
MCH: 26.9 pg (ref 26.0–34.0)
MCHC: 31.4 g/dL (ref 30.0–36.0)
MCV: 85.5 fL (ref 80.0–100.0)
Monocytes Absolute: 0.5 K/uL (ref 0.1–1.0)
Monocytes Relative: 3 %
Neutro Abs: 17 K/uL — ABNORMAL HIGH (ref 1.7–7.7)
Neutrophils Relative %: 89 %
Platelets: 508 K/uL — ABNORMAL HIGH (ref 150–400)
RBC: 4.28 MIL/uL (ref 3.87–5.11)
RDW: 14.5 % (ref 11.5–15.5)
WBC: 18.9 K/uL — ABNORMAL HIGH (ref 4.0–10.5)
nRBC: 0 % (ref 0.0–0.2)

## 2023-09-13 LAB — BASIC METABOLIC PANEL WITH GFR
Anion gap: 12 (ref 5–15)
BUN: 56 mg/dL — ABNORMAL HIGH (ref 8–23)
CO2: 21 mmol/L — ABNORMAL LOW (ref 22–32)
Calcium: 9 mg/dL (ref 8.9–10.3)
Chloride: 107 mmol/L (ref 98–111)
Creatinine, Ser: 0.9 mg/dL (ref 0.44–1.00)
GFR, Estimated: 60 mL/min — ABNORMAL LOW (ref >60)
Glucose, Bld: 179 mg/dL — ABNORMAL HIGH (ref 70–99)
Potassium: 4.7 mmol/L (ref 3.5–5.1)
Sodium: 140 mmol/L (ref 135–145)

## 2023-09-14 DIAGNOSIS — I5043 Acute on chronic combined systolic (congestive) and diastolic (congestive) heart failure: Secondary | ICD-10-CM | POA: Diagnosis not present

## 2023-09-14 DIAGNOSIS — J9621 Acute and chronic respiratory failure with hypoxia: Secondary | ICD-10-CM | POA: Diagnosis not present

## 2023-09-14 DIAGNOSIS — I480 Paroxysmal atrial fibrillation: Secondary | ICD-10-CM | POA: Diagnosis not present

## 2023-09-14 DIAGNOSIS — J9601 Acute respiratory failure with hypoxia: Secondary | ICD-10-CM | POA: Diagnosis not present

## 2023-09-14 DIAGNOSIS — J181 Lobar pneumonia, unspecified organism: Secondary | ICD-10-CM | POA: Diagnosis not present

## 2023-09-14 LAB — CULTURE, RESPIRATORY W GRAM STAIN: Culture: NORMAL

## 2023-09-15 DIAGNOSIS — J9601 Acute respiratory failure with hypoxia: Secondary | ICD-10-CM | POA: Diagnosis not present

## 2023-09-15 DIAGNOSIS — I5043 Acute on chronic combined systolic (congestive) and diastolic (congestive) heart failure: Secondary | ICD-10-CM | POA: Diagnosis not present

## 2023-09-15 DIAGNOSIS — J9621 Acute and chronic respiratory failure with hypoxia: Secondary | ICD-10-CM | POA: Diagnosis not present

## 2023-09-15 DIAGNOSIS — I480 Paroxysmal atrial fibrillation: Secondary | ICD-10-CM | POA: Diagnosis not present

## 2023-09-15 DIAGNOSIS — J181 Lobar pneumonia, unspecified organism: Secondary | ICD-10-CM | POA: Diagnosis not present

## 2023-09-15 NOTE — Progress Notes (Signed)
 9/5/20250Respiratory Progress Note Room #:5E20  Admit Date:09/06/2023  Pulmonologist:  Discharge Date:  DX/HX:acute hypoxic respiratory failure, pna, CHF  Isolation:  Code Status:DNR   Airway Type? (Trach/ETT):  Size:  Cuffed/Cuffless?:  Placement at lip?:  High Risk Airway?:  Date of insertion:  Trach changed:  PMV started:  Cap started:  Decannulated/extubated:   Ventilator ID Number: Current ventilator settings:  Current weaning orders:  Date of first wean:  Date of liberation from vent:   Non-invasive Ventilation Current settings:   Home use? Yes/no:  Frequency? HS/PRN/Continuous:   Oxygen Therapy Modality? Nasal Cannula/NRB/Trach Collar/etc.: Optiflow  FiO2: 80  LPM: 45   Treatments Drug: Dose: Frequency: Other:  duoneb 0.5-2.5mg /52ml TID        pulmicort  0.25mg  Daily          RT Progress Date Time Important Event or Update RT Initials  09/06/2023 1841 New admit arrive on 15L HFNC + 100% NRB. Pt placed on Optiflow 100%/35L. NP  09/07/23 0512 100% Heated high flow [place] at 35L and a NRB mask SAP  09/07/2023 1558 Titrated O2 to 89%. Titrate to keep sats > 86% DS  09/07/2023 1826 40L, 85% HHFNC with NRB within reach. HL  09/08/2023 0549 40L, 85% HHFNC, NRB IN BED KN  09/08/2023 1628 HHFNC 40lpm, 80% fio2, w/NRB accessible CMG  09/09/2023 0358 HHFNC 80-85% 40L, NRB KN  09/10/2023 0608 HHFNC 80-85%, NRB KN  09/10/2023 0802 HHF 45L/90% + NRB as needed BP  09/11/2023 0145 HHF 45L/90% + NRB as needed DS  09/11/2023 0550 HHF 45L/90%+NRB as needed BP  09/12/2023 0051 HHF 45L/90% + NRB as needed DS  09/12/2023 0549 HHF 45L / 80%+NRB as needed BP  09/12/2023 2234 HHF 45L / 80%+NRB as needed DS  09/13/2023 1835 HHF 45l/80% FIO2 +NRB CMG  09/14/23 0452 Heated high flow [place], 80%/45L SAP  09/14/2023 1638 HHFNC 45lpm, 80% fio2 +NRB as needed CMG  09/15/23 0455 Heated high flow [place], 80%/40L, NRB not needed SAP  09/15/2023 1815 HHFNC 80% & 40 LPM, SOME NRB SUPPLEMENTATION JM

## 2023-09-16 ENCOUNTER — Institutional Professional Consult (permissible substitution) (HOSPITAL_COMMUNITY): Payer: Self-pay

## 2023-09-16 DIAGNOSIS — J9601 Acute respiratory failure with hypoxia: Secondary | ICD-10-CM | POA: Diagnosis not present

## 2023-09-16 LAB — CBC WITH DIFFERENTIAL/PLATELET
Abs Immature Granulocytes: 0.85 K/uL — ABNORMAL HIGH (ref 0.00–0.07)
Basophils Absolute: 0.1 K/uL (ref 0.0–0.1)
Basophils Relative: 0 %
Eosinophils Absolute: 0.2 K/uL (ref 0.0–0.5)
Eosinophils Relative: 1 %
HCT: 37.6 % (ref 36.0–46.0)
Hemoglobin: 12.2 g/dL (ref 12.0–15.0)
Immature Granulocytes: 4 %
Lymphocytes Relative: 8 %
Lymphs Abs: 1.9 K/uL (ref 0.7–4.0)
MCH: 27.7 pg (ref 26.0–34.0)
MCHC: 32.4 g/dL (ref 30.0–36.0)
MCV: 85.5 fL (ref 80.0–100.0)
Monocytes Absolute: 1.8 K/uL — ABNORMAL HIGH (ref 0.1–1.0)
Monocytes Relative: 8 %
Neutro Abs: 17.5 K/uL — ABNORMAL HIGH (ref 1.7–7.7)
Neutrophils Relative %: 79 %
Platelets: 418 K/uL — ABNORMAL HIGH (ref 150–400)
RBC: 4.4 MIL/uL (ref 3.87–5.11)
RDW: 14.9 % (ref 11.5–15.5)
WBC: 22.3 K/uL — ABNORMAL HIGH (ref 4.0–10.5)
nRBC: 0 % (ref 0.0–0.2)

## 2023-09-16 LAB — BASIC METABOLIC PANEL WITH GFR
Anion gap: 7 (ref 5–15)
BUN: 61 mg/dL — ABNORMAL HIGH (ref 8–23)
CO2: 25 mmol/L (ref 22–32)
Calcium: 9.6 mg/dL (ref 8.9–10.3)
Chloride: 105 mmol/L (ref 98–111)
Creatinine, Ser: 1 mg/dL (ref 0.44–1.00)
GFR, Estimated: 53 mL/min — ABNORMAL LOW (ref >60)
Glucose, Bld: 123 mg/dL — ABNORMAL HIGH (ref 70–99)
Potassium: 4.8 mmol/L (ref 3.5–5.1)
Sodium: 137 mmol/L (ref 135–145)

## 2023-09-16 LAB — CBC
HCT: 40.3 % (ref 36.0–46.0)
Hemoglobin: 12.9 g/dL (ref 12.0–15.0)
MCH: 27.6 pg (ref 26.0–34.0)
MCHC: 32 g/dL (ref 30.0–36.0)
MCV: 86.1 fL (ref 80.0–100.0)
Platelets: 449 K/uL — ABNORMAL HIGH (ref 150–400)
RBC: 4.68 MIL/uL (ref 3.87–5.11)
RDW: 15.2 % (ref 11.5–15.5)
WBC: 25.7 K/uL — ABNORMAL HIGH (ref 4.0–10.5)
nRBC: 0 % (ref 0.0–0.2)

## 2023-09-16 LAB — MAGNESIUM: Magnesium: 1.9 mg/dL (ref 1.7–2.4)

## 2023-09-16 LAB — CK TOTAL AND CKMB (NOT AT ARMC)
CK, MB: 4.3 ng/mL (ref 0.5–5.0)
Total CK: <10 U/L — ABNORMAL LOW (ref 38–234)

## 2023-09-16 LAB — HEPARIN LEVEL (UNFRACTIONATED): Heparin Unfractionated: 0.69 [IU]/mL (ref 0.30–0.70)

## 2023-09-16 LAB — APTT
aPTT: 26 s (ref 24–36)
aPTT: 77 s — ABNORMAL HIGH (ref 24–36)

## 2023-09-16 LAB — TROPONIN I (HIGH SENSITIVITY)
Troponin I (High Sensitivity): 137 ng/L (ref <18)
Troponin I (High Sensitivity): 209 ng/L (ref <18)

## 2023-09-16 LAB — PHOSPHORUS: Phosphorus: 3.8 mg/dL (ref 2.5–4.6)

## 2023-09-16 NOTE — Progress Notes (Signed)
 9/5/20250Respiratory Progress Note Room #:5E20  Admit Date:09/06/2023  Pulmonologist:  Discharge Date:  DX/HX:acute hypoxic respiratory failure, pna, CHF  Isolation:  Code Status:DNR   Airway Type? (Trach/ETT):  Size:  Cuffed/Cuffless?:  Placement at lip?:  High Risk Airway?:  Date of insertion:  Trach changed:  PMV started:  Cap started:  Decannulated/extubated:   Ventilator ID Number: Current ventilator settings:  Current weaning orders:  Date of first wean:  Date of liberation from vent:   Non-invasive Ventilation Current settings:   Home use? Yes/no:  Frequency? HS/PRN/Continuous:   Oxygen Therapy Modality? Nasal Cannula/NRB/Trach Collar/etc.: Optiflow  FiO2: 80  LPM: 45   Treatments Drug: Dose: Frequency: Other:  duoneb 0.5-2.5mg /69ml TID        pulmicort  0.25mg  Daily          RT Progress Date Time Important Event or Update RT Initials  09/06/2023 1841 New admit arrive on 15L HFNC + 100% NRB. Pt placed on Optiflow 100%/35L. NP  09/07/23 0512 100% Heated high flow [place] at 35L and a NRB mask SAP  09/07/2023 1558 Titrated O2 to 89%. Titrate to keep sats > 86% DS  09/07/2023 1826 40L, 85% HHFNC with NRB within reach. HL  09/08/2023 0549 40L, 85% HHFNC, NRB IN BED KN  09/08/2023 1628 HHFNC 40lpm, 80% fio2, w/NRB accessible CMG  09/09/2023 0358 HHFNC 80-85% 40L, NRB KN  09/10/2023 0608 HHFNC 80-85%, NRB KN  09/10/2023 0802 HHF 45L/90% + NRB as needed BP  09/11/2023 0145 HHF 45L/90% + NRB as needed DS  09/11/2023 0550 HHF 45L/90%+NRB as needed BP  09/12/2023 0051 HHF 45L/90% + NRB as needed DS  09/12/2023 0549 HHF 45L / 80%+NRB as needed BP  09/12/2023 2234 HHF 45L / 80%+NRB as needed DS  09/13/2023 1835 HHF 45l/80% FIO2 +NRB CMG  09/14/23 0452 Heated high flow [place], 80%/45L SAP  09/14/2023 1638 HHFNC 45lpm, 80% fio2 +NRB as needed CMG  09/15/23 0455 Heated high flow [place], 80%/40L, NRB not needed SAP  09/15/2023 1815 HHFNC 80% & 40 LPM, SOME NRB SUPPLEMENTATION JM  09/15/2023 0037 HHF 40l/80%  FIO2 +NRB DS

## 2023-09-17 DIAGNOSIS — I5043 Acute on chronic combined systolic (congestive) and diastolic (congestive) heart failure: Secondary | ICD-10-CM | POA: Diagnosis not present

## 2023-09-17 DIAGNOSIS — J181 Lobar pneumonia, unspecified organism: Secondary | ICD-10-CM | POA: Diagnosis not present

## 2023-09-17 DIAGNOSIS — I480 Paroxysmal atrial fibrillation: Secondary | ICD-10-CM | POA: Diagnosis not present

## 2023-09-17 DIAGNOSIS — J9601 Acute respiratory failure with hypoxia: Secondary | ICD-10-CM | POA: Diagnosis not present

## 2023-09-17 DIAGNOSIS — J9621 Acute and chronic respiratory failure with hypoxia: Secondary | ICD-10-CM | POA: Diagnosis not present

## 2023-09-17 LAB — APTT
aPTT: 27 s (ref 24–36)
aPTT: 26 s (ref 24–36)
aPTT: >200 s (ref 24–36)
aPTT: 149 s — ABNORMAL HIGH (ref 24–36)

## 2023-09-17 LAB — TROPONIN I (HIGH SENSITIVITY)
Troponin I (High Sensitivity): 146 ng/L (ref <18)
Troponin I (High Sensitivity): 122 ng/L (ref <18)

## 2023-09-17 LAB — HEPARIN LEVEL (UNFRACTIONATED): Heparin Unfractionated: <0.1 [IU]/mL — ABNORMAL LOW (ref 0.30–0.70)

## 2023-09-18 DIAGNOSIS — I480 Paroxysmal atrial fibrillation: Secondary | ICD-10-CM | POA: Diagnosis not present

## 2023-09-18 DIAGNOSIS — J181 Lobar pneumonia, unspecified organism: Secondary | ICD-10-CM | POA: Diagnosis not present

## 2023-09-18 DIAGNOSIS — I5043 Acute on chronic combined systolic (congestive) and diastolic (congestive) heart failure: Secondary | ICD-10-CM | POA: Diagnosis not present

## 2023-09-18 DIAGNOSIS — J9601 Acute respiratory failure with hypoxia: Secondary | ICD-10-CM | POA: Diagnosis not present

## 2023-09-18 DIAGNOSIS — J9621 Acute and chronic respiratory failure with hypoxia: Secondary | ICD-10-CM | POA: Diagnosis not present

## 2023-09-18 DIAGNOSIS — M810 Age-related osteoporosis without current pathological fracture: Secondary | ICD-10-CM | POA: Insufficient documentation

## 2023-09-18 LAB — CBC WITH DIFFERENTIAL/PLATELET
Abs Immature Granulocytes: 0.72 K/uL — ABNORMAL HIGH (ref 0.00–0.07)
Basophils Absolute: 0.1 K/uL (ref 0.0–0.1)
Basophils Relative: 0 %
Eosinophils Absolute: 0.3 K/uL (ref 0.0–0.5)
Eosinophils Relative: 1 %
HCT: 36.1 % (ref 36.0–46.0)
Hemoglobin: 11.8 g/dL — ABNORMAL LOW (ref 12.0–15.0)
Immature Granulocytes: 4 %
Lymphocytes Relative: 10 %
Lymphs Abs: 2 K/uL (ref 0.7–4.0)
MCH: 27.5 pg (ref 26.0–34.0)
MCHC: 32.7 g/dL (ref 30.0–36.0)
MCV: 84.1 fL (ref 80.0–100.0)
Monocytes Absolute: 1.6 K/uL — ABNORMAL HIGH (ref 0.1–1.0)
Monocytes Relative: 8 %
Neutro Abs: 15.5 K/uL — ABNORMAL HIGH (ref 1.7–7.7)
Neutrophils Relative %: 77 %
Platelets: 351 K/uL (ref 150–400)
RBC: 4.29 MIL/uL (ref 3.87–5.11)
RDW: 14.8 % (ref 11.5–15.5)
WBC: 20.2 K/uL — ABNORMAL HIGH (ref 4.0–10.5)
nRBC: 0 % (ref 0.0–0.2)

## 2023-09-18 LAB — COMPREHENSIVE METABOLIC PANEL WITH GFR
ALT: 27 U/L (ref 0–44)
AST: 17 U/L (ref 15–41)
Albumin: 2.3 g/dL — ABNORMAL LOW (ref 3.5–5.0)
Alkaline Phosphatase: 57 U/L (ref 38–126)
Anion gap: 9 (ref 5–15)
BUN: 48 mg/dL — ABNORMAL HIGH (ref 8–23)
CO2: 25 mmol/L (ref 22–32)
Calcium: 9 mg/dL (ref 8.9–10.3)
Chloride: 101 mmol/L (ref 98–111)
Creatinine, Ser: 0.79 mg/dL (ref 0.44–1.00)
GFR, Estimated: >60 mL/min (ref >60)
Glucose, Bld: 104 mg/dL — ABNORMAL HIGH (ref 70–99)
Potassium: 3.9 mmol/L (ref 3.5–5.1)
Sodium: 135 mmol/L (ref 135–145)
Total Bilirubin: 1.3 mg/dL — ABNORMAL HIGH (ref 0.0–1.2)
Total Protein: 5.1 g/dL — ABNORMAL LOW (ref 6.5–8.1)

## 2023-09-18 LAB — APTT: aPTT: 173 s (ref 24–36)

## 2023-09-19 DIAGNOSIS — J181 Lobar pneumonia, unspecified organism: Secondary | ICD-10-CM | POA: Diagnosis not present

## 2023-09-19 DIAGNOSIS — J9621 Acute and chronic respiratory failure with hypoxia: Secondary | ICD-10-CM | POA: Diagnosis not present

## 2023-09-19 DIAGNOSIS — I480 Paroxysmal atrial fibrillation: Secondary | ICD-10-CM | POA: Diagnosis not present

## 2023-09-19 DIAGNOSIS — I5043 Acute on chronic combined systolic (congestive) and diastolic (congestive) heart failure: Secondary | ICD-10-CM | POA: Diagnosis not present

## 2023-09-19 DIAGNOSIS — J9601 Acute respiratory failure with hypoxia: Secondary | ICD-10-CM | POA: Diagnosis not present

## 2023-09-20 DIAGNOSIS — J9621 Acute and chronic respiratory failure with hypoxia: Secondary | ICD-10-CM | POA: Diagnosis not present

## 2023-09-20 DIAGNOSIS — I480 Paroxysmal atrial fibrillation: Secondary | ICD-10-CM | POA: Diagnosis not present

## 2023-09-20 DIAGNOSIS — J181 Lobar pneumonia, unspecified organism: Secondary | ICD-10-CM | POA: Diagnosis not present

## 2023-09-20 DIAGNOSIS — I5043 Acute on chronic combined systolic (congestive) and diastolic (congestive) heart failure: Secondary | ICD-10-CM | POA: Diagnosis not present

## 2023-09-20 DIAGNOSIS — J9601 Acute respiratory failure with hypoxia: Secondary | ICD-10-CM | POA: Diagnosis not present

## 2023-09-20 LAB — CBC
HCT: 33 % — ABNORMAL LOW (ref 36.0–46.0)
Hemoglobin: 11 g/dL — ABNORMAL LOW (ref 12.0–15.0)
MCH: 28.1 pg (ref 26.0–34.0)
MCHC: 33.3 g/dL (ref 30.0–36.0)
MCV: 84.4 fL (ref 80.0–100.0)
Platelets: 298 K/uL (ref 150–400)
RBC: 3.91 MIL/uL (ref 3.87–5.11)
RDW: 14.9 % (ref 11.5–15.5)
WBC: 19.9 K/uL — ABNORMAL HIGH (ref 4.0–10.5)
nRBC: 0 % (ref 0.0–0.2)

## 2023-09-21 ENCOUNTER — Institutional Professional Consult (permissible substitution) (HOSPITAL_COMMUNITY): Payer: Self-pay

## 2023-09-21 DIAGNOSIS — J181 Lobar pneumonia, unspecified organism: Secondary | ICD-10-CM

## 2023-09-21 DIAGNOSIS — I480 Paroxysmal atrial fibrillation: Secondary | ICD-10-CM

## 2023-09-21 DIAGNOSIS — J90 Pleural effusion, not elsewhere classified: Secondary | ICD-10-CM | POA: Diagnosis not present

## 2023-09-21 DIAGNOSIS — I5043 Acute on chronic combined systolic (congestive) and diastolic (congestive) heart failure: Secondary | ICD-10-CM

## 2023-09-21 DIAGNOSIS — J9621 Acute and chronic respiratory failure with hypoxia: Secondary | ICD-10-CM

## 2023-09-21 DIAGNOSIS — J9601 Acute respiratory failure with hypoxia: Secondary | ICD-10-CM | POA: Diagnosis not present

## 2023-09-21 DIAGNOSIS — R918 Other nonspecific abnormal finding of lung field: Secondary | ICD-10-CM | POA: Diagnosis not present

## 2023-09-21 DIAGNOSIS — I700 Atherosclerosis of aorta: Secondary | ICD-10-CM | POA: Diagnosis not present

## 2023-09-21 LAB — BASIC METABOLIC PANEL WITH GFR
Anion gap: 11 (ref 5–15)
BUN: 48 mg/dL — ABNORMAL HIGH (ref 8–23)
CO2: 24 mmol/L (ref 22–32)
Calcium: 8.7 mg/dL — ABNORMAL LOW (ref 8.9–10.3)
Chloride: 102 mmol/L (ref 98–111)
Creatinine, Ser: 0.76 mg/dL (ref 0.44–1.00)
GFR, Estimated: >60 mL/min (ref >60)
Glucose, Bld: 112 mg/dL — ABNORMAL HIGH (ref 70–99)
Potassium: 4.4 mmol/L (ref 3.5–5.1)
Sodium: 137 mmol/L (ref 135–145)

## 2023-09-21 LAB — CBC WITH DIFFERENTIAL/PLATELET
Abs Immature Granulocytes: 0.3 K/uL — ABNORMAL HIGH (ref 0.00–0.07)
Basophils Absolute: 0 K/uL (ref 0.0–0.1)
Basophils Relative: 0 %
Eosinophils Absolute: 0 K/uL (ref 0.0–0.5)
Eosinophils Relative: 0 %
HCT: 34.7 % — ABNORMAL LOW (ref 36.0–46.0)
Hemoglobin: 11.3 g/dL — ABNORMAL LOW (ref 12.0–15.0)
Immature Granulocytes: 2 %
Lymphocytes Relative: 7 %
Lymphs Abs: 1.3 K/uL (ref 0.7–4.0)
MCH: 27.3 pg (ref 26.0–34.0)
MCHC: 32.6 g/dL (ref 30.0–36.0)
MCV: 83.8 fL (ref 80.0–100.0)
Monocytes Absolute: 1 K/uL (ref 0.1–1.0)
Monocytes Relative: 6 %
Neutro Abs: 14.8 K/uL — ABNORMAL HIGH (ref 1.7–7.7)
Neutrophils Relative %: 85 %
Platelets: 275 K/uL (ref 150–400)
RBC: 4.14 MIL/uL (ref 3.87–5.11)
RDW: 14.9 % (ref 11.5–15.5)
WBC: 17.4 K/uL — ABNORMAL HIGH (ref 4.0–10.5)
nRBC: 0 % (ref 0.0–0.2)

## 2023-09-22 DIAGNOSIS — I5043 Acute on chronic combined systolic (congestive) and diastolic (congestive) heart failure: Secondary | ICD-10-CM

## 2023-09-22 DIAGNOSIS — J9601 Acute respiratory failure with hypoxia: Secondary | ICD-10-CM | POA: Diagnosis not present

## 2023-09-22 DIAGNOSIS — J9621 Acute and chronic respiratory failure with hypoxia: Secondary | ICD-10-CM

## 2023-09-22 DIAGNOSIS — J181 Lobar pneumonia, unspecified organism: Secondary | ICD-10-CM

## 2023-09-22 DIAGNOSIS — I480 Paroxysmal atrial fibrillation: Secondary | ICD-10-CM

## 2023-09-23 DIAGNOSIS — J9601 Acute respiratory failure with hypoxia: Secondary | ICD-10-CM | POA: Diagnosis not present

## 2023-09-24 ENCOUNTER — Inpatient Hospital Stay (HOSPITAL_COMMUNITY)
Admission: RE | Admit: 2023-09-24 | Discharge: 2023-09-24 | Disposition: A | Discharge status: Home / Self Care | Source: Ambulatory Visit | Attending: Internal Medicine | Admitting: Internal Medicine

## 2023-09-24 DIAGNOSIS — M810 Age-related osteoporosis without current pathological fracture: Secondary | ICD-10-CM

## 2023-09-26 LAB — CBC WITH DIFFERENTIAL/PLATELET
Abs Immature Granulocytes: 0.1 K/uL — ABNORMAL HIGH (ref 0.00–0.07)
Basophils Absolute: 0 K/uL (ref 0.0–0.1)
Basophils Relative: 0 %
Eosinophils Absolute: 0.3 K/uL (ref 0.0–0.5)
Eosinophils Relative: 2 %
HCT: 32.2 % — ABNORMAL LOW (ref 36.0–46.0)
Hemoglobin: 10.8 g/dL — ABNORMAL LOW (ref 12.0–15.0)
Immature Granulocytes: 1 %
Lymphocytes Relative: 8 %
Lymphs Abs: 1.3 K/uL (ref 0.7–4.0)
MCH: 28.1 pg (ref 26.0–34.0)
MCHC: 33.5 g/dL (ref 30.0–36.0)
MCV: 83.9 fL (ref 80.0–100.0)
Monocytes Absolute: 1 K/uL (ref 0.1–1.0)
Monocytes Relative: 6 %
Neutro Abs: 12.9 K/uL — ABNORMAL HIGH (ref 1.7–7.7)
Neutrophils Relative %: 83 %
Platelets: 189 K/uL (ref 150–400)
RBC: 3.84 MIL/uL — ABNORMAL LOW (ref 3.87–5.11)
RDW: 16 % — ABNORMAL HIGH (ref 11.5–15.5)
WBC: 15.6 K/uL — ABNORMAL HIGH (ref 4.0–10.5)
nRBC: 0 % (ref 0.0–0.2)

## 2023-09-26 LAB — BASIC METABOLIC PANEL WITH GFR
Anion gap: 14 (ref 5–15)
BUN: 40 mg/dL — ABNORMAL HIGH (ref 8–23)
CO2: 22 mmol/L (ref 22–32)
Calcium: 8.9 mg/dL (ref 8.9–10.3)
Chloride: 99 mmol/L (ref 98–111)
Creatinine, Ser: 0.94 mg/dL (ref 0.44–1.00)
GFR, Estimated: 57 mL/min — ABNORMAL LOW (ref >60)
Glucose, Bld: 117 mg/dL — ABNORMAL HIGH (ref 70–99)
Potassium: 3.9 mmol/L (ref 3.5–5.1)
Sodium: 135 mmol/L (ref 135–145)

## 2023-09-30 LAB — CBC WITH DIFFERENTIAL/PLATELET
Abs Immature Granulocytes: 0.07 K/uL (ref 0.00–0.07)
Basophils Absolute: 0 K/uL (ref 0.0–0.1)
Basophils Relative: 0 %
Eosinophils Absolute: 0 K/uL (ref 0.0–0.5)
Eosinophils Relative: 0 %
HCT: 35.2 % — ABNORMAL LOW (ref 36.0–46.0)
Hemoglobin: 11.7 g/dL — ABNORMAL LOW (ref 12.0–15.0)
Immature Granulocytes: 1 %
Lymphocytes Relative: 6 %
Lymphs Abs: 0.9 K/uL (ref 0.7–4.0)
MCH: 28.1 pg (ref 26.0–34.0)
MCHC: 33.2 g/dL (ref 30.0–36.0)
MCV: 84.4 fL (ref 80.0–100.0)
Monocytes Absolute: 0.9 K/uL (ref 0.1–1.0)
Monocytes Relative: 6 %
Neutro Abs: 13.6 K/uL — ABNORMAL HIGH (ref 1.7–7.7)
Neutrophils Relative %: 87 %
Platelets: 240 K/uL (ref 150–400)
RBC: 4.17 MIL/uL (ref 3.87–5.11)
RDW: 16.6 % — ABNORMAL HIGH (ref 11.5–15.5)
WBC: 15.5 K/uL — ABNORMAL HIGH (ref 4.0–10.5)
nRBC: 0 % (ref 0.0–0.2)

## 2023-09-30 LAB — BASIC METABOLIC PANEL WITH GFR
Anion gap: 11 (ref 5–15)
BUN: 46 mg/dL — ABNORMAL HIGH (ref 8–23)
CO2: 24 mmol/L (ref 22–32)
Calcium: 9.1 mg/dL (ref 8.9–10.3)
Chloride: 103 mmol/L (ref 98–111)
Creatinine, Ser: 1.01 mg/dL — ABNORMAL HIGH (ref 0.44–1.00)
GFR, Estimated: 52 mL/min — ABNORMAL LOW (ref >60)
Glucose, Bld: 147 mg/dL — ABNORMAL HIGH (ref 70–99)
Potassium: 4.6 mmol/L (ref 3.5–5.1)
Sodium: 138 mmol/L (ref 135–145)

## 2023-10-01 LAB — CBC
HCT: 30.5 % — ABNORMAL LOW (ref 36.0–46.0)
Hemoglobin: 10.1 g/dL — ABNORMAL LOW (ref 12.0–15.0)
MCH: 28.4 pg (ref 26.0–34.0)
MCHC: 33.1 g/dL (ref 30.0–36.0)
MCV: 85.7 fL (ref 80.0–100.0)
Platelets: 219 K/uL (ref 150–400)
RBC: 3.56 MIL/uL — ABNORMAL LOW (ref 3.87–5.11)
RDW: 16.2 % — ABNORMAL HIGH (ref 11.5–15.5)
WBC: 11.9 K/uL — ABNORMAL HIGH (ref 4.0–10.5)
nRBC: 0 % (ref 0.0–0.2)

## 2023-10-01 LAB — BASIC METABOLIC PANEL WITH GFR
Anion gap: 10 (ref 5–15)
BUN: 53 mg/dL — ABNORMAL HIGH (ref 8–23)
CO2: 22 mmol/L (ref 22–32)
Calcium: 8.5 mg/dL — ABNORMAL LOW (ref 8.9–10.3)
Chloride: 102 mmol/L (ref 98–111)
Creatinine, Ser: 1.11 mg/dL — ABNORMAL HIGH (ref 0.44–1.00)
GFR, Estimated: 46 mL/min — ABNORMAL LOW (ref >60)
Glucose, Bld: 111 mg/dL — ABNORMAL HIGH (ref 70–99)
Potassium: 4.4 mmol/L (ref 3.5–5.1)
Sodium: 134 mmol/L — ABNORMAL LOW (ref 135–145)

## 2023-10-03 DIAGNOSIS — J9621 Acute and chronic respiratory failure with hypoxia: Secondary | ICD-10-CM

## 2023-10-03 DIAGNOSIS — I480 Paroxysmal atrial fibrillation: Secondary | ICD-10-CM

## 2023-10-03 DIAGNOSIS — I5043 Acute on chronic combined systolic (congestive) and diastolic (congestive) heart failure: Secondary | ICD-10-CM

## 2023-10-03 DIAGNOSIS — J181 Lobar pneumonia, unspecified organism: Secondary | ICD-10-CM

## 2023-10-04 DIAGNOSIS — J181 Lobar pneumonia, unspecified organism: Secondary | ICD-10-CM

## 2023-10-04 DIAGNOSIS — I480 Paroxysmal atrial fibrillation: Secondary | ICD-10-CM

## 2023-10-04 DIAGNOSIS — I5043 Acute on chronic combined systolic (congestive) and diastolic (congestive) heart failure: Secondary | ICD-10-CM

## 2023-10-04 DIAGNOSIS — J9621 Acute and chronic respiratory failure with hypoxia: Secondary | ICD-10-CM

## 2023-10-05 DIAGNOSIS — I5043 Acute on chronic combined systolic (congestive) and diastolic (congestive) heart failure: Secondary | ICD-10-CM

## 2023-10-05 DIAGNOSIS — J181 Lobar pneumonia, unspecified organism: Secondary | ICD-10-CM

## 2023-10-05 DIAGNOSIS — I480 Paroxysmal atrial fibrillation: Secondary | ICD-10-CM

## 2023-10-05 DIAGNOSIS — J9621 Acute and chronic respiratory failure with hypoxia: Secondary | ICD-10-CM

## 2023-10-06 DIAGNOSIS — J181 Lobar pneumonia, unspecified organism: Secondary | ICD-10-CM

## 2023-10-06 DIAGNOSIS — I5043 Acute on chronic combined systolic (congestive) and diastolic (congestive) heart failure: Secondary | ICD-10-CM

## 2023-10-06 DIAGNOSIS — J9621 Acute and chronic respiratory failure with hypoxia: Secondary | ICD-10-CM

## 2023-10-06 DIAGNOSIS — I480 Paroxysmal atrial fibrillation: Secondary | ICD-10-CM

## 2023-10-07 ENCOUNTER — Institutional Professional Consult (permissible substitution) (HOSPITAL_COMMUNITY): Payer: Self-pay

## 2023-10-07 DIAGNOSIS — J181 Lobar pneumonia, unspecified organism: Secondary | ICD-10-CM

## 2023-10-07 DIAGNOSIS — J9621 Acute and chronic respiratory failure with hypoxia: Secondary | ICD-10-CM

## 2023-10-07 DIAGNOSIS — I480 Paroxysmal atrial fibrillation: Secondary | ICD-10-CM

## 2023-10-07 DIAGNOSIS — I5043 Acute on chronic combined systolic (congestive) and diastolic (congestive) heart failure: Secondary | ICD-10-CM

## 2023-10-07 LAB — CBC WITH DIFFERENTIAL/PLATELET
Abs Immature Granulocytes: 0.25 K/uL — ABNORMAL HIGH (ref 0.00–0.07)
Basophils Absolute: 0 K/uL (ref 0.0–0.1)
Basophils Relative: 0 %
Eosinophils Absolute: 0.2 K/uL (ref 0.0–0.5)
Eosinophils Relative: 2 %
HCT: 31.8 % — ABNORMAL LOW (ref 36.0–46.0)
Hemoglobin: 10.6 g/dL — ABNORMAL LOW (ref 12.0–15.0)
Immature Granulocytes: 3 %
Lymphocytes Relative: 15 %
Lymphs Abs: 1.3 K/uL (ref 0.7–4.0)
MCH: 28.2 pg (ref 26.0–34.0)
MCHC: 33.3 g/dL (ref 30.0–36.0)
MCV: 84.6 fL (ref 80.0–100.0)
Monocytes Absolute: 1 K/uL (ref 0.1–1.0)
Monocytes Relative: 12 %
Neutro Abs: 5.9 K/uL (ref 1.7–7.7)
Neutrophils Relative %: 68 %
Platelets: 370 K/uL (ref 150–400)
RBC: 3.76 MIL/uL — ABNORMAL LOW (ref 3.87–5.11)
RDW: 16.6 % — ABNORMAL HIGH (ref 11.5–15.5)
WBC: 8.7 K/uL (ref 4.0–10.5)
nRBC: 0 % (ref 0.0–0.2)

## 2023-10-07 LAB — BASIC METABOLIC PANEL WITH GFR
Anion gap: 11 (ref 5–15)
BUN: 35 mg/dL — ABNORMAL HIGH (ref 8–23)
CO2: 24 mmol/L (ref 22–32)
Calcium: 8.4 mg/dL — ABNORMAL LOW (ref 8.9–10.3)
Chloride: 101 mmol/L (ref 98–111)
Creatinine, Ser: 1.09 mg/dL — ABNORMAL HIGH (ref 0.44–1.00)
GFR, Estimated: 47 mL/min — ABNORMAL LOW (ref >60)
Glucose, Bld: 103 mg/dL — ABNORMAL HIGH (ref 70–99)
Potassium: 3.8 mmol/L (ref 3.5–5.1)
Sodium: 136 mmol/L (ref 135–145)

## 2023-10-08 DIAGNOSIS — N183 Chronic kidney disease, stage 3 unspecified: Secondary | ICD-10-CM | POA: Diagnosis not present

## 2023-10-08 DIAGNOSIS — R638 Other symptoms and signs concerning food and fluid intake: Secondary | ICD-10-CM | POA: Diagnosis not present

## 2023-10-08 DIAGNOSIS — I5040 Unspecified combined systolic (congestive) and diastolic (congestive) heart failure: Secondary | ICD-10-CM | POA: Diagnosis not present

## 2023-10-08 DIAGNOSIS — R531 Weakness: Secondary | ICD-10-CM | POA: Diagnosis not present

## 2023-10-08 DIAGNOSIS — A419 Sepsis, unspecified organism: Secondary | ICD-10-CM | POA: Diagnosis not present

## 2023-10-08 DIAGNOSIS — K219 Gastro-esophageal reflux disease without esophagitis: Secondary | ICD-10-CM | POA: Diagnosis not present

## 2023-10-08 DIAGNOSIS — J9601 Acute respiratory failure with hypoxia: Secondary | ICD-10-CM | POA: Diagnosis not present

## 2023-10-08 DIAGNOSIS — J9621 Acute and chronic respiratory failure with hypoxia: Secondary | ICD-10-CM | POA: Diagnosis not present

## 2023-10-08 DIAGNOSIS — R2681 Unsteadiness on feet: Secondary | ICD-10-CM | POA: Diagnosis not present

## 2023-10-08 DIAGNOSIS — J181 Lobar pneumonia, unspecified organism: Secondary | ICD-10-CM | POA: Diagnosis not present

## 2023-10-08 DIAGNOSIS — Z7401 Bed confinement status: Secondary | ICD-10-CM | POA: Diagnosis not present

## 2023-10-08 DIAGNOSIS — I10 Essential (primary) hypertension: Secondary | ICD-10-CM | POA: Diagnosis not present

## 2023-10-08 DIAGNOSIS — E039 Hypothyroidism, unspecified: Secondary | ICD-10-CM | POA: Diagnosis not present

## 2023-10-08 DIAGNOSIS — R0902 Hypoxemia: Secondary | ICD-10-CM | POA: Diagnosis not present

## 2023-10-08 DIAGNOSIS — I5043 Acute on chronic combined systolic (congestive) and diastolic (congestive) heart failure: Secondary | ICD-10-CM | POA: Diagnosis not present

## 2023-10-08 DIAGNOSIS — R413 Other amnesia: Secondary | ICD-10-CM | POA: Diagnosis not present

## 2023-10-08 DIAGNOSIS — M6281 Muscle weakness (generalized): Secondary | ICD-10-CM | POA: Diagnosis not present

## 2023-10-08 DIAGNOSIS — I480 Paroxysmal atrial fibrillation: Secondary | ICD-10-CM | POA: Diagnosis not present

## 2023-10-08 DIAGNOSIS — D72829 Elevated white blood cell count, unspecified: Secondary | ICD-10-CM | POA: Diagnosis not present

## 2023-10-08 DIAGNOSIS — R278 Other lack of coordination: Secondary | ICD-10-CM | POA: Diagnosis not present

## 2023-10-08 DIAGNOSIS — J189 Pneumonia, unspecified organism: Secondary | ICD-10-CM | POA: Diagnosis not present

## 2023-10-08 DIAGNOSIS — J9691 Respiratory failure, unspecified with hypoxia: Secondary | ICD-10-CM | POA: Diagnosis not present

## 2023-10-08 DIAGNOSIS — D75839 Thrombocytosis, unspecified: Secondary | ICD-10-CM | POA: Diagnosis not present

## 2023-10-08 LAB — CBC
HCT: 31.1 % — ABNORMAL LOW (ref 36.0–46.0)
Hemoglobin: 10.4 g/dL — ABNORMAL LOW (ref 12.0–15.0)
MCH: 28 pg (ref 26.0–34.0)
MCHC: 33.4 g/dL (ref 30.0–36.0)
MCV: 83.6 fL (ref 80.0–100.0)
Platelets: 373 K/uL (ref 150–400)
RBC: 3.72 MIL/uL — ABNORMAL LOW (ref 3.87–5.11)
RDW: 16.4 % — ABNORMAL HIGH (ref 11.5–15.5)
WBC: 9.7 K/uL (ref 4.0–10.5)
nRBC: 0 % (ref 0.0–0.2)

## 2023-10-08 LAB — BASIC METABOLIC PANEL WITH GFR
Anion gap: 10 (ref 5–15)
BUN: 38 mg/dL — ABNORMAL HIGH (ref 8–23)
CO2: 24 mmol/L (ref 22–32)
Calcium: 8.1 mg/dL — ABNORMAL LOW (ref 8.9–10.3)
Chloride: 97 mmol/L — ABNORMAL LOW (ref 98–111)
Creatinine, Ser: 1.15 mg/dL — ABNORMAL HIGH (ref 0.44–1.00)
GFR, Estimated: 44 mL/min — ABNORMAL LOW (ref >60)
Glucose, Bld: 109 mg/dL — ABNORMAL HIGH (ref 70–99)
Potassium: 4 mmol/L (ref 3.5–5.1)
Sodium: 131 mmol/L — ABNORMAL LOW (ref 135–145)

## 2023-10-11 DIAGNOSIS — I509 Heart failure, unspecified: Secondary | ICD-10-CM | POA: Diagnosis not present

## 2023-10-11 DIAGNOSIS — J9691 Respiratory failure, unspecified with hypoxia: Secondary | ICD-10-CM | POA: Diagnosis not present

## 2023-10-11 DIAGNOSIS — I10 Essential (primary) hypertension: Secondary | ICD-10-CM | POA: Diagnosis not present

## 2023-10-11 DIAGNOSIS — N183 Chronic kidney disease, stage 3 unspecified: Secondary | ICD-10-CM | POA: Diagnosis not present

## 2023-10-11 DIAGNOSIS — D72829 Elevated white blood cell count, unspecified: Secondary | ICD-10-CM | POA: Diagnosis not present

## 2023-10-11 DIAGNOSIS — I480 Paroxysmal atrial fibrillation: Secondary | ICD-10-CM | POA: Diagnosis not present

## 2023-10-11 DIAGNOSIS — E039 Hypothyroidism, unspecified: Secondary | ICD-10-CM | POA: Diagnosis not present

## 2023-10-18 ENCOUNTER — Inpatient Hospital Stay (HOSPITAL_COMMUNITY)
Admission: EM | Admit: 2023-10-18 | Discharge: 2023-11-10 | DRG: 196 | Disposition: E | Source: Skilled Nursing Facility | Attending: Internal Medicine | Admitting: Internal Medicine

## 2023-10-18 ENCOUNTER — Emergency Department (HOSPITAL_COMMUNITY)

## 2023-10-18 ENCOUNTER — Encounter (HOSPITAL_COMMUNITY): Payer: Self-pay | Admitting: Family Medicine

## 2023-10-18 ENCOUNTER — Other Ambulatory Visit: Payer: Self-pay

## 2023-10-18 DIAGNOSIS — E039 Hypothyroidism, unspecified: Secondary | ICD-10-CM | POA: Diagnosis present

## 2023-10-18 DIAGNOSIS — Z7952 Long term (current) use of systemic steroids: Secondary | ICD-10-CM | POA: Diagnosis not present

## 2023-10-18 DIAGNOSIS — Z806 Family history of leukemia: Secondary | ICD-10-CM

## 2023-10-18 DIAGNOSIS — Z66 Do not resuscitate: Secondary | ICD-10-CM | POA: Diagnosis present

## 2023-10-18 DIAGNOSIS — I480 Paroxysmal atrial fibrillation: Secondary | ICD-10-CM | POA: Diagnosis present

## 2023-10-18 DIAGNOSIS — J9691 Respiratory failure, unspecified with hypoxia: Secondary | ICD-10-CM | POA: Diagnosis not present

## 2023-10-18 DIAGNOSIS — F0394 Unspecified dementia, unspecified severity, with anxiety: Secondary | ICD-10-CM | POA: Diagnosis present

## 2023-10-18 DIAGNOSIS — Z79890 Hormone replacement therapy (postmenopausal): Secondary | ICD-10-CM | POA: Diagnosis not present

## 2023-10-18 DIAGNOSIS — J811 Chronic pulmonary edema: Secondary | ICD-10-CM | POA: Diagnosis not present

## 2023-10-18 DIAGNOSIS — I5033 Acute on chronic diastolic (congestive) heart failure: Secondary | ICD-10-CM | POA: Diagnosis present

## 2023-10-18 DIAGNOSIS — Z1152 Encounter for screening for COVID-19: Secondary | ICD-10-CM

## 2023-10-18 DIAGNOSIS — I5032 Chronic diastolic (congestive) heart failure: Secondary | ICD-10-CM | POA: Diagnosis present

## 2023-10-18 DIAGNOSIS — R0682 Tachypnea, not elsewhere classified: Secondary | ICD-10-CM | POA: Diagnosis not present

## 2023-10-18 DIAGNOSIS — J188 Other pneumonia, unspecified organism: Secondary | ICD-10-CM | POA: Diagnosis not present

## 2023-10-18 DIAGNOSIS — J8410 Pulmonary fibrosis, unspecified: Secondary | ICD-10-CM | POA: Diagnosis present

## 2023-10-18 DIAGNOSIS — Z7901 Long term (current) use of anticoagulants: Secondary | ICD-10-CM | POA: Diagnosis not present

## 2023-10-18 DIAGNOSIS — J189 Pneumonia, unspecified organism: Secondary | ICD-10-CM | POA: Diagnosis present

## 2023-10-18 DIAGNOSIS — I130 Hypertensive heart and chronic kidney disease with heart failure and stage 1 through stage 4 chronic kidney disease, or unspecified chronic kidney disease: Secondary | ICD-10-CM | POA: Diagnosis present

## 2023-10-18 DIAGNOSIS — J949 Pleural condition, unspecified: Secondary | ICD-10-CM | POA: Diagnosis not present

## 2023-10-18 DIAGNOSIS — I4891 Unspecified atrial fibrillation: Secondary | ICD-10-CM | POA: Diagnosis not present

## 2023-10-18 DIAGNOSIS — Z820 Family history of epilepsy and other diseases of the nervous system: Secondary | ICD-10-CM

## 2023-10-18 DIAGNOSIS — R918 Other nonspecific abnormal finding of lung field: Secondary | ICD-10-CM | POA: Diagnosis not present

## 2023-10-18 DIAGNOSIS — N1831 Chronic kidney disease, stage 3a: Secondary | ICD-10-CM | POA: Diagnosis present

## 2023-10-18 DIAGNOSIS — R0602 Shortness of breath: Secondary | ICD-10-CM | POA: Diagnosis not present

## 2023-10-18 DIAGNOSIS — Z811 Family history of alcohol abuse and dependence: Secondary | ICD-10-CM

## 2023-10-18 DIAGNOSIS — I081 Rheumatic disorders of both mitral and tricuspid valves: Secondary | ICD-10-CM | POA: Diagnosis present

## 2023-10-18 DIAGNOSIS — Z8616 Personal history of COVID-19: Secondary | ICD-10-CM

## 2023-10-18 DIAGNOSIS — J9621 Acute and chronic respiratory failure with hypoxia: Principal | ICD-10-CM | POA: Diagnosis present

## 2023-10-18 DIAGNOSIS — E785 Hyperlipidemia, unspecified: Secondary | ICD-10-CM | POA: Diagnosis present

## 2023-10-18 DIAGNOSIS — Z79899 Other long term (current) drug therapy: Secondary | ICD-10-CM

## 2023-10-18 DIAGNOSIS — Z7982 Long term (current) use of aspirin: Secondary | ICD-10-CM

## 2023-10-18 DIAGNOSIS — J470 Bronchiectasis with acute lower respiratory infection: Secondary | ICD-10-CM | POA: Diagnosis present

## 2023-10-18 DIAGNOSIS — Z953 Presence of xenogenic heart valve: Secondary | ICD-10-CM

## 2023-10-18 DIAGNOSIS — K219 Gastro-esophageal reflux disease without esophagitis: Secondary | ICD-10-CM | POA: Diagnosis present

## 2023-10-18 DIAGNOSIS — I10 Essential (primary) hypertension: Secondary | ICD-10-CM | POA: Diagnosis not present

## 2023-10-18 DIAGNOSIS — I350 Nonrheumatic aortic (valve) stenosis: Secondary | ICD-10-CM | POA: Diagnosis present

## 2023-10-18 DIAGNOSIS — Z7189 Other specified counseling: Secondary | ICD-10-CM | POA: Diagnosis not present

## 2023-10-18 DIAGNOSIS — Z7951 Long term (current) use of inhaled steroids: Secondary | ICD-10-CM | POA: Diagnosis not present

## 2023-10-18 DIAGNOSIS — R069 Unspecified abnormalities of breathing: Secondary | ICD-10-CM | POA: Diagnosis not present

## 2023-10-18 DIAGNOSIS — Z515 Encounter for palliative care: Secondary | ICD-10-CM | POA: Diagnosis not present

## 2023-10-18 DIAGNOSIS — Z8701 Personal history of pneumonia (recurrent): Secondary | ICD-10-CM

## 2023-10-18 DIAGNOSIS — Z90710 Acquired absence of both cervix and uterus: Secondary | ICD-10-CM

## 2023-10-18 DIAGNOSIS — J9601 Acute respiratory failure with hypoxia: Secondary | ICD-10-CM | POA: Diagnosis not present

## 2023-10-18 DIAGNOSIS — N189 Chronic kidney disease, unspecified: Secondary | ICD-10-CM | POA: Diagnosis not present

## 2023-10-18 DIAGNOSIS — D649 Anemia, unspecified: Secondary | ICD-10-CM | POA: Diagnosis present

## 2023-10-18 DIAGNOSIS — Z9981 Dependence on supplemental oxygen: Secondary | ICD-10-CM

## 2023-10-18 DIAGNOSIS — I700 Atherosclerosis of aorta: Secondary | ICD-10-CM | POA: Diagnosis not present

## 2023-10-18 DIAGNOSIS — N3281 Overactive bladder: Secondary | ICD-10-CM | POA: Diagnosis present

## 2023-10-18 DIAGNOSIS — H9190 Unspecified hearing loss, unspecified ear: Secondary | ICD-10-CM | POA: Diagnosis present

## 2023-10-18 DIAGNOSIS — M8580 Other specified disorders of bone density and structure, unspecified site: Secondary | ICD-10-CM | POA: Diagnosis present

## 2023-10-18 DIAGNOSIS — Z8672 Personal history of thrombophlebitis: Secondary | ICD-10-CM

## 2023-10-18 LAB — I-STAT VENOUS BLOOD GAS, ED
Acid-Base Excess: 1 mmol/L (ref 0.0–2.0)
Bicarbonate: 26.5 mmol/L (ref 20.0–28.0)
Calcium, Ion: 1.15 mmol/L (ref 1.15–1.40)
HCT: 34 % — ABNORMAL LOW (ref 36.0–46.0)
Hemoglobin: 11.6 g/dL — ABNORMAL LOW (ref 12.0–15.0)
O2 Saturation: 99 %
Potassium: 3.7 mmol/L (ref 3.5–5.1)
Sodium: 138 mmol/L (ref 135–145)
TCO2: 28 mmol/L (ref 22–32)
pCO2, Ven: 43.7 mmHg — ABNORMAL LOW (ref 44–60)
pH, Ven: 7.392 (ref 7.25–7.43)
pO2, Ven: 160 mmHg — ABNORMAL HIGH (ref 32–45)

## 2023-10-18 LAB — CBC WITH DIFFERENTIAL/PLATELET
Abs Immature Granulocytes: 0.71 K/uL — ABNORMAL HIGH (ref 0.00–0.07)
Basophils Absolute: 0.1 K/uL (ref 0.0–0.1)
Basophils Relative: 1 %
Eosinophils Absolute: 0.2 K/uL (ref 0.0–0.5)
Eosinophils Relative: 1 %
HCT: 35.2 % — ABNORMAL LOW (ref 36.0–46.0)
Hemoglobin: 11.4 g/dL — ABNORMAL LOW (ref 12.0–15.0)
Immature Granulocytes: 4 %
Lymphocytes Relative: 14 %
Lymphs Abs: 2.6 K/uL (ref 0.7–4.0)
MCH: 28.4 pg (ref 26.0–34.0)
MCHC: 32.4 g/dL (ref 30.0–36.0)
MCV: 87.8 fL (ref 80.0–100.0)
Monocytes Absolute: 1.7 K/uL — ABNORMAL HIGH (ref 0.1–1.0)
Monocytes Relative: 9 %
Neutro Abs: 13.4 K/uL — ABNORMAL HIGH (ref 1.7–7.7)
Neutrophils Relative %: 71 %
Platelets: 276 K/uL (ref 150–400)
RBC: 4.01 MIL/uL (ref 3.87–5.11)
RDW: 17.9 % — ABNORMAL HIGH (ref 11.5–15.5)
WBC: 18.7 K/uL — ABNORMAL HIGH (ref 4.0–10.5)
nRBC: 0 % (ref 0.0–0.2)

## 2023-10-18 LAB — COMPREHENSIVE METABOLIC PANEL WITH GFR
ALT: 21 U/L (ref 0–44)
AST: 21 U/L (ref 15–41)
Albumin: 2.9 g/dL — ABNORMAL LOW (ref 3.5–5.0)
Alkaline Phosphatase: 44 U/L (ref 38–126)
Anion gap: 15 (ref 5–15)
BUN: 29 mg/dL — ABNORMAL HIGH (ref 8–23)
CO2: 23 mmol/L (ref 22–32)
Calcium: 8.5 mg/dL — ABNORMAL LOW (ref 8.9–10.3)
Chloride: 100 mmol/L (ref 98–111)
Creatinine, Ser: 1.21 mg/dL — ABNORMAL HIGH (ref 0.44–1.00)
GFR, Estimated: 42 mL/min — ABNORMAL LOW (ref >60)
Glucose, Bld: 152 mg/dL — ABNORMAL HIGH (ref 70–99)
Potassium: 3.6 mmol/L (ref 3.5–5.1)
Sodium: 138 mmol/L (ref 135–145)
Total Bilirubin: 1.3 mg/dL — ABNORMAL HIGH (ref 0.0–1.2)
Total Protein: 6 g/dL — ABNORMAL LOW (ref 6.5–8.1)

## 2023-10-18 LAB — I-STAT CHEM 8, ED
BUN: 33 mg/dL — ABNORMAL HIGH (ref 8–23)
Calcium, Ion: 1.18 mmol/L (ref 1.15–1.40)
Chloride: 102 mmol/L (ref 98–111)
Creatinine, Ser: 1.1 mg/dL — ABNORMAL HIGH (ref 0.44–1.00)
Glucose, Bld: 150 mg/dL — ABNORMAL HIGH (ref 70–99)
HCT: 34 % — ABNORMAL LOW (ref 36.0–46.0)
Hemoglobin: 11.6 g/dL — ABNORMAL LOW (ref 12.0–15.0)
Potassium: 3.7 mmol/L (ref 3.5–5.1)
Sodium: 139 mmol/L (ref 135–145)
TCO2: 27 mmol/L (ref 22–32)

## 2023-10-18 LAB — RESP PANEL BY RT-PCR (RSV, FLU A&B, COVID)  RVPGX2
Influenza A by PCR: NEGATIVE
Influenza B by PCR: NEGATIVE
Resp Syncytial Virus by PCR: NEGATIVE
SARS Coronavirus 2 by RT PCR: NEGATIVE

## 2023-10-18 LAB — PROTIME-INR
INR: 1 (ref 0.8–1.2)
Prothrombin Time: 13.5 s (ref 11.4–15.2)

## 2023-10-18 LAB — BRAIN NATRIURETIC PEPTIDE: B Natriuretic Peptide: 411.7 pg/mL — ABNORMAL HIGH (ref 0.0–100.0)

## 2023-10-18 MED ORDER — DONEPEZIL HCL 10 MG PO TABS
10.0000 mg | ORAL_TABLET | Freq: Every day | ORAL | Status: DC
  Administered 2023-10-19 – 2023-10-27 (×10): 10 mg via ORAL
  Filled 2023-10-18 (×10): qty 1

## 2023-10-18 MED ORDER — AMLODIPINE BESYLATE 10 MG PO TABS
10.0000 mg | ORAL_TABLET | Freq: Every day | ORAL | Status: DC
  Administered 2023-10-19 – 2023-10-28 (×9): 10 mg via ORAL
  Filled 2023-10-18: qty 2
  Filled 2023-10-18 (×9): qty 1

## 2023-10-18 MED ORDER — ENOXAPARIN SODIUM 40 MG/0.4ML IJ SOSY: 40.0000 mg | PREFILLED_SYRINGE | INTRAMUSCULAR | Status: DC

## 2023-10-18 MED ORDER — LINEZOLID 600 MG/300ML IV SOLN
600.0000 mg | Freq: Once | INTRAVENOUS | Status: AC
  Administered 2023-10-18: 600 mg via INTRAVENOUS
  Filled 2023-10-18: qty 300

## 2023-10-18 MED ORDER — ACETAMINOPHEN 325 MG PO TABS
650.0000 mg | ORAL_TABLET | Freq: Four times a day (QID) | ORAL | Status: DC | PRN
  Administered 2023-10-19 – 2023-10-23 (×6): 650 mg via ORAL
  Filled 2023-10-18 (×8): qty 2

## 2023-10-18 MED ORDER — APIXABAN 5 MG PO TABS
5.0000 mg | ORAL_TABLET | Freq: Two times a day (BID) | ORAL | Status: DC
Start: 2023-10-19 — End: 2023-10-28
  Administered 2023-10-19 – 2023-10-28 (×20): 5 mg via ORAL
  Filled 2023-10-18 (×20): qty 1

## 2023-10-18 MED ORDER — POTASSIUM CHLORIDE ER 10 MEQ PO TBCR
20.0000 meq | EXTENDED_RELEASE_TABLET | Freq: Once | ORAL | Status: AC
  Administered 2023-10-19: 20 meq via ORAL
  Filled 2023-10-18: qty 2

## 2023-10-18 MED ORDER — SODIUM CHLORIDE 0.9 % IV SOLN
500.0000 mg | Freq: Once | INTRAVENOUS | Status: AC
  Administered 2023-10-18: 500 mg via INTRAVENOUS
  Filled 2023-10-18: qty 5

## 2023-10-18 MED ORDER — PREDNISONE 20 MG PO TABS
20.0000 mg | ORAL_TABLET | Freq: Every day | ORAL | Status: DC
  Administered 2023-10-19 – 2023-10-21 (×3): 20 mg via ORAL
  Filled 2023-10-18 (×3): qty 1

## 2023-10-18 MED ORDER — LEVOTHYROXINE SODIUM 50 MCG PO TABS
50.0000 ug | ORAL_TABLET | Freq: Every day | ORAL | Status: DC
  Administered 2023-10-19 – 2023-10-28 (×10): 50 ug via ORAL
  Filled 2023-10-18 (×4): qty 1
  Filled 2023-10-18: qty 2
  Filled 2023-10-18 (×5): qty 1

## 2023-10-18 MED ORDER — DILTIAZEM HCL ER COATED BEADS 120 MG PO CP24
120.0000 mg | ORAL_CAPSULE | Freq: Every day | ORAL | Status: DC
  Administered 2023-10-19 – 2023-10-28 (×10): 120 mg via ORAL
  Filled 2023-10-18 (×11): qty 1

## 2023-10-18 MED ORDER — SODIUM CHLORIDE 0.9 % IV SOLN
2.0000 g | INTRAVENOUS | Status: DC
  Administered 2023-10-19 – 2023-10-20 (×2): 2 g via INTRAVENOUS
  Filled 2023-10-18 (×2): qty 12.5

## 2023-10-18 MED ORDER — FUROSEMIDE 40 MG PO TABS
40.0000 mg | ORAL_TABLET | Freq: Every day | ORAL | Status: DC
  Administered 2023-10-19 – 2023-10-27 (×9): 40 mg via ORAL
  Filled 2023-10-18 (×3): qty 1
  Filled 2023-10-18: qty 2
  Filled 2023-10-18 (×5): qty 1

## 2023-10-18 MED ORDER — ONDANSETRON HCL 4 MG/2ML IJ SOLN: 4.0000 mg | Freq: Four times a day (QID) | INTRAMUSCULAR | Status: DC | PRN

## 2023-10-18 MED ORDER — METOPROLOL TARTRATE 50 MG PO TABS
50.0000 mg | ORAL_TABLET | Freq: Two times a day (BID) | ORAL | Status: DC
Start: 2023-10-19 — End: 2023-10-28
  Administered 2023-10-19 – 2023-10-28 (×19): 50 mg via ORAL
  Filled 2023-10-18 (×4): qty 1
  Filled 2023-10-18: qty 2
  Filled 2023-10-18 (×14): qty 1
  Filled 2023-10-18: qty 2

## 2023-10-18 MED ORDER — GUAIFENESIN-DM 100-10 MG/5ML PO SYRP
5.0000 mL | ORAL_SOLUTION | ORAL | Status: DC | PRN
  Administered 2023-10-27: 5 mL via ORAL
  Filled 2023-10-18: qty 5

## 2023-10-18 MED ORDER — ACETAMINOPHEN 650 MG RE SUPP: 650.0000 mg | Freq: Four times a day (QID) | RECTAL | Status: DC | PRN

## 2023-10-18 MED ORDER — ONDANSETRON HCL 4 MG PO TABS: 4.0000 mg | ORAL_TABLET | Freq: Four times a day (QID) | ORAL | Status: DC | PRN

## 2023-10-18 MED ORDER — ASPIRIN 81 MG PO TBEC
81.0000 mg | DELAYED_RELEASE_TABLET | Freq: Every day | ORAL | Status: DC
  Administered 2023-10-19 – 2023-10-28 (×10): 81 mg via ORAL
  Filled 2023-10-18 (×10): qty 1

## 2023-10-18 MED ORDER — IRBESARTAN 300 MG PO TABS
300.0000 mg | ORAL_TABLET | Freq: Every day | ORAL | Status: DC
  Administered 2023-10-19 – 2023-10-28 (×10): 300 mg via ORAL
  Filled 2023-10-18 (×10): qty 1

## 2023-10-18 MED ORDER — SODIUM CHLORIDE 0.9% FLUSH
3.0000 mL | Freq: Two times a day (BID) | INTRAVENOUS | Status: DC
  Administered 2023-10-19 – 2023-10-27 (×18): 3 mL via INTRAVENOUS

## 2023-10-18 MED ORDER — SODIUM CHLORIDE 0.9 % IV SOLN
500.0000 mg | INTRAVENOUS | Status: DC
  Administered 2023-10-19: 500 mg via INTRAVENOUS
  Filled 2023-10-18: qty 5

## 2023-10-18 MED ORDER — SENNOSIDES-DOCUSATE SODIUM 8.6-50 MG PO TABS: 1.0000 | ORAL_TABLET | Freq: Every evening | ORAL | Status: DC | PRN

## 2023-10-18 MED ORDER — SODIUM CHLORIDE 0.9% FLUSH: 10.0000 mL | INTRAVENOUS | Status: DC | PRN

## 2023-10-18 MED ORDER — CEFEPIME HCL 2 G IV SOLR
2.0000 g | Freq: Once | INTRAVENOUS | Status: AC
  Administered 2023-10-18: 2 g via INTRAVENOUS
  Filled 2023-10-18 (×2): qty 12.5

## 2023-10-18 NOTE — ED Triage Notes (Signed)
 Patient brought to ED by EMS from [facility]. Pt was at facility receiving IV ABT for PNA. 7 day course of ABT ended today. Facility reported to EMS pt has been worse over the last 3 days with [facility]. Repeat CXR completed today and still shows PNA. O2 sat 87% on 4 liters. EMS reports saturation increased with duo nebs. CBG 126. 100 mg of LR received in route. Albuterol  and Atrovent  also given in route, pt showed improvement with sats at 97-98%. PICC line to right bicep area.

## 2023-10-18 NOTE — ED Notes (Signed)
 Received report from jasmine, Char[employer] fundraiser. Pt has PICC line that flushes but will not draw back, IV team consult has been put in. abx have not been given dt collecting blood cultures prior and difficulty obtaining blood. Multiple peripheral IV attempts have been made as well

## 2023-10-18 NOTE — H&P (Addendum)
 History and Physical    Megan Pitts FMW:[id number] DOB: August 23, 1927 DOA: 10/18/2023  PCP: Delores Corean Pollen, MD   Patient coming from: SNF   Chief Complaint: worsening SOB, increased oxygen requirement  HPI: Megan Pitts is a 89 y.o. female with medical history significant for hypertension, hyperlipidemia, chronic HFpEF, dementia, hypothyroidism, atrial fibrillation on Eliquis, and continued hypoxic respiratory failure since admission for pneumonia in August 2025, now presenting from her SNF for worsening shortness of breath and increased oxygen requirement.  Patient reportedly completed a 7-day course of antibiotics for pneumonia yesterday but has had 3 days of worsening shortness of breath.  She has been on 4 L/min of supplemental oxygen since discharge from LTAC on 10/08/2023.  She was saturating in the mid 80s on that today.  She denies significant cough, denies chest pain, denies fevers or chills, and denies leg swelling or tenderness.  She complains of worsening shortness of breath and fatigue.  She was treated with albuterol , Atrovent , and 100 mL of IVF by EMS prior to arrival in the ED.  ED Course: Upon arrival to the ED, patient is found to be afebrile and saturating mid 80s on 4 L/min of supplemental oxygen with tachypnea, normal HR, and stable BP.  Labs are most notable for creatinine 1.21, albumin 2.9, WBC 18,700, and BNP 412.  Chest x-ray appears similar to 10/07/2023 with possible combination of infection/inflammation and edema.  Blood cultures were collected in the ED, patient was placed on heated high flow oxygen, and was treated with Zyvox, cefepime, and azithromycin .  Review of Systems:  All other systems reviewed and apart from HPI, are negative.  Past Medical History:  Diagnosis Date   A-fib [facility])    Aortic stenosis    s/p AVR 07/2005(porcine)   Carotid bruit 09/2008   <50% B   GERD (gastroesophageal reflux disease)    Gilbert's syndrome    Hyperlipidemia     Hypertension    IFG (impaired fasting glucose)    OAB (overactive bladder)    Osteopenia    Phlebitis    UPPER EXTREMITY   PVC's (premature ventricular contractions)    Shingles    Varicose veins    s/p treatment    Vertigo     Past Surgical History:  Procedure Laterality Date   ABDOMINAL HYSTERECTOMY  1963   partial   AORTIC VALVE REPLACEMENT  08/01/2005   WITH A #23MM TORONTO STENTLESS PORCINE AORTIC VALVE   CARDIAC CATHETERIZATION  07/25/2005   EF 60% THE AORTIC VALVE IS HEAVILY CALCIFIED WITH REDUCED OPENING. NO MVP   FOOT SURGERY     HAND SURGERY     HEMORRHOID SURGERY  1980's   LUMBAR LAMINECTOMY/DECOMPRESSION MICRODISCECTOMY N/A 06/15/2023   Procedure: LUMBAR LAMINECTOMY MICRODISCECTOMY LUMBAR TWO-THREE;  Surgeon: Gillie Duncans, MD;  Location: [facility] OR;  Service: Neurosurgery;  Laterality: N/A;  L2-3 Lumbar Discectomy   ORIF ANKLE FRACTURE Right 09/11/2020   Procedure: OPEN REDUCTION INTERNAL FIXATION (ORIF) ANKLE FRACTURE;  Surgeon: Cristy Bonner DASEN, MD;  Location: [facility] OR;  Service: Orthopedics;  Laterality: Right;   US  ECHOCARDIOGRAPHY  04/14/2008   EF 55-60%. NORMAL   US  ECHOCARDIOGRAPHY  11/21/2005   EF 55-60%   US  ECHOCARDIOGRAPHY  07/20/2005   EF 55-60%   US  ECHOCARDIOGRAPHY  10/19/2004   EF 55-60%   VARICOSE VEIN SURGERY  2010   laser treatment    Social History:   reports that she has never smoked. She has never used smokeless tobacco. She reports that  she does not drink alcohol  and does not use drugs.  No Known Allergies  Family History  Problem Relation Age of Onset   Pneumonia Mother 101   Alzheimer's disease Father    Leukemia Brother    Alcohol  abuse Brother      Prior to Admission medications   Medication Sig Start Date End Date Taking? Authorizing Provider  predniSONE (DELTASONE) 20 MG tablet Take 20 mg by mouth daily with breakfast.   Yes [provider]  amLODipine  (NORVASC ) 10 MG tablet Take 10 mg by mouth daily. 03/19/19   [provider]  aspirin  EC 81 MG tablet Take 81 mg by mouth daily. Swallow whole.    [provider]  azithromycin  (ZITHROMAX ) 250 MG tablet Take 1 tab daily 09/07/23   Drusilla Sabas RAMAN, MD  budesonide  (PULMICORT ) 0.25 MG/2ML nebulizer solution Take 2 mLs (0.25 mg total) by nebulization 2 (two) times daily. 09/06/23   Drusilla Sabas RAMAN, MD  donepezil  (ARICEPT ) 10 MG tablet Take 10 mg by mouth at bedtime.  01/11/15   [provider]  furosemide  (LASIX ) 40 MG tablet Take 1 tablet (40 mg total) by mouth daily. 09/06/23 09/05/24  Drusilla Sabas RAMAN, MD  guaiFENesin -dextromethorphan  (ROBITUSSIN DM) 100-10 MG/5ML syrup Take 5 mLs by mouth every 4 (four) hours as needed for cough. 09/06/23   Drusilla Sabas RAMAN, MD  ipratropium (ATROVENT ) 0.02 % nebulizer solution Take 2.5 mLs (0.5 mg total) by nebulization every 6 (six) hours. 09/06/23   Drusilla Sabas RAMAN, MD  levalbuterol  (XOPENEX ) 0.63 MG/3ML nebulizer solution Take 3 mLs (0.63 mg total) by nebulization every 6 (six) hours. 09/06/23   Drusilla Sabas RAMAN, MD  levothyroxine  (SYNTHROID ) 50 MCG tablet Take 50 mcg by mouth daily. 09/18/22   [provider]  methylPREDNISolone  sodium succinate (SOLU-MEDROL ) 40 mg/mL injection Inject 1 mL (40 mg total) into the vein every 12 (twelve) hours. 09/06/23   Drusilla Sabas RAMAN, MD  olmesartan (BENICAR) 40 MG tablet Take 40 mg by mouth daily.    [provider]  Vitamin D , Ergocalciferol , (DRISDOL ) 50000 units CAPS capsule Take 50,000 Units by mouth every Wednesday. On Wednesday    [provider]    Physical Exam: Vitals:   10/18/23 2030 10/18/23 2045 10/18/23 2100 10/18/23 2130  BP: (!) 147/47 (!) 151/50 (!) 151/58   Pulse: 71 70 75 70  Resp: 18 17 18 19   Temp:      TempSrc:      SpO2: 99% 100% 100% 99%  Weight:      Height:        Constitutional: NAD, no diaphoresis   Eyes: PERTLA, lids and conjunctivae normal ENMT: Mucous membranes are moist. Posterior pharynx clear of any exudate or lesions.    Neck: supple, no masses  Respiratory: Coarse rales bilaterally. No wheezing. No accessory muscle use.  Cardiovascular: S1 & S2 heard, regular rate and rhythm. No extremity edema.   Abdomen: No tenderness, soft. Bowel sounds active.  Musculoskeletal: no clubbing / cyanosis. No joint deformity upper and lower extremities.   Skin: no significant rashes, lesions, ulcers. Warm, dry, well-perfused. Neurologic: CN 2-12 grossly intact. Moving all extremities. Alert and oriented.  Psychiatric: Calm. Cooperative.    Labs and Imaging on Admission: I have personally reviewed following labs and imaging studies  CBC: Recent Labs  Lab 10/18/23 1943 10/18/23 2021  WBC 18.7*  --   NEUTROABS 13.4*  --   HGB 11.4* 11.6*  11.6*  HCT 35.2* 34.0*  34.0*  MCV 87.8  --   PLT 276  --    Basic Metabolic Panel: Recent Labs  Lab 10/18/23 1943 10/18/23 2021  NA 138 139  138  K 3.6 3.7  3.7  CL 100 102  CO2 23  --   GLUCOSE 152* 150*  BUN 29* 33*  CREATININE 1.21* 1.10*  CALCIUM  8.5*  --    GFR: Estimated Creatinine Clearance: 30.2 mL/min (A) (by C-G formula based on SCr of 1.1 mg/dL (H)). Liver Function Tests: Recent Labs  Lab 10/18/23 1943  AST 21  ALT 21  ALKPHOS 44  BILITOT 1.3*  PROT 6.0*  ALBUMIN 2.9*   No results for input(s): LIPASE, AMYLASE in the last 168 hours. No results for input(s): AMMONIA in the last 168 hours. Coagulation Profile: Recent Labs  Lab 10/18/23 1943  INR 1.0   Cardiac Enzymes: No results for input(s): CKTOTAL, CKMB, CKMBINDEX, TROPONINI in the last 168 hours. BNP (last 3 results) No results for input(s): PROBNP in the last 8760 hours. HbA1C: No results for input(s): HGBA1C in the last 72 hours. CBG: No results for input(s): GLUCAP in the last 168 hours. Lipid Profile: No results for input(s): CHOL, HDL, LDLCALC, TRIG, CHOLHDL, LDLDIRECT in the last 72 hours. Thyroid  Function Tests: No results for input(s):  TSH, T4TOTAL, FREET4, T3FREE, THYROIDAB in the last 72 hours. Anemia Panel: No results for input(s): VITAMINB12, FOLATE, FERRITIN, TIBC, IRON, RETICCTPCT in the last 72 hours. Urine analysis:    Component Value Date/Time   COLORURINE YELLOW 09/03/2023 2252   APPEARANCEUR CLEAR 09/03/2023 2252   LABSPEC 1.018 09/03/2023 2252   PHURINE 6.0 09/03/2023 2252   GLUCOSEU NEGATIVE 09/03/2023 2252   HGBUR NEGATIVE 09/03/2023 2252   BILIRUBINUR NEGATIVE 09/03/2023 2252   KETONESUR NEGATIVE 09/03/2023 2252   PROTEINUR 100 (A) 09/03/2023 2252   NITRITE NEGATIVE 09/03/2023 2252   LEUKOCYTESUR NEGATIVE 09/03/2023 2252   Sepsis Labs: @LABRCNTIP (procalcitonin:4,lacticidven:4) )No results found for this or any previous visit (from the past 240 hours).   Radiological Exams on Admission: DG Chest Portable 1 View Result Date: 10/18/2023 CLINICAL DATA:  shob EXAM: PORTABLE CHEST 1 VIEW COMPARISON:  Chest x-ray 10/07/2023 FINDINGS: The heart and mediastinal contours are unchanged. Atherosclerotic plaque. Similar-appearing patchy airspace and interstitial opacities. No pulmonary edema. Possible trace bilateral pleural effusion. No pneumothorax. No acute osseous abnormality. IMPRESSION: 1. Similar-appearing patchy airspace and interstitial opacities. Possible trace bilateral pleural effusion. Findings may represent a combination of infection/inflammation versus edema. 2.  Aortic Atherosclerosis (ICD10-I70.0). Electronically Signed   By: Morgane  Naveau M.D.   On: 10/18/2023 19:21    EKG: Independently reviewed. Sinus rhythm, PAC.   Assessment/Plan   1. Pneumonia; acute hypoxic respiratory failure  - Presents with increased SOB and O2 saturation mid-80s on 4 Lpm supplemental O2  - She does not appear particularly hypervolemic; she has been on Eliquis, making PE less likely   - Culture sputum, check strep pneumo and legionella antigens, check MRSA pcr, continue cefepime and  azithromycin , and continue supplemental O2; she has been on 20 mg prednisone daily, will continue    2. Chronic HFpEF  - Appears compensated  - Continue Lasix , monitor volume status    3. PAF  - Continue diltiazem, metoprolol, and Eliquis   4. CKD 3A  - SCr is 1.21, appears slightly up from baseline - Renally-dose medications, monitor    5. Hypothyroidism  - Synthroid     6. Dementia  - Continue Aricept , use delirium precautions     DVT  prophylaxis: Eliquis   Code Status: DNR/DNI, confirmed with patient on admission  Level of Care: Level of care: Progressive Family Communication: Family updated by ED  Disposition Plan:  Patient is from: SNF  Anticipated d/c is to: TBD Anticipated d/c date is: 10/21/23  Patient currently: Pending improved respiratory status  Consults called: None  Admission status: Inpatient   Evalene GORMAN Sprinkles, MD [facility]  10/18/2023, 10:32 PM

## 2023-10-18 NOTE — ED Provider Notes (Signed)
 [place] EMERGENCY DEPARTMENT AT [facility] Provider Note  CSN: [id number] Arrival date & time: 10/18/23 1736  Chief Complaint(s) [facility]  HPI Megan Pitts is a 89 y.o. female history of recent admission for multifocal pneumonia, CKD, CHF presenting with shortness of breath.  Patient was admitted for pneumonia, discharged to LTAC, discharged around 1 week ago for rehab.  Per daughter has not really gotten any better.  Antibiotics stopped yesterday.  Over the past 2 to 3 days patient has had worsening shortness of breath.  She reports some cough, no phlegm.  Denies any chest pain, back pain, abdominal pain, nausea or vomiting.  Denies any fevers or chills.  Reports feeling very tired and fatigued.   Past Medical History Past Medical History:  Diagnosis Date   A-fib [facility])    Aortic stenosis    s/p AVR 07/2005(porcine)   Carotid bruit 09/2008   <50% B   GERD (gastroesophageal reflux disease)    Gilbert's syndrome    Hyperlipidemia    Hypertension    IFG (impaired fasting glucose)    OAB (overactive bladder)    Osteopenia    Phlebitis    UPPER EXTREMITY   PVC's (premature ventricular contractions)    Shingles    Varicose veins    s/p treatment    Vertigo    Patient Active Problem List   Diagnosis Date Noted   Osteoporosis, postmenopausal 09/18/2023   Acute respiratory failure with hypoxia (HCC) 09/03/2023   Multifocal pneumonia 09/03/2023   Hyponatremia 09/03/2023   Paroxysmal atrial fibrillation (HCC)    HNP (herniated nucleus pulposus), lumbar 06/16/2023   Lumbar disc herniation 06/11/2023   Hypothyroidism 06/11/2023   Thrombocytosis 06/11/2023   Memory impairment 06/11/2023   CKD (chronic kidney disease), stage III (HCC) 06/11/2023   Closed right ankle fracture 09/10/2020   CKD (chronic kidney disease), stage II 09/10/2020   Cervical spine fracture (HCC) 06/03/2019   Syncope, vasovagal 09/27/2017   Syncope and collapse 09/26/2017   Fracture dislocation of  right shoulder joint 09/24/2017   Shoulder fracture, right 09/24/2017   Chronic diastolic CHF (congestive heart failure) (HCC) 09/24/2017   Leucocytosis 09/24/2017   Cellulitis 05/05/2015   S/P AVR 10/26/2010   Aortic stenosis    Essential hypertension    PVC's (premature ventricular contractions)    Hyperlipidemia    Home Medication(s) Prior to Admission medications   Medication Sig Start Date End Date Taking? Authorizing Provider  amLODipine  (NORVASC ) 10 MG tablet Take 10 mg by mouth daily. 03/19/19   [provider]  aspirin  EC 81 MG tablet Take 81 mg by mouth daily. Swallow whole.    [provider]  azithromycin  (ZITHROMAX ) 250 MG tablet Take 1 tab daily 09/07/23   Drusilla Sabas RAMAN, MD  budesonide  (PULMICORT ) 0.25 MG/2ML nebulizer solution Take 2 mLs (0.25 mg total) by nebulization 2 (two) times daily. 09/06/23   Drusilla Sabas RAMAN, MD  donepezil  (ARICEPT ) 10 MG tablet Take 10 mg by mouth at bedtime.  01/11/15   [provider]  furosemide  (LASIX ) 40 MG tablet Take 1 tablet (40 mg total) by mouth daily. 09/06/23 09/05/24  Drusilla Sabas RAMAN, MD  guaiFENesin -dextromethorphan  (ROBITUSSIN DM) 100-10 MG/5ML syrup Take 5 mLs by mouth every 4 (four) hours as needed for cough. 09/06/23   Drusilla Sabas RAMAN, MD  ipratropium (ATROVENT ) 0.02 % nebulizer solution Take 2.5 mLs (0.5 mg total) by nebulization every 6 (six) hours. 09/06/23   Drusilla Sabas RAMAN, MD  levalbuterol  (XOPENEX ) 0.63 MG/3ML  nebulizer solution Take 3 mLs (0.63 mg total) by nebulization every 6 (six) hours. 09/06/23   Drusilla Sabas RAMAN, MD  levothyroxine  (SYNTHROID ) 50 MCG tablet Take 50 mcg by mouth daily. 09/18/22   [provider]  methylPREDNISolone  sodium succinate (SOLU-MEDROL ) 40 mg/mL injection Inject 1 mL (40 mg total) into the vein every 12 (twelve) hours. 09/06/23   Drusilla Sabas RAMAN, MD  olmesartan (BENICAR) 40 MG tablet Take 40 mg by mouth daily.    [provider]  Vitamin D , Ergocalciferol , (DRISDOL ) 50000  units CAPS capsule Take 50,000 Units by mouth every Wednesday. On Wednesday    [provider]                                                                                                                                    Past Surgical History Past Surgical History:  Procedure Laterality Date   ABDOMINAL HYSTERECTOMY  1963   partial   AORTIC VALVE REPLACEMENT  08/01/2005   WITH A #23MM TORONTO STENTLESS PORCINE AORTIC VALVE   CARDIAC CATHETERIZATION  07/25/2005   EF 60% THE AORTIC VALVE IS HEAVILY CALCIFIED WITH REDUCED OPENING. NO MVP   FOOT SURGERY     HAND SURGERY     HEMORRHOID SURGERY  1980's   LUMBAR LAMINECTOMY/DECOMPRESSION MICRODISCECTOMY N/A 06/15/2023   Procedure: LUMBAR LAMINECTOMY MICRODISCECTOMY LUMBAR TWO-THREE;  Surgeon: Gillie Duncans, MD;  Location: [facility] OR;  Service: Neurosurgery;  Laterality: N/A;  L2-3 Lumbar Discectomy   ORIF ANKLE FRACTURE Right 09/11/2020   Procedure: OPEN REDUCTION INTERNAL FIXATION (ORIF) ANKLE FRACTURE;  Surgeon: Cristy Bonner DASEN, MD;  Location: [facility] OR;  Service: Orthopedics;  Laterality: Right;   US  ECHOCARDIOGRAPHY  04/14/2008   EF 55-60%. NORMAL   US  ECHOCARDIOGRAPHY  11/21/2005   EF 55-60%   US  ECHOCARDIOGRAPHY  07/20/2005   EF 55-60%   US  ECHOCARDIOGRAPHY  10/19/2004   EF 55-60%   VARICOSE VEIN SURGERY  2010   laser treatment   Family History Family History  Problem Relation Age of Onset   Pneumonia Mother 60   Alzheimer's disease Father    Leukemia Brother    Alcohol  abuse Brother     Social History Social History   Tobacco Use   Smoking status: Never   Smokeless tobacco: Never  Vaping Use   Vaping status: Never Used  Substance Use Topics   Alcohol  use: No   Drug use: No   Allergies Patient has no known allergies.  Review of Systems Review of Systems  All other systems reviewed and are negative.   Physical Exam Vital Signs  I have reviewed the triage vital signs BP (!) 151/58   Pulse 75   Temp 97.7 F (36.5  C) (Oral)   Resp 18   Ht 5' 3 (1.6 m)   Wt 70.8 kg   SpO2 100%   BMI 27.63 kg/m  Physical Exam Vitals and nursing note reviewed.  Constitutional:      General: She is not in acute distress.    Appearance: She is well-developed.  HENT:     Head: Normocephalic and atraumatic.     Mouth/Throat:     Mouth: Mucous membranes are moist.  Eyes:     Pupils: Pupils are equal, round, and reactive to light.  Cardiovascular:     Rate and Rhythm: Normal rate and regular rhythm.     Heart sounds: No murmur heard. Pulmonary:     Effort: Tachypnea, accessory muscle usage and respiratory distress present.     Breath sounds: Examination of the left-upper field reveals rales. Examination of the left-middle field reveals rales. Examination of the right-lower field reveals rales. Examination of the left-lower field reveals rales. Rales present.  Abdominal:     General: Abdomen is flat.     Palpations: Abdomen is soft.     Tenderness: There is no abdominal tenderness.  Musculoskeletal:        General: No tenderness.     Right lower leg: No edema.     Left lower leg: No edema.  Skin:    General: Skin is warm and dry.  Neurological:     General: No focal deficit present.     Mental Status: She is alert. Mental status is at baseline.  Psychiatric:        Mood and Affect: Mood normal.        Behavior: Behavior normal.     ED Results and Treatments Labs (all labs ordered are listed, but only abnormal results are displayed) Labs Reviewed  COMPREHENSIVE METABOLIC PANEL WITH GFR - Abnormal; Notable for the following components:      Result Value   Glucose, Bld 152 (*)    BUN 29 (*)    Creatinine, Ser 1.21 (*)    Calcium  8.5 (*)    Total Protein 6.0 (*)    Albumin 2.9 (*)    Total Bilirubin 1.3 (*)    GFR, Estimated 42 (*)    All other components within normal limits  CBC WITH DIFFERENTIAL/PLATELET - Abnormal; Notable for the following components:   WBC 18.7 (*)    Hemoglobin 11.4 (*)     HCT 35.2 (*)    RDW 17.9 (*)    Neutro Abs 13.4 (*)    Monocytes Absolute 1.7 (*)    Abs Immature Granulocytes 0.71 (*)    All other components within normal limits  BRAIN NATRIURETIC PEPTIDE - Abnormal; Notable for the following components:   B Natriuretic Peptide 411.7 (*)    All other components within normal limits  I-STAT VENOUS BLOOD GAS, ED - Abnormal; Notable for the following components:   pCO2, Ven 43.7 (*)    pO2, Ven 160 (*)    HCT 34.0 (*)    Hemoglobin 11.6 (*)    All other components within normal limits  I-STAT CHEM 8, ED - Abnormal; Notable for the following components:   BUN 33 (*)    Creatinine, Ser 1.10 (*)    Glucose, Bld 150 (*)    Hemoglobin 11.6 (*)    HCT 34.0 (*)    All other components within normal limits  CULTURE, BLOOD (ROUTINE X 2)  CULTURE, BLOOD (ROUTINE X 2)  RESP PANEL BY RT-PCR (RSV, FLU A&B, COVID)  RVPGX2  PROTIME-INR  Radiology DG Chest Portable 1 View Result Date: 10/18/2023 CLINICAL DATA:  shob EXAM: PORTABLE CHEST 1 VIEW COMPARISON:  Chest x-ray 10/07/2023 FINDINGS: The heart and mediastinal contours are unchanged. Atherosclerotic plaque. Similar-appearing patchy airspace and interstitial opacities. No pulmonary edema. Possible trace bilateral pleural effusion. No pneumothorax. No acute osseous abnormality. IMPRESSION: 1. Similar-appearing patchy airspace and interstitial opacities. Possible trace bilateral pleural effusion. Findings may represent a combination of infection/inflammation versus edema. 2.  Aortic Atherosclerosis (ICD10-I70.0). Electronically Signed   By: Morgane  Naveau M.D.   On: 10/18/2023 19:21    Pertinent labs & imaging results that were available during my care of the patient were reviewed by me and considered in my medical decision making (see MDM for details).  Medications Ordered in  ED Medications  ceFEPIme (MAXIPIME) 2 g in sodium chloride  0.9 % 100 mL IVPB (2 g Intravenous New Bag/Given 10/18/23 2112)  sodium chloride  flush (NS) 0.9 % injection 10-40 mL (has no administration in time range)  linezolid (ZYVOX) IVPB 600 mg (0 mg Intravenous Stopped 10/18/23 2113)  azithromycin  (ZITHROMAX ) 500 mg in sodium chloride  0.9 % 250 mL IVPB (0 mg Intravenous Stopped 10/18/23 2113)                                                                                                                                     Procedures .Critical Care  Performed by: Francesca Elsie CROME, MD Authorized by: Francesca Elsie CROME, MD   Critical care provider statement:    Critical care time (minutes):  30   Critical care was necessary to treat or prevent imminent or life-threatening deterioration of the following conditions:  Respiratory failure   Critical care was time spent personally by me on the following activities:  Development of treatment plan with patient or surrogate, discussions with consultants, evaluation of patient's response to treatment, examination of patient, ordering and review of laboratory studies, ordering and review of radiographic studies, ordering and performing treatments and interventions, pulse oximetry, re-evaluation of patient's condition and review of old charts   Care discussed with: admitting provider     (including critical care time)  Medical Decision Making / ED Course   MDM:  89 year old presenting to the emergency department with shortness of breath.  Patient with mild respiratory distress, found to be hypoxic on recent baseline 4 L.  Initially placed on nonrebreather.  Placed patient on heated high flow for comfort.  Differential includes recurrent pneumonia, pulmonary edema, CHF, interstitial lung disease.  Will cover with broad-spectrum antibiotics given recent history, chest x-ray shows possible multifocal pneumonia.  Anticipate patient will need  admission.  Will obtain blood cultures.  Patient currently appears euvolemic so would defer diuretics at this time.  Will reassess.  Clinical Course as of 10/18/23 2130  Thu Oct 18, 2023  2128 Workup concerning for persistent multifocal pneumonia.  Discussed with Dr. Charlton, who has admitted patient for further management. [WS]  Clinical Course User Index [WS] Francesca Elsie CROME, MD     Additional history obtained: -Additional history obtained from family and ems -External records from outside source obtained and reviewed including: Chart review including previous notes, labs, imaging, consultation notes including prior hospitalization   Lab Tests: -I ordered, reviewed, and interpreted labs.   The pertinent results include:   Labs Reviewed  COMPREHENSIVE METABOLIC PANEL WITH GFR - Abnormal; Notable for the following components:      Result Value   Glucose, Bld 152 (*)    BUN 29 (*)    Creatinine, Ser 1.21 (*)    Calcium  8.5 (*)    Total Protein 6.0 (*)    Albumin 2.9 (*)    Total Bilirubin 1.3 (*)    GFR, Estimated 42 (*)    All other components within normal limits  CBC WITH DIFFERENTIAL/PLATELET - Abnormal; Notable for the following components:   WBC 18.7 (*)    Hemoglobin 11.4 (*)    HCT 35.2 (*)    RDW 17.9 (*)    Neutro Abs 13.4 (*)    Monocytes Absolute 1.7 (*)    Abs Immature Granulocytes 0.71 (*)    All other components within normal limits  BRAIN NATRIURETIC PEPTIDE - Abnormal; Notable for the following components:   B Natriuretic Peptide 411.7 (*)    All other components within normal limits  I-STAT VENOUS BLOOD GAS, ED - Abnormal; Notable for the following components:   pCO2, Ven 43.7 (*)    pO2, Ven 160 (*)    HCT 34.0 (*)    Hemoglobin 11.6 (*)    All other components within normal limits  I-STAT CHEM 8, ED - Abnormal; Notable for the following components:   BUN 33 (*)    Creatinine, Ser 1.10 (*)    Glucose, Bld 150 (*)    Hemoglobin 11.6 (*)    HCT  34.0 (*)    All other components within normal limits  CULTURE, BLOOD (ROUTINE X 2)  CULTURE, BLOOD (ROUTINE X 2)  RESP PANEL BY RT-PCR (RSV, FLU A&B, COVID)  RVPGX2  PROTIME-INR    Notable for leukocytosis,CKD  EKG   EKG Interpretation Date/Time:  Thursday October 18 2023 17:38:02 EDT Ventricular Rate:  80 PR Interval:  172 QRS Duration:  88 QT Interval:  373 QTC Calculation: 431 R Axis:   27  Text Interpretation: Sinus rhythm Atrial premature complex Consider left atrial enlargement Anterior infarct, old Confirmed by Francesca Elsie ([id number]) on 10/18/2023 6:25:54 PM         Imaging Studies ordered: I ordered imaging studies including DG chest On my interpretation imaging demonstrates pneumonia I independently visualized and interpreted imaging. I agree with the radiologist interpretation   Medicines ordered and prescription drug management: Meds ordered this encounter  Medications   ceFEPIme (MAXIPIME) 2 g in sodium chloride  0.9 % 100 mL IVPB    Antibiotic Indication::   HCAP   linezolid (ZYVOX) IVPB 600 mg    Antibiotic Indication::   HCAP   azithromycin  (ZITHROMAX ) 500 mg in sodium chloride  0.9 % 250 mL IVPB   sodium chloride  flush (NS) 0.9 % injection 10-40 mL    -I have reviewed the patients home medicines and have made adjustments as needed   Consultations Obtained: I requested consultation with the hospitalist,  and discussed lab and imaging findings as well as pertinent plan - they recommend: admission   Cardiac Monitoring: The patient was maintained on a cardiac monitor.  I personally viewed  and interpreted the cardiac monitored which showed an underlying rhythm of: NSR  Reevaluation: After the interventions noted above, I reevaluated the patient and found that their symptoms have stayed the same  Co morbidities that complicate the patient evaluation  Past Medical History:  Diagnosis Date   A-fib [facility])    Aortic stenosis    s/p AVR  07/2005(porcine)   Carotid bruit 09/2008   <50% B   GERD (gastroesophageal reflux disease)    Gilbert's syndrome    Hyperlipidemia    Hypertension    IFG (impaired fasting glucose)    OAB (overactive bladder)    Osteopenia    Phlebitis    UPPER EXTREMITY   PVC's (premature ventricular contractions)    Shingles    Varicose veins    s/p treatment    Vertigo       Dispostion: Disposition decision including need for hospitalization was considered, and patient admitted to the hospital.    Final Clinical Impression(s) / ED Diagnoses Final diagnoses:  Acute on chronic hypoxic respiratory failure (HCC)  Multifocal pneumonia     This chart was dictated using voice recognition software.  Despite best efforts to proofread,  errors can occur which can change the documentation meaning.    Francesca Elsie CROME, MD 10/18/23 2130

## 2023-10-18 NOTE — Progress Notes (Signed)
   10/18/23 1855  Therapy Vitals  Pulse Rate 64  Resp 20  MEWS Score/Color  MEWS Score 0  MEWS Score Color Green  Oxygen Therapy/Pulse Ox  SpO2 94 %  O2 Device HHFNC  O2 Therapy Oxygen humidified  O2 Flow Rate (L/min) (S)  30 L/min  FiO2 (%) (S)  50 %  $ High Flow Nasal Cannula  Yes  Heated High Flow Nasal Cannula  Adult Medium    Pt placed on HHFNC per MD.

## 2023-10-18 NOTE — ED Notes (Signed)
 IV team at bedside

## 2023-10-19 ENCOUNTER — Inpatient Hospital Stay (HOSPITAL_COMMUNITY)

## 2023-10-19 DIAGNOSIS — J9601 Acute respiratory failure with hypoxia: Secondary | ICD-10-CM | POA: Diagnosis not present

## 2023-10-19 LAB — CBC
HCT: 31.6 % — ABNORMAL LOW (ref 36.0–46.0)
Hemoglobin: 10.3 g/dL — ABNORMAL LOW (ref 12.0–15.0)
MCH: 28.8 pg (ref 26.0–34.0)
MCHC: 32.6 g/dL (ref 30.0–36.0)
MCV: 88.3 fL (ref 80.0–100.0)
Platelets: 233 K/uL (ref 150–400)
RBC: 3.58 MIL/uL — ABNORMAL LOW (ref 3.87–5.11)
RDW: 18.1 % — ABNORMAL HIGH (ref 11.5–15.5)
WBC: 14.9 K/uL — ABNORMAL HIGH (ref 4.0–10.5)
nRBC: 0 % (ref 0.0–0.2)

## 2023-10-19 LAB — BASIC METABOLIC PANEL WITH GFR
Anion gap: 13 (ref 5–15)
BUN: 28 mg/dL — ABNORMAL HIGH (ref 8–23)
CO2: 22 mmol/L (ref 22–32)
Calcium: 8.2 mg/dL — ABNORMAL LOW (ref 8.9–10.3)
Chloride: 103 mmol/L (ref 98–111)
Creatinine, Ser: 1.23 mg/dL — ABNORMAL HIGH (ref 0.44–1.00)
GFR, Estimated: 41 mL/min — ABNORMAL LOW (ref >60)
Glucose, Bld: 86 mg/dL (ref 70–99)
Potassium: 4.9 mmol/L (ref 3.5–5.1)
Sodium: 138 mmol/L (ref 135–145)

## 2023-10-19 LAB — MAGNESIUM: Magnesium: 1.7 mg/dL (ref 1.7–2.4)

## 2023-10-19 NOTE — Progress Notes (Signed)
  Progress Note   Patient: Megan Pitts FMW:[id number] DOB: [birth year] DOA: 10/18/2023     1 DOS: the patient was seen and examined on 10/19/2023   Brief hospital course:   89 y.o. female with medical history significant for hypertension, hyperlipidemia, chronic HFpEF, dementia, hypothyroidism, atrial fibrillation on Eliquis, and continued hypoxic respiratory failure since admission for pneumonia in August 2025, now presenting from her SNF for worsening shortness of breath and increased oxygen requirement. She recently completed a 7-day course of antibiotics for pneumonia. Admitted for treatment of PNA, AHRF and antibiotics have been initiated.  Assessment and Plan:  Acute hypoxic respiratory failure, likely in the setting of possible bacterial pneumonia,POA: Continue with cefepime and azithromycin . She did receive a dose of IV Zyvox -Tested negative for COVID,RSV and Influenza -Ordered CT chest because of recurrent pneumonia. -NGTD on blood cultures -f/u sputum cultures and legionella antigen  Chronic HFpEF,POA:  - Appears compensated  -ECHO done in August 2025 showed normal EF but Grade II diastolic dysfunction. - Continue Lasix , monitor volume status     PAF  - Continue diltiazem, metoprolol, and Eliquis    CKD 3A  - SCr is 1.21, appears slightly up from baseline - Renally-dose medications, monitor     Hypothyroidism  - Synthroid      Dementia  - Continue Aricept , use delirium precautions    Disposition: Back to [facility].      Subjective: She says that her shortness of breath is slightly better today, Spoke to her daughter on the phone and she told me that she was in [place] and then transferred to [facility] rehab. We talked about getting a CT chest.   Physical Exam: Vitals:   10/19/23 0248 10/19/23 0314 10/19/23 0445 10/19/23 0933  BP: (!) 132/47  (!) 160/54 (!) 127/48  Pulse: (!) 59  64 74  Resp:   17 (!) 21  Temp: 98.3 F (36.8 C)  98.4 F (36.9 C) 98.7  F (37.1 C)  TempSrc: Oral   Oral  SpO2: (!) 30% 99% 90% 98%  Weight:      Height:       Constitutional: in mild distress, nasal oxygen in place, extremely hard of hearing Eyes: PERRL, lids and conjunctivae normal ENMT: Mucous membranes are moist. Posterior pharynx clear of any exudate or lesions.Normal dentition.  Neck: normal, supple, no masses, no thyromegaly Respiratory: Coarse breath sounds bilaterally Cardiovascular: Regular rate and rhythm, no murmurs / rubs / gallops. No extremity edema. 2+ pedal pulses. No carotid bruits.  Abdomen: no tenderness, no masses palpated. No hepatosplenomegaly. Bowel sounds positive.  Musculoskeletal: no clubbing / cyanosis. No joint deformity upper and lower extremities. Good ROM, no contractures. Normal muscle tone.  Skin: no rashes, lesions, ulcers. No induration Neurologic: CN 2-12 grossly intact. Sensation intact, DTR normal. Strength 5/5 x all 4 extremities.  Psychiatric: Normal judgment and insight. Alert and oriented x 3. Normal mood.   Data Reviewed:  There are no new results to review at this time.  Family Communication: Daughter,Patti, on the phone  Disposition: Status is: Inpatient Remains inpatient appropriate because: AHRF, PNA  Planned Discharge Destination: Skilled nursing facility    Time spent: 41 minutes  Author: Deliliah Room, MD 10/19/2023 11:15 AM  For on call review [email or web address].

## 2023-10-19 NOTE — Progress Notes (Signed)
 Pt was transitioned from [facility] to HFNC 10L and is tolerating well.

## 2023-10-20 ENCOUNTER — Inpatient Hospital Stay (HOSPITAL_COMMUNITY)

## 2023-10-20 DIAGNOSIS — J811 Chronic pulmonary edema: Secondary | ICD-10-CM | POA: Diagnosis not present

## 2023-10-20 DIAGNOSIS — J949 Pleural condition, unspecified: Secondary | ICD-10-CM | POA: Diagnosis not present

## 2023-10-20 DIAGNOSIS — R918 Other nonspecific abnormal finding of lung field: Secondary | ICD-10-CM | POA: Diagnosis not present

## 2023-10-20 DIAGNOSIS — J9601 Acute respiratory failure with hypoxia: Secondary | ICD-10-CM | POA: Diagnosis not present

## 2023-10-20 LAB — BASIC METABOLIC PANEL WITH GFR
Anion gap: 10 (ref 5–15)
BUN: 32 mg/dL — ABNORMAL HIGH (ref 8–23)
CO2: 27 mmol/L (ref 22–32)
Calcium: 8.7 mg/dL — ABNORMAL LOW (ref 8.9–10.3)
Chloride: 101 mmol/L (ref 98–111)
Creatinine, Ser: 1.17 mg/dL — ABNORMAL HIGH (ref 0.44–1.00)
GFR, Estimated: 44 mL/min — ABNORMAL LOW (ref >60)
Glucose, Bld: 106 mg/dL — ABNORMAL HIGH (ref 70–99)
Potassium: 4.3 mmol/L (ref 3.5–5.1)
Sodium: 138 mmol/L (ref 135–145)

## 2023-10-20 LAB — CBC
HCT: 33 % — ABNORMAL LOW (ref 36.0–46.0)
Hemoglobin: 10.6 g/dL — ABNORMAL LOW (ref 12.0–15.0)
MCH: 28 pg (ref 26.0–34.0)
MCHC: 32.1 g/dL (ref 30.0–36.0)
MCV: 87.3 fL (ref 80.0–100.0)
Platelets: 227 K/uL (ref 150–400)
RBC: 3.78 MIL/uL — ABNORMAL LOW (ref 3.87–5.11)
RDW: 17.1 % — ABNORMAL HIGH (ref 11.5–15.5)
WBC: 13.9 K/uL — ABNORMAL HIGH (ref 4.0–10.5)
nRBC: 0 % (ref 0.0–0.2)

## 2023-10-20 LAB — TSH: TSH: 2.247 u[IU]/mL (ref 0.350–4.500)

## 2023-10-20 MED ORDER — AZITHROMYCIN 250 MG PO TABS
500.0000 mg | ORAL_TABLET | Freq: Every day | ORAL | Status: AC
Start: 2023-10-20 — End: 2023-10-22
  Administered 2023-10-20 – 2023-10-21 (×2): 500 mg via ORAL
  Filled 2023-10-20 (×2): qty 2

## 2023-10-20 NOTE — Progress Notes (Signed)
 PHARMACIST - PHYSICIAN COMMUNICATION   CONCERNING: Antibiotic IV to Oral Route Change Policy   RECOMMENDATION: This patient is receiving Azithromycin  500 mg by the intravenous route.  Based on criteria approved by the Pharmacy and Therapeutics Committee, the antibiotic(s) is being converted to the equivalent oral dose form(s).     DESCRIPTION:  These criteria include: Patient being treated for a respiratory tract infection, urinary tract infection, cellulitis or clostridium difficile associated diarrhea if on metronidazole The patient is not neutropenic and does not exhibit a GI malabsorption state The patient is eating (either orally or via tube) and/or has been taking other orally administered medications for a least 24 hours The patient is improving clinically and has a Tmax < 100.5   If you have questions about this conversion, please contact the Pharmacy Department  []   [phone] )  [facility] []   [phone] )  [facility] [x]   [phone] )  [facility] []   [phone] )  [facility] []   [phone] )  [facility]   R. Samual Satterfield, PharmD PGY-1 Acute Care Pharmacy Resident [facility] Please refer to [facility] for [facility] Pharmacy numbers 10/20/2023 1:10 PM

## 2023-10-20 NOTE — Progress Notes (Signed)
  Progress Note   Patient: Megan Pitts FMW:[id number] DOB: 02-19-1927 DOA: 10/18/2023     2 DOS: the patient was seen and examined on 10/20/2023   Brief hospital course:   89 y.o. female with medical history significant for hypertension, hyperlipidemia, chronic HFpEF, dementia, hypothyroidism, atrial fibrillation on Eliquis, and continued hypoxic respiratory failure since admission for pneumonia in August 2025, now presenting from her SNF for worsening shortness of breath and increased oxygen requirement. She recently completed a 7-day course of antibiotics for pneumonia. Admitted for treatment of PNA, AHRF and antibiotics have been initiated.  Assessment and Plan:  Acute hypoxic respiratory failure, likely in the setting of possible bacterial pneumonia,POA: Continue with cefepime and azithromycin . She did receive a dose of IV Zyvox -Tested negative for COVID,RSV and Influenza -Ordered CT chest because of recurrent pneumonia. -NGTD on blood cultures -f/u sputum cultures and legionella antigen  Chronic HFpEF,POA:  - Appears compensated  -ECHO done in August 2025 showed normal EF but Grade II diastolic dysfunction. - Continue Lasix  40 mg Po daily, monitor volume status. Close monitoring of renal panel while on lasix .   PAF  - Continue diltiazem, metoprolol, and Eliquis    CKD 3A  - SCr is 1.21, appears slightly up from baseline - Renally-dose medications, monitor     Hypothyroidism  - Synthroid      Dementia  - Continue Aricept , use delirium precautions    Disposition: Back to [facility].      Subjective: Shortness of breath has improved. She is still on 10 L nasal cannula. She is supposed to get CT Chest today.  Physical Exam: Vitals:   10/20/23 0523 10/20/23 0708 10/20/23 0907 10/20/23 0946  BP: (!) 160/71 (!) 225/56 (!) 154/45   Pulse: 63 (!) 57 65 60  Resp: 17 15  (!) 24  Temp: 97.8 F (36.6 C) (!) 97.4 F (36.3 C)    TempSrc: Oral Oral    SpO2: 99% 100%   97%  Weight:      Height:       Constitutional: in mild distress, nasal oxygen in place, extremely hard of hearing Eyes: PERRL, lids and conjunctivae normal ENMT: Mucous membranes are moist. Posterior pharynx clear of any exudate or lesions.Normal dentition.  Neck: normal, supple, no masses, no thyromegaly Respiratory: Coarse breath sounds bilaterally Cardiovascular: Regular rate and rhythm, no murmurs / rubs / gallops. No extremity edema. 2+ pedal pulses. No carotid bruits.  Abdomen: no tenderness, no masses palpated. No hepatosplenomegaly. Bowel sounds positive.  Musculoskeletal: no clubbing / cyanosis. No joint deformity upper and lower extremities. Good ROM, no contractures. Normal muscle tone.  Skin: no rashes, lesions, ulcers. No induration Neurologic: CN 2-12 grossly intact. Sensation intact, DTR normal. Strength 5/5 x all 4 extremities.  Psychiatric: Normal judgment and insight. Alert and oriented x 3. Normal mood.   Data Reviewed:  There are no new results to review at this time.  Family Communication: None at the bedside  Disposition: Status is: Inpatient Remains inpatient appropriate because: AHRF, PNA  Planned Discharge Destination: Skilled nursing facility    Time spent: 41 minutes  Author: Deliliah Room, MD 10/20/2023 10:25 AM  For on call review [email or web address].

## 2023-10-20 NOTE — Plan of Care (Signed)
   Problem: Activity: Goal: Ability to tolerate increased activity will improve Outcome: Progressing

## 2023-10-21 DIAGNOSIS — J9601 Acute respiratory failure with hypoxia: Secondary | ICD-10-CM | POA: Diagnosis not present

## 2023-10-21 LAB — BASIC METABOLIC PANEL WITH GFR
Anion gap: 11 (ref 5–15)
BUN: 29 mg/dL — ABNORMAL HIGH (ref 8–23)
CO2: 26 mmol/L (ref 22–32)
Calcium: 8.5 mg/dL — ABNORMAL LOW (ref 8.9–10.3)
Chloride: 100 mmol/L (ref 98–111)
Creatinine, Ser: 0.92 mg/dL (ref 0.44–1.00)
GFR, Estimated: 58 mL/min — ABNORMAL LOW (ref >60)
Glucose, Bld: 105 mg/dL — ABNORMAL HIGH (ref 70–99)
Potassium: 3.7 mmol/L (ref 3.5–5.1)
Sodium: 137 mmol/L (ref 135–145)

## 2023-10-21 LAB — CBC
HCT: 28.2 % — ABNORMAL LOW (ref 36.0–46.0)
Hemoglobin: 9.2 g/dL — ABNORMAL LOW (ref 12.0–15.0)
MCH: 28.5 pg (ref 26.0–34.0)
MCHC: 32.6 g/dL (ref 30.0–36.0)
MCV: 87.3 fL (ref 80.0–100.0)
Platelets: 210 K/uL (ref 150–400)
RBC: 3.23 MIL/uL — ABNORMAL LOW (ref 3.87–5.11)
RDW: 17 % — ABNORMAL HIGH (ref 11.5–15.5)
WBC: 14.5 K/uL — ABNORMAL HIGH (ref 4.0–10.5)
nRBC: 0 % (ref 0.0–0.2)

## 2023-10-21 MED ORDER — SODIUM CHLORIDE 0.9 % IV SOLN
2.0000 g | INTRAVENOUS | Status: AC
  Administered 2023-10-21 – 2023-10-24 (×4): 2 g via INTRAVENOUS
  Filled 2023-10-21 (×4): qty 20

## 2023-10-21 MED ORDER — PREDNISONE 20 MG PO TABS
40.0000 mg | ORAL_TABLET | Freq: Every day | ORAL | Status: DC
Start: 2023-10-22 — End: 2023-10-24
  Administered 2023-10-22 – 2023-10-24 (×3): 40 mg via ORAL
  Filled 2023-10-21 (×3): qty 2

## 2023-10-21 NOTE — Progress Notes (Signed)
  Progress Note   Patient: Megan Pitts FMW:[id number] DOB: Apr 11, 1927 DOA: 10/18/2023     3 DOS: the patient was seen and examined on 10/21/2023   Brief hospital course:   89 y.o. female with medical history significant for hypertension, hyperlipidemia, chronic HFpEF, dementia, hypothyroidism, atrial fibrillation on Eliquis, and continued hypoxic respiratory failure since admission for pneumonia in August 2025, now presenting from her SNF for worsening shortness of breath and increased oxygen requirement. She recently completed a 7-day course of antibiotics for pneumonia. Admitted for treatment of PNA, AHRF and antibiotics have been initiated.  Assessment and Plan:  Acute hypoxic respiratory failure, likely in the setting of possible bacterial pneumonia as well as Pulmonary fibrosis,POA: Continue with cefepime and azithromycin . She did receive a dose of IV Zyvox -Tested negative for COVID,RSV and Influenza -I reviewed her CT chest which was done on 10/11 and it showed evidence of acute on chronic disease, likely developing pulmonary fibrosis. -Increased dose of prednisone to 40 mg daily today. -NGTD on blood cultures -f/u sputum cultures and legionella antigen  Chronic HFpEF,POA:  - Appears compensated  -ECHO done in August 2025 showed normal EF but Grade II diastolic dysfunction. - Continue Lasix  40 mg Po daily, monitor volume status. Close monitoring of renal panel while on lasix .   PAF  - Continue diltiazem, metoprolol, and Eliquis    CKD 3, ruled out - Baseline creatinine is within normal limits.   Hypothyroidism  - continue Synthroid      Dementia  - Continue Aricept , use delirium precautions    Disposition: Back to [facility] rehab.      Subjective: Shortness of breath continues to improve. She had CT chest done yesterday and we talked about the findings of pulmonary fibrosis.    Vitals:   10/21/23 0421 10/21/23 0701 10/21/23 0707 10/21/23 0837  BP: (!) 173/38 121/89  (!) 178/44 (!) 147/40  Pulse: (!) 55 (!) 55 62 66  Resp: 18 (!) 24 (!) 30 (!) 21  Temp: 97.9 F (36.6 C) (!) 97.5 F (36.4 C) 98 F (36.7 C)   TempSrc: Oral Oral Oral   SpO2: 99% 98% 99% 92%  Weight:      Height:       Constitutional: in no distress, High flow nasal oxygen in place, extremely hard of hearing Eyes: PERRL, lids and conjunctivae normal ENMT: Mucous membranes are moist. Posterior pharynx clear of any exudate or lesions.Normal dentition.  Neck: normal, supple, no masses, no thyromegaly Respiratory: Coarse breath sounds bilaterally Cardiovascular: Regular rate and rhythm, no murmurs / rubs / gallops. No extremity edema. 2+ pedal pulses. No carotid bruits.  Abdomen: no tenderness, no masses palpated. No hepatosplenomegaly. Bowel sounds positive.  Musculoskeletal: no clubbing / cyanosis. No joint deformity upper and lower extremities. Good ROM, no contractures. Normal muscle tone.  Skin: no rashes, lesions, ulcers. No induration Neurologic: CN 2-12 grossly intact. Sensation intact, DTR normal. Strength 5/5 x all 4 extremities.  Psychiatric: Normal judgment and insight. Alert and oriented x 3. Normal mood.   Data Reviewed:  There are no new results to review at this time.  Family Communication: None at the bedside  Disposition: Status is: Inpatient Remains inpatient appropriate because: AHRF, PNA  Planned Discharge Destination: Skilled nursing facility    Time spent: 41 minutes  Author: Deliliah Room, MD 10/21/2023 9:33 AM  For on call review [email or web address].

## 2023-10-22 DIAGNOSIS — Z7189 Other specified counseling: Secondary | ICD-10-CM

## 2023-10-22 DIAGNOSIS — J188 Other pneumonia, unspecified organism: Secondary | ICD-10-CM | POA: Diagnosis not present

## 2023-10-22 DIAGNOSIS — J9621 Acute and chronic respiratory failure with hypoxia: Secondary | ICD-10-CM

## 2023-10-22 DIAGNOSIS — J9601 Acute respiratory failure with hypoxia: Secondary | ICD-10-CM | POA: Diagnosis not present

## 2023-10-22 DIAGNOSIS — Z515 Encounter for palliative care: Secondary | ICD-10-CM

## 2023-10-22 LAB — BASIC METABOLIC PANEL WITH GFR
Anion gap: 10 (ref 5–15)
BUN: 28 mg/dL — ABNORMAL HIGH (ref 8–23)
CO2: 26 mmol/L (ref 22–32)
Calcium: 8.4 mg/dL — ABNORMAL LOW (ref 8.9–10.3)
Chloride: 100 mmol/L (ref 98–111)
Creatinine, Ser: 1.07 mg/dL — ABNORMAL HIGH (ref 0.44–1.00)
GFR, Estimated: 48 mL/min — ABNORMAL LOW (ref >60)
Glucose, Bld: 104 mg/dL — ABNORMAL HIGH (ref 70–99)
Potassium: 3.6 mmol/L (ref 3.5–5.1)
Sodium: 136 mmol/L (ref 135–145)

## 2023-10-22 LAB — CBC
HCT: 27.2 % — ABNORMAL LOW (ref 36.0–46.0)
Hemoglobin: 8.8 g/dL — ABNORMAL LOW (ref 12.0–15.0)
MCH: 28.1 pg (ref 26.0–34.0)
MCHC: 32.4 g/dL (ref 30.0–36.0)
MCV: 86.9 fL (ref 80.0–100.0)
Platelets: 220 K/uL (ref 150–400)
RBC: 3.13 MIL/uL — ABNORMAL LOW (ref 3.87–5.11)
RDW: 17.1 % — ABNORMAL HIGH (ref 11.5–15.5)
WBC: 13.8 K/uL — ABNORMAL HIGH (ref 4.0–10.5)
nRBC: 0 % (ref 0.0–0.2)

## 2023-10-22 NOTE — Progress Notes (Signed)
 Progress Note   Patient: Megan Pitts FMW:[id number] DOB: 1927/12/25 DOA: 10/18/2023     4 DOS: the patient was seen and examined on 10/22/2023   Brief hospital course:   89 y.o. female with medical history significant for hypertension, hyperlipidemia, chronic HFpEF, dementia, hypothyroidism, atrial fibrillation on Eliquis, and continued hypoxic respiratory failure since admission for pneumonia in August 2025, now presenting from her SNF for worsening shortness of breath and increased oxygen requirement. She recently completed a 7-day course of antibiotics for pneumonia. Admitted for treatment of PNA, AHRF and antibiotics have been initiated. CT showed pulm fibrosis and she is needing high flow oxygen. Palliative consulted.  Assessment and Plan:  Acute hypoxic respiratory failure, likely in the setting of possible bacterial pneumonia as well as Pulmonary fibrosis,POA: Continue with cefepime and azithromycin . She did receive a dose of IV Zyvox -Tested negative for COVID,RSV and Influenza -I reviewed her CT chest which was done on 10/11 and it showed evidence of acute on chronic disease, likely developing pulmonary fibrosis. -Increased dose of prednisone to 40 mg daily on 10/12. -NGTD on blood cultures - Because of the DNR status, advanced age and underlying pulmonary fibrosis with need for persistently high amounts of oxygen, palliative services has been consulted.   Chronic HFpEF,POA:  - Appears compensated  -ECHO done in August 2025 showed normal EF but Grade II diastolic dysfunction. - Continue Lasix  40 mg Po daily, monitor volume status. Close monitoring of renal panel while on lasix .   PAF  - Continue diltiazem, metoprolol, and Eliquis    CKD 3, ruled out - Baseline creatinine is within normal limits.   Hypothyroidism  - continue Synthroid      Dementia  - Continue Aricept , use delirium precautions    Disposition: Back to [facility] rehab.      Subjective: Shortness of  breath continues to improve. She had CT chest done yesterday and we talked about the findings of pulmonary fibrosis.    Vitals:   10/22/23 0023 10/22/23 0425 10/22/23 0500 10/22/23 0713  BP: (!) 181/44 (!) 148/41  (!) 190/57  Pulse: (!) 55 (!) 51  61  Resp: 17 12  16   Temp: 97.6 F (36.4 C) 97.6 F (36.4 C)  (!) 97.5 F (36.4 C)  TempSrc: Oral Oral  Oral  SpO2: 98% 100%  96%  Weight:   74.8 kg   Height:       Constitutional: in no distress, High flow nasal oxygen in place at 10 L, extremely hard of hearing Eyes: PERRL, lids and conjunctivae normal ENMT: Mucous membranes are moist. Posterior pharynx clear of any exudate or lesions.Normal dentition.  Neck: normal, supple, no masses, no thyromegaly Respiratory: Coarse breath sounds bilaterally Cardiovascular: Regular rate and rhythm, no murmurs / rubs / gallops. No extremity edema. 2+ pedal pulses. No carotid bruits.  Abdomen: no tenderness, no masses palpated. No hepatosplenomegaly. Bowel sounds positive.  Musculoskeletal: no clubbing / cyanosis. No joint deformity upper and lower extremities. Good ROM, no contractures. Normal muscle tone.  Skin: no rashes, lesions, ulcers. No induration Neurologic: CN 2-12 grossly intact. Sensation intact, DTR normal. Strength 5/5 x all 4 extremities.   Data Reviewed:  There are no new results to review at this time.  Family Communication: None at the bedside  Disposition: Status is: Inpatient Remains inpatient appropriate because: AHRF, PNA  Planned Discharge Destination: Skilled nursing facility    Time spent: 41 minutes  Author: Deliliah Room, MD 10/22/2023 9:28 AM  For on call review [email or web address].

## 2023-10-22 NOTE — Evaluation (Signed)
 Physical Therapy Evaluation Patient Details Name: Megan Pitts MRN: [id number] DOB: May 24, 1927 Today's Date: 10/22/2023  History of Present Illness  89 y/o F adm from [facility] 10/18/23 with SOB, respiratory failure. PMHx: Afib, AVR, GERD, HLD, HTN, shingles, HFpEF, vertigo, dementia, L2-3 lami  Clinical Impression  Pt pleasant with inconsistent PLOF stating she gets to [facility] and sponge bathes but also stating RLE doesn't work since laminectomy. Pt with Rt hip flexion 2/5, knee flexion 3/5 and knee extension 2/5. Pt with desaturation with limited activity this session with 100% on 10L at rest, drop to 85% with sitting EOB and 76% with standing trials, increased time in sitting and at rest with bed in chair position to recover to 90%. Pt with decreased strength, transfers, cognition and cardiopulmonary function who will benefit from acute therapy to maximize mobility, safety and independence.   Patient will benefit from continued inpatient follow up therapy, <3 hours/day       If plan is discharge home, recommend the following: A lot of help with walking and/or transfers;A lot of help with bathing/dressing/bathroom;Assistance with cooking/housework;Direct supervision/assist for financial management;Assist for transportation;Supervision due to cognitive status   Can travel by private vehicle   No    Equipment Recommendations Wheelchair cushion (measurements PT);Hospital bed;Wheelchair (measurements PT)  Recommendations for Other Services  OT consult    Functional Status Assessment Patient has had a recent decline in their functional status and/or demonstrates limited ability to make significant improvements in function in a reasonable and predictable amount of time     Precautions / Restrictions Precautions Precautions: Fall;Other (comment) Recall of Precautions/Restrictions: Impaired Precaution/Restrictions Comments: watch sats      Mobility  Bed Mobility Overal bed mobility:  Needs Assistance Bed Mobility: Supine to Sit, Sit to Supine     Supine to sit: Min assist, HOB elevated, Used rails Sit to supine: Mod assist   General bed mobility comments: min assist with use of rail and HOB 40 degrees to pivot to rt side of bed. mod assist to lift legs and return to supine. total assist to slide toward [facility] as pt unable to follow commands to scoot EOB    Transfers Overall transfer level: Needs assistance   Transfers: Sit to/from Stand Sit to Stand: Mod assist           General transfer comment: mod assist to stand from EOB x 3 with pt unable to successfully pivot or step with +1 assist, desaturation to 76% on 10L with standing with increased time in sitting to recover to 85% and unable to tolerate further mobility with return to supine    Ambulation/Gait                  Stairs            Wheelchair Mobility     Tilt Bed    Modified Rankin (Stroke Patients Only)       Balance Overall balance assessment: Needs assistance Sitting-balance support: No upper extremity supported, Feet supported Sitting balance-Leahy Scale: Fair     Standing balance support: Bilateral upper extremity supported, During functional activity Standing balance-Leahy Scale: Poor                               Pertinent Vitals/Pain Pain Assessment Pain Assessment: No/denies pain    Home Living Family/patient expects to be discharged to:: Skilled nursing facility  Prior Function Prior Level of Function : Needs assist       Physical Assist : Mobility (physical);ADLs (physical)     Mobility Comments: pt reports weakness in RLE since laminectomy with decreased mobility. Pt may only be transferring to [facility] but difficult to get accurate PLOF as pt inconsistent. Prior admission pt was walking ADLs Comments: sponge bathes per pt     Extremity/Trunk Assessment   Upper Extremity Assessment Upper Extremity Assessment:  Generalized weakness    Lower Extremity Assessment Lower Extremity Assessment: Generalized weakness    Cervical / Trunk Assessment Cervical / Trunk Assessment: Kyphotic  Communication   Communication Communication: Impaired Factors Affecting Communication: Hearing impaired (with hearing aids)    Cognition Arousal: Alert Behavior During Therapy: WFL for tasks assessed/performed   PT - Cognitive impairments: History of cognitive impairments, Safety/Judgement, Problem solving, Memory                         Following commands: Impaired Following commands impaired: Follows one step commands with increased time, Follows one step commands inconsistently     Cueing Cueing Techniques: Verbal cues, Gestural cues     General Comments      Exercises     Assessment/Plan    PT Assessment Patient needs continued PT services  PT Problem List Decreased strength;Decreased activity tolerance;Decreased balance;Decreased mobility;Cardiopulmonary status limiting activity;Decreased safety awareness;Decreased knowledge of use of DME;Decreased cognition       PT Treatment Interventions DME instruction;Gait training;Functional mobility training;Therapeutic activities;Therapeutic exercise;Patient/family education;Cognitive remediation;Neuromuscular re-education;Balance training    PT Goals (Current goals can be found in the Care Plan section)  Acute Rehab PT Goals Patient Stated Goal: return to getting up PT Goal Formulation: With patient Time For Goal Achievement: 11/05/23 Potential to Achieve Goals: Fair    Frequency Min 1X/week     Co-evaluation               AM-PAC PT 6 Clicks Mobility  Outcome Measure Help needed turning from your back to your side while in a flat bed without using bedrails?: A Little Help needed moving from lying on your back to sitting on the side of a flat bed without using bedrails?: A Lot Help needed moving to and from a bed to a chair  (including a wheelchair)?: Total Help needed standing up from a chair using your arms (e.g., wheelchair or bedside chair)?: A Lot Help needed to walk in hospital room?: Total Help needed climbing 3-5 steps with a railing? : Total 6 Click Score: 10    End of Session Equipment Utilized During Treatment: Gait belt Activity Tolerance: Treatment limited secondary to medical complications (Comment) Patient left: in bed;with call bell/phone within reach;with bed alarm set Nurse Communication: Mobility status PT Visit Diagnosis: Other abnormalities of gait and mobility (R26.89);Difficulty in walking, not elsewhere classified (R26.2);Muscle weakness (generalized) (M62.81)    Time: 9091-9071 PT Time Calculation (min) (ACUTE ONLY): 20 min   Charges:   PT Evaluation $PT Eval Moderate Complexity: 1 Mod   PT General Charges $$ ACUTE PT VISIT: 1 Visit         Lenoard SQUIBB, PT Acute Rehabilitation Services Office: [phone]   Lenoard NOVAK Tamzin Bertling 10/22/2023, 10:48 AM

## 2023-10-22 NOTE — Consult Note (Signed)
 Consultation Note Date: 10/22/2023   Patient Name: Megan Pitts  DOB: [birth year]  MRN: [id number]  Age / Sex: 89 y.o., female  PCP: Delores Corean Pollen, MD Referring Physician: Dino Antu, MD  Reason for Consultation: Establishing goals of care  HPI/Patient Profile: 89 y.o. female  with past medical history of hypertension, hyperlipidemia, chronic HFpEF, dementia, hypothyroidism, atrial fibrillation on Eliquis, and continued hypoxic respiratory failure since admission for pneumonia in August 2025  admitted on 10/18/2023 with worsening shortness of breath and increased oxygen requirement  .    PMT has been consulted to assist with goals of care conversation. Patient/Family face treatment option decisions, advanced directive decisions and anticipatory care needs.   Family face treatment option decision, advance directive decisions and anticipatory care needs.   Clinical Assessment and Goals of Care:  I have reviewed medical records including EPIC notes, labs and imaging, assessed the patient and then met with patient at bedside to discuss diagnosis prognosis, GOC, EOL wishes, disposition and options.  I introduced Palliative Medicine as specialized medical care for people living with serious illness. It focuses on providing relief from the symptoms and stress of a serious illness. The goal is to improve quality of life for both the patient and the family.  I visited the patient at bedside today, no family members were present. She was seated in a recliner, appeared comfortable, and was not in any acute distress. She is on high-flow nasal cannula at 12 L/min, with oxygen saturation stable at 97%. The patient was alert, oriented, able to express her needs, and engage in meaningful conversation, though noted to be significantly hard of hearing.  I reviewed with the patient the reason for her current hospitalization and her chronic medical conditions. She  demonstrated good understanding of her overall health status, including her ongoing hypoxic respiratory failure secondary to bacterial pneumonia diagnosed in August 2025, for which she is currently receiving treatment. Based on imaging from 10/11, I shared concerns that she may be developing pulmonary fibrosis, which could be contributing to her persistent shortness of breath.  I explained that her increasing oxygen requirements present a significant challenge in planning for a safe discharge, particularly to a skilled nursing facility. Given her advanced age and high disease burden, weaning from supplemental oxygen may be difficult. The patient verbalized understanding of these concerns.  The patient shared that her health has been declining over the past few months, particularly since May 2025, with worsening functional mobility. She expressed hope for improvement and a desire to regain some independence. When asked about her worries, she stated she has none, as she places her trust in God.  We reviewed her goals of care. She is currently DNR-limited and does not wish to undergo heroic measures if her condition worsens. She confirmed having documented advance directives and identified her daughter as her healthcare power of attorney. She expressed that if her condition continues to decline and quality of life cannot be maintained, she would prefer comfort-focused care.  Created space and opportunity for family to explore thoughts and feelings regarding patient's current medical condition.   The difference between aggressive medical intervention and comfort care was considered in light of the patient's goals of care. Hospice and Palliative Care services outpatient were explained and offered.   Discussed the importance of continued conversation with family and the medical providers regarding overall plan of care and treatment options, ensuring decisions are within the context of the patient's values and  GOCs.   Questions and  concerns were addressed.  Hard Choices booklet left for review. The family was encouraged to call with questions or concerns.  PMT will continue to support holistically.   Social History: She shared that she has two daughters and is a retired Engineer, site, having worked for the health department for 26 years. She was married for 28 years until her husband passed away 13 years ago. She describes herself as a people person who enjoys socializing and attending church.  Functional and Nutritional State: Functionally, she is non-ambulatory and relies on a wheelchair for mobility. Her appetite has been poor. She was previously at [facility] for rehabilitation but came to the hospital due to worsening symptoms. She noted that she has not walked since May 2025. Her appetite has been suboptimal.   Palliative Symptoms: Shortness of breath with exertion, generalized weakness  Advance Directives: We discussed advance directive in detail. She mentioned that she has existing advance directive documents and that her daughter Odetta Shuck is her HCPOA.   Code Status: DNR-Limited  Primary Decision Maker: Patient   SUMMARY OF RECOMMENDATIONS    Recommendations/Plan:  Code Status: Maintain DNR-Limited Continue current scope of care Goal of care is medical stabilization and recovery to the extent this is possible Continue with full scope of care per patient/family's wish/desire Spiritual care consult Continue to provide psycho-social and emotional support to patient and family Palliative medicine team will continue to follow.   Symptom Management: Per Primary team Palliative medicine is available to assist as needed.    Palliative Prophylaxis:  Aspiration, Delirium Protocol, Frequent Pain Assessment, Oral Care, Palliative Wound Care, and Turn Reposition   Psycho-social/Spiritual:  Desire for further Chaplaincy support:yes  Prognosis:  Prognosis is guarded due to  advance age, acute on chronic ongoing respiratory failure, functional decline and chronic co morbidities.   Discharge Planning: To Be Determined      Primary Diagnoses: Present on Admission:  Acute respiratory failure with hypoxia (HCC)  Multifocal pneumonia  Hypothyroidism  CKD stage 3a, GFR 45-59 ml/min (HCC)  Paroxysmal atrial fibrillation (HCC)  Chronic diastolic CHF (congestive heart failure) (HCC)    Physical Exam Vitals and nursing note reviewed.  HENT:     Head:     Comments: Hard of hearing.  Cardiovascular:     Rate and Rhythm: Normal rate.  Pulmonary:     Comments: Shortness of breath on exertion Musculoskeletal:     Comments: Generalized weakness  Neurological:     General: No focal deficit present.     Mental Status: She is alert.  Psychiatric:        Mood and Affect: Mood normal.     Vital Signs: BP (!) 157/53 (BP Location: Left Arm)   Pulse 67   Temp 97.8 F (36.6 C) (Axillary)   Resp 20   Ht 5' 3 (1.6 m)   Wt 74.8 kg   SpO2 97%   BMI 29.21 kg/m  Pain Scale: 0-10   Pain Score: 0-No pain   SpO2: SpO2: 97 % O2 Device:SpO2: 97 % O2 Flow Rate: .O2 Flow Rate (L/min): 12 L/min   Palliative Assessment/Data: 40 to 50%    Total time: I spent 90 minutes in the care of the patient today in the above activities and documenting the encounter.   Detailed review of medical records (labs, imaging, vital signs), medically appropriate exam, discussed with treatment team, counseling and education to patient, family, & staff, documenting clinical information, coordination of care.     Kathlyne JULIANNA Tracie Mickey,  NP  Palliative Medicine Team Team phone # [phone]  Thank you for allowing the Palliative Medicine Team to assist in the care of this patient. Please utilize secure chat with additional questions, if there is no response within 30 minutes please call the above phone number.  Palliative Medicine Team providers are available by phone from 7am  to 7pm daily and can be reached through the team cell phone.  Should this patient require assistance outside of these hours, please call the patient's attending physician.

## 2023-10-22 NOTE — Evaluation (Signed)
 Occupational Therapy Evaluation Patient Details Name: Megan Pitts MRN: [id number] DOB: Dec 24, 1927 Today's Date: 10/22/2023   History of Present Illness   89 y/o F adm from [facility] 10/18/23 with SOB, respiratory failure. PMHx: Afib, AVR, GERD, HLD, HTN, shingles, HFpEF, vertigo, dementia, L2-3 lami     Clinical Impressions Spoke with daughter Megan Pitts) via phone for PLOF. Pt was living alone independently in June of 2025. Daughter states [name] was receiving rehab at [facility] and was transfering to the wc using a squat pivot transfer and had not walked since May. Staff assists with ADL tasks, however [name] does what she can. Plan is to DC to [facility] and she would like to continue with rehab. Pt able to progress OOB to recliner with Max A using squat pivot transfer toward stronger L side. 3/4 DOE with desat into the high 70s on 10L with increased time for recovery to high 80s using pursed lip breathing. Pt very pleasant and motivated to do more for herself. Patient will benefit from continued inpatient follow up therapy, <3 hours/day to maximize functional level of  independence. Acute OT to follow.  Recommend Palliative care consult.     If plan is discharge home, recommend the following:   Two people to help with walking and/or transfers;A lot of help with bathing/dressing/bathroom     Functional Status Assessment   Patient has had a recent decline in their functional status and demonstrates the ability to make significant improvements in function in a reasonable and predictable amount of time.     Equipment Recommendations   None recommended by OT     Recommendations for Other Services         Precautions/Restrictions   Precautions Precautions: Fall;Other (comment) Recall of Precautions/Restrictions: Impaired Precaution/Restrictions Comments: watch sats Restrictions Weight Bearing Restrictions Per Provider Order: No     Mobility Bed Mobility Overal  bed mobility: Needs Assistance Bed Mobility: Supine to Sit, Sit to Supine     Supine to sit: Min assist, HOB elevated, Used rails          Transfers Overall transfer level: Needs assistance   Transfers: Bed to chair/wheelchair/BSC Sit to Stand: Max assist                  Balance Overall balance assessment: Needs assistance Sitting-balance support: No upper extremity supported, Feet supported Sitting balance-Leahy Scale: Fair                                     ADL either performed or assessed with clinical judgement   ADL Overall ADL's : Needs assistance/impaired Eating/Feeding: Modified independent   Grooming: Set up   Upper Body Bathing: Minimal assistance;Bed level   Lower Body Bathing: Moderate assistance;Bed level   Upper Body Dressing : Set up;Sitting   Lower Body Dressing: Maximal assistance;Bed level   Toilet Transfer: Maximal assistance;Squat-pivot   Toileting- Clothing Manipulation and Hygiene: Maximal assistance       Functional mobility during ADLs: Maximal assistance       Vision Baseline Vision/History: 1 Wears glasses       Perception         Praxis         Pertinent Vitals/Pain Pain Assessment Pain Assessment: No/denies pain     Extremity/Trunk Assessment Upper Extremity Assessment Upper Extremity Assessment: Generalized weakness (but functional)   Lower Extremity Assessment Lower Extremity Assessment: Generalized  weakness;RLE deficits/detail RLE Deficits / Details: weaker than L since back surgery   Cervical / Trunk Assessment Cervical / Trunk Assessment: Kyphotic   Communication Communication Communication: Impaired Factors Affecting Communication: Hearing impaired (with hearing aids)   Cognition Arousal: Alert Behavior During Therapy: WFL for tasks assessed/performed Cognition: History of cognitive impairments (most likely at baseline)                               Following  commands: Intact       Cueing  General Comments   Cueing Techniques: Verbal cues;Gestural cues      Exercises Exercises: Other exercises Other Exercises Other Exercises: incentive spirometer x 8 - able to pull @ 400 ml   Shoulder Instructions      Home Living Family/patient expects to be discharged to:: Skilled nursing facility                                        Prior Functioning/Environment Prior Level of Function : Needs assist       Physical Assist : Mobility (physical);ADLs (physical) Mobility (physical): Transfers ADLs (physical): Bathing;Dressing;Toileting Mobility Comments: pt reports weakness in RLE since laminectomy with decreased mobility. Pt able to complete squat pivot transfers to wc with assistance from staff; has not walked since May 2025 ADLs Comments: sponge bathes as much as she can; feeds self, does her grooming    OT Problem List: Decreased strength;Decreased activity tolerance;Cardiopulmonary status limiting activity   OT Treatment/Interventions: Self-care/ADL training;Therapeutic exercise;Energy conservation;DME and/or AE instruction;Therapeutic activities;Patient/family education      OT Goals(Current goals can be found in the care plan section)   Acute Rehab OT Goals Patient Stated Goal: do more for herself; would love to walk OT Goal Formulation: With patient/family Time For Goal Achievement: 11/05/23 Potential to Achieve Goals: Fair   OT Frequency:  Min 2X/week    Co-evaluation              AM-PAC OT 6 Clicks Daily Activity     Outcome Measure Help from another person eating meals?: None Help from another person taking care of personal grooming?: A Little Help from another person toileting, which includes using toliet, bedpan, or urinal?: A Lot Help from another person bathing (including washing, rinsing, drying)?: A Lot Help from another person to put on and taking off regular upper body clothing?: A  Little Help from another person to put on and taking off regular lower body clothing?: A Lot 6 Click Score: 16   End of Session Equipment Utilized During Treatment: Gait belt;Oxygen (10L hfnc) Nurse Communication: Mobility status;Other (comment) (mobility specialist to assist)  Activity Tolerance: Treatment limited secondary to medical complications (Comment) (desats with activity) Patient left: in chair;with call bell/phone within reach;with chair alarm set  OT Visit Diagnosis: Other abnormalities of gait and mobility (R26.89);Muscle weakness (generalized) (M62.81)                Time: 1210-1250 OT Time Calculation (min): 40 min Charges:  OT General Charges $OT Visit: 1 Visit OT Evaluation $OT Eval Moderate Complexity: 1 Mod OT Treatments $Self Care/Home Management : 8-22 mins $Therapeutic Activity: 8-22 mins  Kreg Sink, OT/L   Acute OT Clinical Specialist Acute Rehabilitation Services Pager [phone] Office [phone]   [facility] 10/22/2023, 1:23 PM

## 2023-10-22 NOTE — TOC Initial Note (Addendum)
 Transition of Care [facility]) - Initial/Assessment Note    Patient Details  Name: Megan Pitts MRN: [id number] Date of Birth: 1927/12/16  Transition of Care [facility] CM/SW Contact:    Luise JAYSON Pan, LCSWA Phone Number: 10/22/2023, 10:49 AM  Clinical Narrative:  Per chart review, patient is from home alone. Patient recently went to [facility] for rehab from 9/29 - 10/9. Palliative has been consulted.   11:11 AM CSW spoke with Odetta, patients daughter, at patients request about plans for discharge. Per Patty, patient was suppose to go to [facility] today from [place] but patient got admitted to the hospital. Odetta stated CSW should contact Nicki at [facility]. CSW informed Patty that Palliative has been consulted.  11:15 AM CSW spoke with Nicki at [facility]. Per Nicki, patient can discharge to facility when medically stable. CSW inquired about patients current O2 needs. Nicki stated patient will need to be weened to 5L O2 or less before discharging to SNF.   ICM (inpatient care management) will continue to follow for discharge planning needs.     Expected Discharge Plan:  (TBD) Barriers to Discharge: Continued Medical Work up   Patient Goals and CMS Choice Patient states their goals for this hospitalization and ongoing recovery are:: Unable to assess          Expected Discharge Plan and Services In-house Referral: Clinical Social Work, Hospice / Palliative Care Discharge Planning Services: CM Consult   Living arrangements for the past 2 months: Single Family Home                                      Prior Living Arrangements/Services Living arrangements for the past 2 months: Single Family Home Lives with:: Self   Do you feel safe going back to the place where you live?: Yes      Need for Family Participation in Patient Care: Yes (Comment) Care giver support system in place?: Yes (comment) Current home services: DME (none) Criminal Activity/Legal  Involvement Pertinent to Current Situation/Hospitalization: No - Comment as needed  Activities of Daily Living   ADL Screening (condition at time of admission) Independently performs ADLs?: No Does the patient have a NEW difficulty with bathing/dressing/toileting/self-feeding that is expected to last >3 days?: No Does the patient have a NEW difficulty with getting in/out of bed, walking, or climbing stairs that is expected to last >3 days?: No Does the patient have a NEW difficulty with communication that is expected to last >3 days?: No Is the patient deaf or have difficulty hearing?: Yes Does the patient have difficulty seeing, even when wearing glasses/contacts?: No Does the patient have difficulty concentrating, remembering, or making decisions?: No  Permission Sought/Granted Permission sought to share information with : Family Supports Permission granted to share information with : Yes, Verbal Permission Granted  Share Information with NAME: [name],Patty     Permission granted to share info w Relationship: Daughter  Permission granted to share info w Contact Information: [phone]  Emotional Assessment Appearance:: Appears stated age Attitude/Demeanor/Rapport: Unable to Assess Affect (typically observed): Unable to Assess Orientation: : Oriented to Self, Oriented to Place, Oriented to  Time, Oriented to Situation Alcohol  / Substance Use: Not Applicable Psych Involvement: No (comment)  Admission diagnosis:  Acute respiratory failure with hypoxia (HCC) [J96.01] Multifocal pneumonia [J18.8] Acute on chronic hypoxic respiratory failure (HCC) [J96.21] Patient Active Problem List   Diagnosis Date Noted  Osteoporosis, postmenopausal 09/18/2023   Acute respiratory failure with hypoxia (HCC) 09/03/2023   Multifocal pneumonia 09/03/2023   Hyponatremia 09/03/2023   Paroxysmal atrial fibrillation (HCC)    HNP (herniated nucleus pulposus), lumbar 06/16/2023   Lumbar disc  herniation 06/11/2023   Hypothyroidism 06/11/2023   Thrombocytosis 06/11/2023   Memory impairment 06/11/2023   CKD stage 3a, GFR 45-59 ml/min (HCC) 06/11/2023   Closed right ankle fracture 09/10/2020   CKD (chronic kidney disease), stage II 09/10/2020   Cervical spine fracture (HCC) 06/03/2019   Syncope, vasovagal 09/27/2017   Syncope and collapse 09/26/2017   Fracture dislocation of right shoulder joint 09/24/2017   Shoulder fracture, right 09/24/2017   Chronic diastolic CHF (congestive heart failure) (HCC) 09/24/2017   Leucocytosis 09/24/2017   Cellulitis 05/05/2015   S/P AVR 10/26/2010   Aortic stenosis    Essential hypertension    PVC's (premature ventricular contractions)    Hyperlipidemia    PCP:  Delores Corean Pollen, MD Pharmacy:   [facility] [place] [place] - [address] [place] [address] Phone: [phone] Fax: [phone]     Social Drivers of Health (SDOH) Social History: SDOH Screenings   Food Insecurity: No Food Insecurity (09/07/2023)   Received from [facility]  Housing: Patient Declined (10/19/2023)  Transportation Needs: Patient Declined (10/19/2023)  Recent Concern: Transportation Needs - Unmet Transportation Needs (09/07/2023)   Received from [facility]  Utilities: Patient Declined (10/19/2023)  Alcohol  Screen: Low Risk  (06/30/2019)  Depression (PHQ2-9): Low Risk  (12/08/2020)  Financial Resource Strain: Low Risk  (09/07/2023)   Received from [facility]  Physical Activity: Inactive (06/30/2019)  Social Connections: Patient Declined (10/19/2023)  Recent Concern: Social Connections - Moderately Isolated (09/07/2023)   Received from [facility]  Stress: No Stress Concern Present (10/08/2023)   Received from [facility]  Tobacco Use: Low Risk  (10/18/2023)   SDOH Interventions:     Readmission Risk Interventions    09/04/2023    3:03 PM  Readmission Risk Prevention  Plan  Transportation Screening Complete  PCP or Specialist Appt within 5-7 Days Complete  Home Care Screening Complete  Medication Review (RN CM) Complete

## 2023-10-23 DIAGNOSIS — J9601 Acute respiratory failure with hypoxia: Secondary | ICD-10-CM | POA: Diagnosis not present

## 2023-10-23 DIAGNOSIS — Z515 Encounter for palliative care: Secondary | ICD-10-CM | POA: Diagnosis not present

## 2023-10-23 DIAGNOSIS — J9621 Acute and chronic respiratory failure with hypoxia: Secondary | ICD-10-CM | POA: Diagnosis not present

## 2023-10-23 DIAGNOSIS — J188 Other pneumonia, unspecified organism: Secondary | ICD-10-CM | POA: Diagnosis not present

## 2023-10-23 LAB — CULTURE, BLOOD (ROUTINE X 2)
Culture: NO GROWTH
Culture: NO GROWTH

## 2023-10-23 MED ORDER — MELATONIN 3 MG PO TABS
6.0000 mg | ORAL_TABLET | Freq: Every evening | ORAL | Status: DC | PRN
  Administered 2023-10-23 – 2023-10-27 (×5): 6 mg via ORAL
  Filled 2023-10-23 (×5): qty 2

## 2023-10-23 NOTE — Progress Notes (Signed)
  Progress Note   Patient: Megan Pitts FMW:[id number] DOB: 08-17-1927 DOA: 10/18/2023     5 DOS: the patient was seen and examined on 10/23/2023   Brief hospital course:   89 y.o. female with medical history significant for hypertension, hyperlipidemia, chronic HFpEF, dementia, hypothyroidism, atrial fibrillation on Eliquis, and continued hypoxic respiratory failure since admission for pneumonia in August 2025, now presenting from her SNF for worsening shortness of breath and increased oxygen requirement. She recently completed a 7-day course of antibiotics for pneumonia. Admitted for treatment of PNA, AHRF and antibiotics have been initiated. CT showed pulm fibrosis and she is needing high flow oxygen. Palliative consulted.  Assessment and Plan:  Acute hypoxic respiratory failure, likely in the setting of possible bacterial pneumonia as well as Pulmonary fibrosis,POA: Improving Continue with cefepime and azithromycin . She did receive a dose of IV Zyvox -Tested negative for COVID,RSV and Influenza -I reviewed her CT chest which was done on 10/11 and it showed evidence of acute on chronic disease, likely developing pulmonary fibrosis. -Increased dose of prednisone to 40 mg daily on 10/12. -NGTD on blood cultures - Because of the DNR status, advanced age and underlying pulmonary fibrosis with need for persistently high amounts of oxygen, palliative services has been consulted. -She was needing 10 L HFNC. Now, she is on 7 L. Goal oxygen saturation of 88-92%.   Chronic HFpEF,POA:  - Appears compensated  -ECHO done in August 2025 showed normal EF but Grade II diastolic dysfunction. - Continue Lasix  40 mg Po daily, monitor volume status. Close monitoring of renal panel while on lasix .   PAF  - Continue diltiazem, metoprolol, and Eliquis    CKD 3, ruled out - Baseline creatinine is within normal limits.   Hypothyroidism  - continue Synthroid      Dementia  - Continue Aricept , use delirium  precautions    Disposition: [facility]      Subjective:No acute issues overnight. We spoke about her down trending oxygen requirements.   Vitals:   10/23/23 0435 10/23/23 0734 10/23/23 0800 10/23/23 0902  BP:  (!) 163/54    Pulse: (!) 56 67 70 72  Resp: 19 19 15 20   Temp:  98 F (36.7 C)    TempSrc:  Oral    SpO2: 100% 100% 98% 96%  Weight: 73.6 kg     Height:       Constitutional: in no distress, High flow nasal oxygen in place at 7 L, extremely hard of hearing Eyes: PERRL, lids and conjunctivae normal ENMT: Mucous membranes are moist. Posterior pharynx clear of any exudate or lesions.Normal dentition.  Neck: normal, supple, no masses, no thyromegaly Respiratory: Coarse breath sounds bilaterally Cardiovascular: Regular rate and rhythm, no murmurs / rubs / gallops. No extremity edema. 2+ pedal pulses. No carotid bruits.  Abdomen: no tenderness, no masses palpated. No hepatosplenomegaly. Bowel sounds positive.  Musculoskeletal: no clubbing / cyanosis. No joint deformity upper and lower extremities. Good ROM, no contractures. Normal muscle tone.  Skin: no rashes, lesions, ulcers. No induration Neurologic: CN 2-12 grossly intact. Sensation intact, DTR normal. Strength 5/5 x all 4 extremities.   Data Reviewed:  There are no new results to review at this time.  Family Communication: None at the bedside  Disposition: Status is: Inpatient Remains inpatient appropriate because: AHRF, PNA  Planned Discharge Destination: Skilled nursing facility    Time spent: 41 minutes  Author: Deliliah Room, MD 10/23/2023 10:45 AM  For on call review [email or web address].

## 2023-10-23 NOTE — Progress Notes (Signed)
 Palliative Medicine Inpatient Follow Up Note   HPI: 89 y.o. female  with past medical history of hypertension, hyperlipidemia, chronic HFpEF, dementia, hypothyroidism, atrial fibrillation on Eliquis, and continued hypoxic respiratory failure since admission for pneumonia in August 2025  admitted on 10/18/2023 with worsening shortness of breath and increased oxygen requirement  .      PMT has been consulted to assist with goals of care conversation. Patient/Family face treatment option decisions, advanced directive decisions and anticipatory care needs.    Family face treatment option decision, advance directive decisions and anticipatory care needs.     Today's Discussion 10/23/2023  *Please note that this is a verbal dictation therefore any spelling or grammatical errors are due to the Dragon Medical One system interpretation.  Chart reviewed inclusive of vital signs, progress notes, laboratory results, and diagnostic images.   I visited the patient at bedside today. Present were her daughter, Odetta Shuck, and son-in-law, Oneil. The patient was lying comfortably in bed on high-flow nasal cannula, currently at 7 L/min, down from 10 L/min yesterday. She appeared comfortable and in no acute distress. She was alert and oriented to person, place, time, and situation, though noted to be significantly hard of hearing. She denied any pain, chest discomfort, or other complaints. She reported that her breathing is manageable at rest but becomes labored with exertion or movement.  I met with Patty and Oneil to discuss the patient's goals of care. We reviewed her advanced age and multiple comorbidities, including acute hypoxic respiratory failure and a new diagnosis of pulmonary fibrosis confirmed by imaging. These, along with her chronic conditions, congestive heart failure and atrial fibrillation, make recovery particularly challenging. We acknowledged that the clinical course may involve both progress  and setbacks but generally progressive.   During our conversation, the patient and her daughter emphasized that quality of life is a top priority. We reviewed her living will and advance directive, which clearly state her preference for no aggressive interventions in the setting of terminal or irreversible illness.  Patty inquired about treatment options, particularly comfort-focused care and hospice. I provided detailed information about hospice philosophy and services, explaining that this may be a reasonable option should her condition continue to decline or if we are unable to meet her oxygen requirements enough for her to remain comfortable. I also discussed the progressive nature of pulmonary fibrosis and the likelihood of increasing oxygen needs over time.  Regarding discharge planning, I explained that if the patient is to be transferred to a skilled nursing facility such as [facility], her oxygen requirement must decrease to at least 5 L/min. If her oxygen needs remain high, she may not be a candidate for SNF placement unless the care plan transitions to comfort-focused care, where the emphasis shifts from oxygen targets to symptom relief, dignity, and quality of life during the terminal phase.  Based on our discussion, the family plans to take things one day at a time and remain hopeful for clinical improvement. They are open to transitioning to hospice care if the patient's condition worsens or fails to improve. They are currently considering both home hospice and custodial hospice options but have not yet made a decision. For now, the focus remains on supportive care and monitoring for improvement.  Daughter provided us  with a copy of patient's living will and healthcare power of attorney documents.   Created space and opportunity for patient to explore thoughts feelings and fears regarding current medical situation.  Patient and her family face  treatment option decisions, advanced  directive decisions and anticipatory care needs.   Questions and concerns addressed   Palliative Support Provided.   Objective Assessment: Vital Signs Vitals:   10/23/23 0800 10/23/23 0902  BP:    Pulse: 70 72  Resp: 15 20  Temp:    SpO2: 98% 96%    Intake/Output Summary (Last 24 hours) at 10/23/2023 1100 Last data filed at 10/23/2023 0906 Gross per 24 hour  Intake 913 ml  Output 600 ml  Net 313 ml   Last Weight  Most recent update: 10/23/2023  4:36 AM    Weight  73.6 kg (162 lb 4.1 oz)             Physical Exam Vitals and nursing note reviewed.  HENT:     Head:     Comments: Hard of hearing.  Cardiovascular:     Rate and Rhythm: Normal rate.  Pulmonary:     Comments: Shortness of breath on exertion Musculoskeletal:     Comments: Generalized weakness  Neurological:     General: No focal deficit present.     Mental Status: She is alert.  Psychiatric:        Mood and Affect: Mood normal.   SUMMARY OF RECOMMENDATIONS     Code Status: Maintain DNR-Limited Continue current scope of care to allow time for outcomes If patient worsens or continue to decline, patient/family open to transitioning to comfort-focused care.  Continue with full scope of care per patient/family's wish/desire Spiritual care consult (placed 10/22/2023) Continue to provide psycho-social and emotional support to patient and family Palliative medicine team will continue to follow.    Symptom Management: Per Primary team Palliative medicine is available to assist as needed.    Time Spent: 50 minutes  Detailed review of medical records (labs, imaging, vital signs), medically appropriate exam, discussed with treatment team, counseling and education to patient, family, & staff, documenting clinical information, coordination of care.  ________________________________________________________________________ Kathlyne Bolder NP-C [facility] Palliative Medicine Team Team Cell Phone:  [phone] Please utilize secure chat with additional questions, if there is no response within 30 minutes please call the above phone number  Palliative Medicine Team providers are available by phone from 7am to 7pm daily and can be reached through the team cell phone.  Should this patient require assistance outside of these hours, please call the patient's attending physician.

## 2023-10-23 NOTE — Progress Notes (Signed)
 This chaplain responded to PMT NP-Erwin's consult for Pt. prayer. The Pt. is awake, accepting of a visit. The Pt. is hopeful to return to  generously serving community as she was in May.    The chaplain understands from reflective listening the Pt. is grieving the loss of meaning(baking cakes and visiting with friends) in her life. The Pt. is leaning on prayer for answers

## 2023-10-24 DIAGNOSIS — J9621 Acute and chronic respiratory failure with hypoxia: Secondary | ICD-10-CM | POA: Diagnosis not present

## 2023-10-24 DIAGNOSIS — I10 Essential (primary) hypertension: Secondary | ICD-10-CM | POA: Diagnosis not present

## 2023-10-24 DIAGNOSIS — J188 Other pneumonia, unspecified organism: Secondary | ICD-10-CM | POA: Diagnosis not present

## 2023-10-24 DIAGNOSIS — J9601 Acute respiratory failure with hypoxia: Secondary | ICD-10-CM | POA: Diagnosis not present

## 2023-10-24 DIAGNOSIS — I480 Paroxysmal atrial fibrillation: Secondary | ICD-10-CM | POA: Diagnosis not present

## 2023-10-24 DIAGNOSIS — I5032 Chronic diastolic (congestive) heart failure: Secondary | ICD-10-CM | POA: Diagnosis not present

## 2023-10-24 DIAGNOSIS — Z515 Encounter for palliative care: Secondary | ICD-10-CM | POA: Diagnosis not present

## 2023-10-24 LAB — BASIC METABOLIC PANEL WITH GFR
Anion gap: 16 — ABNORMAL HIGH (ref 5–15)
BUN: 27 mg/dL — ABNORMAL HIGH (ref 8–23)
CO2: 24 mmol/L (ref 22–32)
Calcium: 8.8 mg/dL — ABNORMAL LOW (ref 8.9–10.3)
Chloride: 98 mmol/L (ref 98–111)
Creatinine, Ser: 1.15 mg/dL — ABNORMAL HIGH (ref 0.44–1.00)
GFR, Estimated: 44 mL/min — ABNORMAL LOW (ref >60)
Glucose, Bld: 271 mg/dL — ABNORMAL HIGH (ref 70–99)
Potassium: 3.9 mmol/L (ref 3.5–5.1)
Sodium: 138 mmol/L (ref 135–145)

## 2023-10-24 MED ORDER — AZITHROMYCIN 250 MG PO TABS
500.0000 mg | ORAL_TABLET | Freq: Every day | ORAL | Status: DC
  Administered 2023-10-24 – 2023-10-28 (×5): 500 mg via ORAL
  Filled 2023-10-24 (×5): qty 2

## 2023-10-24 MED ORDER — SODIUM CHLORIDE 0.9 % IV SOLN: 1.0000 g | INTRAVENOUS | Status: DC

## 2023-10-24 MED ORDER — SODIUM CHLORIDE 0.9 % IV SOLN: 2.0000 g | INTRAVENOUS | Status: DC

## 2023-10-24 MED ORDER — BUDESONIDE 0.25 MG/2ML IN SUSP
0.2500 mg | Freq: Two times a day (BID) | RESPIRATORY_TRACT | Status: DC
  Administered 2023-10-24 – 2023-10-28 (×8): 0.25 mg via RESPIRATORY_TRACT
  Filled 2023-10-24 (×8): qty 2

## 2023-10-24 MED ORDER — METHYLPREDNISOLONE SODIUM SUCC 125 MG IJ SOLR
60.0000 mg | Freq: Two times a day (BID) | INTRAMUSCULAR | Status: DC
  Administered 2023-10-24 – 2023-10-26 (×4): 60 mg via INTRAVENOUS
  Filled 2023-10-24 (×4): qty 2

## 2023-10-24 MED ORDER — ALBUTEROL SULFATE (2.5 MG/3ML) 0.083% IN NEBU
2.5000 mg | INHALATION_SOLUTION | RESPIRATORY_TRACT | Status: DC | PRN
  Administered 2023-10-27: 2.5 mg via RESPIRATORY_TRACT
  Filled 2023-10-24: qty 3

## 2023-10-24 MED ORDER — SODIUM CHLORIDE 0.9 % IV SOLN
2.0000 g | INTRAVENOUS | Status: DC
  Administered 2023-10-25 – 2023-10-27 (×3): 2 g via INTRAVENOUS
  Filled 2023-10-24 (×3): qty 20

## 2023-10-24 MED ORDER — IPRATROPIUM-ALBUTEROL 0.5-2.5 (3) MG/3ML IN SOLN
3.0000 mL | Freq: Four times a day (QID) | RESPIRATORY_TRACT | Status: DC
  Administered 2023-10-24: 3 mL via RESPIRATORY_TRACT
  Filled 2023-10-24 (×3): qty 3

## 2023-10-24 NOTE — Progress Notes (Signed)
 Notified to the MD regarding increased oxygen requirement. Patient states shortness of breath, and seems tacypneic during rest, daughter was at bedside, updates provided, will continue to monitor.     10/24/23 1054  Vitals  Temp 97.6 F (36.4 C)  Temp Source Oral  BP (!) 157/39  MAP (mmHg) 74  BP Location Left Arm  BP Method Automatic  Patient Position (if appropriate) Lying  Pulse Rate 70  Pulse Rate Source Monitor  ECG Heart Rate 78  Resp (!) 28  MEWS COLOR  MEWS Score Color Yellow  Oxygen Therapy  SpO2 90 %  O2 Device HFNC  O2 Flow Rate (L/min) 9 L/min  Patient Activity (if Appropriate) In bed  Pulse Oximetry Type Continuous  MEWS Score  MEWS Temp 0  MEWS Systolic 0  MEWS Pulse 0  MEWS RR 2  MEWS LOC 0  MEWS Score 2  Provider Notification  Provider Name/Title Arrien Elidia BIRCH, MD  Date Provider Notified 10/24/23  Time Provider Notified 1253  Method of Notification Page  Notification Reason Change in status  Provider response No new orders

## 2023-10-24 NOTE — NC FL2 (Signed)
 [place]  MEDICAID [id number] LEVEL OF CARE FORM     IDENTIFICATION  Patient Name: Megan Pitts Birthdate: [birth year] Sex: female Admission Date (Current Location): 10/18/2023  [facility] and [place] Number:  Producer, television/film/video and Address:  [facility], [address] [place], [place] [address]      Provider Number: [id number]  Attending Physician Name and Address:  Noralee Elidia Sieving,*  Relative Name and Phone Number:  [place] (Daughter)  [phone]    Current Level of Care: Hospital Recommended Level of Care: Skilled Nursing Facility Prior Approval Number:    Date Approved/Denied:   PASRR Number: [id number]  Discharge Plan: SNF    Current Diagnoses: Patient Active Problem List   Diagnosis Date Noted   Osteoporosis, postmenopausal 09/18/2023   Acute respiratory failure with hypoxia (HCC) 09/03/2023   Multifocal pneumonia 09/03/2023   Hyponatremia 09/03/2023   Paroxysmal atrial fibrillation (HCC)    HNP (herniated nucleus pulposus), lumbar 06/16/2023   Lumbar disc herniation 06/11/2023   Hypothyroidism 06/11/2023   Thrombocytosis 06/11/2023   Memory impairment 06/11/2023   CKD stage 3a, GFR 45-59 ml/min (HCC) 06/11/2023   Closed right ankle fracture 09/10/2020   CKD (chronic kidney disease), stage II 09/10/2020   Cervical spine fracture (HCC) 06/03/2019   Syncope, vasovagal 09/27/2017   Syncope and collapse 09/26/2017   Fracture dislocation of right shoulder joint 09/24/2017   Shoulder fracture, right 09/24/2017   Chronic diastolic CHF (congestive heart failure) (HCC) 09/24/2017   Leucocytosis 09/24/2017   Cellulitis 05/05/2015   S/P AVR 10/26/2010   Aortic stenosis    Essential hypertension    PVC's (premature ventricular contractions)    Hyperlipidemia     Orientation RESPIRATION BLADDER Height & Weight     Self, Time, Situation, Place  O2 (6L O2 (weening)) Incontinent, External catheter Weight: 156 lb 1.4 oz (70.8  kg) Height:  5' 3 (160 cm)  BEHAVIORAL SYMPTOMS/MOOD NEUROLOGICAL BOWEL NUTRITION STATUS      Continent Diet (Please see discharge summary)  AMBULATORY STATUS COMMUNICATION OF NEEDS Skin   Extensive Assist Verbally                         Personal Care Assistance Level of Assistance  Bathing, Feeding, Dressing Bathing Assistance: Maximum assistance Feeding assistance: Limited assistance Dressing Assistance: Maximum assistance     Functional Limitations Info  Sight, Speech, Hearing Sight Info: Adequate Hearing Info: Impaired Speech Info: Adequate    SPECIAL CARE FACTORS FREQUENCY  PT (By licensed PT), OT (By licensed OT)     PT Frequency: 5x week OT Frequency: 5x week            Contractures Contractures Info: Not present    Additional Factors Info  Code Status, Allergies Code Status Info: DNR limited Allergies Info: Levothyroxine , Tramadol           Current Medications (10/24/2023):  This is the current hospital active medication list Current Facility-Administered Medications  Medication Dose Route Frequency Provider Last Rate Last Admin   acetaminophen  (TYLENOL ) tablet 650 mg  650 mg Oral Q6H PRN Opyd, Timothy S, MD   650 mg at 10/23/23 2238   Or   acetaminophen  (TYLENOL ) suppository 650 mg  650 mg Rectal Q6H PRN Opyd, Evalene RAMAN, MD       amLODipine  (NORVASC ) tablet 10 mg  10 mg Oral Daily Opyd, Timothy S, MD   10 mg at 10/24/23 0959   apixaban (ELIQUIS) tablet 5 mg  5 mg Oral BID Opyd, Timothy S, MD   5 mg at 10/24/23 9040   aspirin  EC tablet 81 mg  81 mg Oral Daily Opyd, Timothy S, MD   81 mg at 10/24/23 9040   cefTRIAXone  (ROCEPHIN ) 2 g in sodium chloride  0.9 % 100 mL IVPB  2 g Intravenous Q24H Rashid, Farhan, MD 200 mL/hr at 10/23/23 1705 2 g at 10/23/23 1705   diltiazem (CARDIZEM CD) 24 hr capsule 120 mg  120 mg Oral Daily Opyd, Timothy S, MD   120 mg at 10/24/23 9040   donepezil  (ARICEPT ) tablet 10 mg  10 mg Oral QHS Opyd, Timothy S, MD   10 mg at  10/23/23 2126   furosemide  (LASIX ) tablet 40 mg  40 mg Oral Daily Opyd, Timothy S, MD   40 mg at 10/24/23 9040   guaiFENesin -dextromethorphan  (ROBITUSSIN DM) 100-10 MG/5ML syrup 5 mL  5 mL Oral Q4H PRN Opyd, Timothy S, MD       irbesartan  (AVAPRO ) tablet 300 mg  300 mg Oral Daily Opyd, Timothy S, MD   300 mg at 10/24/23 9040   levothyroxine  (SYNTHROID ) tablet 50 mcg  50 mcg Oral Daily Opyd, Timothy S, MD   50 mcg at 10/24/23 0544   melatonin tablet 6 mg  6 mg Oral QHS PRN Segars, Jonathan, MD   6 mg at 10/23/23 2310   metoprolol tartrate (LOPRESSOR) tablet 50 mg  50 mg Oral BID Opyd, Timothy S, MD   50 mg at 10/24/23 9040   ondansetron  (ZOFRAN ) tablet 4 mg  4 mg Oral Q6H PRN Opyd, Timothy S, MD       Or   ondansetron  (ZOFRAN ) injection 4 mg  4 mg Intravenous Q6H PRN Opyd, Evalene RAMAN, MD       predniSONE (DELTASONE) tablet 40 mg  40 mg Oral Q breakfast Rashid, Farhan, MD   40 mg at 10/24/23 9040   senna-docusate (Senokot-S) tablet 1 tablet  1 tablet Oral QHS PRN Opyd, Timothy S, MD       sodium chloride  flush (NS) 0.9 % injection 10-40 mL  10-40 mL Intracatheter PRN Opyd, Timothy S, MD       sodium chloride  flush (NS) 0.9 % injection 3 mL  3 mL Intravenous Q12H Opyd, Timothy S, MD   3 mL at 10/24/23 1000     Discharge Medications: Please see discharge summary for a list of discharge medications.  Relevant Imaging Results:  Relevant Lab Results:   Additional Information SS#: [id number]  Kourtnei Rauber C Jazmene Racz, LCSWA

## 2023-10-24 NOTE — Plan of Care (Signed)
  Problem: Respiratory: Goal: Ability to maintain adequate ventilation will improve Outcome: Progressing Goal: Ability to maintain a clear airway will improve Outcome: Progressing   Problem: Respiratory: Goal: Ability to maintain a clear airway will improve Outcome: Progressing   Problem: Clinical Measurements: Goal: Ability to maintain a body temperature in the normal range will improve Outcome: Progressing   Problem: Activity: Goal: Ability to tolerate increased activity will improve Outcome: Progressing   Problem: Safety: Goal: Ability to remain free from injury will improve Outcome: Progressing   Problem: Skin Integrity: Goal: Risk for impaired skin integrity will decrease Outcome: Progressing

## 2023-10-24 NOTE — TOC Progression Note (Signed)
 Transition of Care [facility]) - Progression Note    Patient Details  Name: Megan Pitts MRN: [id number] Date of Birth: 09/28/1927  [facility] CM/SW Contact  Luise JAYSON Pan, [place] Phone Number: 10/24/2023, 11:16 AM  Clinical Narrative:   Per dialy meeting with treatment team, patient currently on 6L HFNC. Will need to be on 5L or less of O2 to discharge to [facility]. CSW notified facility.   CSW will continue to follow.   Expected Discharge Plan:  (TBD) Barriers to Discharge: Continued Medical Work up               Expected Discharge Plan and Services In-house Referral: Clinical Social Work, Hospice / Palliative Care Discharge Planning Services: CM Consult   Living arrangements for the past 2 months: Single Family Home                                       Social Drivers of Health (SDOH) Interventions SDOH Screenings   Food Insecurity: No Food Insecurity (09/07/2023)   Received from [facility]  Housing: Patient Declined (10/19/2023)  Transportation Needs: Patient Declined (10/19/2023)  Recent Concern: Transportation Needs - Unmet Transportation Needs (09/07/2023)   Received from [facility]  Utilities: Patient Declined (10/19/2023)  Alcohol  Screen: Low Risk  (06/30/2019)  Depression (PHQ2-9): Low Risk  (12/08/2020)  Financial Resource Strain: Low Risk  (09/07/2023)   Received from [facility]  Physical Activity: Inactive (06/30/2019)  Social Connections: Patient Declined (10/19/2023)  Recent Concern: Social Connections - Moderately Isolated (09/07/2023)   Received from [facility]  Stress: No Stress Concern Present (10/08/2023)   Received from [facility]  Tobacco Use: Low Risk  (10/18/2023)    Readmission Risk Interventions    09/04/2023    3:03 PM  Readmission Risk Prevention Plan  Transportation Screening Complete  PCP or Specialist Appt within 5-7 Days Complete  Home Care Screening Complete  Medication Review (RN CM)  Complete

## 2023-10-24 NOTE — Assessment & Plan Note (Addendum)
 Discontinue diltiazem and anticoagulation Discontinue telemetry

## 2023-10-24 NOTE — Progress Notes (Signed)
 Palliative Medicine Inpatient Follow Up Note   HPI: 89 y.o. female  with past medical history of hypertension, hyperlipidemia, chronic HFpEF, dementia, hypothyroidism, atrial fibrillation on Eliquis, and continued hypoxic respiratory failure since admission for pneumonia in August 2025  admitted on 10/18/2023 with worsening shortness of breath and increased oxygen requirement  .      PMT has been consulted to assist with goals of care conversation. Patient/Family face treatment option decisions, advanced directive decisions and anticipatory care needs.    Family face treatment option decision, advance directive decisions and anticipatory care needs.     Today's Discussion 10/24/2023  *Please note that this is a verbal dictation therefore any spelling or grammatical errors are due to the Dragon Medical One system interpretation.  Chart reviewed inclusive of vital signs, progress notes, laboratory results, and diagnostic images.   I visited the patient at bedside today; no family members were present. She appeared chronically ill but was not in any acute distress. The patient was alert and oriented, able to express her needs, though noted to be significantly hard of hearing. She reported ongoing shortness of breath with exertion. During her morning bath, she desaturated to 83% while on 6L high-flow nasal cannula (HFNC). She is currently on 9L HFNC, with plans to taper as tolerated. She mentioned that her daughter is expected to visit later today.  The treatment plan remains unchanged in alignment with the patient's goals of care. She wishes to continue current medical management in hopes of improvement, but has clearly stated that if her condition worsens, she would prefer comfort-focused care. This pathway was reviewed yesterday with the patient, her daughter, and son-in-law.  If her oxygen requirements can be safely reduced and her saturation maintained between 88-92%, plan is to discharge  to a skilled nursing facility (SNF) with outpatient palliative support will be considered. We will continue to monitor her clinical status and revisit goals of care as appropriate.   Created space and opportunity for patient to explore thoughts feelings and fears regarding current medical situation.  Patient and her family face treatment option decisions, advanced directive decisions and anticipatory care needs.   Questions and concerns addressed   Palliative Support Provided.   Objective Assessment: Vital Signs Vitals:   10/24/23 1012 10/24/23 1054  BP:  (!) 157/39  Pulse:  70  Resp:  20  Temp:  97.6 F (36.4 C)  SpO2: 90% 90%    Intake/Output Summary (Last 24 hours) at 10/24/2023 1201 Last data filed at 10/24/2023 0815 Gross per 24 hour  Intake 600 ml  Output 700 ml  Net -100 ml   Last Weight  Most recent update: 10/24/2023  5:50 AM    Weight  70.8 kg (156 lb 1.4 oz)             Physical Exam Vitals and nursing note reviewed.  HENT:     Head:     Comments: Hard of hearing.  Cardiovascular:     Rate and Rhythm: Normal rate.  Pulmonary:     Comments: Shortness of breath on exertion Musculoskeletal:     Comments: Generalized weakness  Neurological:     General: No focal deficit present.     Mental Status: She is alert.  Psychiatric:        Mood and Affect: Mood normal.   SUMMARY OF RECOMMENDATIONS     Code Status: Maintain DNR-Limited Continue current scope of care to allow time for outcomes If patient worsens or continue to decline,  patient/family open to transitioning to comfort-focused care.  If patient improves and safely able to d/c to SNF, I recommend outpatient palliative services. Spiritual care consult (placed 10/22/2023) Continue to provide psycho-social and emotional support to patient and family Palliative medicine team will continue to follow.    Symptom Management: Per Primary team Palliative medicine is available to assist as needed.     Time Spent: 50 minutes  Detailed review of medical records (labs, imaging, vital signs), medically appropriate exam, discussed with treatment team, counseling and education to patient, family, & staff, documenting clinical information, coordination of care.  ________________________________________________________________________ Kathlyne Bolder NP-C [place] Palliative Medicine Team Team Cell Phone: [phone] Please utilize secure chat with additional questions, if there is no response within 30 minutes please call the above phone number  Palliative Medicine Team providers are available by phone from 7am to 7pm daily and can be reached through the team cell phone.  Should this patient require assistance outside of these hours, please call the patient's attending physician.

## 2023-10-24 NOTE — Hospital Course (Addendum)
 Megan Pitts was admitted to the hospital with the working diagnosis of worsening respiratory failure   89 y.o. female with medical history significant for hypertension, hyperlipidemia, heart failure, dementia, hypothyroidism, atrial fibrillation, Recent hospitalization 8/25 to 8/28 for multifocal pneumonia, she was discharged to LTAC on antibiotic therapy and steroids. Patient required oxygenation with heated high flow nasal cannular up to 45l/min plus non re-breather mask. Eventually she was weaned off to 4 L/min per [place] and was transferred to SNF on 10/08/23.    Now presenting from her SNF for worsening dyspnea and increased oxygen requirement. Apparently she had another round of antibiotic therapy at SNF for pneumonia from 10/2 to 10/08.  Over the last 3 days prior to admission she had worsening dyspnea, her follow up chest radiograph has persistent infiltrates. On the day of admission her 02 saturation was 87% on 4 L.min per [place]. EMS was called, she was found in respiratory distress and patient was transported to the ED.   VBG 7.39/ 43/7/ 160/ 28/ 99%  Na 138, K 3.6 Cl 100 bicarbonate 23, glucose 152 bun 29 cr 1.21  BNP 411 Wbc 18,7 hgb 11.4 plt 276   Sars covid 19 negative Influenza negative RSV negative   Chest radiograph with mild cardiomegaly, bilateral interstitial infiltrates, upper lobes and lower lobes, peripherally.   EKG 80 bpm, normal axis, normal intervals, qtc 431, sinus rhythm with bilateral atrial enlargement, positive PAC, poor RR wave progression, no significant ST segment or T wave changes.   CT showed pulm fibrosis and she is needing high flow oxygen. Palliative consulted.   Patient placed on antibiotics, steroids and diuretics.  Continue to have high 02 requirements.   10/17 continue to require high flow oxygen with minimal movement.  10/18 worsening respiratory failure, radiograph with worsening infiltrates.  10/19 continue to have worsening dyspnea and increased  02 requirements. Added heated high flow nasal cannula.

## 2023-10-24 NOTE — Progress Notes (Signed)
 Patient's Spo2 dropped to 83% on HFNC 6 liters after having a bath, O2 increased to HFNC 9 liters, will continue to monitor and taper down oxygen.

## 2023-10-24 NOTE — Assessment & Plan Note (Addendum)
 discontinue levothyroxine 

## 2023-10-24 NOTE — Plan of Care (Signed)
  Problem: Clinical Measurements: Goal: Ability to maintain a body temperature in the normal range will improve Outcome: Progressing   Problem: Respiratory: Goal: Ability to maintain adequate ventilation will improve Outcome: Progressing   Problem: Clinical Measurements: Goal: Will remain free from infection Outcome: Progressing Goal: Respiratory complications will improve Outcome: Progressing   Problem: Activity: Goal: Risk for activity intolerance will decrease Outcome: Progressing   Problem: Safety: Goal: Ability to remain free from injury will improve Outcome: Progressing   Problem: Skin Integrity: Goal: Risk for impaired skin integrity will decrease Outcome: Progressing

## 2023-10-24 NOTE — Assessment & Plan Note (Addendum)
 Transitioned to comfort care

## 2023-10-24 NOTE — Assessment & Plan Note (Addendum)
 Acute on chronic hypoxemic respiratory failure.  Suspected ILD acute flare.    CT chest with bilateral ground glass opacities, interlobular and intralobular septal thickening, traction bronchiectasis, more upper lobes.  10/18 chest radiograph with bilateral interstitial infiltrates, more dense on the right upper lobe.   Patient was placed on heated high flow nasal cannula, 60 L/min with 100% Fi02 02 saturation 89 to 91%   Patient has failed aggressive medica therapy with high dose systemic corticosteroids, IV antibiotics, and IV diuretics.  Continue to increase work of breathing and 02 requirements.   Decision was made to transition to comfort care.  She has been on bolus opiates and benzodiazepines, but she continue to have respiratory distress.  Plan to start on continuous infusion of morphine  IV, for dyspnea.  Continue supplemental 02 per heated high flow nasal cannula as tolerated.   Poor prognosis, discussed with Pulmonary Palliative care has been consulted.

## 2023-10-24 NOTE — Progress Notes (Signed)
  Progress Note   Patient: Megan Pitts FMW:[id number] DOB: 08-31-1927 DOA: 10/18/2023     6 DOS: the patient was seen and examined on 10/24/2023   Brief hospital course: Megan Pitts was admitted to the hospital with the working diagnosis of worsening respiratory failure   89 y.o. female with medical history significant for hypertension, hyperlipidemia, heart failure, dementia, hypothyroidism, atrial fibrillation, Recent hospitalization 8/25 to 8/28 for multifocal pneumonia, she was discharged on antibiotic therapy and steroids,  Now presenting from her SNF for worsening shortness of breath and increased oxygen requirement.  CT showed pulm fibrosis and she is needing high flow oxygen. Palliative consulted.   Assessment and Plan: Multifocal pneumonia Acute on chronic hypoxemic respiratory failure.  Today with increased work of breathing and increased 02 requirements.  02 saturation 92% on 7 L/ HFNC  CT chest with bilateral ground glass opacities, interlobular and intralobular septal thickening, traction bronchiectasis, more upper lobes.   Plan to continue systemic (change to IV methylprednisolone  60 mg IV bid) and inhaled steroids.  Antibiotic therapy  Supplemental 02 per [place].  Likely not candidate for antifibrotic therapy, will check with pulmonary.   Chronic diastolic CHF (congestive heart failure) (HCC) Echocardiogram with preserved LV systolic function with EF 60 to 65%, mild LVH, grade II diastolic dysfunction with pseudo normalization EA, RVSP 52.1 mmHg, mild mitral valve regurgitation, moderate mitral valve stenosis, mild to moderate tricuspid valve regurgitation, sp bioprosthetic valve replacement.   Urine output is 1300 ml Systolic blood pressure 150 mmHg   Continue furosemide , and irbesartan ,   Paroxysmal atrial fibrillation (HCC) Continue rate control with diltiazem and anticoagulation with apixaban,   Essential hypertension Continue blood pressure control with  amlodipine ,   CKD stage 3a, GFR 45-59 ml/min (HCC) Renal function with serum cr at 1,0 with K at 3,6 and serum bicarbonate at 26  Na 136 (10/13)  Check renal function today  Continue close follow up renal function and electrolytes,   Hypothyroidism Continue levothyroxine          Subjective: Patient continue to have dyspnea and increased work of breathing poor oral intake poor appetite   Physical Exam: Vitals:   10/24/23 1249 10/24/23 1310 10/24/23 1400 10/24/23 1540  BP:    (!) 154/51  Pulse: 65 67 62 66  Resp: (!) 25 (!) 27 19 20   Temp:    97.6 F (36.4 C)  TempSrc:    Oral  SpO2: 90% 91% 93% 92%  Weight:      Height:       Neurology awake and alert, deconditioned and ill looking appearing ENT with mild pallor Cardiovascular with S1 and S2 present, and regular with no gallops or rubs, no murmurs No JVD Respiratory with increased work of breathing, bilateral rales with no wheezing, poor inspiratory effort, no rhonchi  Abdomen with no distention  No lower extremity edema  Data Reviewed:    Family Communication: I spoke with patient's daughter over the phone , we talked in detail about patient's condition, and plan of care .   Disposition: Status is: Inpatient Remains inpatient appropriate because: respiratory failure   Planned Discharge Destination: SNF     Author: Elidia Toribio Furnace, MD 10/24/2023 5:16 PM  For on call review [email or web address].

## 2023-10-24 NOTE — Assessment & Plan Note (Addendum)
 Echocardiogram with preserved LV systolic function with EF 60 to 65%, mild LVH, grade II diastolic dysfunction with pseudo normalization EA, RVSP 52.1 mmHg, mild mitral valve regurgitation, moderate mitral valve stenosis, mild to moderate tricuspid valve regurgitation, sp bioprosthetic valve replacement.   Urine output documented is 450 ml  Systolic blood pressure 150 mmHg   Continue furosemide , and irbesartan  Keep negative fluid balance

## 2023-10-24 NOTE — Assessment & Plan Note (Addendum)
 Today renal function with serum cr at 1,0 with K at 4,0 and serum bicarbonate at 25  Na 142 and P 3.8   Continue diuresis with furosemide  IV  Follow up renal function and electrolytes.

## 2023-10-25 ENCOUNTER — Inpatient Hospital Stay (HOSPITAL_COMMUNITY)

## 2023-10-25 DIAGNOSIS — J9621 Acute and chronic respiratory failure with hypoxia: Secondary | ICD-10-CM | POA: Diagnosis not present

## 2023-10-25 DIAGNOSIS — R0602 Shortness of breath: Secondary | ICD-10-CM | POA: Diagnosis not present

## 2023-10-25 DIAGNOSIS — J188 Other pneumonia, unspecified organism: Secondary | ICD-10-CM | POA: Diagnosis not present

## 2023-10-25 DIAGNOSIS — R918 Other nonspecific abnormal finding of lung field: Secondary | ICD-10-CM | POA: Diagnosis not present

## 2023-10-25 DIAGNOSIS — Z515 Encounter for palliative care: Secondary | ICD-10-CM | POA: Diagnosis not present

## 2023-10-25 DIAGNOSIS — Z7189 Other specified counseling: Secondary | ICD-10-CM | POA: Diagnosis not present

## 2023-10-25 DIAGNOSIS — I5032 Chronic diastolic (congestive) heart failure: Secondary | ICD-10-CM | POA: Diagnosis not present

## 2023-10-25 DIAGNOSIS — E039 Hypothyroidism, unspecified: Secondary | ICD-10-CM | POA: Diagnosis not present

## 2023-10-25 DIAGNOSIS — I4891 Unspecified atrial fibrillation: Secondary | ICD-10-CM | POA: Diagnosis not present

## 2023-10-25 DIAGNOSIS — I480 Paroxysmal atrial fibrillation: Secondary | ICD-10-CM | POA: Diagnosis not present

## 2023-10-25 DIAGNOSIS — N189 Chronic kidney disease, unspecified: Secondary | ICD-10-CM

## 2023-10-25 LAB — CBC
HCT: 29.7 % — ABNORMAL LOW (ref 36.0–46.0)
Hemoglobin: 9.7 g/dL — ABNORMAL LOW (ref 12.0–15.0)
MCH: 28.6 pg (ref 26.0–34.0)
MCHC: 32.7 g/dL (ref 30.0–36.0)
MCV: 87.6 fL (ref 80.0–100.0)
Platelets: 275 K/uL (ref 150–400)
RBC: 3.39 MIL/uL — ABNORMAL LOW (ref 3.87–5.11)
RDW: 17.2 % — ABNORMAL HIGH (ref 11.5–15.5)
WBC: 15.1 K/uL — ABNORMAL HIGH (ref 4.0–10.5)
nRBC: 0 % (ref 0.0–0.2)

## 2023-10-25 LAB — RESPIRATORY PANEL BY PCR

## 2023-10-25 LAB — BASIC METABOLIC PANEL WITH GFR
Anion gap: 13 (ref 5–15)
BUN: 24 mg/dL — ABNORMAL HIGH (ref 8–23)
CO2: 27 mmol/L (ref 22–32)
Calcium: 8.7 mg/dL — ABNORMAL LOW (ref 8.9–10.3)
Chloride: 100 mmol/L (ref 98–111)
Creatinine, Ser: 0.94 mg/dL (ref 0.44–1.00)
GFR, Estimated: 57 mL/min — ABNORMAL LOW (ref >60)
Glucose, Bld: 159 mg/dL — ABNORMAL HIGH (ref 70–99)
Potassium: 4 mmol/L (ref 3.5–5.1)
Sodium: 140 mmol/L (ref 135–145)

## 2023-10-25 LAB — MRSA NEXT GEN BY PCR, NASAL: MRSA by PCR Next Gen: NOT DETECTED

## 2023-10-25 NOTE — Progress Notes (Signed)
 Megan Pitts [phone] [facility] Liaison Note:  Notified by [facility] manager of patient/family request for [facility]iative services at [facility] after discharge. Please call with any hospice or outpatient palliative care related questions. Thank you for the opportunity to participate in this patient's care.  Eleanor Nail, LPN [facility] Liaison [phone]

## 2023-10-25 NOTE — Consult Note (Addendum)
 NAME:  Megan Pitts, MRN:  [id number], DOB:  [birth year], LOS: 7 ADMISSION DATE:  10/18/2023, CONSULTATION DATE:  10/25/2023 REFERRING MD:  Dr Toribio, CHIEF COMPLAINT:  ILD   History of Present Illness:  [name] is a 89 year old female with history of chronic heart failure with preserved EF, dementia, hypothyroidism, A-fib, aortic stenosis status post AVR, CKD subacute hypoxic respiratory failure since August/2025, who is admitted to floor from nursing home for worsening shortness of breath and increasing oxygen requirements.  She was admitted to the hospital in August/2025 for community-acquired pneumonia.  [facility].  She has ongoing oxygen needs since then Reports shortness of breath with minimal exertion. Denies wheezing, fever or chills Reports cough with occasional sputum production  She was admitted to this hospital on 10/09.  Admission labs were concerning for heart failure exacerbation with BNP of 411, leukocytosis of 18, with left shift.  Microbiologic workup so far is negative.  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  Currently on 5 L  Interim History / Subjective:  Reports shortness of breath while talking   Objective   Blood pressure (!) 179/47, pulse 74, temperature 97.8 F (36.6 C), temperature source Oral, resp. rate 20, height 5' 3 (1.6 m), weight 75.7 kg, SpO2 93%.        Intake/Output  Summary (Last 24 hours) at 10/25/2023 1413 Last data filed at 10/25/2023 1241 Gross per 24 hour  Intake 937 ml  Output 1350 ml  Net -413 ml   Filed Weights   10/23/23 0435 10/24/23 0550 10/25/23 0348  Weight: 73.6 kg 70.8 kg 75.7 kg    Examination: General:  Elderly lady, frail Hard of hearing Diffuse crackles bilateral lungs No distinct pedal edema  Lab/imaging review:  Reviewed chest x-rays and admission notes from August/2025 and this admission Reviewed CT images 10/2023    Assessment & Plan:   Acute on chronic hypoxic respiratory failure ILD Acute on chronic heart failure with preserved ejection fraction CKD Chronic anemia History of A-fib  - continue antibiotics. Will send viral panel although this seems to be going on for a while - spoke to the family who is concerned about her higher oxygen needs going to a facility. Would recommend case management looking into options to have 10L oxygen available at d/c if possible - will send autoimmune labs to explore rheumatologic causes of ILD - pt has a hx of Afib, dont see amiodarone on her med list. Family mentioned recent covid infection but pt wasnot acutely sick at that time, so doesn't seem to be post covid fibrosis - she has underlying ILD and superimposed shadows which could be infiltrate, edema, inflammatory flare up - agree with empiric steroids as pt and family are against aggressive procedures - I discussed the role of diuretics and benefit with respi failure in CHF pt and risk of kidney damage. Family understands. In order to improve her oxygenation, I would recommend increasing her diuretics (pt on 40mg  lasix  since  10/10)- even if affects her kidney function somewhat as pt is interested in palliative care but not ready for hospice yet   Thank you for the opportunity to take part in the care of this patient   Labs   CBC: Recent Labs  Lab 10/18/23 1943 10/18/23 2021 10/19/23 0448 10/20/23 0300  10/21/23 0224 10/22/23 0301 10/25/23 0249  WBC 18.7*  --  14.9* 13.9* 14.5* 13.8* 15.1*  NEUTROABS 13.4*  --   --   --   --   --   --   HGB 11.4*   < > 10.3* 10.6* 9.2* 8.8* 9.7*  HCT 35.2*   < > 31.6* 33.0* 28.2* 27.2* 29.7*  MCV 87.8  --  88.3 87.3 87.3 86.9 87.6  PLT 276  --  233 227 210 220 275   < > = values in this interval not displayed.    Basic Metabolic Panel: Recent Labs  Lab 10/19/23 0448 10/20/23 0300 10/21/23 0224 10/22/23 0301 10/24/23 1821 10/25/23 0249  NA 138 138 137 136 138 140  K 4.9 4.3 3.7 3.6 3.9 4.0  CL 103 101 100 100 98 100  CO2 22 27 26 26 24 27   GLUCOSE 86 106* 105* 104* 271* 159*  BUN 28* 32* 29* 28* 27* 24*  CREATININE 1.23* 1.17* 0.92 1.07* 1.15* 0.94  CALCIUM  8.2* 8.7* 8.5* 8.4* 8.8* 8.7*  MG 1.7  --   --   --   --   --    GFR: Estimated Creatinine Clearance: 36.4 mL/min (by C-G formula based on SCr of 0.94 mg/dL). Recent Labs  Lab 10/20/23 0300 10/21/23 0224 10/22/23 0301 10/25/23 0249  WBC 13.9* 14.5* 13.8* 15.1*    Liver Function Tests: Recent Labs  Lab 10/18/23 1943  AST 21  ALT 21  ALKPHOS 44  BILITOT 1.3*  PROT 6.0*  ALBUMIN 2.9*   No results for input(s): LIPASE, AMYLASE in the last 168 hours. No results for input(s): AMMONIA in the last 168 hours.  ABG    Component Value Date/Time   HCO3 26.5 10/18/2023 2021   TCO2 28 10/18/2023 2021   TCO2 27 10/18/2023 2021   ACIDBASEDEF 3.6 (H) 09/05/2023 0950   O2SAT 99 10/18/2023 2021     Coagulation Profile: Recent Labs  Lab 10/18/23 1943  INR 1.0    Cardiac Enzymes: No results for input(s): CKTOTAL, CKMB, CKMBINDEX, TROPONINI in the last 168 hours.  HbA1C: No results found for: HGBA1C  CBG: No results for input(s): GLUCAP in the last 168 hours.  Review of Systems:   Negative except mentioned in HPI  Past Medical History:  She,  has a past medical history of A-fib [facility]), Aortic stenosis, Carotid bruit (09/2008), GERD (gastroesophageal  reflux disease), Gilbert's syndrome, Hyperlipidemia, Hypertension, IFG (impaired fasting glucose), OAB (overactive bladder), Osteopenia, Phlebitis, PVC's (premature ventricular contractions), Shingles, Varicose veins, and Vertigo.   Surgical History:   Past Surgical History:  Procedure Laterality Date   ABDOMINAL HYSTERECTOMY  1963   partial   AORTIC VALVE REPLACEMENT  08/01/2005   WITH A #23MM TORONTO STENTLESS PORCINE AORTIC VALVE   CARDIAC CATHETERIZATION  07/25/2005   EF 60% THE AORTIC VALVE IS HEAVILY CALCIFIED WITH REDUCED OPENING. NO MVP   FOOT SURGERY     HAND SURGERY     HEMORRHOID SURGERY  1980's   LUMBAR LAMINECTOMY/DECOMPRESSION MICRODISCECTOMY N/A 06/15/2023   Procedure: LUMBAR LAMINECTOMY MICRODISCECTOMY LUMBAR TWO-THREE;  Surgeon: Gillie Duncans, MD;  Location: [facility] OR;  Service: Neurosurgery;  Laterality: N/A;  L2-3 Lumbar Discectomy   ORIF ANKLE FRACTURE Right 09/11/2020   Procedure: OPEN REDUCTION INTERNAL FIXATION (ORIF) ANKLE FRACTURE;  Surgeon: Cristy Bonner DASEN, MD;  Location: [facility] OR;  Service: Orthopedics;  Laterality: Right;   US  ECHOCARDIOGRAPHY  04/14/2008   EF 55-60%. NORMAL   US  ECHOCARDIOGRAPHY  11/21/2005   EF 55-60%   US  ECHOCARDIOGRAPHY  07/20/2005   EF 55-60%   US  ECHOCARDIOGRAPHY  10/19/2004   EF 55-60%   VARICOSE VEIN SURGERY  2010   laser treatment     Social History:   reports that she has never smoked. She has never used smokeless tobacco. She reports that she does not drink alcohol  and does not use drugs.   Family History:  Her family history includes Alcohol  abuse in her brother; Alzheimer's disease in her father; Leukemia in her brother; Pneumonia (age of onset: 46) in her mother.   Allergies Allergies  Allergen Reactions   Levothyroxine  Other (See Comments)    Unknown reaction per SNF   Tramadol     Unknown reaction per SNF     Home Medications  Prior to Admission medications   Medication Sig Start Date End Date Taking? Authorizing Provider   Acidophilus Lactobacillus CAPS Take 1 tablet by mouth in the morning and at bedtime. 10/08/23  Yes [provider]  apixaban (ELIQUIS) 2.5 MG TABS tablet Take 2.5 mg by mouth 2 (two) times daily.   Yes [provider]  budesonide  (PULMICORT ) 0.25 MG/2ML nebulizer solution Take 2 mLs (0.25 mg total) by nebulization 2 (two) times daily. 09/06/23  Yes Drusilla Sabas RAMAN, MD  diltiazem (CARDIZEM CD) 120 MG 24 hr capsule Take 120 mg by mouth in the morning.   Yes [provider]  donepezil  (ARICEPT ) 10 MG tablet Take 10 mg by mouth at bedtime.  01/11/15  Yes [provider]  furosemide  (LASIX ) 40 MG tablet Take 1 tablet (40 mg total) by mouth daily. 09/06/23 09/05/24 Yes Drusilla Sabas RAMAN, MD  hydrALAZINE  (APRESOLINE ) 50 MG tablet Take 50 mg by mouth every 8 (eight) hours. 10/08/23  Yes [provider]  ipratropium-albuterol  (DUONEB) 0.5-2.5 (3) MG/3ML SOLN Take 3 mLs by nebulization in the morning, at noon, and at bedtime.   Yes [provider]  levothyroxine  (SYNTHROID ) 50 MCG tablet Take 50 mcg by mouth daily. 09/18/22  Yes [provider]  losartan (COZAAR) 100 MG tablet Take 100 mg by mouth daily.   Yes [provider]  metoprolol tartrate (LOPRESSOR) 50 MG tablet Take 50 mg by mouth 2 (two) times daily.   Yes [provider]  pantoprazole (PROTONIX) 40 MG tablet Take 40 mg by mouth daily.   Yes [provider]  polyethylene glycol (MIRALAX  / GLYCOLAX ) 17 g packet Take 17 g by mouth daily.   Yes [provider]  predniSONE (DELTASONE) 20 MG tablet Take 20 mg by mouth daily with breakfast.   Yes [provider]  senna (SENOKOT) 8.6 MG TABS tablet Take 1 tablet by mouth in the morning and at bedtime.   Yes [provider]  amLODipine  (NORVASC ) 10 MG tablet Take 10 mg by mouth daily. Patient not taking: Reported on 10/18/2023 03/19/19   [provider]  aspirin  EC 81 MG tablet Take 81 mg by mouth  daily. Swallow whole. Patient not taking: Reported on 10/18/2023    [provider]  guaiFENesin -dextromethorphan  (ROBITUSSIN DM) 100-10 MG/5ML syrup Take 5 mLs by mouth every 4 (four) hours as needed for cough. Patient  not taking: Reported on 10/18/2023 09/06/23   Drusilla Sabas RAMAN, MD  ipratropium (ATROVENT ) 0.02 % nebulizer solution Take 2.5 mLs (0.5 mg total) by nebulization every 6 (six) hours. Patient not taking: Reported on 10/18/2023 09/06/23   Drusilla Sabas RAMAN, MD  levalbuterol  (XOPENEX ) 0.63 MG/3ML nebulizer solution Take 3 mLs (0.63 mg total) by nebulization every 6 (six) hours. Patient not taking: Reported on 10/18/2023 09/06/23   Drusilla Sabas RAMAN, MD  olmesartan (BENICAR) 40 MG tablet Take 40 mg by mouth daily. Patient not taking: Reported on 10/18/2023    [provider]     Nema Oatley Pleas, MD 10/25/23 2:13 PM [facility]  For contact information, see Amion. If no response to pager, please call PCCM consult pager. After hours, 7PM- 7AM, please call [place].

## 2023-10-25 NOTE — Progress Notes (Signed)
 Palliative Medicine Inpatient Follow Up Note   HPI: 89 y.o. female  with past medical history of hypertension, hyperlipidemia, chronic HFpEF, dementia, hypothyroidism, atrial fibrillation on Eliquis, and continued hypoxic respiratory failure since admission for pneumonia in August 2025  admitted on 10/18/2023 with worsening shortness of breath and increased oxygen requirement  .      PMT has been consulted to assist with goals of care conversation. Patient/Family face treatment option decisions, advanced directive decisions and anticipatory care needs.    Family face treatment option decision, advance directive decisions and anticipatory care needs.     Today's Discussion 10/25/2023  *Please note that this is a verbal dictation therefore any spelling or grammatical errors are due to the Dragon Medical One system interpretation.  Chart reviewed inclusive of vital signs, progress notes, laboratory results, and diagnostic images.   Patient was seen at bedside today. No family members were present during the visit. She was observed sitting upright in bed, appearing comfortable and without signs of acute distress. She is hard of hearing but communicated effectively. She denied any pain or discomfort and reported having had a restful night. She mentioned that Tylenol  helps her sleep. Currently, she is on 5?LPM via high-flow nasal cannula, with oxygen saturation ranging between 93-94%. She continues to experience shortness of breath, particularly with exertion. The patient expressed hope that her respiratory status will improve sufficiently to allow for a safe transition to a skilled nursing facility.  Patient is currently on Solumedrol 60 mg every 12 hours, along with scheduled and PRN inhalations.   Treatment plan remains unchanged. Continue current medical management, allow time for outcomes possibly over the weekend, hoping for improvement.  If patient able to wean O2 requirements, plan is to  be discharged to SNF [facility]) with outpatient palliative.    Created space and opportunity for patient to explore thoughts feelings and fears regarding current medical situation.  Patient and her family face treatment option decisions, advanced directive decisions and anticipatory care needs.   Questions and concerns addressed   Palliative Support Provided.   Objective Assessment: Vital Signs Vitals:   10/25/23 0348 10/25/23 0705  BP: (!) 112/56 (!) 176/41  Pulse: (!) 59 63  Resp: 20 20  Temp: 97.8 F (36.6 C) 97.9 F (36.6 C)  SpO2: 95% 96%    Intake/Output Summary (Last 24 hours) at 10/25/2023 0921 Last data filed at 10/25/2023 0616 Gross per 24 hour  Intake 660 ml  Output 1350 ml  Net -690 ml   Last Weight  Most recent update: 10/25/2023  3:49 AM    Weight  75.7 kg (166 lb 14.2 oz)             Physical Exam Vitals and nursing note reviewed.  HENT:     Head:     Comments: Hard of hearing.  Cardiovascular:     Rate and Rhythm: Normal rate.  Pulmonary:     Comments: Shortness of breath on exertion Musculoskeletal:     Comments: Generalized weakness  Neurological:     General: No focal deficit present.     Mental Status: She is alert.  Psychiatric:        Mood and Affect: Mood normal.   SUMMARY OF RECOMMENDATIONS     Code Status: Maintain DNR-Limited Continue current scope of care to allow time for outcomes If patient worsens or continue to decline, patient/family open to transitioning to comfort-focused care.  If patient improves and safely able to d/c to SNF,  I recommend outpatient palliative services. Spiritual care consult (placed 10/22/2023) Continue to provide psycho-social and emotional support to patient and family Palliative medicine team will continue to follow.    Symptom Management: Per Primary team Palliative medicine is available to assist as needed.    Time Spent: 35 minutes  Detailed review of medical records (labs, imaging,  vital signs), medically appropriate exam, discussed with treatment team, counseling and education to patient, family, & staff, documenting clinical information, coordination of care.  ________________________________________________________________________ Kathlyne Bolder NP-C [place] Palliative Medicine Team Team Cell Phone: [phone] Please utilize secure chat with additional questions, if there is no response within 30 minutes please call the above phone number  Palliative Medicine Team providers are available by phone from 7am to 7pm daily and can be reached through the team cell phone.  Should this patient require assistance outside of these hours, please call the patient's attending physician.

## 2023-10-25 NOTE — Progress Notes (Addendum)
  Progress Note   Patient: Megan Pitts FMW:[id number] DOB: 1928-01-07 DOA: 10/18/2023     7 DOS: the patient was seen and examined on 10/25/2023   Brief hospital course: Megan Pitts was admitted to the hospital with the working diagnosis of worsening respiratory failure   89 y.o. female with medical history significant for hypertension, hyperlipidemia, heart failure, dementia, hypothyroidism, atrial fibrillation, Recent hospitalization 8/25 to 8/28 for multifocal pneumonia, she was discharged on antibiotic therapy and steroids,  Now presenting from her SNF for worsening shortness of breath and increased oxygen requirement.  CT showed pulm fibrosis and she is needing high flow oxygen. Palliative consulted.   Patient placed on antibiotics, steroids and diuretics.  Continue to have high 02 requirements.    Assessment and Plan: Multifocal pneumonia Acute on chronic hypoxemic respiratory failure.  Suspected ILD   CT chest with bilateral ground glass opacities, interlobular and intralobular septal thickening, traction bronchiectasis, more upper lobes.   Today with improved 02 requirements to 4 L/min per [place], with 02 saturation 94%  Continue with IV methylprednisolone  60 mg IV bid and inhaled steroids.  Bronchodilator therapy  Airway clearing techniques.  Antibiotic therapy with azithromycin  and ceftriaxone .   Likely ongoing chronic ILD now exacerbated, will consult pulmonary for a formal diagnosis.   Chronic diastolic CHF (congestive heart failure) (HCC) Echocardiogram with preserved LV systolic function with EF 60 to 65%, mild LVH, grade II diastolic dysfunction with pseudo normalization EA, RVSP 52.1 mmHg, mild mitral valve regurgitation, moderate mitral valve stenosis, mild to moderate tricuspid valve regurgitation, sp bioprosthetic valve replacement.   Urine output is 1350 ml Systolic blood pressure 150 to 180 mmHg   Continue furosemide , and irbesartan  Keep negative fluid balance    Paroxysmal atrial fibrillation (HCC) Continue rate control with diltiazem and metoprolol anticoagulation with apixaban,   Essential hypertension Continue blood pressure control with amlodipine ,   CKD stage 3a, GFR 45-59 ml/min (HCC) Today renal function with serum cr at 0,94 with K at 4,0 and serum bicarbonate at 27  NA 140   Keep negative fluid balance with furosemide  Continue close follow up renal function and electrolytes.   Hypothyroidism Continue levothyroxine        Subjective: patient with improvement in dyspnea, no chest pain, continue very weak and deconditioned, poor oral intake  Physical Exam: Vitals:   10/25/23 0348 10/25/23 0705 10/25/23 1015 10/25/23 1058  BP: (!) 112/56 (!) 176/41 (!) 183/47 (!) 179/47  Pulse: (!) 59 63 71 74  Resp: 20 20 15 20   Temp: 97.8 F (36.6 C) 97.9 F (36.6 C)  97.8 F (36.6 C)  TempSrc: Oral Oral  Oral  SpO2: 95% 96% 94% 93%  Weight: 75.7 kg     Height:       Neurology awake and alert ENT with mild pallor Cardiovascular with S1 and S2 present and regular with positive systolic murmur at the apex Respiratory with bilateral rales with no wheezing, no rhonchi, poor inspiratory effort Abdomen with no distention No lower extremity edema   Data Reviewed:    Family Communication: no family at the bedside.  I spoke with patient's daughter over the phone, we talked in detail about patient's condition, plan of care and prognosis and all questions were addressed.    Disposition: Status is: Inpatient Remains inpatient appropriate because: respiratory failure   Planned Discharge Destination: Home     Author: Elidia Toribio Furnace, MD 10/25/2023 12:57 PM  For on call review [email or web address].

## 2023-10-25 NOTE — TOC Progression Note (Signed)
 Transition of Care [facility]) - Progression Note    Patient Details  Name: Megan Pitts MRN: [id number] Date of Birth: 1927-12-04  Transition of Care [facility]) CM/SW Contact  Luise JAYSON Pan, [place] Phone Number: 10/25/2023, 10:41 AM  Clinical Narrative:   Per bedside RN, patient is on 4L O2 regular nasal cannula. Per Palliative, if discharge plan continues to be SNF they rec outpatient palliative to follow. CSW notified facility of above information. [employer] with [facility] for palliative services. CSW to initiate referral.   CSW will continue to follow.    Expected Discharge Plan: Skilled Nursing Facility Barriers to Discharge: Continued Medical Work up               Expected Discharge Plan and Services In-house Referral: Clinical Social Work, Hospice / Palliative Care Discharge Planning Services: CM Consult   Living arrangements for the past 2 months: Single Family Home                                       Social Drivers of Health (SDOH) Interventions SDOH Screenings   Food Insecurity: No Food Insecurity (09/07/2023)   Received from [facility]  Housing: Patient Declined (10/19/2023)  Transportation Needs: Patient Declined (10/19/2023)  Recent Concern: Transportation Needs - Unmet Transportation Needs (09/07/2023)   Received from [facility]  Utilities: Patient Declined (10/19/2023)  Alcohol  Screen: Low Risk  (06/30/2019)  Depression (PHQ2-9): Low Risk  (12/08/2020)  Financial Resource Strain: Low Risk  (09/07/2023)   Received from [facility]  Physical Activity: Inactive (06/30/2019)  Social Connections: Patient Declined (10/19/2023)  Recent Concern: Social Connections - Moderately Isolated (09/07/2023)   Received from [facility]  Stress: No Stress Concern Present (10/08/2023)   Received from [facility]  Tobacco Use: Low Risk  (10/18/2023)    Readmission Risk Interventions    09/04/2023    3:03 PM  Readmission Risk Prevention  Plan  Transportation Screening Complete  PCP or Specialist Appt within 5-7 Days Complete  Home Care Screening Complete  Medication Review (RN CM) Complete

## 2023-10-25 NOTE — Plan of Care (Signed)
  Problem: Clinical Measurements: Goal: Will remain free from infection Outcome: Progressing Goal: Cardiovascular complication will be avoided Outcome: Progressing   Problem: Elimination: Goal: Will not experience complications related to bowel motility Outcome: Progressing Goal: Will not experience complications related to urinary retention Outcome: Progressing   Problem: Safety: Goal: Ability to remain free from injury will improve Outcome: Progressing

## 2023-10-26 DIAGNOSIS — I5032 Chronic diastolic (congestive) heart failure: Secondary | ICD-10-CM | POA: Diagnosis not present

## 2023-10-26 DIAGNOSIS — I10 Essential (primary) hypertension: Secondary | ICD-10-CM | POA: Diagnosis not present

## 2023-10-26 DIAGNOSIS — J188 Other pneumonia, unspecified organism: Secondary | ICD-10-CM | POA: Diagnosis not present

## 2023-10-26 DIAGNOSIS — I480 Paroxysmal atrial fibrillation: Secondary | ICD-10-CM | POA: Diagnosis not present

## 2023-10-26 LAB — BASIC METABOLIC PANEL WITH GFR
Anion gap: 14 (ref 5–15)
BUN: 34 mg/dL — ABNORMAL HIGH (ref 8–23)
CO2: 26 mmol/L (ref 22–32)
Calcium: 8.7 mg/dL — ABNORMAL LOW (ref 8.9–10.3)
Chloride: 100 mmol/L (ref 98–111)
Creatinine, Ser: 1 mg/dL (ref 0.44–1.00)
GFR, Estimated: 53 mL/min — ABNORMAL LOW (ref >60)
Glucose, Bld: 177 mg/dL — ABNORMAL HIGH (ref 70–99)
Potassium: 3.7 mmol/L (ref 3.5–5.1)
Sodium: 140 mmol/L (ref 135–145)

## 2023-10-26 LAB — SEDIMENTATION RATE: Sed Rate: 50 mm/h — ABNORMAL HIGH (ref 0–22)

## 2023-10-26 MED ORDER — METHYLPREDNISOLONE SODIUM SUCC 40 MG IJ SOLR
40.0000 mg | Freq: Two times a day (BID) | INTRAMUSCULAR | Status: DC
  Administered 2023-10-26 – 2023-10-28 (×4): 40 mg via INTRAVENOUS
  Filled 2023-10-26 (×4): qty 1

## 2023-10-26 MED ORDER — HYDRALAZINE HCL 20 MG/ML IJ SOLN
5.0000 mg | Freq: Four times a day (QID) | INTRAMUSCULAR | Status: DC | PRN
  Administered 2023-10-26: 5 mg via INTRAVENOUS
  Filled 2023-10-26: qty 1

## 2023-10-26 MED ORDER — ENSURE PLUS HIGH PROTEIN PO LIQD
237.0000 mL | Freq: Two times a day (BID) | ORAL | Status: DC
  Administered 2023-10-26 – 2023-10-27 (×3): 237 mL via ORAL

## 2023-10-26 MED ADMIN — Potassium Chloride Microencapsulated Crys ER Tab 20 mEq: 40 meq | ORAL | NDC 00245531989

## 2023-10-26 MED FILL — Potassium Chloride Microencapsulated Crys ER Tab 20 mEq: 40.0000 meq | ORAL | Qty: 2 | Status: AC

## 2023-10-26 NOTE — Care Management Important Message (Signed)
 Important Message  Patient Details  Name: Megan Pitts MRN: [id number] Date of Birth: [birth year]   Important Message Given:  Yes - Medicare IM     [name] 10/26/2023, 11:36 AM

## 2023-10-26 NOTE — Progress Notes (Signed)
 Initial Nutrition Assessment  DOCUMENTATION CODES:   Not applicable  INTERVENTION:   Ensure Plus High Protein po BID, each supplement provides 350 kcal and 20 grams of protein.  Discussed with patient importance for adequate nutrition and energy levels/healing. Reviewed menu options with patient and will ask for order assistance.    NUTRITION DIAGNOSIS:   Inadequate oral intake related to decreased appetite (SOB) as evidenced by per patient/family report (self-limits protein foods).  GOAL:   Patient will meet greater than or equal to 90% of their needs  MONITOR:   PO intake, Supplement acceptance  REASON FOR ASSESSMENT:   Consult Assessment of nutrition requirement/status  ASSESSMENT:   Pt with PMH fo CHF with preserved EF, dementia, hypothyroidism, Afib, aortic stenosis s/p AVR, CKD, subacute hypoxic respiratory failure since Aug 2025 ([facility]) admitted from SNF with acute on chronic hypoxic respiratory failure/ILD.   Spoke with pt who is awake and alert and able to answer questions. Pt did appear slightly out of breath during interview. Pt was hard of hearing. She reports she was walking prior to May 2025 before she had spinal surgery but has not been walking since. She reports no appetite due to laying in bed all the time. She does like the food but admits to SOB with eating and self-limits beef, chicken, pork because these take more effort to eat. Pt does like yogurt and is willing to drink ensure and would like chocolate.   4 L HFNC   Meal Completion: 50-100% per record review   Medications reviewed and include: lasix , synthroid , solumedrol   Labs reviewed   UOP 1350 ml -> 450 ml (question accuracy)  NUTRITION - FOCUSED PHYSICAL EXAM:  Flowsheet Row Most Recent Value  Orbital Region No depletion  Upper Arm Region No depletion  Thoracic and Lumbar Region No depletion  Buccal Region No depletion  Temple Region Mild depletion  Clavicle Bone  Region No depletion  Clavicle and Acromion Bone Region No depletion  Scapular Bone Region No depletion  Dorsal Hand Severe depletion  [Likely related to arthritis]  Patellar Region No depletion  Anterior Thigh Region No depletion  Posterior Calf Region Severe depletion  [patient wheelchair/bed bound]  Edema (RD Assessment) None  Hair Reviewed  Eyes Reviewed  Mouth Reviewed  Skin Reviewed  [ecchymosis]  Nails Reviewed    Diet Order:   Diet Order             Diet regular Room service appropriate? Yes; Fluid consistency: Thin  Diet effective now                   EDUCATION NEEDS:   Education needs have been addressed  Skin:  Skin Assessment: Reviewed RN Assessment  Last BM:  10/15 medium  Height:   Ht Readings from Last 1 Encounters:  10/18/23 5' 3 (1.6 m)    Weight:   Wt Readings from Last 1 Encounters:  10/26/23 76.7 kg    BMI:  Body mass index is 29.95 kg/m.  Estimated Nutritional Needs:   Kcal:  1400-1600  Protein:  65-75 grams  Fluid:  >1.5 L/day  Powell SQUIBB., RD, LDN, CNSC See AMiON for contact information

## 2023-10-26 NOTE — Progress Notes (Signed)
 Physical Therapy Treatment Patient Details Name: Megan Pitts MRN: [id number] DOB: 07-12-1927 Today's Date: 10/26/2023   History of Present Illness 89 y/o F adm from [facility] 10/18/23 with SOB, respiratory failure. PMHx: Afib, AVR, GERD, HLD, HTN, shingles, HFpEF, vertigo, dementia, L2-3 lami    PT Comments  Pt pleasant with daughter Odetta present during session to confirm several months of pt not performing mobility beyond transfers and several weeks of not even transferring due to desaturation. Pt on 4L on arrival with pivot to EOB pt desaturated to 78%, increased O2 to 6L with increase to 85% over 2 min. Pt with ultimate need for 8L to recover EOB grossly a total of 7 min prior to pt fatigue and return to supine for bed level HEP at 8L. End of session on 6L at 92% with RN aware and family present. Pt with improved RLE movement this date but limited by pulmonary function and lack of reserve for activity. Pt with desire to be able to get OOB to chair and aware of current limitations.    If plan is discharge home, recommend the following: A lot of help with walking and/or transfers;A lot of help with bathing/dressing/bathroom;Assistance with cooking/housework;Direct supervision/assist for financial management;Assist for transportation;Supervision due to cognitive status   Can travel by private vehicle     No  Equipment Recommendations  Wheelchair cushion (measurements PT);Hospital bed;Wheelchair (measurements PT)    Recommendations for Other Services       Precautions / Restrictions Precautions Precautions: Fall;Other (comment) Recall of Precautions/Restrictions: Impaired Precaution/Restrictions Comments: watch sats, poor reserve     Mobility  Bed Mobility Overal bed mobility: Needs Assistance Bed Mobility: Supine to Sit, Sit to Supine     Supine to sit: Min assist, HOB elevated, Used rails     General bed mobility comments: min assist with use of rail and HOB 40 degrees  to pivot to rt side of bed. mod assist to lift legs and return to supine. Max +2 to slide toward [facility]    Transfers                   General transfer comment: unable to tolerate as pt with desaturation requiring 8L EOB    Ambulation/Gait                   Stairs             Wheelchair Mobility     Tilt Bed    Modified Rankin (Stroke Patients Only)       Balance Overall balance assessment: Needs assistance Sitting-balance support: Feet supported, Bilateral upper extremity supported Sitting balance-Leahy Scale: Poor Sitting balance - Comments: guarding EOB after initial min assist to achieve sitting                                    Communication Communication Communication: Impaired Factors Affecting Communication: Hearing impaired  Cognition Arousal: Alert Behavior During Therapy: WFL for tasks assessed/performed   PT - Cognitive impairments: History of cognitive impairments, Safety/Judgement, Problem solving, Memory                         Following commands: Intact Following commands impaired: Follows one step commands with increased time, Follows one step commands inconsistently    Cueing Cueing Techniques: Verbal cues, Gestural cues  Exercises General Exercises - Lower Extremity Heel Slides: AROM,  Both, 10 reps, Seated, Strengthening Hip ABduction/ADduction: AROM, Seated, Both, 10 reps, Strengthening    General Comments        Pertinent Vitals/Pain Pain Assessment Pain Assessment: No/denies pain    Home Living                          Prior Function            PT Goals (current goals can now be found in the care plan section) Progress towards PT goals: Not progressing toward goals - comment (limited pulmonary reserve)    Frequency    Min 1X/week      PT Plan      Co-evaluation              AM-PAC PT 6 Clicks Mobility   Outcome Measure  Help needed turning from your  back to your side while in a flat bed without using bedrails?: A Little Help needed moving from lying on your back to sitting on the side of a flat bed without using bedrails?: A Lot Help needed moving to and from a bed to a chair (including a wheelchair)?: Total Help needed standing up from a chair using your arms (e.g., wheelchair or bedside chair)?: Total Help needed to walk in hospital room?: Total Help needed climbing 3-5 steps with a railing? : Total 6 Click Score: 9    End of Session   Activity Tolerance: Treatment limited secondary to medical complications (Comment) Patient left: in bed;with call bell/phone within reach;with family/visitor present Nurse Communication: Mobility status;Need for lift equipment PT Visit Diagnosis: Other abnormalities of gait and mobility (R26.89);Difficulty in walking, not elsewhere classified (R26.2);Muscle weakness (generalized) (M62.81)     Time: 8877-8852 PT Time Calculation (min) (ACUTE ONLY): 25 min  Charges:    $Therapeutic Exercise: 8-22 mins $Therapeutic Activity: 8-22 mins PT General Charges $$ ACUTE PT VISIT: 1 Visit                     Lenoard SQUIBB, PT Acute Rehabilitation Services Office: [phone]    Lenoard NOVAK Kabella Cassidy 10/26/2023, 12:21 PM

## 2023-10-26 NOTE — TOC Progression Note (Signed)
 Transition of Care [facility]) - Progression Note    Patient Details  Name: Megan Pitts MRN: [id number] Date of Birth: June 24, 1927  [facility] of [facility]) CM/SW Contact  Luise JAYSON Pan, [place] Phone Number: 10/26/2023, 11:55 AM  Clinical Narrative:   Per MD, plan to keep patient through the weekend. [facility] and [employer].   CSW will continue to follow.    Expected Discharge Plan: Skilled Nursing Facility Barriers to Discharge: Continued Medical Work up               Expected Discharge Plan and Services In-house Referral: Clinical Social Work, Hospice / Palliative Care Discharge Planning Services: CM Consult   Living arrangements for the past 2 months: Single Family Home                                       Social Drivers of Health (SDOH) Interventions SDOH Screenings   Food Insecurity: No Food Insecurity (09/07/2023)   Received from [facility]  Housing: Patient Declined (10/19/2023)  Transportation Needs: Patient Declined (10/19/2023)  Recent Concern: Transportation Needs - Unmet Transportation Needs (09/07/2023)   Received from [facility]  Utilities: Patient Declined (10/19/2023)  Alcohol  Screen: Low Risk  (06/30/2019)  Depression (PHQ2-9): Low Risk  (12/08/2020)  Financial Resource Strain: Low Risk  (09/07/2023)   Received from [facility]  Physical Activity: Inactive (06/30/2019)  Social Connections: Patient Declined (10/19/2023)  Recent Concern: Social Connections - Moderately Isolated (09/07/2023)   Received from [facility]  Stress: No Stress Concern Present (10/08/2023)   Received from [facility]  Tobacco Use: Low Risk  (10/18/2023)    Readmission Risk Interventions    09/04/2023    3:03 PM  Readmission Risk Prevention Plan  Transportation Screening Complete  PCP or Specialist Appt within 5-7 Days Complete  Home Care Screening Complete  Medication Review (RN CM) Complete

## 2023-10-26 NOTE — Progress Notes (Signed)
 Progress Note   Patient: Megan Pitts FMW:[id number] DOB: [birth year] DOA: 10/18/2023     8 DOS: the patient was seen and examined on 10/26/2023   Brief hospital course: Megan Pitts was admitted to the hospital with the working diagnosis of worsening respiratory failure   89 y.o. female with medical history significant for hypertension, hyperlipidemia, heart failure, dementia, hypothyroidism, atrial fibrillation, Recent hospitalization 8/25 to 8/28 for multifocal pneumonia, she was discharged on antibiotic therapy and steroids,  Now presenting from her SNF for worsening shortness of breath and increased oxygen requirement.  CT showed pulm fibrosis and she is needing high flow oxygen. Palliative consulted.   Patient placed on antibiotics, steroids and diuretics.  Continue to have high 02 requirements.   10/17 continue to require high flow oxygen with minimal movement.    Assessment and Plan: Multifocal pneumonia Acute on chronic hypoxemic respiratory failure.  Suspected ILD   CT chest with bilateral ground glass opacities, interlobular and intralobular septal thickening, traction bronchiectasis, more upper lobes.   At rest patient on 4 L/min per [place], but with minimal movement will require higher 02 flow up to 8 L/min   Continue with IV methylprednisolone  60 mg IV bid and inhaled steroids.  Bronchodilator therapy  Airway clearing techniques.  Antibiotic therapy with azithromycin  and ceftriaxone .  Diuresis as tolerated   Poor prognosis, discussed with Pulmonary Follow up on serologies   Chronic diastolic CHF (congestive heart failure) (HCC) Echocardiogram with preserved LV systolic function with EF 60 to 65%, mild LVH, grade II diastolic dysfunction with pseudo normalization EA, RVSP 52.1 mmHg, mild mitral valve regurgitation, moderate mitral valve stenosis, mild to moderate tricuspid valve regurgitation, sp bioprosthetic valve replacement.   Urine output documented is 450 ml   Systolic blood pressure 150 mmHg   Continue furosemide , and irbesartan  Keep negative fluid balance   Paroxysmal atrial fibrillation (HCC) Continue rate control with diltiazem and metoprolol anticoagulation with apixaban,   Essential hypertension Continue blood pressure control with amlodipine ,   CKD stage 3a, GFR 45-59 ml/min (HCC) Renal function with serum cr at 1.0 with K at 3,7 and serum bicarbonate at 26  Na 140   Keep negative fluid balance with furosemide  Continue close follow up renal function and electrolytes.  Add 40 Kcl to avoid hypokalemia   Hypothyroidism Continue levothyroxine          Subjective: Patient continue to have dyspnea, worse with minimal movement, no chest pain and no cough   Physical Exam: Vitals:   10/26/23 0330 10/26/23 0715 10/26/23 0900 10/26/23 1217  BP: (!) 152/45 (!) 167/52    Pulse: 62 (!) 50 73   Resp: (!) 21 20 (!) 30   Temp: 98 F (36.7 C) 98.1 F (36.7 C)    TempSrc: Oral Oral    SpO2: 93% 90% 93% (!) 78%  Weight:      Height:       Neurology awake and alert ENT with mild pallor Cardiovascular with S1 and S2 present and regular with no gallops or rubs, positive systolic murmur at the apex Respiratory with increased work of breathing and use of neck accessory respiratory muscles, bilateral rales with scattered rhonchi with no wheezing Abdomen with no distention  No lower extremity edema   Data Reviewed:    Family Communication: I spoke with patient's daughter at the bedside, we talked in detail about patient's condition, plan of care and prognosis and all questions were addressed.   Disposition: Status is: Inpatient Remains inpatient appropriate because: respiratory  failure   Planned Discharge Destination: Skilled nursing facility     Author: Elidia Toribio Furnace, MD 10/26/2023 12:17 PM  For on call review [email or web address].

## 2023-10-26 NOTE — Progress Notes (Signed)
 [facility] Liaison Note   [facility] continues to follow for discharge disposition for outpatient palliative services at home.     Please call with any palliative and hospice related questions or concerns.   Eleanor Nail, LPN [facility] Liaison [phone]

## 2023-10-26 NOTE — Progress Notes (Signed)
 NAME:  Megan Pitts, MRN:  [id number], DOB:  [birth year], LOS: 8 ADMISSION DATE:  10/18/2023, CONSULTATION DATE:  10/25/2023 REFERRING MD:  Dr Toribio, CHIEF COMPLAINT:  ILD   History of Present Illness:  Megan Pitts is a 89 year old female with history of chronic heart failure with preserved EF, dementia, hypothyroidism, A-fib, aortic stenosis status post AVR, CKD subacute hypoxic respiratory failure since August/2025, who is admitted to floor from nursing home for worsening shortness of breath and increasing oxygen requirements.  She was admitted to the hospital in August/2025 for community-acquired pneumonia.  [facility]  She has ongoing oxygen needs since then Reports shortness of breath with minimal exertion. Denies wheezing, fever or chills Reports cough with occasional sputum production  She was admitted to this hospital on 10/09.  Admission labs were concerning for heart failure exacerbation with BNP of 411, leukocytosis of 18, with left shift.  Microbiologic workup so far is negative.  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     Interim History / Subjective:   Reports shortness of breath. Reports it got worse when her oxygen was weaned earlier   Objective   Blood pressure (!) 167/52, pulse 73, temperature 98.1 F (36.7 C), temperature source Oral, resp. rate (!) 30, height 5' 3 (1.6 m), weight 76.7 kg, SpO2  (!) 78%.        Intake/Output Summary (Last 24 hours) at 10/26/2023 1218 Last data filed at 10/26/2023 0845 Gross per 24 hour  Intake 1054 ml  Output 450 ml  Net 604 ml   Filed Weights   10/24/23 0550 10/25/23 0348 10/26/23 0326  Weight: 70.8 kg 75.7 kg 76.7 kg    Examination: General:  Elderly lady, frail Hard of hearing Diffuse crackles bilateral lungs No distinct pedal edema  Lab/imaging review:  Reviewed chest x-rays and admission notes from August/2025 and this admission Reviewed CT images 10/2023    Assessment & Plan:   Acute on chronic hypoxic respiratory failure ILD Acute on chronic heart failure with preserved ejection fraction CKD Chronic anemia History of A-fib  - continue antibiotics. Comprehensive respi panel negative - spoke to the family who is concerned about her higher oxygen needs going to a facility. Would recommend case management looking into options to have 10L oxygen available at d/c if possible - will send autoimmune labs to explore rheumatologic causes of ILD- results pending - pt has a hx of Afib, dont see amiodarone on her med list. Family mentioned recent covid infection but pt wasnot acutely sick at that time, so doesn't seem to be post covid fibrosis - she has underlying ILD and superimposed shadows which could be infiltrate, edema, inflammatory flare up - agree with empiric steroids as pt and family are against aggressive procedures. Weaned down steroids some today - I discussed the role of diuretics and benefit with respi failure in CHF pt and risk of kidney damage. Family understands. In order to improve her oxygenation, I would recommend increasing her  diuretics (pt on 40mg  lasix  since 10/10)- even if affects her kidney function somewhat as pt is interested in palliative care but not ready for hospice yet Recommend more aggressive diuresis as tolerated     Labs   CBC: Recent Labs  Lab 10/20/23 0300 10/21/23 0224 10/22/23 0301  10/25/23 0249  WBC 13.9* 14.5* 13.8* 15.1*  HGB 10.6* 9.2* 8.8* 9.7*  HCT 33.0* 28.2* 27.2* 29.7*  MCV 87.3 87.3 86.9 87.6  PLT 227 210 220 275    Basic Metabolic Panel: Recent Labs  Lab 10/21/23 0224 10/22/23 0301 10/24/23 1821 10/25/23 0249 10/26/23 0246  NA 137 136 138 140 140  K 3.7 3.6 3.9 4.0 3.7  CL 100 100 98 100 100  CO2 26 26 24 27 26   GLUCOSE 105* 104* 271* 159* 177*  BUN 29* 28* 27* 24* 34*  CREATININE 0.92 1.07* 1.15* 0.94 1.00  CALCIUM  8.5* 8.4* 8.8* 8.7* 8.7*   GFR: Estimated Creatinine Clearance: 34.5 mL/min (by C-G formula based on SCr of 1 mg/dL). Recent Labs  Lab 10/20/23 0300 10/21/23 0224 10/22/23 0301 10/25/23 0249  WBC 13.9* 14.5* 13.8* 15.1*    Liver Function Tests: No results for input(s): AST, ALT, ALKPHOS, BILITOT, PROT, ALBUMIN in the last 168 hours.  No results for input(s): LIPASE, AMYLASE in the last 168 hours. No results for input(s): AMMONIA in the last 168 hours.  ABG    Component Value Date/Time   HCO3 26.5 10/18/2023 2021   TCO2 28 10/18/2023 2021   TCO2 27 10/18/2023 2021   ACIDBASEDEF 3.6 (H) 09/05/2023 0950   O2SAT 99 10/18/2023 2021     Coagulation Profile: No results for input(s): INR, PROTIME in the last 168 hours.   Cardiac Enzymes: No results for input(s): CKTOTAL, CKMB, CKMBINDEX, TROPONINI in the last 168 hours.  HbA1C: No results found for: HGBA1C  CBG: No results for input(s): GLUCAP in the last 168 hours.  Review of Systems:   Negative except mentioned in HPI  Past Medical History:  She,  has a past medical history of A-fib [facility]), Aortic stenosis, Carotid bruit (09/2008), GERD (gastroesophageal reflux disease), Gilbert's syndrome, Hyperlipidemia, Hypertension, IFG (impaired fasting glucose), OAB (overactive bladder), Osteopenia, Phlebitis, PVC's (premature ventricular contractions), Shingles, Varicose veins, and Vertigo.   Surgical History:   Past Surgical  History:  Procedure Laterality Date   ABDOMINAL HYSTERECTOMY  1963   partial   AORTIC VALVE REPLACEMENT  08/01/2005   WITH A #23MM TORONTO STENTLESS PORCINE AORTIC VALVE   CARDIAC CATHETERIZATION  07/25/2005   EF 60% THE AORTIC VALVE IS HEAVILY CALCIFIED WITH REDUCED OPENING. NO MVP   FOOT SURGERY     HAND SURGERY     HEMORRHOID SURGERY  1980's   LUMBAR LAMINECTOMY/DECOMPRESSION MICRODISCECTOMY N/A 06/15/2023   Procedure: LUMBAR LAMINECTOMY MICRODISCECTOMY LUMBAR TWO-THREE;  Surgeon: Gillie Duncans, MD;  Location: [facility] OR;  Service: Neurosurgery;  Laterality: N/A;  L2-3 Lumbar Discectomy   ORIF ANKLE FRACTURE Right 09/11/2020   Procedure: OPEN REDUCTION INTERNAL FIXATION (ORIF) ANKLE FRACTURE;  Surgeon: Cristy Bonner DASEN, MD;  Location: [facility] OR;  Service: Orthopedics;  Laterality: Right;   US  ECHOCARDIOGRAPHY  04/14/2008   EF 55-60%. NORMAL   US  ECHOCARDIOGRAPHY  11/21/2005   EF 55-60%   US  ECHOCARDIOGRAPHY  07/20/2005   EF 55-60%   US  ECHOCARDIOGRAPHY  10/19/2004   EF 55-60%   VARICOSE VEIN SURGERY  2010   laser treatment     Social History:   reports that she has never smoked. She  has never used smokeless tobacco. She reports that she does not drink alcohol  and does not use drugs.   Family History:  Her family history includes Alcohol  abuse in her brother; Alzheimer's disease in her father; Leukemia in her brother; Pneumonia (age of onset: 33) in her mother.   Allergies Allergies  Allergen Reactions   Levothyroxine  Other (See Comments)    Unknown reaction per SNF   Tramadol     Unknown reaction per SNF     Home Medications  Prior to Admission medications   Medication Sig Start Date End Date Taking? Authorizing Provider  Acidophilus Lactobacillus CAPS Take 1 tablet by mouth in the morning and at bedtime. 10/08/23  Yes [provider]  apixaban (ELIQUIS) 2.5 MG TABS tablet Take 2.5 mg by mouth 2 (two) times daily.   Yes [provider]  budesonide  (PULMICORT ) 0.25 MG/2ML  nebulizer solution Take 2 mLs (0.25 mg total) by nebulization 2 (two) times daily. 09/06/23  Yes Drusilla Sabas RAMAN, MD  diltiazem (CARDIZEM CD) 120 MG 24 hr capsule Take 120 mg by mouth in the morning.   Yes [provider]  donepezil  (ARICEPT ) 10 MG tablet Take 10 mg by mouth at bedtime.  01/11/15  Yes [provider]  furosemide  (LASIX ) 40 MG tablet Take 1 tablet (40 mg total) by mouth daily. 09/06/23 09/05/24 Yes Drusilla Sabas RAMAN, MD  hydrALAZINE  (APRESOLINE ) 50 MG tablet Take 50 mg by mouth every 8 (eight) hours. 10/08/23  Yes [provider]  ipratropium-albuterol  (DUONEB) 0.5-2.5 (3) MG/3ML SOLN Take 3 mLs by nebulization in the morning, at noon, and at bedtime.   Yes [provider]  levothyroxine  (SYNTHROID ) 50 MCG tablet Take 50 mcg by mouth daily. 09/18/22  Yes [provider]  losartan (COZAAR) 100 MG tablet Take 100 mg by mouth daily.   Yes [provider]  metoprolol tartrate (LOPRESSOR) 50 MG tablet Take 50 mg by mouth 2 (two) times daily.   Yes [provider]  pantoprazole (PROTONIX) 40 MG tablet Take 40 mg by mouth daily.   Yes [provider]  polyethylene glycol (MIRALAX  / GLYCOLAX ) 17 g packet Take 17 g by mouth daily.   Yes [provider]  predniSONE (DELTASONE) 20 MG tablet Take 20 mg by mouth daily with breakfast.   Yes [provider]  senna (SENOKOT) 8.6 MG TABS tablet Take 1 tablet by mouth in the morning and at bedtime.   Yes [provider]  amLODipine  (NORVASC ) 10 MG tablet Take 10 mg by mouth daily. Patient not taking: Reported on 10/18/2023 03/19/19   [provider]  aspirin  EC 81 MG tablet Take 81 mg by mouth daily. Swallow whole. Patient not taking: Reported on 10/18/2023    [provider]  guaiFENesin -dextromethorphan  (ROBITUSSIN DM) 100-10 MG/5ML syrup Take 5 mLs by mouth every 4 (four) hours as needed for cough. Patient not taking: Reported on 10/18/2023 09/06/23    Drusilla Sabas RAMAN, MD  ipratropium (ATROVENT ) 0.02 % nebulizer solution Take 2.5 mLs (0.5 mg total) by nebulization every 6 (six) hours. Patient not taking: Reported on 10/18/2023 09/06/23   Drusilla Sabas RAMAN, MD  levalbuterol  (XOPENEX ) 0.63 MG/3ML nebulizer solution Take 3 mLs (0.63 mg total) by nebulization every 6 (six) hours. Patient not taking: Reported on 10/18/2023 09/06/23   Drusilla Sabas RAMAN, MD  olmesartan (BENICAR) 40 MG tablet Take 40 mg by mouth daily. Patient not taking: Reported on 10/18/2023    [provider]  Sabri Teal Pleas, MD 10/26/23 12:18 PM [facility]  For contact information, see Amion. If no response to pager, please call PCCM consult pager. After hours, 7PM- 7AM, please call Elink.

## 2023-10-27 ENCOUNTER — Inpatient Hospital Stay (HOSPITAL_COMMUNITY)

## 2023-10-27 DIAGNOSIS — I480 Paroxysmal atrial fibrillation: Secondary | ICD-10-CM | POA: Diagnosis not present

## 2023-10-27 DIAGNOSIS — J188 Other pneumonia, unspecified organism: Secondary | ICD-10-CM | POA: Diagnosis not present

## 2023-10-27 DIAGNOSIS — R918 Other nonspecific abnormal finding of lung field: Secondary | ICD-10-CM | POA: Diagnosis not present

## 2023-10-27 DIAGNOSIS — I5033 Acute on chronic diastolic (congestive) heart failure: Secondary | ICD-10-CM | POA: Diagnosis not present

## 2023-10-27 DIAGNOSIS — I10 Essential (primary) hypertension: Secondary | ICD-10-CM | POA: Diagnosis not present

## 2023-10-27 LAB — RENAL FUNCTION PANEL
Albumin: 2.6 g/dL — ABNORMAL LOW (ref 3.5–5.0)
Anion gap: 15 (ref 5–15)
BUN: 52 mg/dL — ABNORMAL HIGH (ref 8–23)
CO2: 25 mmol/L (ref 22–32)
Calcium: 8.9 mg/dL (ref 8.9–10.3)
Chloride: 102 mmol/L (ref 98–111)
Creatinine, Ser: 1.05 mg/dL — ABNORMAL HIGH (ref 0.44–1.00)
GFR, Estimated: 50 mL/min — ABNORMAL LOW (ref >60)
Glucose, Bld: 179 mg/dL — ABNORMAL HIGH (ref 70–99)
Phosphorus: 3.8 mg/dL (ref 2.5–4.6)
Potassium: 4 mmol/L (ref 3.5–5.1)
Sodium: 142 mmol/L (ref 135–145)

## 2023-10-27 MED ORDER — MORPHINE SULFATE (PF) 2 MG/ML IV SOLN
0.5000 mg | Freq: Once | INTRAVENOUS | Status: AC
  Administered 2023-10-27: 0.5 mg via INTRAVENOUS
  Filled 2023-10-27: qty 1

## 2023-10-27 MED ORDER — FUROSEMIDE 10 MG/ML IJ SOLN
60.0000 mg | Freq: Once | INTRAMUSCULAR | Status: AC
  Administered 2023-10-27: 60 mg via INTRAVENOUS
  Filled 2023-10-27: qty 6

## 2023-10-27 NOTE — Progress Notes (Signed)
 RT note. Patient placed on HHFNC per MD request. Patient currently on 50L-90% sat 91. Labored breathing is still noted, patient is also a mouth breather. RT will continue to monitor.    10/27/23 1053  Therapy Vitals  Pulse Rate 65  Resp (!) 30  MEWS Score/Color  MEWS Score 2  MEWS Score Color Yellow  Respiratory Assessment  Assessment Type Assess only  Respiratory Pattern Regular;Labored;Pursed lips  Chest Assessment Chest expansion symmetrical  Bilateral Breath Sounds Expiratory wheezes;Diminished  Oxygen Therapy/Pulse Ox  SpO2 91 %  O2 Device (S)  HHFNC  O2 Therapy Oxygen humidified  O2 Flow Rate (L/min) 50 L/min (50L- 90%)  FiO2 (%) 90 %

## 2023-10-27 NOTE — Progress Notes (Signed)
 NAME:  Megan Pitts, MRN:  [id number], DOB:  1927-05-21, LOS: 9 ADMISSION DATE:  10/18/2023, CONSULTATION DATE:  10/25/2023 REFERRING MD:  Dr Toribio, CHIEF COMPLAINT:  ILD   History of Present Illness:  Megan Pitts is a 89 year old female with history of chronic heart failure with preserved EF, dementia, hypothyroidism, A-fib, aortic stenosis status post AVR, CKD subacute hypoxic respiratory failure since August/2025, who is admitted to floor from nursing home for worsening shortness of breath and increasing oxygen requirements.  She was admitted to the hospital in August/2025 for community-acquired pneumonia.  [facility]  She has ongoing oxygen needs since then Reports shortness of breath with minimal exertion. Denies wheezing, fever or chills Reports cough with occasional sputum production  She was admitted to this hospital on 10/09.  Admission labs were concerning for heart failure exacerbation with BNP of 411, leukocytosis of 18, with left shift.  Microbiologic workup so far is negative.  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     Interim History / Subjective:   Reports shortness of breath  Worse today Megan Pitts on high flow and is tachypnic    Objective   Blood pressure (!) 184/53, pulse 68, temperature (!) 97.5 F (36.4 C), temperature source Oral, resp. rate (!) 37, height 5' 3 (1.6 m), weight 75.3 kg, SpO2  97%.    FiO2 (%):  [90 %-100 %] 100 %   Intake/Output Summary (Last 24 hours) at 10/27/2023 1534 Last data filed at 10/27/2023 1000 Gross per 24 hour  Intake 237 ml  Output 500 ml  Net -263 ml   Filed Weights   10/25/23 0348 10/26/23 0326 10/27/23 0313  Weight: 75.7 kg 76.7 kg 75.3 kg    Examination: General:  Elderly lady, frail Hard of hearing Visibly dysnic Diffuse crackles bilateral lungs No distinct pedal edema  Lab/imaging review:  Reviewed chest x-rays and admission notes from August/2025 and this admission Reviewed CT images 10/2023    Assessment & Plan:   Acute on chronic hypoxic respiratory failure ILD Acute on chronic heart failure with preserved ejection fraction CKD Chronic anemia History of A-fib  - Comprehensive respi panel negative - will send autoimmune labs to explore rheumatologic causes of ILD- results pending - Megan Pitts has a hx of Afib, dont see amiodarone on her med list. Family mentioned recent covid infection but Megan Pitts wasnot acutely sick at that time, so doesn't seem to be post covid fibrosis - she has underlying ILD and superimposed shadows which could be infiltrate, edema, inflammatory flare up - agree with empiric steroids as Megan Pitts and family are against aggressive procedures.  - I discussed the role of diuretics and benefit with respi failure in CHF Megan Pitts and risk of kidney damage. Family understands. In order to improve her oxygenation, I would recommend increasing her diuretics (Megan Pitts on 40mg  lasix  since 10/10)- even if affects her kidney function somewhat as Megan Pitts is interested in palliative care but not ready for hospice yet  Recommend more  aggressive diuresis as tolerated. Most likely pulm edema. Pneumonia is a differential but if she has pneumonia, unlikely to get better without more aggressive care. Megan Pitts is considering comfort care tomorrow. If she gets dyspnic tonight, can utilize another dose of lasix   D/w Dr Arrien. Please call us  if  needed    Labs   CBC: Recent Labs  Lab 10/21/23 0224 10/22/23 0301 10/25/23 0249  WBC 14.5* 13.8* 15.1*  HGB 9.2* 8.8* 9.7*  HCT 28.2* 27.2* 29.7*  MCV 87.3 86.9 87.6  PLT 210 220 275    Basic Metabolic Panel: Recent Labs  Lab 10/22/23 0301 10/24/23 1821 10/25/23 0249 10/26/23 0246 10/27/23 0303  NA 136 138 140 140 142  K 3.6 3.9 4.0 3.7 4.0  CL 100 98 100 100 102  CO2 26 24 27 26 25   GLUCOSE 104* 271* 159* 177* 179*  BUN 28* 27* 24* 34* 52*  CREATININE 1.07* 1.15* 0.94 1.00 1.05*  CALCIUM  8.4* 8.8* 8.7* 8.7* 8.9  PHOS  --   --   --   --  3.8   GFR: Estimated Creatinine Clearance: 32.6 mL/min (A) (by C-G formula based on SCr of 1.05 mg/dL (H)). Recent Labs  Lab 10/21/23 0224 10/22/23 0301 10/25/23 0249  WBC 14.5* 13.8* 15.1*    Liver Function Tests: Recent Labs  Lab 10/27/23 0303  ALBUMIN 2.6*    No results for input(s): LIPASE, AMYLASE in the last 168 hours. No results for input(s): AMMONIA in the last 168 hours.  ABG    Component Value Date/Time   HCO3 26.5 10/18/2023 2021   TCO2 28 10/18/2023 2021   TCO2 27 10/18/2023 2021   ACIDBASEDEF 3.6 (H) 09/05/2023 0950   O2SAT 99 10/18/2023 2021     Coagulation Profile: No results for input(s): INR, PROTIME in the last 168 hours.   Cardiac Enzymes: No results for input(s): CKTOTAL, CKMB, CKMBINDEX, TROPONINI in the last 168 hours.  HbA1C: No results found for: HGBA1C  CBG: No results for input(s): GLUCAP in the last 168 hours.  Review of Systems:   Negative except mentioned in HPI  Past Medical History:  She,  has a past medical history of A-fib [facility]), Aortic stenosis, Carotid bruit (09/2008), GERD (gastroesophageal reflux disease), Gilbert's syndrome, Hyperlipidemia, Hypertension, IFG (impaired fasting glucose), OAB (overactive bladder), Osteopenia, Phlebitis, PVC's (premature ventricular contractions), Shingles, Varicose veins, and Vertigo.   Surgical History:    Past Surgical History:  Procedure Laterality Date   ABDOMINAL HYSTERECTOMY  1963   partial   AORTIC VALVE REPLACEMENT  08/01/2005   WITH A #23MM TORONTO STENTLESS PORCINE AORTIC VALVE   CARDIAC CATHETERIZATION  07/25/2005   EF 60% THE AORTIC VALVE IS HEAVILY CALCIFIED WITH REDUCED OPENING. NO MVP   FOOT SURGERY     HAND SURGERY     HEMORRHOID SURGERY  1980's   LUMBAR LAMINECTOMY/DECOMPRESSION MICRODISCECTOMY N/A 06/15/2023   Procedure: LUMBAR LAMINECTOMY MICRODISCECTOMY LUMBAR TWO-THREE;  Surgeon: Gillie Duncans, MD;  Location: [facility] OR;  Service: Neurosurgery;  Laterality: N/A;  L2-3 Lumbar Discectomy   ORIF ANKLE FRACTURE Right 09/11/2020   Procedure: OPEN REDUCTION INTERNAL FIXATION (ORIF) ANKLE FRACTURE;  Surgeon: Cristy Bonner DASEN, MD;  Location: [facility] OR;  Service: Orthopedics;  Laterality: Right;   US  ECHOCARDIOGRAPHY  04/14/2008   EF 55-60%. NORMAL   US  ECHOCARDIOGRAPHY  11/21/2005   EF 55-60%   US  ECHOCARDIOGRAPHY  07/20/2005   EF 55-60%   US  ECHOCARDIOGRAPHY  10/19/2004   EF 55-60%   VARICOSE VEIN SURGERY  2010   laser treatment     Social History:   reports that she has never smoked. She has never used smokeless tobacco. She reports that she does not drink alcohol  and does not use drugs.   Family History:  Her family history includes Alcohol  abuse in her brother; Alzheimer's disease in her father; Leukemia in her brother; Pneumonia (age of onset: 10) in her mother.   Allergies Allergies  Allergen Reactions   Levothyroxine  Other (See Comments)    Unknown reaction per SNF   Tramadol     Unknown reaction per SNF     Home Medications  Prior to Admission medications   Medication Sig Start Date End Date Taking? Authorizing Provider  Acidophilus Lactobacillus CAPS Take 1 tablet by mouth in the morning and at bedtime. 10/08/23  Yes [provider]  apixaban (ELIQUIS) 2.5 MG TABS tablet Take 2.5 mg by mouth 2 (two) times daily.   Yes [provider]  budesonide   (PULMICORT ) 0.25 MG/2ML nebulizer solution Take 2 mLs (0.25 mg total) by nebulization 2 (two) times daily. 09/06/23  Yes Drusilla Sabas RAMAN, MD  diltiazem (CARDIZEM CD) 120 MG 24 hr capsule Take 120 mg by mouth in the morning.   Yes [provider]  donepezil  (ARICEPT ) 10 MG tablet Take 10 mg by mouth at bedtime.  01/11/15  Yes [provider]  furosemide  (LASIX ) 40 MG tablet Take 1 tablet (40 mg total) by mouth daily. 09/06/23 09/05/24 Yes Drusilla Sabas RAMAN, MD  hydrALAZINE  (APRESOLINE ) 50 MG tablet Take 50 mg by mouth every 8 (eight) hours. 10/08/23  Yes [provider]  ipratropium-albuterol  (DUONEB) 0.5-2.5 (3) MG/3ML SOLN Take 3 mLs by nebulization in the morning, at noon, and at bedtime.   Yes [provider]  levothyroxine  (SYNTHROID ) 50 MCG tablet Take 50 mcg by mouth daily. 09/18/22  Yes [provider]  losartan (COZAAR) 100 MG tablet Take 100 mg by mouth daily.   Yes [provider]  metoprolol tartrate (LOPRESSOR) 50 MG tablet Take 50 mg by mouth 2 (two) times daily.   Yes [provider]  pantoprazole (PROTONIX) 40 MG tablet Take 40 mg by mouth daily.   Yes [provider]  polyethylene glycol (MIRALAX  / GLYCOLAX ) 17 g packet Take 17 g by mouth daily.   Yes [provider]  predniSONE (DELTASONE) 20 MG tablet Take 20 mg by mouth daily with breakfast.   Yes [provider]  senna (SENOKOT) 8.6 MG TABS tablet Take 1 tablet by mouth in the morning and at bedtime.   Yes [provider]  amLODipine  (NORVASC ) 10 MG tablet Take 10 mg by mouth daily. Patient not taking: Reported on 10/18/2023 03/19/19   [provider]  aspirin  EC 81 MG tablet Take 81 mg by mouth daily. Swallow whole. Patient not taking: Reported on 10/18/2023    [provider]  guaiFENesin -dextromethorphan  (ROBITUSSIN DM) 100-10 MG/5ML syrup Take 5 mLs by mouth every 4 (four) hours as needed for cough. Patient not taking:  Reported on 10/18/2023 09/06/23   Drusilla Sabas RAMAN, MD  ipratropium (ATROVENT ) 0.02 % nebulizer solution Take 2.5 mLs (0.5 mg total) by nebulization every 6 (six) hours. Patient not taking: Reported on 10/18/2023 09/06/23   Drusilla Sabas RAMAN, MD  levalbuterol  (XOPENEX ) 0.63 MG/3ML nebulizer solution Take 3 mLs (0.63 mg total) by nebulization every 6 (six) hours. Patient not taking: Reported on 10/18/2023 09/06/23   Drusilla Sabas RAMAN, MD  olmesartan (BENICAR) 40 MG tablet Take  40 mg by mouth daily. Patient not taking: Reported on 10/18/2023    [provider]     Kohana Amble Pleas, MD 10/27/23 3:34 PM [facility]  For contact information, see Amion. If no response to pager, please call PCCM consult pager. After hours, 7PM- 7AM, please call Elink.

## 2023-10-27 NOTE — IPAL (Addendum)
 I spoke with patient's daughter about Megan Pitts worsening respiratory failure, likely exacerbation of ILD with very high mortality and poor prognosis.  She is in agreement in continuing current treatment plan, with the priority of keeping patient comfortable and avoiding suffering. If she continues to deteriorate, will likely transition to full comfort care.  She confirmed do not intubate.

## 2023-10-27 NOTE — Progress Notes (Addendum)
 OT Cancellation Note  Patient Details Name: Megan Pitts MRN: [id number] DOB: [birth year]   Cancelled Treatment:    Reason Eval/Treat Not Completed: Medical issues which prohibited therapy.  RN reports respiratory changes overnight and earlier this morning, and requests holding off on OT treatment session today.  Will check back over wknd/1st of the week as pt. able.    CHRISTELLA Nest Lorraine-COTA/L  10/27/2023, 10:43 AM

## 2023-10-27 NOTE — Progress Notes (Signed)
 Progress Note   Patient: Megan Pitts FMW:[id number] DOB: 1927/02/15 DOA: 10/18/2023     9 DOS: the patient was seen and examined on 10/27/2023   Brief hospital course: Megan Pitts was admitted to the hospital with the working diagnosis of worsening respiratory failure   89 y.o. female with medical history significant for hypertension, hyperlipidemia, heart failure, dementia, hypothyroidism, atrial fibrillation, Recent hospitalization 8/25 to 8/28 for multifocal pneumonia, she was discharged to LTAC on antibiotic therapy and steroids. Patient required oxygenation with heated high flow nasal cannular up to 45l/min plus non re-breather mask. Eventually she was weaned off to 4 L/min per [place] and was transferred to SNF on 10/08/23.    Now presenting from her SNF for worsening dyspnea and increased oxygen requirement. Apparently she had another round of antibiotic therapy at SNF for pneumonia from 10/2 to 10/08.  Over the last 3 days prior to admission she had worsening dyspnea, her follow up chest radiograph has persistent infiltrates. On the day of admission her 02 saturation was 87% on 4 L.min per [place]. EMS was called, she was found in respiratory distress and patient was transported to the ED.   VBG 7.39/ 43/7/ 160/ 28/ 99%  Na 138, K 3.6 Cl 100 bicarbonate 23, glucose 152 bun 29 cr 1.21  BNP 411 Wbc 18,7 hgb 11.4 plt 276   Sars covid 19 negative Influenza negative RSV negative   Chest radiograph with mild cardiomegaly, bilateral interstitial infiltrates, upper lobes and lower lobes, peripherally.   EKG 80 bpm, normal axis, normal intervals, qtc 431, sinus rhythm with bilateral atrial enlargement, positive PAC, poor RR wave progression, no significant ST segment or T wave changes.   CT showed pulm fibrosis and she is needing high flow oxygen. Palliative consulted.   Patient placed on antibiotics, steroids and diuretics.  Continue to have high 02 requirements.   10/17 continue to require  high flow oxygen with minimal movement.  10/18 worsening respiratory failure, radiograph with worsening infiltrates.  10/19 continue to have worsening dyspnea and increased 02 requirements. Added heated high flow nasal cannula.   Assessment and Plan: Multifocal pneumonia Acute on chronic hypoxemic respiratory failure.  Suspected ILD   CT chest with bilateral ground glass opacities, interlobular and intralobular septal thickening, traction bronchiectasis, more upper lobes.  10/18 chest radiograph with bilateral interstitial infiltrates, more dense on the right upper lobe.  Today up on 15 L/min plus non re breather mask ([place])   Plan to continue with systemic steroids with IV methylprednisolone  60 mg IV bid Inhaled corticosteroids and Bronchodilators Airway clearing techniques.  Antibiotic therapy with azithromycin  and ceftriaxone .  Will give one dose of furosemide  60 mg IV x1  Transition to heated  high flow nasal cannula to keep 02 saturation 88% or greater.  Add one dose of morphine  0,5 mg for dyspnea.   Poor prognosis, discussed with Pulmonary Palliative care has been consulted.  Follow up on serologies   Acute on chronic diastolic CHF (congestive heart failure) (HCC) Echocardiogram with preserved LV systolic function with EF 60 to 65%, mild LVH, grade II diastolic dysfunction with pseudo normalization EA, RVSP 52.1 mmHg, mild mitral valve regurgitation, moderate mitral valve stenosis, mild to moderate tricuspid valve regurgitation, sp aortic bioprosthetic valve replacement.   Urine output documented is 350 ml  Systolic blood pressure 150 mmHg   Continue afterload reduction with irbesartan  Will increase furosemide  to 60 mg IV today   Paroxysmal atrial fibrillation (HCC) Continue rate control with diltiazem and metoprolol anticoagulation  with apixaban,   Essential hypertension Continue blood pressure control with amlodipine , and losartan   CKD stage 3a, GFR 45-59 ml/min  (HCC) Today renal function with serum cr at 1,0 with K at 4,0 and serum bicarbonate at 25  Na 142 and P 3.8   Continue diuresis with furosemide  IV  Follow up renal function and electrolytes.   Hypothyroidism Continue levothyroxine          Subjective: Patient with worsening dyspnea this morning, requiring increased 02 at rest, she is having anxiety and distress   Physical Exam: Vitals:   10/27/23 0313 10/27/23 0753 10/27/23 0822 10/27/23 0825  BP: (!) 152/43  (!) 197/62 (!) 197/62  Pulse: (!) 58   (!) 54  Resp: 20   20  Temp: 98.2 F (36.8 C)  (!) 97.5 F (36.4 C) (!) 97.5 F (36.4 C)  TempSrc: Oral  Oral Oral  SpO2: (!) 86% 92%  96%  Weight: 75.3 kg     Height:       Neurology awake and alert, deconditioned and ill looking appearing, positive anxiety ENT with positive pallor Cardiovascular with S1 and S2 present and tachycardic with no gallops or rubs, positive systolic murmur at the apex Respiratory with bilateral diffuse rales with no wheezing, scattered rhonchi.  Increased work of breathing with use of accessory respiratory muscles Abdomen with no distention, soft and non tender Positive lower extremity edema +  Data Reviewed:    Family Communication: no family at the bedside   Disposition: Status is: Inpatient Remains inpatient appropriate because: 02 support   Planned Discharge Destination: to be determined      Author: Elidia Toribio Furnace, MD 10/27/2023 10:09 AM  For on call review [email or web address].

## 2023-10-27 NOTE — Progress Notes (Signed)
 RT note. Upon arrival to patients room, patient was on 5 L HFNC sat between 80-86% with labored breathing and RR in 30's. Patient was turned up to 15L HFNC and NRB placed on at this time. Patient sat 99% with both, MD aware, RN aware. RT will continue to monitor.   10/27/23 0753  Aerosol Therapy Tx  $ Hand Held Nebulizer  1  Medications Pulmicort   Delivery Source Oxygen  Delivery Device HHN  Mid-Treatment Pulse 82  Post-Treatment Pulse 82  Post-Treatment Respirations 30  Treatment Tolerance Tolerated well  RT Breath Sounds  Bilateral Breath Sounds Expiratory wheezes;Clear;Diminished  Oxygen Therapy/Pulse Ox  SpO2 92 %  O2 Device HFNC  O2 Therapy Oxygen humidified  O2 Flow Rate (L/min) (S)  15 L/min  FiO2 (%) 92 %

## 2023-10-27 NOTE — Progress Notes (Signed)
 RT note. Patient flow increased from 50L to 60L patient was initially sat 86%, patient now sat 99%. RT will continue to monitor.    10/27/23 1328  Therapy Vitals  Pulse Rate 68  Resp (!) 37  MEWS Score/Color  MEWS Score 3  MEWS Score Color Yellow  Respiratory Assessment  Assessment Type Assess only  Respiratory Pattern Regular;Labored;Pursed lips  Chest Assessment Chest expansion symmetrical  Bilateral Breath Sounds Expiratory wheezes;Diminished  Oxygen Therapy/Pulse Ox  SpO2 97 %  O2 Device HHFNC  O2 Therapy Oxygen humidified  O2 Flow Rate (L/min) 60 L/min  FiO2 (%) 100 %  Heated High Flow Nasal Cannula  Adult Medium  Heater temperature 87.8 F (31 C)

## 2023-10-28 DIAGNOSIS — J9621 Acute and chronic respiratory failure with hypoxia: Secondary | ICD-10-CM | POA: Diagnosis not present

## 2023-10-28 DIAGNOSIS — I10 Essential (primary) hypertension: Secondary | ICD-10-CM | POA: Diagnosis not present

## 2023-10-28 DIAGNOSIS — I5033 Acute on chronic diastolic (congestive) heart failure: Secondary | ICD-10-CM | POA: Diagnosis not present

## 2023-10-28 DIAGNOSIS — Z7189 Other specified counseling: Secondary | ICD-10-CM | POA: Diagnosis not present

## 2023-10-28 DIAGNOSIS — I480 Paroxysmal atrial fibrillation: Secondary | ICD-10-CM | POA: Diagnosis not present

## 2023-10-28 DIAGNOSIS — J188 Other pneumonia, unspecified organism: Secondary | ICD-10-CM | POA: Diagnosis not present

## 2023-10-28 DIAGNOSIS — Z515 Encounter for palliative care: Secondary | ICD-10-CM | POA: Diagnosis not present

## 2023-10-28 LAB — SJOGRENS SYNDROME-B EXTRACTABLE NUCLEAR ANTIBODY: SSB (La) (ENA) Antibody, IgG: <0.2 AI (ref 0.0–0.9)

## 2023-10-28 LAB — ANA W/REFLEX IF POSITIVE: Anti Nuclear Antibody (ANA): NEGATIVE

## 2023-10-28 LAB — SJOGRENS SYNDROME-A EXTRACTABLE NUCLEAR ANTIBODY: SSA (Ro) (ENA) Antibody, IgG: 0.4 AI (ref 0.0–0.9)

## 2023-10-28 LAB — RHEUMATOID FACTOR: Rheumatoid fact SerPl-aCnc: 34 [IU]/mL — ABNORMAL HIGH (ref <14.0)

## 2023-10-28 LAB — ANTI-DNA ANTIBODY, DOUBLE-STRANDED: ds DNA Ab: 6 [IU]/mL (ref 0–9)

## 2023-10-28 MED ORDER — HYDROMORPHONE HCL 1 MG/ML IJ SOLN
0.5000 mg | INTRAMUSCULAR | Status: DC | PRN
  Administered 2023-10-28: 1 mg via INTRAVENOUS
  Filled 2023-10-28: qty 2

## 2023-10-28 MED ORDER — HALOPERIDOL LACTATE 2 MG/ML PO CONC: 2.0000 mg | Freq: Four times a day (QID) | ORAL | Status: DC | PRN

## 2023-10-28 MED ORDER — POLYVINYL ALCOHOL 1.4 % OP SOLN: 1.0000 [drp] | Freq: Four times a day (QID) | OPHTHALMIC | Status: DC | PRN

## 2023-10-28 MED ORDER — ONDANSETRON HCL 4 MG/2ML IJ SOLN: 4.0000 mg | Freq: Four times a day (QID) | INTRAMUSCULAR | Status: DC | PRN

## 2023-10-28 MED ORDER — GLYCOPYRROLATE 0.2 MG/ML IJ SOLN: 0.2000 mg | INTRAMUSCULAR | Status: DC | PRN

## 2023-10-28 MED ORDER — GLYCOPYRROLATE 1 MG PO TABS: 1.0000 mg | ORAL_TABLET | ORAL | Status: DC | PRN

## 2023-10-28 MED ORDER — MORPHINE BOLUS VIA INFUSION: 1.0000 mg | INTRAVENOUS | Status: DC | PRN

## 2023-10-28 MED ORDER — LORAZEPAM 2 MG/ML IJ SOLN
1.0000 mg | INTRAMUSCULAR | Status: DC | PRN
  Administered 2023-10-28 – 2023-10-29 (×2): 1 mg via INTRAVENOUS
  Filled 2023-10-28 (×2): qty 1

## 2023-10-28 MED ORDER — LORAZEPAM 1 MG PO TABS
1.0000 mg | ORAL_TABLET | ORAL | Status: DC | PRN
  Administered 2023-10-28: 1 mg via ORAL
  Filled 2023-10-28: qty 1

## 2023-10-28 MED ORDER — ACETAMINOPHEN 650 MG RE SUPP: 650.0000 mg | Freq: Four times a day (QID) | RECTAL | Status: DC | PRN

## 2023-10-28 MED ORDER — HALOPERIDOL 1 MG PO TABS: 2.0000 mg | ORAL_TABLET | Freq: Four times a day (QID) | ORAL | Status: DC | PRN

## 2023-10-28 MED ORDER — MORPHINE 100MG IN NS 100ML (1MG/ML) PREMIX INFUSION
1.0000 mg/h | INTRAVENOUS | Status: DC
  Administered 2023-10-28: 1 mg/h via INTRAVENOUS
  Filled 2023-10-28: qty 100

## 2023-10-28 MED ORDER — ACETAMINOPHEN 325 MG PO TABS: 650.0000 mg | ORAL_TABLET | Freq: Four times a day (QID) | ORAL | Status: DC | PRN

## 2023-10-28 MED ORDER — HALOPERIDOL LACTATE 5 MG/ML IJ SOLN
2.0000 mg | Freq: Four times a day (QID) | INTRAMUSCULAR | Status: DC | PRN
  Administered 2023-10-29: 2 mg via INTRAVENOUS
  Filled 2023-10-28: qty 1

## 2023-10-28 MED ORDER — MORPHINE SULFATE (PF) 2 MG/ML IV SOLN
0.5000 mg | Freq: Once | INTRAVENOUS | Status: AC
  Administered 2023-10-28: 0.5 mg via INTRAVENOUS
  Filled 2023-10-28: qty 1

## 2023-10-28 MED ORDER — LORAZEPAM 2 MG/ML PO CONC: 1.0000 mg | ORAL | Status: DC | PRN

## 2023-10-28 MED ORDER — OXYCODONE HCL 5 MG PO TABS: 5.0000 mg | ORAL_TABLET | ORAL | Status: DC | PRN

## 2023-10-28 MED ORDER — BISACODYL 10 MG RE SUPP: 10.0000 mg | Freq: Every day | RECTAL | Status: DC | PRN

## 2023-10-28 MED ORDER — BIOTENE DRY MOUTH MT LIQD: 15.0000 mL | Freq: Two times a day (BID) | OROMUCOSAL | Status: DC

## 2023-10-28 MED ORDER — DIPHENHYDRAMINE HCL 50 MG/ML IJ SOLN
25.0000 mg | INTRAMUSCULAR | Status: DC | PRN
  Administered 2023-10-29: 25 mg via INTRAVENOUS
  Filled 2023-10-28: qty 1

## 2023-10-28 MED ORDER — FUROSEMIDE 10 MG/ML IJ SOLN
60.0000 mg | Freq: Once | INTRAMUSCULAR | Status: AC
  Administered 2023-10-28: 60 mg via INTRAVENOUS
  Filled 2023-10-28: qty 6

## 2023-10-28 MED ORDER — ONDANSETRON 4 MG PO TBDP: 4.0000 mg | ORAL_TABLET | Freq: Four times a day (QID) | ORAL | Status: DC | PRN

## 2023-10-28 NOTE — Progress Notes (Signed)
 OT Cancellation Note  Patient Details Name: Megan Pitts MRN: [id number] DOB: [birth year]   Cancelled Treatment:    Reason Eval/Treat Not Completed: Medical issues which prohibited therapy.  Pt. is not medically appropriate for participation in OT secondary to progressing respiratory failure issues .  Will check in first of the week for further determination of our role in her care.    CHRISTELLA Nest Lorraine-COTA/L  10/28/2023, 8:23 AM

## 2023-10-28 NOTE — Progress Notes (Signed)
 Palliative Medicine Inpatient Follow Up Note   HPI: 89 y.o. female  with past medical history of hypertension, hyperlipidemia, chronic HFpEF, dementia, hypothyroidism, atrial fibrillation on Eliquis, and continued hypoxic respiratory failure since admission for pneumonia in August 2025  admitted on 10/18/2023 with worsening shortness of breath and increased oxygen requirement  .      PMT has been consulted to assist with goals of care conversation. Patient/Family face treatment option decisions, advanced directive decisions and anticipatory care needs.    Family face treatment option decision, advance directive decisions and anticipatory care needs.     Today's Discussion 10/28/2023  *Please note that this is a verbal dictation therefore any spelling or grammatical errors are due to the Dragon [employer] One system interpretation.  Chart reviewed inclusive of vital signs, progress notes, laboratory results, and diagnostic images.   This morning, I visited the patient at bedside. No family members were present at the time. The patient appeared to be in respiratory distress, tachypneic with a respiratory rate of 34 breaths per minute. She expressed difficulty catching her breath and described herself as "suffering." Even minimal movement caused her to feel breathless, and she reported that her condition has been worsening over the past few days without improvement. She shared that she feels tired and stated she is "ready to go to heaven."  I offered empathy and emotional support, affirming that her wishes are heard and will be respected. I explained that our goal is to honor her preferences by focusing on optimizing comfort and implementing measures to alleviate her suffering. She is currently on HHFNC at 60 L/min, with oxygen saturations ranging from the high 80s to low 90s. I observed desaturation into the 80s with minimal exertion despite HHFNC support.  Shortly afterward, I met with the  patient's daughter, Willy, and son-in-law, Rey, to provide a medical update and discuss goals of care. I explained that the patient remains critically ill with worsening respiratory status despite current interventions. We discussed her persistent shortness of breath and inability to safely wean from oxygen support. I shared the patient's own words, that she is suffering and wishes to be comfortable, and that she feels ready to go to heaven.  Willy acknowledged the seriousness of her mother's condition and the limited potential for recovery. Although visibly emotional, both she and Mac expressed agreement with the patient's decision to transition to comfort-focused care. Willy emphasized that her mother has consistently voiced a desire to avoid life-prolonging measures and is at peace with this approach.  I affirmed that, given the high symptom burden, comfort-focused care is the most appropriate path forward. I reviewed the interventions that will be implemented to manage symptoms and maintain dignity and peace. We also discussed the anticipated clinical progression, including the likelihood of hospital death, while also exploring the option of transfer to an inpatient hospice facility. Both Patti and Mac were understanding and appreciative of the conversation.  Created space and opportunity for patient to explore thoughts feelings and fears regarding current medical situation.  Patient and her family face treatment option decisions, advanced directive decisions and anticipatory care needs.   Questions and concerns addressed   Palliative Support Provided.   Objective Assessment: Vital Signs Vitals:   10/28/23 0500 10/28/23 0735  BP: (!) 166/59 (!) 152/73  Pulse: (!) 56 68  Resp: 20 (!) 23  Temp:  97.6 F (36.4 C)  SpO2: 98% 91%    Intake/Output Summary (Last 24 hours) at 10/28/2023 1005 Last data filed at  10/28/2023 0500 Gross per 24 hour  Intake 100 ml  Output 650 ml  Net -550 ml    Last Weight  Most recent update: 10/28/2023  5:50 AM    Weight  75.2 kg (165 lb 12.6 oz)             Physical Exam Vitals and nursing note reviewed.  HENT:     Head:     Comments: Hard of hearing.  Cardiovascular:     Rate and Rhythm: Normal rate.  Pulmonary:     Comments: Shortness of breath on exertion Musculoskeletal:     Comments: Generalized weakness  Neurological:     General: No focal deficit present.     Mental Status: She is alert.  Psychiatric:        Mood and Affect: Mood normal.   SUMMARY OF RECOMMENDATIONS    Recommendations/Plan: Code Status: Transition to DNR-Comfort Anticipate hospital death Continue to provide psycho-social and emotional support to patient and family Palliative medicine team will continue to follow.  Comfort cart for family Unrestricted visitations in the setting of EOL (per policy)  Symptom Management:  Hydromorphone  PRN for pain/air hunger/comfort Robinul PRN for excessive secretions Ativan PRN for agitation/anxiety Zofran  PRN for nausea Liquifilm tears PRN for dry eyes Haldol PRN for agitation/anxiety May have comfort feeding Oxygen PRN 2L or less for comfort. No escalation.    Time Spent: 50 minutes  Detailed review of medical records (labs, imaging, vital signs), medically appropriate exam, discussed with treatment team, counseling and education to patient, family, & staff, documenting clinical information, coordination of care.  ________________________________________________________________________ Kathlyne Bolder NP-C [facility]iative Medicine Team Team Cell Phone: [phone] Please utilize secure chat with additional questions, if there is no response within 30 minutes please call the above phone number  Palliative Medicine Team providers are available by phone from 7am to 7pm daily and can be reached through the team cell phone.  Should this patient require assistance outside of these hours, please call  the patient's attending physician.

## 2023-10-28 NOTE — Progress Notes (Signed)
 Progress Note   Patient: Megan Pitts FMW:[id number] DOB: [birth year] DOA: 10/18/2023     10 DOS: the patient was seen and examined on 10/28/2023   Brief hospital course: Mrs. Ege was admitted to the hospital with the working diagnosis of worsening respiratory failure   89 y.o. female with medical history significant for hypertension, hyperlipidemia, heart failure, dementia, hypothyroidism, atrial fibrillation, Recent hospitalization 8/25 to 8/28 for multifocal pneumonia, she was discharged to LTAC on antibiotic therapy and steroids. Patient required oxygenation with heated high flow nasal cannular up to 45l/min plus non re-breather mask. Eventually she was weaned off to 4 L/min per [place] and was transferred to SNF on 10/08/23.    Now presenting from her SNF for worsening dyspnea and increased oxygen requirement. Apparently she had another round of antibiotic therapy at SNF for pneumonia from 10/2 to 10/08.  Over the last 3 days prior to admission she had worsening dyspnea, her follow up chest radiograph has persistent infiltrates. On the day of admission her 02 saturation was 87% on 4 L.min per [place]. EMS was called, she was found in respiratory distress and patient was transported to the ED.   VBG 7.39/ 43/7/ 160/ 28/ 99%  Na 138, K 3.6 Cl 100 bicarbonate 23, glucose 152 bun 29 cr 1.21  BNP 411 Wbc 18,7 hgb 11.4 plt 276   Sars covid 19 negative Influenza negative RSV negative   Chest radiograph with mild cardiomegaly, bilateral interstitial infiltrates, upper lobes and lower lobes, peripherally.   EKG 80 bpm, normal axis, normal intervals, qtc 431, sinus rhythm with bilateral atrial enlargement, positive PAC, poor RR wave progression, no significant ST segment or T wave changes.   CT showed pulm fibrosis and she is needing high flow oxygen. Palliative consulted.   Patient placed on antibiotics, steroids and diuretics.  Continue to have high 02 requirements.   10/16 continue to require  high flow oxygen with minimal movement.  10/17 worsening respiratory failure, radiograph with worsening infiltrates.  10/18 continue to have worsening dyspnea and increased 02 requirements. Added heated high flow nasal cannula.  10/19 unfortunately not responding to aggressive medical therapy, her family decided to continue care under comfort measures.    Assessment and Plan: Multifocal pneumonia Acute on chronic hypoxemic respiratory failure.  Suspected ILD acute flare.    CT chest with bilateral ground glass opacities, interlobular and intralobular septal thickening, traction bronchiectasis, more upper lobes.  10/18 chest radiograph with bilateral interstitial infiltrates, more dense on the right upper lobe.   Patient was placed on heated high flow nasal cannula, 60 L/min with 100% Fi02 02 saturation 89 to 91%   Patient has failed aggressive medica therapy with high dose systemic corticosteroids, IV antibiotics, and IV diuretics.  Continue to increase work of breathing and 02 requirements.   Decision was made to transition to comfort care.  She has been on bolus opiates and benzodiazepines, but she continue to have respiratory distress.  Plan to start on continuous infusion of morphine  IV, for dyspnea.  Continue supplemental 02 per heated high flow nasal cannula as tolerated.   Poor prognosis, discussed with Pulmonary Palliative care has been consulted.   Acute on chronic diastolic CHF (congestive heart failure) (HCC) Echocardiogram with preserved LV systolic function with EF 60 to 65%, mild LVH, grade II diastolic dysfunction with pseudo normalization EA, RVSP 52.1 mmHg, mild mitral valve regurgitation, moderate mitral valve stenosis, mild to moderate tricuspid valve regurgitation, sp aortic bioprosthetic valve replacement.   Patient failed IV  diuresis Continue comfort care  Paroxysmal atrial fibrillation (HCC) Discontinue diltiazem and anticoagulation Discontinue telemetry    Essential hypertension Transitioned to comfort care  CKD stage 3a, GFR 45-59 ml/min (HCC)  No further blood work, continue comfort care   Hypothyroidism discontinue levothyroxine        Subjective: Patient not feeling well, continue to have increase work of breathing and distress.   Physical Exam: Vitals:   10/27/23 2359 10/28/23 0204 10/28/23 0500 10/28/23 0735  BP: (!) 162/63  (!) 166/59 (!) 152/73  Pulse: 62 (!) 58 (!) 56 68  Resp: 20 18 20  (!) 23  Temp: (!) 97.5 F (36.4 C)   97.6 F (36.4 C)  TempSrc: Oral  Oral Oral  SpO2: 96% 97% 98% 91%  Weight: 75.5 kg  75.2 kg   Height:       Neurology awake, very weak and deconditioned ENT with positive pallor. Heated  high flow nasal cannula in place Cardiovascular with S1 and S2 present and regular with no gallops, or rubs, tachycardic Respiratory with increased work of breathing, accessory muscle use, positive rales bilaterally with no wheezing, scattered rhonchi  Abdomen with no distention, non tender and soft No lower extremity edema  Data Reviewed:    Family Communication: I spoke with patient's daughter at the bedside, we talked in detail about patient's condition, plan of care and prognosis and all questions were addressed.   Disposition: Status is: Inpatient Remains inpatient appropriate because: transition for comfort care  Planned Discharge Destination: imminent death      Author: Elidia Toribio Furnace, MD 10/28/2023 2:39 PM  For on call review [email or web address].

## 2023-10-29 DIAGNOSIS — Z515 Encounter for palliative care: Secondary | ICD-10-CM | POA: Diagnosis not present

## 2023-10-30 LAB — CYCLIC CITRUL PEPTIDE ANTIBODY, IGG/IGA: CCP Antibodies IgG/IgA: 5 U (ref 0–19)

## 2023-11-10 NOTE — Significant Event (Signed)
 Megan Pitts and son-law has left bedside with patient Hearing Aids. Daughter asked for Patient personal pillow to be thrown away.

## 2023-11-10 NOTE — Death Summary Note (Incomplete)
 DEATH SUMMARY   Patient Details  Name: Megan Pitts MRN: [id number] DOB: August 15, 1927 ERE:Amntw, Corean Pollen, MD Admission/Discharge Information   Admit Date:  2023/10/30  Date of Death:  Nov 10, 2023  Time of Death:  10:45  Length of Stay: 11   Principle Cause of death: Pulmonary Fibrosis   Hospital Diagnoses: Active Problems:   Multifocal pneumonia   Acute on chronic diastolic CHF (congestive heart failure) (HCC)   Paroxysmal atrial fibrillation (HCC)   Essential hypertension   CKD stage 3a, GFR 45-59 ml/min [facility])   Hypothyroidism   Hospital Course: [name] was admitted to the hospital with the working diagnosis of worsening respiratory failure   89 y.o. female with medical history significant for hypertension, hyperlipidemia, heart failure, dementia, hypothyroidism, atrial fibrillation, Recent hospitalization 8/25 to 8/28 for multifocal pneumonia, she was discharged to LTAC on antibiotic therapy and steroids. Patient required oxygenation with heated high flow nasal cannular up to 45l/min plus non re-breather mask. Eventually she was weaned off to 4 L/min per [place] and was transferred to SNF on 10/08/23.    Now presenting from her SNF for worsening dyspnea and increased oxygen requirement. Apparently she had another round of antibiotic therapy at SNF for pneumonia from 10/2 to 10/08.  Over the last 3 days prior to admission she had worsening dyspnea, her follow up chest radiograph has persistent infiltrates. On the day of admission her 02 saturation was 87% on 4 L.min per [place]. EMS was called, she was found in respiratory distress and patient was transported to the ED.   VBG 7.39/ 43/7/ 160/ 28/ 99%  Na 138, K 3.6 Cl 100 bicarbonate 23, glucose 152 bun 29 cr 1.21  BNP 411 Wbc 18,7 hgb 11.4 plt 276   Sars covid 19 negative Influenza negative RSV negative   Chest radiograph with mild cardiomegaly, bilateral interstitial infiltrates, upper lobes and lower lobes,  peripherally.   EKG 80 bpm, normal axis, normal intervals, qtc 431, sinus rhythm with bilateral atrial enlargement, positive PAC, poor RR wave progression, no significant ST segment or T wave changes.   CT showed pulm fibrosis and she is needing high flow oxygen. Palliative consulted.   Patient placed on antibiotics, steroids and diuretics.  Continue to have high 02 requirements.   10/16 continue to require high flow oxygen with minimal movement.  10/17 worsening respiratory failure, radiograph with worsening infiltrates.  10/18 continue to have worsening dyspnea and increased 02 requirements. Added heated high flow nasal cannula.  10/19 unfortunately not responding to aggressive medical therapy, her family decided to continue care under comfort measures.    Assessment and Plan: Multifocal pneumonia Acute on chronic hypoxemic respiratory failure.  Suspected ILD acute flare.    CT chest with bilateral ground glass opacities, interlobular and intralobular septal thickening, traction bronchiectasis, more upper lobes.  10/18 chest radiograph with bilateral interstitial infiltrates, more dense on the right upper lobe.   Patient was placed on heated high flow nasal cannula, 60 L/min with 100% Fi02 02 saturation 89 to 91%   Patient has failed aggressive medica therapy with high dose systemic corticosteroids, IV antibiotics, and IV diuretics.  Continue to increase work of breathing and 02 requirements.   Decision was made to transition to comfort care.  She has been on bolus opiates and benzodiazepines, but she continue to have respiratory distress.  Plan to start on continuous infusion of morphine  IV, for dyspnea.  Continue supplemental 02 per heated high flow nasal cannula as tolerated.   Poor  prognosis, discussed with Pulmonary Palliative care has been consulted.   Acute on chronic diastolic CHF (congestive heart failure) (HCC) Echocardiogram with preserved LV systolic function with  EF 60 to 65%, mild LVH, grade II diastolic dysfunction with pseudo normalization EA, RVSP 52.1 mmHg, mild mitral valve regurgitation, moderate mitral valve stenosis, mild to moderate tricuspid valve regurgitation, sp aortic bioprosthetic valve replacement.   Patient failed IV diuresis Continue comfort care  Paroxysmal atrial fibrillation (HCC) Discontinue diltiazem and anticoagulation Discontinue telemetry   Essential hypertension Transitioned to comfort care  CKD stage 3a, GFR 45-59 ml/min (HCC)  No further blood work, continue comfort care   Hypothyroidism discontinue levothyroxine           Procedures: none   Consultations: Pulmonary and Palliative Care   The results of significant diagnostics from this hospitalization (including imaging, microbiology, ancillary and laboratory) are listed below for reference.   Significant Diagnostic Studies: DG CHEST PORT 1 VIEW Result Date: 10/27/2023 EXAM: 1 VIEW XRAY OF THE CHEST 10/27/2023 08:19:22 AM COMPARISON: Compared to prior examination of Oct 25, 2023. CLINICAL HISTORY: Follow-up exam [id number]. Follow up exam FINDINGS: LUNGS AND PLEURA: Progressive pulmonary hypoinflation compared to prior examination. Progressive multifocal asymmetric airspace infiltrate, likely infectious or inflammatory in nature. No pneumothorax. No pleural effusion. HEART AND MEDIASTINUM: Median sternotomy has been performed. Cardiac size within normal limits. BONES AND SOFT TISSUES: No acute osseous abnormality. IMPRESSION: 1. Progressive pulmonary hypoinflation . 2. Progressive multifocal asymmetric airspace infiltrate, likely infectious or inflammatory in nature. Electronically signed by: Dorethia Molt MD 10/27/2023 08:24 AM EDT RP Workstation: [id number]   DG Chest 1 View Result Date: 10/25/2023 EXAM: 1 VIEW XRAY OF THE CHEST 10/25/2023 06:21:18 AM COMPARISON: 10/18/2023 CLINICAL HISTORY: [id number] Follow-up exam [id number]. SOB,HX A-FIB; ROVER. SOB,HX A-FIB;  ROVER. FINDINGS: LINES, TUBES AND DEVICES: Sternotomy wires are noted. LUNGS AND PLEURA: Stable bilateral patchy lung opacities are noted. Possible small pleural effusions. No pneumothorax. HEART AND MEDIASTINUM: Stable cardiomediastinal silhouette. BONES AND SOFT TISSUES: No acute osseous abnormality. IMPRESSION: 1. Stable bilateral patchy lung opacities, which may reflect edema or infection. 2. Possible small pleural effusions. Electronically signed by: Lynwood Seip MD 10/25/2023 08:26 AM EDT RP Workstation: [id number]   CT CHEST WO CONTRAST Result Date: 10/20/2023 EXAM: CT CHEST WITHOUT CONTRAST 10/20/2023 12:18:22 PM TECHNIQUE: CT of the chest was performed without the administration of intravenous contrast. Multiplanar reformatted images are provided for review. Automated exposure control, iterative reconstruction, and/or weight based adjustment of the mA/kV was utilized to reduce the radiation dose to as low as reasonably achievable. COMPARISON: Chest radiographs 10/18/2023 and earlier. CLINICAL HISTORY: 89 year old female with recurrent pneumonia, complication suspected. FINDINGS: MEDIASTINUM: Calcified coronary artery and aortic atherosclerosis. Possible prosthetic mitral valve. Mild cardiomegaly. No pericardial effusion. The central airways are clear. LYMPH NODES: No mediastinal, hilar or axillary lymphadenopathy. LUNGS AND PLEURA: Evidence of chronic lung disease with generalized pleural thickening, subpleural scarring, areas of architectural distortion, and superimposed patchy and confluent peribronchial ground glass and solid opacity in all lobes. Small layering pleural effusions more apparent on the left. Simple fluid density. Major airways remain patent. No overt consolidation. No pneumothorax. SOFT TISSUES/BONES: Previous sternotomy. Mild T3 and mild to moderate T10 superior endplate compression is age indeterminate. No other acute osseous abnormality identified. UPPER ABDOMEN: Limited images of  the upper abdomen demonstrate a small gastrocheidal hernia. IMPRESSION: 1. Abnormal lungs with appearance suggestive of combined acute and chronic lung disease: Consider acute viral/atypical respiratory infection superimposed on developing pulmonary fibrosis. Asymmetric pulmonary  edema also possible, and there are small pleural effusions. 2. Age-indeterminate T3 and T10 compression fractures. Electronically signed by: Helayne Hurst MD 10/20/2023 12:47 PM EDT RP Workstation: [id number]   DG Chest Portable 1 View Result Date: 10/18/2023 CLINICAL DATA:  shob EXAM: PORTABLE CHEST 1 VIEW COMPARISON:  Chest x-ray 10/07/2023 FINDINGS: The heart and mediastinal contours are unchanged. Atherosclerotic plaque. Similar-appearing patchy airspace and interstitial opacities. No pulmonary edema. Possible trace bilateral pleural effusion. No pneumothorax. No acute osseous abnormality. IMPRESSION: 1. Similar-appearing patchy airspace and interstitial opacities. Possible trace bilateral pleural effusion. Findings may represent a combination of infection/inflammation versus edema. 2.  Aortic Atherosclerosis (ICD10-I70.0). Electronically Signed   By: Morgane  Naveau M.D.   On: 10/18/2023 19:21   DG Chest 1 View Result Date: 10/07/2023 EXAM: 1 VIEW(S) XRAY OF THE CHEST 10/07/2023 11:41:00 AM COMPARISON: 09/21/2023 CLINICAL HISTORY: pneumonia FINDINGS: LUNGS AND PLEURA: Low lung volumes. Chronic coarse interstitial opacities consistent with underlying chronic interstitial lung disease. Increasing patchy opacities bilaterally, particularly in left lung. Small bilateral pleural effusions. No pneumothorax. No pulmonary edema. HEART AND MEDIASTINUM: Aortic atherosclerosis. No acute abnormality of the cardiac and mediastinal silhouettes. BONES AND SOFT TISSUES: Prior median sternotomy. No acute osseous abnormality. IMPRESSION: 1. Increasing patchy bilateral air-space opacities, greatest in the left lung, suspicious for pneumonia. 2.  Background changes background changes of chronic fibrotic interstitial lung disease. 3. Small bilateral pleural effusions. Electronically signed by: Waddell Calk MD 10/07/2023 12:05 PM EDT RP Workstation: [id number]    Microbiology: Recent Results (from the past 240 hours)  MRSA Next Gen by PCR, Nasal     Status: None   Collection Time: 10/25/23  6:27 AM   Specimen: Nasal Mucosa; Nasal Swab  Result Value Ref Range Status   MRSA by PCR Next Gen NOT DETECTED NOT DETECTED Final    Comment: (NOTE) The GeneXpert MRSA Assay (FDA approved for NASAL specimens only), is one component of a comprehensive MRSA colonization surveillance program. It is not intended to diagnose MRSA infection nor to guide or monitor treatment for MRSA infections. Test performance is not FDA approved in patients less than 14 years old. Performed at [facility], [address], [place], [place] [address]   Respiratory (~20 pathogens) panel by PCR     Status: None   Collection Time: 10/25/23  3:51 PM   Specimen: Nasopharyngeal Swab; Respiratory  Result Value Ref Range Status   Adenovirus NOT DETECTED NOT DETECTED Final   Coronavirus 229E NOT DETECTED NOT DETECTED Final    Comment: (NOTE) The Coronavirus on the Respiratory Panel, DOES NOT test for the novel  Coronavirus (2019 nCoV)    Coronavirus HKU1 NOT DETECTED NOT DETECTED Final   Coronavirus NL63 NOT DETECTED NOT DETECTED Final   Coronavirus OC43 NOT DETECTED NOT DETECTED Final   Metapneumovirus NOT DETECTED NOT DETECTED Final   Rhinovirus / Enterovirus NOT DETECTED NOT DETECTED Final   Influenza A NOT DETECTED NOT DETECTED Final   Influenza B NOT DETECTED NOT DETECTED Final   Parainfluenza Virus 1 NOT DETECTED NOT DETECTED Final   Parainfluenza Virus 2 NOT DETECTED NOT DETECTED Final   Parainfluenza Virus 3 NOT DETECTED NOT DETECTED Final   Parainfluenza Virus 4 NOT DETECTED NOT DETECTED Final   Respiratory Syncytial Virus NOT DETECTED NOT DETECTED  Final   Bordetella pertussis NOT DETECTED NOT DETECTED Final   Bordetella Parapertussis NOT DETECTED NOT DETECTED Final   Chlamydophila pneumoniae NOT DETECTED NOT DETECTED Final   Mycoplasma pneumoniae NOT DETECTED NOT DETECTED  Final    Comment: Performed at [facility], [address], [place], [place] [address]      Signed: Elidia Toribio Furnace, MD

## 2023-11-10 NOTE — Progress Notes (Signed)
 [facility] Liaison Note   [facility] continues to follow for discharge disposition for outpatient palliative services at home.     Please call with any palliative and hospice related questions or concerns.   Eleanor Nail, LPN [facility] Liaison [phone]

## 2023-11-10 NOTE — Progress Notes (Signed)
 Palliative:  HPI: 89 y.o. female  with past medical history of hypertension, hyperlipidemia, chronic HFpEF, dementia, hypothyroidism, atrial fibrillation on Eliquis, and continued hypoxic respiratory failure since admission for pneumonia in August 2025  admitted on 10/18/2023 with worsening shortness of breath and increased oxygen requirement suspected to be ILD acute flare. PMT has been consulted to assist with goals of care conversation. Patient/Family face treatment option decisions, advanced directive decisions and anticipatory care needs. Transition to full comfort care 10/19.   I met today at Ms. Panameno' bedside along with her son-in-law, Mac. Mac share that his wife, Odetta, has returned home to freshen up and should be back shortly. He tells me how Ms. Moustafa' has been ready for a long time. She has been telling her family and her medical team that she wanted quality of life and was at peace and ready to go. Family have peace with this decision and are able to respect this being her decision and what is best for her.   While Mac and I were reviewing Ms. Bazaldua' health journey I noticed her breathing slow. I assessed her and noted no pulse, no respirations or breath sounds auscultated. Notified son-in-law at bedside and RN came to bedside. Time of death 51. Ms. Yetman' had a peaceful death quietly in her sleep. Mac will remain at bedside and await his wife's return to tell her. I educated on access to chaplain services as needed.   40 min  Bernarda Kitty, NP Palliative Medicine Team Pager [phone] (Please see [email or web address] for schedule) Team Phone [phone]

## 2023-11-10 NOTE — Progress Notes (Signed)
 Pt on comfort care and vitals unavailable

## 2023-11-10 DEATH — deceased

## 2024-03-24 ENCOUNTER — Encounter (HOSPITAL_COMMUNITY)
